# Patient Record
Sex: Male | Born: 1985 | Race: Black or African American | Hispanic: No | Marital: Married | Smoking: Former smoker
Health system: Southern US, Community
[De-identification: ages and names within clinical notes are randomized; demographics above are authoritative.]

## PROBLEM LIST (undated history)

## (undated) DIAGNOSIS — D649 Anemia, unspecified: Secondary | ICD-10-CM

## (undated) DIAGNOSIS — N186 End stage renal disease: Secondary | ICD-10-CM

## (undated) DIAGNOSIS — I639 Cerebral infarction, unspecified: Secondary | ICD-10-CM

## (undated) DIAGNOSIS — E785 Hyperlipidemia, unspecified: Secondary | ICD-10-CM

## (undated) DIAGNOSIS — I10 Essential (primary) hypertension: Secondary | ICD-10-CM

## (undated) DIAGNOSIS — F1410 Cocaine abuse, uncomplicated: Secondary | ICD-10-CM

---
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
## 2005-03-08 ENCOUNTER — Emergency Department (HOSPITAL_COMMUNITY): Admission: EM | Admit: 2005-03-08 | Discharge: 2005-03-08 | Payer: Self-pay | Admitting: Emergency Medicine

## 2014-09-12 ENCOUNTER — Encounter (HOSPITAL_COMMUNITY): Payer: Self-pay | Admitting: Emergency Medicine

## 2014-09-12 ENCOUNTER — Emergency Department (HOSPITAL_COMMUNITY): Payer: Self-pay

## 2014-09-12 ENCOUNTER — Emergency Department (HOSPITAL_COMMUNITY)
Admission: EM | Admit: 2014-09-12 | Discharge: 2014-09-12 | Disposition: A | Discharge status: Home / Self Care | Payer: Self-pay | Attending: Emergency Medicine | Admitting: Emergency Medicine

## 2014-09-12 DIAGNOSIS — W268XXA Contact with other sharp object(s), not elsewhere classified, initial encounter: Secondary | ICD-10-CM | POA: Insufficient documentation

## 2014-09-12 DIAGNOSIS — Z23 Encounter for immunization: Secondary | ICD-10-CM | POA: Insufficient documentation

## 2014-09-12 DIAGNOSIS — F17200 Nicotine dependence, unspecified, uncomplicated: Secondary | ICD-10-CM | POA: Insufficient documentation

## 2014-09-12 DIAGNOSIS — Y9389 Activity, other specified: Secondary | ICD-10-CM | POA: Insufficient documentation

## 2014-09-12 DIAGNOSIS — S61209A Unspecified open wound of unspecified finger without damage to nail, initial encounter: Secondary | ICD-10-CM | POA: Insufficient documentation

## 2014-09-12 DIAGNOSIS — F411 Generalized anxiety disorder: Secondary | ICD-10-CM | POA: Insufficient documentation

## 2014-09-12 DIAGNOSIS — S61412A Laceration without foreign body of left hand, initial encounter: Secondary | ICD-10-CM

## 2014-09-12 DIAGNOSIS — Y9289 Other specified places as the place of occurrence of the external cause: Secondary | ICD-10-CM | POA: Insufficient documentation

## 2014-09-12 DIAGNOSIS — F419 Anxiety disorder, unspecified: Secondary | ICD-10-CM

## 2014-09-12 DIAGNOSIS — R000 Tachycardia, unspecified: Secondary | ICD-10-CM | POA: Insufficient documentation

## 2014-09-12 MED ORDER — IBUPROFEN 200 MG PO TABS
600.0000 mg | ORAL_TABLET | Freq: Once | ORAL | Status: AC
  Administered 2014-09-12: 600 mg via ORAL
  Filled 2014-09-12: qty 3

## 2014-09-12 MED ORDER — LIDOCAINE HCL (PF) 1 % IJ SOLN
5.0000 mL | Freq: Once | INTRAMUSCULAR | Status: AC
  Administered 2014-09-12: 5 mL via INTRADERMAL
  Filled 2014-09-12: qty 5

## 2014-09-12 MED ORDER — TRAMADOL HCL 50 MG PO TABS
50.0000 mg | ORAL_TABLET | Freq: Once | ORAL | Status: AC
  Administered 2014-09-12: 50 mg via ORAL
  Filled 2014-09-12: qty 1

## 2014-09-12 MED ORDER — IBUPROFEN 600 MG PO TABS: 600.0000 mg | ORAL_TABLET | Freq: Four times a day (QID) | ORAL | Status: DC | PRN

## 2014-09-12 MED ORDER — TETANUS-DIPHTH-ACELL PERTUSSIS 5-2.5-18.5 LF-MCG/0.5 IM SUSP
0.5000 mL | Freq: Once | INTRAMUSCULAR | Status: AC
  Administered 2014-09-12: 0.5 mL via INTRAMUSCULAR
  Filled 2014-09-12: qty 0.5

## 2014-09-12 MED ORDER — TRAMADOL HCL 50 MG PO TABS: 50.0000 mg | ORAL_TABLET | Freq: Four times a day (QID) | ORAL | Status: DC | PRN

## 2014-09-12 NOTE — ED Notes (Signed)
Md made aware of high HR; Pt placed on cardiac monitor

## 2014-09-12 NOTE — ED Notes (Signed)
The patient's daughter broke a glass and the patient was picking up the glass and cut himself.  He was taking the trash out and he cut his pinky finger.  The patient rates his pain 9/10.  Finger is still bleeding.

## 2014-09-12 NOTE — ED Provider Notes (Signed)
LACERATION REPAIR Performed by: Cleatrice Burke Authorized by: Cleatrice Burke Consent: Verbal consent obtained. Risks and benefits: risks, benefits and alternatives were discussed Consent given by: patient Patient identity confirmed: provided demographic data Prepped and Draped in normal sterile fashion Wound explored  Laceration Location: left 5th finger  Laceration Length: 2 cm  No Foreign Bodies seen or palpated  Anesthesia: digital block  Local anesthetic: lidocaine 1%  Anesthetic total: 3 ml  Irrigation method: syringe Amount of cleaning: standard  Skin closure: 4-0 Ethilon  Number of sutures: 6  Technique: simple interrupted   Patient tolerance: Patient tolerated the procedure well with no immediate complications.   Elwyn Lade, PA-C 09/12/14 [phone]

## 2014-09-12 NOTE — ED Provider Notes (Signed)
Medical screening examination/treatment/procedure(s) were performed by non-physician practitioner and as supervising physician I was immediately available for consultation/collaboration.   EKG Interpretation None        Julianne Rice, MD 09/12/14 [phone]

## 2014-09-12 NOTE — ED Provider Notes (Signed)
CSN: [id number]     Arrival date & time 09/12/14  0159 History   First MD Initiated Contact with Patient 09/12/14 [phone]     Chief Complaint  Patient presents with  . Laceration    The patient's daughter broke a glass and the patient was picking up the glass and cut himself.     (Consider location/radiation/quality/duration/timing/severity/associated sxs/prior Treatment) HPI Patient states he was pushing down trash in the trashcan when he cut his left fifth digit on a piece of glass. No foreign body sensation. No numbness or decreased range of motion. Unknown last tetanus. Patient admits to being anxious and in pain. Denies chest pain or shortness of breath. History reviewed. No pertinent past medical history. History reviewed. No pertinent past surgical history. History reviewed. No pertinent family history. History  Substance Use Topics  . Smoking status: Current Every Day Smoker -- 1.00 packs/day  . Smokeless tobacco: Never Used  . Alcohol Use: Yes     Comment: 2-3 beers a day    Review of Systems  Constitutional: Negative for fever and chills.  Respiratory: Negative for shortness of breath.   Cardiovascular: Negative for chest pain.  Skin: Positive for wound.  Neurological: Negative for weakness and numbness.  Psychiatric/Behavioral: The patient is nervous/anxious.   All other systems reviewed and are negative.     Allergies  Review of patient's allergies indicates no known allergies.  Home Medications   Prior to Admission medications   Medication Sig Start Date End Date Taking? Authorizing Provider  ibuprofen (ADVIL,MOTRIN) 600 MG tablet Take 1 tablet (600 mg total) by mouth every 6 (six) hours as needed. 09/12/14   Julianne Rice, MD  traMADol (ULTRAM) 50 MG tablet Take 1 tablet (50 mg total) by mouth every 6 (six) hours as needed. 09/12/14   Julianne Rice, MD   BP 162/96  Pulse 122  Temp(Src) 98.6 F (37 C) (Oral)  Resp 13  SpO2 92% Physical Exam  Nursing  note and vitals reviewed. Constitutional: He is oriented to person, place, and time. He appears well-developed and well-nourished. No distress.  Anxious appearing  HENT:  Head: Normocephalic and atraumatic.  Mouth/Throat: Oropharynx is clear and moist. No oropharyngeal exudate.  Eyes: EOM are normal. Pupils are equal, round, and reactive to light.  Neck: Normal range of motion. Neck supple.  Cardiovascular: Normal rate and regular rhythm.   Pulmonary/Chest: Effort normal and breath sounds normal. No respiratory distress. He has no wheezes. He has no rales. He exhibits no tenderness.  Abdominal: Soft. Bowel sounds are normal. He exhibits no distension and no mass. There is no tenderness. There is no rebound and no guarding.  Musculoskeletal: Normal range of motion. He exhibits no edema and no tenderness.  Neurological: He is alert and oriented to person, place, and time.  No numbness. Patient has full extension and flexion of the fifth digit of the left hand.  Skin: Skin is warm and dry. No rash noted. No erythema.  Linear laceration parallel to the axis of the fifth digit. Roughly 2 cm in length and extending from the middle phalanx to the distal phalanx on the volar surface of the left hand. No foreign body appreciated. Mild oozing but no arterial bleeding  Psychiatric: He has a normal mood and affect. His behavior is normal.    ED Course  Procedures (including critical care time) Labs Review Labs Reviewed - No data to display  Imaging Review Dg Hand Complete Left  09/12/2014   CLINICAL DATA:  Laceration on the distal aspect of the little finger from a broken glass.  EXAM: LEFT HAND - COMPLETE 3+ VIEW  COMPARISON:  None.  FINDINGS: There is no evidence of fracture or dislocation. There is no evidence of arthropathy or other focal bone abnormality. Soft tissues are unremarkable. No radiopaque soft tissue foreign bodies.  IMPRESSION: Negative.   Electronically Signed   By: Lucienne Capers  M.D.   On: 09/12/2014 03:21     EKG Interpretation None      MDM   Final diagnoses:  Hand laceration, left, initial encounter  Anxiety  Tachycardia    Laceration repaired by PA. Patient continues to have full sensation and range of motion of the fifth digit of his left hand. Given thorough return precautions including signs of infection and tendon injury. He understands he needs to return immediately for any evidence of either. Tetanus update in the ED.    Julianne Rice, MD 09/12/14 [phone]

## 2014-09-12 NOTE — Discharge Instructions (Signed)
You need to be seen immediately either in the emergency department if for any reason you develope numbness or inability to extend or flex the finger that is lacerated. He also need to be seen immediately if you develop redness and warmth at the site of the injury. Your sutures need to be removed in 7 days.  Laceration Care, Adult A laceration is a cut or lesion that goes through all layers of the skin and into the tissue just beneath the skin. TREATMENT  Some lacerations may not require closure. Some lacerations may not be able to be closed due to an increased risk of infection. It is important to see your caregiver as soon as possible after an injury to minimize the risk of infection and maximize the opportunity for successful closure. If closure is appropriate, pain medicines may be given, if needed. The wound will be cleaned to help prevent infection. Your caregiver will use stitches (sutures), staples, wound glue (adhesive), or skin adhesive strips to repair the laceration. These tools bring the skin edges together to allow for faster healing and a better cosmetic outcome. However, all wounds will heal with a scar. Once the wound has healed, scarring can be minimized by covering the wound with sunscreen during the day for 1 full year. HOME CARE INSTRUCTIONS  For sutures or staples:  Keep the wound clean and dry.  If you were given a bandage (dressing), you should change it at least once a day. Also, change the dressing if it becomes wet or dirty, or as directed by your caregiver.  Wash the wound with soap and water 2 times a day. Rinse the wound off with water to remove all soap. Pat the wound dry with a clean towel.  After cleaning, apply a thin layer of the antibiotic ointment as recommended by your caregiver. This will help prevent infection and keep the dressing from sticking.  You may shower as usual after the first 24 hours. Do not soak the wound in water until the sutures are  removed.  Only take over-the-counter or prescription medicines for pain, discomfort, or fever as directed by your caregiver.  Get your sutures or staples removed as directed by your caregiver. For skin adhesive strips:  Keep the wound clean and dry.  Do not get the skin adhesive strips wet. You may bathe carefully, using caution to keep the wound dry.  If the wound gets wet, pat it dry with a clean towel.  Skin adhesive strips will fall off on their own. You may trim the strips as the wound heals. Do not remove skin adhesive strips that are still stuck to the wound. They will fall off in time. For wound adhesive:  You may briefly wet your wound in the shower or bath. Do not soak or scrub the wound. Do not swim. Avoid periods of heavy perspiration until the skin adhesive has fallen off on its own. After showering or bathing, gently pat the wound dry with a clean towel.  Do not apply liquid medicine, cream medicine, or ointment medicine to your wound while the skin adhesive is in place. This may loosen the film before your wound is healed.  If a dressing is placed over the wound, be careful not to apply tape directly over the skin adhesive. This may cause the adhesive to be pulled off before the wound is healed.  Avoid prolonged exposure to sunlight or tanning lamps while the skin adhesive is in place. Exposure to ultraviolet light in the  first year will darken the scar.  The skin adhesive will usually remain in place for 5 to 10 days, then naturally fall off the skin. Do not pick at the adhesive film. You may need a tetanus shot if:  You cannot remember when you had your last tetanus shot.  You have never had a tetanus shot. If you get a tetanus shot, your arm may swell, get red, and feel warm to the touch. This is common and not a problem. If you need a tetanus shot and you choose not to have one, there is a rare chance of getting tetanus. Sickness from tetanus can be serious. SEEK  MEDICAL CARE IF:   You have redness, swelling, or increasing pain in the wound.  You see a red line that goes away from the wound.  You have yellowish-white fluid (pus) coming from the wound.  You have a fever.  You notice a bad smell coming from the wound or dressing.  Your wound breaks open before or after sutures have been removed.  You notice something coming out of the wound such as wood or glass.  Your wound is on your hand or foot and you cannot move a finger or toe. SEEK IMMEDIATE MEDICAL CARE IF:   Your pain is not controlled with prescribed medicine.  You have severe swelling around the wound causing pain and numbness or a change in color in your arm, hand, leg, or foot.  Your wound splits open and starts bleeding.  You have worsening numbness, weakness, or loss of function of any joint around or beyond the wound.  You develop painful lumps near the wound or on the skin anywhere on your body. MAKE SURE YOU:   Understand these instructions.  Will watch your condition.  Will get help right away if you are not doing well or get worse. Document Released: 11/30/2005 Document Revised: 02/22/2012 Document Reviewed: 05/26/2011 [facility] Patient Information 2015 [place], [place]. This information is not intended to replace advice given to you by your health care provider. Make sure you discuss any questions you have with your health care provider.

## 2014-09-12 NOTE — ED Notes (Signed)
Suture cart set up at bedside

## 2017-12-17 ENCOUNTER — Other Ambulatory Visit: Payer: Self-pay

## 2017-12-17 ENCOUNTER — Encounter (HOSPITAL_COMMUNITY): Payer: Self-pay | Admitting: *Deleted

## 2017-12-17 ENCOUNTER — Emergency Department (HOSPITAL_COMMUNITY)
Admission: EM | Admit: 2017-12-17 | Discharge: 2017-12-17 | Disposition: A | Discharge status: Home / Self Care | Payer: Self-pay | Attending: Emergency Medicine | Admitting: Emergency Medicine

## 2017-12-17 DIAGNOSIS — F17200 Nicotine dependence, unspecified, uncomplicated: Secondary | ICD-10-CM | POA: Insufficient documentation

## 2017-12-17 DIAGNOSIS — M546 Pain in thoracic spine: Secondary | ICD-10-CM | POA: Insufficient documentation

## 2017-12-17 DIAGNOSIS — I10 Essential (primary) hypertension: Secondary | ICD-10-CM | POA: Insufficient documentation

## 2017-12-17 HISTORY — DX: Essential (primary) hypertension: I10

## 2017-12-17 LAB — BASIC METABOLIC PANEL
Anion gap: 10 (ref 5–15)
BUN: <5 mg/dL — ABNORMAL LOW (ref 6–20)
CO2: 27 mmol/L (ref 22–32)
Calcium: 9.4 mg/dL (ref 8.9–10.3)
Chloride: 97 mmol/L — ABNORMAL LOW (ref 101–111)
Creatinine, Ser: 1.12 mg/dL (ref 0.61–1.24)
GFR calc Af Amer: >60 mL/min (ref >60)
GFR calc non Af Amer: >60 mL/min (ref >60)
Glucose, Bld: 103 mg/dL — ABNORMAL HIGH (ref 65–99)
POTASSIUM: 3.4 mmol/L — AB (ref 3.5–5.1)
SODIUM: 134 mmol/L — AB (ref 135–145)

## 2017-12-17 LAB — URINALYSIS, ROUTINE W REFLEX MICROSCOPIC
Bacteria, UA: NONE SEEN
Bilirubin Urine: NEGATIVE
Glucose, UA: 150 mg/dL — AB
KETONES UR: NEGATIVE mg/dL
Leukocytes, UA: NEGATIVE
Nitrite: NEGATIVE
PH: 6 (ref 5.0–8.0)
Protein, ur: 100 mg/dL — AB
SQUAMOUS EPITHELIAL / LPF: NONE SEEN
Specific Gravity, Urine: 1.019 (ref 1.005–1.030)

## 2017-12-17 LAB — CBC
HEMATOCRIT: 44.7 % (ref 39.0–52.0)
HEMOGLOBIN: 15 g/dL (ref 13.0–17.0)
MCH: 32.1 pg (ref 26.0–34.0)
MCHC: 33.6 g/dL (ref 30.0–36.0)
MCV: 95.7 fL (ref 78.0–100.0)
Platelets: 269 10*3/uL (ref 150–400)
RBC: 4.67 MIL/uL (ref 4.22–5.81)
RDW: 12.3 % (ref 11.5–15.5)
WBC: 9.3 10*3/uL (ref 4.0–10.5)

## 2017-12-17 MED ORDER — ACETAMINOPHEN 500 MG PO TABS: 1000.0000 mg | ORAL_TABLET | Freq: Three times a day (TID) | ORAL | 0 refills | Status: DC | PRN

## 2017-12-17 MED ORDER — CYCLOBENZAPRINE HCL 5 MG PO TABS: 5.0000 mg | ORAL_TABLET | Freq: Two times a day (BID) | ORAL | 0 refills | Status: DC | PRN

## 2017-12-17 MED ORDER — LISINOPRIL 10 MG PO TABS: 10.0000 mg | ORAL_TABLET | Freq: Every day | ORAL | 0 refills | Status: DC

## 2017-12-17 MED ORDER — ACETAMINOPHEN 500 MG PO TABS
1000.0000 mg | ORAL_TABLET | Freq: Once | ORAL | Status: AC
Start: 2017-12-17 — End: 2017-12-17
  Administered 2017-12-17: 1000 mg via ORAL
  Filled 2017-12-17: qty 2

## 2017-12-17 MED ORDER — OXYCODONE-ACETAMINOPHEN 5-325 MG PO TABS
1.0000 | ORAL_TABLET | ORAL | Status: DC | PRN
  Administered 2017-12-17: 1 via ORAL
  Filled 2017-12-17: qty 1

## 2017-12-17 NOTE — ED Provider Notes (Signed)
[facility] EMERGENCY DEPARTMENT Provider Note   CSN: [id number] Arrival date & time: 12/17/17  0700     History   Chief Complaint Chief Complaint  Patient presents with  . Back Pain    HPI William [name] is a 32 y.o. male presenting for evaluation of back pain.  Patient states that he woke up 2 mornings ago with right-sided back pain that extends from low back to the shoulder blade.  Pain is described as a sharp shooting pain that is worse with movement.  Nothing makes it better.  He denies fall, trauma, or injury.  Twisting and movement of his right arm makes it worse.  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  He denies fevers, chills, chest pain, shortness breath, nausea, vomiting, abdominal pain, urinary symptoms, abnormal bowel movements.  He denies other red flags of back pain including history of cancer, history of IV drug use, loss of bowel or bladder control, numbness, or tingling.  He reports a similar episode several years ago, does not know what prompted it or help to go away. He has not taken anything for pain at home including tylenol or ibuprofen.  Additionally, patient reports a history of high blood pressure.  He has not been on medication for the past 2 years.  He denies vision changes, headaches, slurred speech, weakness.   HPI  Past Medical History:  Diagnosis Date  . Hypertension     There are no active problems to display for this patient.   History reviewed. No pertinent surgical history.     Home Medications    Prior to Admission medications   Medication Sig Start Date End Date Taking? Authorizing Provider  acetaminophen (TYLENOL) 500 MG tablet Take 2 tablets (1,000 mg total) by mouth every 8 (eight) hours as needed. 12/17/17   Abdoul Encinas, PA-C  cyclobenzaprine (FLEXERIL) 5 MG tablet Take 1 tablet (5 mg total) by mouth 2 (two) times daily as needed for muscle spasms.  12/17/17   Shawana Knoch, PA-C  ibuprofen (ADVIL,MOTRIN) 600 MG tablet Take 1 tablet (600 mg total) by mouth every 6 (six) hours as needed. 09/12/14   Julianne Rice, MD  lisinopril (PRINIVIL,ZESTRIL) 10 MG tablet Take 1 tablet (10 mg total) by mouth daily. 12/17/17   Kelleen Stolze, PA-C  traMADol (ULTRAM) 50 MG tablet Take 1 tablet (50 mg total) by mouth every 6 (six) hours as needed. 09/12/14   Julianne Rice, MD    Family History History reviewed. No pertinent family history.  Social History Social History   Tobacco Use  . Smoking status: Current Every Day Smoker    Packs/day: 1.00  . Smokeless tobacco: Never Used  Substance Use Topics  . Alcohol use: Yes    Comment: 2-3 beers a day  . Drug use: Yes    Types: Marijuana     Allergies   Patient has no known allergies.   Review of Systems Review of Systems  Musculoskeletal: Positive for back pain.  Allergic/Immunologic: Negative for immunocompromised state.  Neurological: Negative for numbness.  Hematological: Does not bruise/bleed easily.     Physical Exam Updated Vital Signs BP (!) 191/122 (BP Location: Right Arm)   Pulse 85   Temp 98 F (36.7 C) (Oral)   Resp 16   SpO2 100%   Physical Exam  Constitutional: He is oriented to person, place, and time. He appears well-developed and well-nourished. No distress.  HENT:  Head: Normocephalic and atraumatic.  Eyes: EOM are normal.  Neck: Normal range of motion.  Cardiovascular: Normal rate, regular rhythm and intact distal pulses.  Pulmonary/Chest: Effort normal and breath sounds normal. No respiratory distress. He has no wheezes.      Abdominal: He exhibits no distension.  Musculoskeletal: Normal range of motion. He exhibits tenderness.  Tenderness to palpation of the right-sided back musculature.  No tenderness to palpation over midline spine or left-sided musculature.  Full active range of motion of arm, neck, and back with minimal pain.  Patient is  ambulatory.  Neurological: He is alert and oriented to person, place, and time. No sensory deficit.  Skin: Skin is warm. No rash noted.  Psychiatric: He has a normal mood and affect.  Nursing note and vitals reviewed.    ED Treatments / Results  Labs (all labs ordered are listed, but only abnormal results are displayed) Labs Reviewed  URINALYSIS, ROUTINE W REFLEX MICROSCOPIC - Abnormal; Notable for the following components:      Result Value   Glucose, UA 150 (*)    Hgb urine dipstick SMALL (*)    Protein, ur 100 (*)    All other components within normal limits  BASIC METABOLIC PANEL - Abnormal; Notable for the following components:   Sodium 134 (*)    Potassium 3.4 (*)    Chloride 97 (*)    Glucose, Bld 103 (*)    BUN <5 (*)    All other components within normal limits  CBC    EKG  EKG Interpretation None       Radiology No results found.  Procedures Procedures (including critical care time)  Medications Ordered in ED Medications  acetaminophen (TYLENOL) tablet 1,000 mg (1,000 mg Oral Given 12/17/17 1026)     Initial Impression / Assessment and Plan / ED Course  I have reviewed the triage vital signs and the nursing notes.  Pertinent labs & imaging results that were available during my care of the patient were reviewed by me and considered in my medical decision making (see chart for details).     Pt presenting with R sided back pain. Physical exam with TTP of musculature, reassuring. No red flags for back pain. However, BP is elevated. Will obtain urine and BMP for further evaluation of kidneys. If normal, likely MSK pain that can be tx with NSAIDs and muscle relaxer. BP elevated, improved slightly while in ER. Likely baseline, as pt is supposed to be on medication and isn't. Has been 2 years since medication, Will encourage PCP f/u.   Urine shows small blood, no signs of infection.  Doubt kidney stone.  Creatinine reassuring.  No sign of endorgan damage due to  blood pressure at this time. Will give low dose lisinopril until he can f/u with PCP. Discussed findings with patient.  Will treat for muscular pain.  Patient given information for follow-up for primary care.  Patient told to avoid NSAIDs due to blood pressure.  At this time, patient appears safe for discharge.  Return precautions given.  Patient states he understands and agrees to plan.   Final Clinical Impressions(s) / ED Diagnoses   Final diagnoses:  Acute right-sided thoracic back pain  Hypertension, unspecified type    ED Discharge Orders        Ordered    acetaminophen (TYLENOL) 500 MG tablet  Every 8 hours PRN     12/17/17 1145    cyclobenzaprine (FLEXERIL) 5 MG tablet  2 times daily PRN  12/17/17 1145    lisinopril (PRINIVIL,ZESTRIL) 10 MG tablet  Daily     12/17/17 1145       Tavin Vernet, PA-C 12/17/17 1528    Isla Pence, MD 12/17/17 [phone]

## 2017-12-17 NOTE — Discharge Instructions (Signed)
Take Tylenol 3 times a day as needed for pain.  You may use 2 ibuprofen as needed for severe or breakthrough pain. Take muscle relaxer as needed at night for muscle spasms.  Have caution, as this may make you tired or groggy.  Do not drive or operate heavy machinery while taking this medicine. Take lisinopril once a day.  If you are feeling dizzy or weak, stop taking medication and follow-up with primary care. It is very important that you establish care with a primary care doctor.  I have provided information for [place] and wellness, or you may call the [phone] number the back of the paperwork to help you set up primary care. Return to the emergency room if you develop numbness, loss of bowel or bladder control, fevers, slurred speech, vision changes, chest pain, or any new or concerning symptoms.

## 2017-12-17 NOTE — ED Notes (Signed)
Patient states that at one time he was on lisinopril for his blood pressure. He has not had any bp meds in a while. Pt is extremely hypertensive upon arrival to dept. Patient educated about consequences of unmanaged blood pressure. Nursing to provide resources upon discharge for primary care md.

## 2017-12-17 NOTE — ED Triage Notes (Addendum)
Pt woke up with mid right side back pain two days ago. Increases with movement. Unsure of any injury. Ambulatory at triage.hypertensive at triage, reports hx of same but is out of his meds.

## 2019-06-14 DIAGNOSIS — I639 Cerebral infarction, unspecified: Secondary | ICD-10-CM

## 2019-06-14 HISTORY — DX: Cerebral infarction, unspecified: I63.9

## 2019-06-21 ENCOUNTER — Encounter (HOSPITAL_COMMUNITY): Payer: Self-pay

## 2019-06-21 ENCOUNTER — Inpatient Hospital Stay (HOSPITAL_COMMUNITY)
Admission: EM | Admit: 2019-06-21 | Discharge: 2019-06-29 | DRG: 064 | Disposition: A | Discharge status: Rehabilitation Facility | Payer: Medicaid Other | Attending: Internal Medicine | Admitting: Internal Medicine

## 2019-06-21 ENCOUNTER — Emergency Department (HOSPITAL_COMMUNITY): Payer: Medicaid Other

## 2019-06-21 ENCOUNTER — Other Ambulatory Visit: Payer: Self-pay

## 2019-06-21 DIAGNOSIS — I119 Hypertensive heart disease without heart failure: Secondary | ICD-10-CM | POA: Diagnosis present

## 2019-06-21 DIAGNOSIS — I161 Hypertensive emergency: Secondary | ICD-10-CM

## 2019-06-21 DIAGNOSIS — I472 Ventricular tachycardia: Secondary | ICD-10-CM | POA: Diagnosis present

## 2019-06-21 DIAGNOSIS — Z20828 Contact with and (suspected) exposure to other viral communicable diseases: Secondary | ICD-10-CM | POA: Diagnosis present

## 2019-06-21 DIAGNOSIS — I6330 Cerebral infarction due to thrombosis of unspecified cerebral artery: Secondary | ICD-10-CM | POA: Insufficient documentation

## 2019-06-21 DIAGNOSIS — I6783 Posterior reversible encephalopathy syndrome: Secondary | ICD-10-CM | POA: Diagnosis present

## 2019-06-21 DIAGNOSIS — E785 Hyperlipidemia, unspecified: Secondary | ICD-10-CM | POA: Diagnosis present

## 2019-06-21 DIAGNOSIS — F1310 Sedative, hypnotic or anxiolytic abuse, uncomplicated: Secondary | ICD-10-CM | POA: Diagnosis present

## 2019-06-21 DIAGNOSIS — K5900 Constipation, unspecified: Secondary | ICD-10-CM | POA: Diagnosis present

## 2019-06-21 DIAGNOSIS — I674 Hypertensive encephalopathy: Secondary | ICD-10-CM | POA: Diagnosis present

## 2019-06-21 DIAGNOSIS — Z79891 Long term (current) use of opiate analgesic: Secondary | ICD-10-CM

## 2019-06-21 DIAGNOSIS — Z888 Allergy status to other drugs, medicaments and biological substances status: Secondary | ICD-10-CM

## 2019-06-21 DIAGNOSIS — Z79899 Other long term (current) drug therapy: Secondary | ICD-10-CM

## 2019-06-21 DIAGNOSIS — F1210 Cannabis abuse, uncomplicated: Secondary | ICD-10-CM | POA: Diagnosis present

## 2019-06-21 DIAGNOSIS — I6389 Other cerebral infarction: Principal | ICD-10-CM | POA: Diagnosis present

## 2019-06-21 DIAGNOSIS — E876 Hypokalemia: Secondary | ICD-10-CM | POA: Diagnosis present

## 2019-06-21 DIAGNOSIS — I248 Other forms of acute ischemic heart disease: Secondary | ICD-10-CM | POA: Diagnosis present

## 2019-06-21 DIAGNOSIS — G936 Cerebral edema: Secondary | ICD-10-CM | POA: Diagnosis present

## 2019-06-21 DIAGNOSIS — I10 Essential (primary) hypertension: Secondary | ICD-10-CM

## 2019-06-21 DIAGNOSIS — I313 Pericardial effusion (noninflammatory): Secondary | ICD-10-CM | POA: Diagnosis present

## 2019-06-21 DIAGNOSIS — G8194 Hemiplegia, unspecified affecting left nondominant side: Secondary | ICD-10-CM | POA: Diagnosis present

## 2019-06-21 DIAGNOSIS — H5347 Heteronymous bilateral field defects: Secondary | ICD-10-CM | POA: Diagnosis present

## 2019-06-21 DIAGNOSIS — E871 Hypo-osmolality and hyponatremia: Secondary | ICD-10-CM | POA: Diagnosis present

## 2019-06-21 DIAGNOSIS — F1410 Cocaine abuse, uncomplicated: Secondary | ICD-10-CM | POA: Diagnosis present

## 2019-06-21 DIAGNOSIS — Z9119 Patient's noncompliance with other medical treatment and regimen: Secondary | ICD-10-CM

## 2019-06-21 DIAGNOSIS — D649 Anemia, unspecified: Secondary | ICD-10-CM | POA: Diagnosis present

## 2019-06-21 DIAGNOSIS — N179 Acute kidney failure, unspecified: Secondary | ICD-10-CM

## 2019-06-21 DIAGNOSIS — N280 Ischemia and infarction of kidney: Secondary | ICD-10-CM | POA: Diagnosis present

## 2019-06-21 DIAGNOSIS — F17210 Nicotine dependence, cigarettes, uncomplicated: Secondary | ICD-10-CM | POA: Diagnosis present

## 2019-06-21 DIAGNOSIS — I160 Hypertensive urgency: Secondary | ICD-10-CM | POA: Diagnosis present

## 2019-06-21 HISTORY — DX: Hyperlipidemia, unspecified: E78.5

## 2019-06-21 MED ORDER — HYDRALAZINE HCL 20 MG/ML IJ SOLN
10.0000 mg | Freq: Once | INTRAMUSCULAR | Status: AC
  Administered 2019-06-21: 10 mg via INTRAVENOUS
  Filled 2019-06-21: qty 1

## 2019-06-21 NOTE — ED Notes (Signed)
Pt is concerned about his blood pressure. Pt states it has never been this high (254/167)

## 2019-06-21 NOTE — ED Notes (Signed)
Pt stated that he is out of his BP medicine but is on meds for it

## 2019-06-21 NOTE — ED Provider Notes (Signed)
TIME SEEN: 11:30 PM  CHIEF COMPLAINT: Chest pain, shortness of breath, abdominal discomfort  HPI: Patient is a 33 year old male with history of hypertension who was previously on lisinopril and a second antihypertensive medication but states he has not been taking them for over 6 months who presents today with mild chest tightness, shortness of breath for the past several days.  Has had some intermittent headaches that have been very mild but no headache currently.  No blurry vision.  No numbness or weakness.  Patient is extremely hypertensive here.  States his blood pressure has never been this elevated.  ROS: See HPI Constitutional: no fever  Eyes: no drainage  ENT: no runny nose   Cardiovascular:   chest pain  Resp:  SOB  GI: no vomiting GU: no dysuria Integumentary: no rash  Allergy: no hives  Musculoskeletal: no leg swelling  Neurological: no slurred speech ROS otherwise negative  PAST MEDICAL HISTORY/PAST SURGICAL HISTORY:  Past Medical History:  Diagnosis Date  . Hypertension     MEDICATIONS:  Prior to Admission medications   Medication Sig Start Date End Date Taking? Authorizing Provider  acetaminophen (TYLENOL) 500 MG tablet Take 2 tablets (1,000 mg total) by mouth every 8 (eight) hours as needed. 12/17/17   Caccavale, Sophia, PA-C  cyclobenzaprine (FLEXERIL) 5 MG tablet Take 1 tablet (5 mg total) by mouth 2 (two) times daily as needed for muscle spasms. 12/17/17   Caccavale, Sophia, PA-C  ibuprofen (ADVIL,MOTRIN) 600 MG tablet Take 1 tablet (600 mg total) by mouth every 6 (six) hours as needed. 09/12/14   Julianne Rice, MD  lisinopril (PRINIVIL,ZESTRIL) 10 MG tablet Take 1 tablet (10 mg total) by mouth daily. 12/17/17   Caccavale, Sophia, PA-C  traMADol (ULTRAM) 50 MG tablet Take 1 tablet (50 mg total) by mouth every 6 (six) hours as needed. 09/12/14   Julianne Rice, MD    ALLERGIES:  No Known Allergies  SOCIAL HISTORY:  Social History   Tobacco Use  . Smoking  status: Current Every Day Smoker    Packs/day: 1.00  . Smokeless tobacco: Never Used  Substance Use Topics  . Alcohol use: Yes    Comment: 2-3 beers a day    FAMILY HISTORY: No family history on file.  EXAM: BP (!) 237/171 Comment: left arm   Pulse (!) 101 Comment: left arm   Temp 98.5 F (36.9 C) (Oral)   Resp 17 Comment: left arm   Ht 5\' 9"  (1.753 m)   Wt 99.8 kg   SpO2 100% Comment: left arm   BMI 32.49 kg/m  CONSTITUTIONAL: Alert and oriented and responds appropriately to questions. Well-appearing; well-nourished HEAD: Normocephalic EYES: Conjunctivae clear, pupils appear equal, EOMI ENT: normal nose; moist mucous membranes NECK: Supple, no meningismus, no nuchal rigidity, no LAD  CARD: RRR; S1 and S2 appreciated; no murmurs, no clicks, no rubs, no gallops RESP: Normal chest excursion without splinting or tachypnea; breath sounds clear and equal bilaterally; no wheezes, no rhonchi, no rales, no hypoxia or respiratory distress, speaking full sentences ABD/GI: Normal bowel sounds; non-distended; soft, non-tender, no rebound, no guarding, no peritoneal signs, no hepatosplenomegaly BACK:  The back appears normal and is non-tender to palpation, there is no CVA tenderness EXT: Normal ROM in all joints; non-tender to palpation; no edema; normal capillary refill; no cyanosis, no calf tenderness or swelling    SKIN: Normal color for age and race; warm; no rash NEURO: Moves all extremities equally sensation to light touch intact diffusely, cranial nerves II  through XII intact, normal speech  pSYCH: The patient's mood and manner are appropriate. Grooming and personal hygiene are appropriate.  MEDICAL DECISION MAKING: Patient here with likely hypertensive urgency/emergency.  Chest x-ray shows normal cardiac silhouette and no widened mediastinum.  EKG does show ischemic abnormalities in the inferior leads with no old for comparison.  Will give IV hydralazine.  Patient is high risk for  dissection, aneurysm, stroke, ACS.  Doubt PE.  Will obtain cardiac labs.  I feel he will need a CT scan to rule out dissection.  He denies any headache or neurologic deficits at this time.  I feel patient will need admission.  ED PROGRESS: IV hydralazine as needed has not significantly improved patient's blood pressure.  I feel we will need to lower his blood pressure by at least 97% to a systolic of 026.  Will start on IV Cardene drip.  He does endorse recent cocaine use.  At this time will avoid beta-blockers.  He is complaining of headache currently.  Will give Reglan, Benadryl for symptomatic relief and obtain CT of his head.  He denies any other stimulant use including methamphetamine.  Patient's creatinine is elevated at 4.95.  Troponin also slightly elevated at 44.  Unable to obtain a dissection study at this time given his significantly elevated creatinine.  I worry that if he receives contrast, he will end up on dialysis.  We have had lengthy discussion regarding this and he agrees he would like to hold off on CT imaging.  He states his chest pain is only mild tightness and no pain that goes into his back or abdomen.  He denies any tearing sensation.  He has normal neurologic exam and equal pulses in all extremities and equal blood pressures on both arms.  Will work on aggressive blood pressure control and obtain CT of the head.  Will discuss with ICU for admission.  2:23 AM  Discussed with Dr. Jimmy Footman with [facility].  Critical care team will see patient in the ED.  He is tachycardic here but given recent cocaine use, avoiding beta blockers.  Blood pressure improving with IV Cardene drip.   Patient has become very restless after given IV Reglan for headache.  Suspect akathisia.  Will give IV Ativan.  Head CT shows no acute hemorrhage.  He does have patchy areas of low density in the bilateral caudate, periventricular white matter and right thalamus that are nonspecific.  He has no focal neurologic  deficits at this time.  Radiology recommended MRI to be considered for further evaluation.  I do not feel he needs this emergently and can be ordered as needed by the admitting team.   I reviewed all nursing notes, vitals, pertinent previous records, EKGs, lab and urine results, imaging (as available).   EKG Interpretation  Date/Time:  Wednesday June 21 2019 16:56:44 EDT Ventricular Rate:  112 PR Interval:  146 QRS Duration: 82 QT Interval:  334 QTC Calculation: 455 R Axis:   93 Text Interpretation:  Sinus tachycardia Right atrial enlargement Rightward axis Left ventricular hypertrophy ST & T wave abnormality, consider inferior ischemia Abnormal ECG No old tracing to compare Confirmed by Deno Etienne [phone]) on 06/21/2019 10:27:29 PM        CRITICAL CARE Performed by: Cyril Mourning Travoris Bushey   Total critical care time: 65 minutes  Critical care time was exclusive of separately billable procedures and treating other patients.  Critical care was necessary to treat or prevent imminent or life-threatening deterioration.  Critical care  was time spent personally by me on the following activities: development of treatment plan with patient and/or surrogate as well as nursing, discussions with consultants, evaluation of patient's response to treatment, examination of patient, obtaining history from patient or surrogate, ordering and performing treatments and interventions, ordering and review of laboratory studies, ordering and review of radiographic studies, pulse oximetry and re-evaluation of patient's condition.    Dayannara Pascal, Delice Bison, DO 06/22/19 [phone]

## 2019-06-21 NOTE — ED Triage Notes (Signed)
Pt reports multiple complaints, SOB,constipation, unable to eat for the past 3 days. Denies fever/cough. Pt a.o, nad noted.

## 2019-06-22 ENCOUNTER — Inpatient Hospital Stay (HOSPITAL_COMMUNITY): Payer: Medicaid Other

## 2019-06-22 ENCOUNTER — Emergency Department (HOSPITAL_COMMUNITY): Payer: Medicaid Other

## 2019-06-22 ENCOUNTER — Encounter (HOSPITAL_COMMUNITY): Payer: Self-pay | Admitting: Radiology

## 2019-06-22 DIAGNOSIS — F1910 Other psychoactive substance abuse, uncomplicated: Secondary | ICD-10-CM | POA: Diagnosis not present

## 2019-06-22 DIAGNOSIS — I160 Hypertensive urgency: Secondary | ICD-10-CM | POA: Diagnosis present

## 2019-06-22 DIAGNOSIS — I639 Cerebral infarction, unspecified: Secondary | ICD-10-CM

## 2019-06-22 DIAGNOSIS — I313 Pericardial effusion (noninflammatory): Secondary | ICD-10-CM | POA: Diagnosis present

## 2019-06-22 DIAGNOSIS — N280 Ischemia and infarction of kidney: Secondary | ICD-10-CM | POA: Diagnosis present

## 2019-06-22 DIAGNOSIS — I161 Hypertensive emergency: Secondary | ICD-10-CM | POA: Diagnosis not present

## 2019-06-22 DIAGNOSIS — I674 Hypertensive encephalopathy: Secondary | ICD-10-CM | POA: Diagnosis present

## 2019-06-22 DIAGNOSIS — F1210 Cannabis abuse, uncomplicated: Secondary | ICD-10-CM | POA: Diagnosis present

## 2019-06-22 DIAGNOSIS — N179 Acute kidney failure, unspecified: Secondary | ICD-10-CM

## 2019-06-22 DIAGNOSIS — R0602 Shortness of breath: Secondary | ICD-10-CM | POA: Diagnosis not present

## 2019-06-22 DIAGNOSIS — F1410 Cocaine abuse, uncomplicated: Secondary | ICD-10-CM | POA: Diagnosis present

## 2019-06-22 DIAGNOSIS — K5900 Constipation, unspecified: Secondary | ICD-10-CM | POA: Diagnosis present

## 2019-06-22 DIAGNOSIS — I6330 Cerebral infarction due to thrombosis of unspecified cerebral artery: Secondary | ICD-10-CM | POA: Diagnosis not present

## 2019-06-22 DIAGNOSIS — F1310 Sedative, hypnotic or anxiolytic abuse, uncomplicated: Secondary | ICD-10-CM | POA: Diagnosis present

## 2019-06-22 DIAGNOSIS — E871 Hypo-osmolality and hyponatremia: Secondary | ICD-10-CM | POA: Diagnosis present

## 2019-06-22 DIAGNOSIS — I6389 Other cerebral infarction: Secondary | ICD-10-CM | POA: Diagnosis present

## 2019-06-22 DIAGNOSIS — I10 Essential (primary) hypertension: Secondary | ICD-10-CM | POA: Diagnosis not present

## 2019-06-22 DIAGNOSIS — H5347 Heteronymous bilateral field defects: Secondary | ICD-10-CM | POA: Diagnosis present

## 2019-06-22 DIAGNOSIS — D649 Anemia, unspecified: Secondary | ICD-10-CM | POA: Diagnosis present

## 2019-06-22 DIAGNOSIS — I472 Ventricular tachycardia: Secondary | ICD-10-CM | POA: Diagnosis present

## 2019-06-22 DIAGNOSIS — E876 Hypokalemia: Secondary | ICD-10-CM | POA: Diagnosis present

## 2019-06-22 DIAGNOSIS — I119 Hypertensive heart disease without heart failure: Secondary | ICD-10-CM | POA: Diagnosis present

## 2019-06-22 DIAGNOSIS — G936 Cerebral edema: Secondary | ICD-10-CM | POA: Diagnosis present

## 2019-06-22 DIAGNOSIS — I248 Other forms of acute ischemic heart disease: Secondary | ICD-10-CM | POA: Diagnosis present

## 2019-06-22 DIAGNOSIS — G8194 Hemiplegia, unspecified affecting left nondominant side: Secondary | ICD-10-CM | POA: Diagnosis present

## 2019-06-22 DIAGNOSIS — E785 Hyperlipidemia, unspecified: Secondary | ICD-10-CM | POA: Diagnosis present

## 2019-06-22 DIAGNOSIS — I6783 Posterior reversible encephalopathy syndrome: Secondary | ICD-10-CM | POA: Diagnosis present

## 2019-06-22 DIAGNOSIS — F17210 Nicotine dependence, cigarettes, uncomplicated: Secondary | ICD-10-CM | POA: Diagnosis present

## 2019-06-22 DIAGNOSIS — Z20828 Contact with and (suspected) exposure to other viral communicable diseases: Secondary | ICD-10-CM | POA: Diagnosis present

## 2019-06-22 LAB — SODIUM, URINE, RANDOM: Sodium, Ur: <10 mmol/L

## 2019-06-22 LAB — ECHOCARDIOGRAM COMPLETE
Height: 69 in
Weight: 2888.91 oz

## 2019-06-22 LAB — HIV ANTIBODY (ROUTINE TESTING W REFLEX): HIV Screen 4th Generation wRfx: NONREACTIVE

## 2019-06-22 LAB — CBC
HCT: 41.6 % (ref 39.0–52.0)
HCT: 41.6 % (ref 39.0–52.0)
Hemoglobin: 14.8 g/dL (ref 13.0–17.0)
Hemoglobin: 14.9 g/dL (ref 13.0–17.0)
MCH: 31.2 pg (ref 26.0–34.0)
MCH: 31.8 pg (ref 26.0–34.0)
MCHC: 35.6 g/dL (ref 30.0–36.0)
MCHC: 35.8 g/dL (ref 30.0–36.0)
MCV: 87.6 fL (ref 80.0–100.0)
MCV: 88.7 fL (ref 80.0–100.0)
Platelets: 210 10*3/uL (ref 150–400)
Platelets: 236 10*3/uL (ref 150–400)
RBC: 4.69 MIL/uL (ref 4.22–5.81)
RBC: 4.75 MIL/uL (ref 4.22–5.81)
RDW: 11.9 % (ref 11.5–15.5)
RDW: 12 % (ref 11.5–15.5)
WBC: 12.8 10*3/uL — ABNORMAL HIGH (ref 4.0–10.5)
WBC: 16.4 10*3/uL — ABNORMAL HIGH (ref 4.0–10.5)
nRBC: 0 % (ref 0.0–0.2)
nRBC: 0 % (ref 0.0–0.2)

## 2019-06-22 LAB — COMPREHENSIVE METABOLIC PANEL
ALT: 15 U/L (ref 0–44)
AST: 21 U/L (ref 15–41)
Albumin: 3.4 g/dL — ABNORMAL LOW (ref 3.5–5.0)
Alkaline Phosphatase: 81 U/L (ref 38–126)
Anion gap: 13 (ref 5–15)
BUN: 25 mg/dL — ABNORMAL HIGH (ref 6–20)
CO2: 27 mmol/L (ref 22–32)
Calcium: 9.1 mg/dL (ref 8.9–10.3)
Chloride: 93 mmol/L — ABNORMAL LOW (ref 98–111)
Creatinine, Ser: 4.95 mg/dL — ABNORMAL HIGH (ref 0.61–1.24)
GFR calc Af Amer: 16 mL/min — ABNORMAL LOW (ref >60)
GFR calc non Af Amer: 14 mL/min — ABNORMAL LOW (ref >60)
Glucose, Bld: 140 mg/dL — ABNORMAL HIGH (ref 70–99)
Potassium: 2.8 mmol/L — ABNORMAL LOW (ref 3.5–5.1)
Sodium: 133 mmol/L — ABNORMAL LOW (ref 135–145)
Total Bilirubin: 1.1 mg/dL (ref 0.3–1.2)
Total Protein: 7.2 g/dL (ref 6.5–8.1)

## 2019-06-22 LAB — RAPID URINE DRUG SCREEN, HOSP PERFORMED
Amphetamines: NOT DETECTED
Barbiturates: NOT DETECTED
Benzodiazepines: POSITIVE — AB
Cocaine: POSITIVE — AB
Opiates: NOT DETECTED
Tetrahydrocannabinol: POSITIVE — AB

## 2019-06-22 LAB — PROCALCITONIN: Procalcitonin: 0.31 ng/mL

## 2019-06-22 LAB — SARS CORONAVIRUS 2 BY RT PCR (HOSPITAL ORDER, PERFORMED IN ~~LOC~~ HOSPITAL LAB): SARS Coronavirus 2: NEGATIVE

## 2019-06-22 LAB — GLUCOSE, CAPILLARY: Glucose-Capillary: 119 mg/dL — ABNORMAL HIGH (ref 70–99)

## 2019-06-22 LAB — TROPONIN I (HIGH SENSITIVITY)
Troponin I (High Sensitivity): 44 ng/L — ABNORMAL HIGH (ref <18)
Troponin I (High Sensitivity): 78 ng/L — ABNORMAL HIGH (ref <18)
Troponin I (High Sensitivity): 606 ng/L (ref <18)
Troponin I (High Sensitivity): 3194 ng/L (ref <18)
Troponin I (High Sensitivity): 4190 ng/L (ref <18)
Troponin I (High Sensitivity): 5222 ng/L (ref <18)
Troponin I (High Sensitivity): 6239 ng/L (ref <18)
Troponin I (High Sensitivity): 4731 ng/L (ref <18)

## 2019-06-22 LAB — BASIC METABOLIC PANEL
Anion gap: 16 — ABNORMAL HIGH (ref 5–15)
Anion gap: 13 (ref 5–15)
BUN: 27 mg/dL — ABNORMAL HIGH (ref 6–20)
BUN: 29 mg/dL — ABNORMAL HIGH (ref 6–20)
CO2: 23 mmol/L (ref 22–32)
CO2: 24 mmol/L (ref 22–32)
Calcium: 9.1 mg/dL (ref 8.9–10.3)
Calcium: 9.1 mg/dL (ref 8.9–10.3)
Chloride: 93 mmol/L — ABNORMAL LOW (ref 98–111)
Chloride: 97 mmol/L — ABNORMAL LOW (ref 98–111)
Creatinine, Ser: 5.29 mg/dL — ABNORMAL HIGH (ref 0.61–1.24)
Creatinine, Ser: 5.43 mg/dL — ABNORMAL HIGH (ref 0.61–1.24)
GFR calc Af Amer: 15 mL/min — ABNORMAL LOW (ref >60)
GFR calc Af Amer: 15 mL/min — ABNORMAL LOW (ref >60)
GFR calc non Af Amer: 13 mL/min — ABNORMAL LOW (ref >60)
GFR calc non Af Amer: 13 mL/min — ABNORMAL LOW (ref >60)
Glucose, Bld: 141 mg/dL — ABNORMAL HIGH (ref 70–99)
Glucose, Bld: 112 mg/dL — ABNORMAL HIGH (ref 70–99)
Potassium: 2.5 mmol/L — CL (ref 3.5–5.1)
Potassium: 3.2 mmol/L — ABNORMAL LOW (ref 3.5–5.1)
Sodium: 132 mmol/L — ABNORMAL LOW (ref 135–145)
Sodium: 134 mmol/L — ABNORMAL LOW (ref 135–145)

## 2019-06-22 LAB — URINALYSIS, ROUTINE W REFLEX MICROSCOPIC
Bilirubin Urine: NEGATIVE
Glucose, UA: 50 mg/dL — AB
Ketones, ur: NEGATIVE mg/dL
Leukocytes,Ua: NEGATIVE
Nitrite: NEGATIVE
Protein, ur: >=300 mg/dL — AB
Specific Gravity, Urine: 1.012 (ref 1.005–1.030)
pH: 5 (ref 5.0–8.0)

## 2019-06-22 LAB — PHOSPHORUS: Phosphorus: 3.5 mg/dL (ref 2.5–4.6)

## 2019-06-22 LAB — CREATININE, URINE, RANDOM: Creatinine, Urine: 195.54 mg/dL

## 2019-06-22 LAB — MAGNESIUM: Magnesium: 2.3 mg/dL (ref 1.7–2.4)

## 2019-06-22 LAB — MRSA PCR SCREENING: MRSA by PCR: NEGATIVE

## 2019-06-22 LAB — C-REACTIVE PROTEIN: CRP: 2.3 mg/dL — ABNORMAL HIGH (ref <1.0)

## 2019-06-22 MED ORDER — HYDRALAZINE HCL 20 MG/ML IJ SOLN
10.0000 mg | INTRAMUSCULAR | Status: DC | PRN
  Administered 2019-06-22 – 2019-06-28 (×10): 10 mg via INTRAVENOUS
  Filled 2019-06-22 (×11): qty 1

## 2019-06-22 MED ORDER — CHLORHEXIDINE GLUCONATE CLOTH 2 % EX PADS
6.0000 | MEDICATED_PAD | Freq: Every day | CUTANEOUS | Status: DC
  Administered 2019-06-24 – 2019-06-27 (×4): 6 via TOPICAL

## 2019-06-22 MED ORDER — POTASSIUM CHLORIDE CRYS ER 20 MEQ PO TBCR
40.0000 meq | EXTENDED_RELEASE_TABLET | Freq: Once | ORAL | Status: AC
  Administered 2019-06-22: 40 meq via ORAL
  Filled 2019-06-22: qty 2

## 2019-06-22 MED ORDER — HALOPERIDOL LACTATE 5 MG/ML IJ SOLN
2.0000 mg | Freq: Four times a day (QID) | INTRAMUSCULAR | Status: DC | PRN
  Administered 2019-06-23: 2 mg via INTRAVENOUS
  Filled 2019-06-22 (×2): qty 1

## 2019-06-22 MED ORDER — LACTATED RINGERS IV BOLUS
1000.0000 mL | Freq: Once | INTRAVENOUS | Status: AC
  Administered 2019-06-22: 1000 mL via INTRAVENOUS

## 2019-06-22 MED ORDER — NICARDIPINE HCL IN NACL 40-0.83 MG/200ML-% IV SOLN
3.0000 mg/h | INTRAVENOUS | Status: DC
  Administered 2019-06-22: 8 mg/h via INTRAVENOUS
  Administered 2019-06-22: 15 mg/h via INTRAVENOUS
  Administered 2019-06-22: 12 mg/h via INTRAVENOUS
  Filled 2019-06-22 (×4): qty 200

## 2019-06-22 MED ORDER — ASPIRIN 81 MG PO CHEW
81.0000 mg | CHEWABLE_TABLET | Freq: Every day | ORAL | Status: DC
  Administered 2019-06-23 – 2019-06-29 (×7): 81 mg via ORAL
  Filled 2019-06-22 (×7): qty 1

## 2019-06-22 MED ORDER — HEPARIN BOLUS VIA INFUSION
4000.0000 [IU] | Freq: Once | INTRAVENOUS | Status: DC
  Filled 2019-06-22: qty 4000

## 2019-06-22 MED ORDER — LORAZEPAM 2 MG/ML IJ SOLN
1.0000 mg | Freq: Once | INTRAMUSCULAR | Status: AC
  Administered 2019-06-22: 1 mg via INTRAVENOUS

## 2019-06-22 MED ORDER — ASPIRIN 325 MG PO TABS
325.0000 mg | ORAL_TABLET | Freq: Once | ORAL | Status: AC
  Administered 2019-06-22: 325 mg via ORAL
  Filled 2019-06-22: qty 1

## 2019-06-22 MED ORDER — MIDAZOLAM HCL 2 MG/2ML IJ SOLN
INTRAMUSCULAR | Status: AC
  Administered 2019-06-22: 2 mg via INTRAVENOUS
  Filled 2019-06-22: qty 2

## 2019-06-22 MED ORDER — NICARDIPINE HCL IN NACL 20-0.86 MG/200ML-% IV SOLN
3.0000 mg/h | INTRAVENOUS | Status: DC
  Administered 2019-06-22: 15 mg/h via INTRAVENOUS
  Administered 2019-06-22 (×2): 5 mg/h via INTRAVENOUS
  Filled 2019-06-22 (×3): qty 200

## 2019-06-22 MED ORDER — LORAZEPAM 2 MG/ML IJ SOLN
1.0000 mg | Freq: Once | INTRAMUSCULAR | Status: AC
  Administered 2019-06-22: 1 mg via INTRAVENOUS
  Filled 2019-06-22: qty 1

## 2019-06-22 MED ORDER — POTASSIUM CHLORIDE CRYS ER 20 MEQ PO TBCR
40.0000 meq | EXTENDED_RELEASE_TABLET | Freq: Two times a day (BID) | ORAL | Status: AC
  Administered 2019-06-22 (×2): 40 meq via ORAL
  Filled 2019-06-22 (×2): qty 2

## 2019-06-22 MED ORDER — HEPARIN (PORCINE) 25000 UT/250ML-% IV SOLN: 1300.0000 [IU]/h | INTRAVENOUS | Status: DC

## 2019-06-22 MED ORDER — POTASSIUM CHLORIDE CRYS ER 20 MEQ PO TBCR: 20.0000 meq | EXTENDED_RELEASE_TABLET | Freq: Once | ORAL | Status: DC

## 2019-06-22 MED ORDER — AMLODIPINE BESYLATE 10 MG PO TABS: 10.0000 mg | ORAL_TABLET | Freq: Every day | ORAL | Status: DC

## 2019-06-22 MED ORDER — STROKE: EARLY STAGES OF RECOVERY BOOK
Freq: Once | Status: AC
  Administered 2019-06-23: 21:00:00
  Filled 2019-06-22 (×2): qty 1

## 2019-06-22 MED ORDER — METOCLOPRAMIDE HCL 5 MG/ML IJ SOLN
10.0000 mg | Freq: Once | INTRAMUSCULAR | Status: AC
  Administered 2019-06-22: 10 mg via INTRAVENOUS
  Filled 2019-06-22: qty 2

## 2019-06-22 MED ORDER — LACTATED RINGERS IV SOLN
INTRAVENOUS | Status: DC
  Administered 2019-06-22 (×2): via INTRAVENOUS

## 2019-06-22 MED ORDER — LORAZEPAM 2 MG/ML IJ SOLN
INTRAMUSCULAR | Status: AC
  Administered 2019-06-22: 1 mg
  Filled 2019-06-22: qty 1

## 2019-06-22 MED ORDER — MIDAZOLAM HCL 2 MG/2ML IJ SOLN
2.0000 mg | Freq: Once | INTRAMUSCULAR | Status: AC
  Administered 2019-06-22: 2 mg via INTRAVENOUS

## 2019-06-22 MED ORDER — AMLODIPINE BESYLATE 5 MG PO TABS
5.0000 mg | ORAL_TABLET | Freq: Every day | ORAL | Status: DC
  Administered 2019-06-22: 5 mg via ORAL
  Filled 2019-06-22: qty 1

## 2019-06-22 MED ORDER — DIPHENHYDRAMINE HCL 50 MG/ML IJ SOLN
25.0000 mg | Freq: Once | INTRAMUSCULAR | Status: AC
  Administered 2019-06-22: 25 mg via INTRAVENOUS
  Filled 2019-06-22: qty 1

## 2019-06-22 MED ORDER — HYDRALAZINE HCL 20 MG/ML IJ SOLN
10.0000 mg | Freq: Once | INTRAMUSCULAR | Status: AC
  Administered 2019-06-22: 10 mg via INTRAVENOUS
  Filled 2019-06-22: qty 1

## 2019-06-22 NOTE — Progress Notes (Signed)
ANTICOAGULATION CONSULT NOTE - Initial Consult  Pharmacy Consult for heparin Indication: chest pain/ACS  Allergies  Allergen Reactions  . Reglan [Metoclopramide]     Develops akathisia    Patient Measurements: Height: 5\' 9"  (175.3 cm) Weight: 220 lb (99.8 kg) IBW/kg (Calculated) : 70.7 Heparin Dosing Weight: 91.8 kg  Vital Signs: Temp: 98.5 F (36.9 C) (07/08 2154) Temp Source: Oral (07/08 2154) BP: 149/90 (07/09 0320) Pulse Rate: 118 (07/09 0320)  Labs: Recent Labs    06/21/19 2354 06/22/19 0223  HGB 14.9  --   HCT 41.6  --   PLT 210  --   CREATININE 4.95*  --   TROPONINIHS 44* 78*    Estimated Creatinine Clearance: 24.5 mL/min (A) (by C-G formula based on SCr of 4.95 mg/dL (H)).   Medical History: Past Medical History:  Diagnosis Date  . Hypertension     Medications:  See medication history  Assessment: 33 yo man to start heparin for CP.  He was not on anticoagulation PTA.  Baseline Hg 14.9, PTLC 210 Goal of Therapy:  Heparin level 0.3-0.7 units/ml Monitor platelets by anticoagulation protocol: Yes   Plan:  Heparin bolus 4000 units and drip at 1300 units/hr Check heparin level 6-8 hours after start and daily while on heparin Daily CBC Monitor for bleeding complications  Thanks for allowing pharmacy to be a part of this patient's care.  Excell Seltzer, PharmD Clinical Pharmacist 06/22/2019,4:04 AM

## 2019-06-22 NOTE — Progress Notes (Signed)
CRITICAL VALUE ALERT  Critical Value: potassium 2.5, trop 3,194  Date & Time Notied:   3361 06/22/19  Provider Notified: Mannam  Orders Received/Actions taken: Order received for one time dose of 40 of potassium. Will continue to monitor.

## 2019-06-22 NOTE — Progress Notes (Signed)
CRITICAL VALUE ALERT  Critical Value:  William [name]  Date & Time Notied:  06/22/19 1430  Provider Notified: Mannam  Orders Received/Actions taken: Orders received to continue trending troponin.    MD also made aware of abnormal MRI results.

## 2019-06-22 NOTE — ED Notes (Signed)
Attempted to call report; charge nurse to assign bed and nurse will call me back.

## 2019-06-22 NOTE — Progress Notes (Addendum)
RN informed by MRI staff that pt would not remain still for imaging and kept removing equipment. Spoke with MD; orders received for versed.     A total of 2 mg of versed was given. VSS.

## 2019-06-22 NOTE — ED Notes (Signed)
Pt unable to void at this time. Aware UA needed.

## 2019-06-22 NOTE — Progress Notes (Signed)
Assisted tele visit to patient with wife.  Yang Rack Anderson, RN   

## 2019-06-22 NOTE — Progress Notes (Signed)
CRITICAL VALUE ALERT  Critical Value:  Trop 4,190  Date & Time Notied:  06/22/19 1200  Provider Notified: Mannam.  Orders Received/Actions taken: No new orders at this time.

## 2019-06-22 NOTE — ED Notes (Signed)
Pt c/o headache (9/10) and asks for some pain meds.

## 2019-06-22 NOTE — ED Notes (Signed)
Bladder scanned. Volume: 245ml. Pt given urinal but still unable to void at this time.

## 2019-06-22 NOTE — Progress Notes (Signed)
Pt w/ no u/o since arrival to unit. LR started this morning. Bladder scan reveals 200 cc urine. MD aware; plan to monitor for the next 24 hours.

## 2019-06-22 NOTE — ED Notes (Signed)
Pt mildly diaphoretic, and restless. C/o feeling anxious. Denies CP or SOB.

## 2019-06-22 NOTE — Consult Note (Addendum)
Cardiology Consultation:   Patient ID: William [name] MRN: [id number]; DOB: [birth year]  Admit date: 06/21/2019 Date of Consult: 06/22/2019  Primary Care Provider: Patient, No Pcp Per Primary Cardiologist: No primary care provider on file. new Primary Electrophysiologist:  None    Patient Profile:   William [name] is a 33 y.o. male with a hx of HTN who is being seen today for the evaluation of elevated troponin at the request of Dr Vaughan Browner.  History of Present Illness:   William [name] with hx of HTN presented to ER 06/21/19 with SOB constipation lack of appetitive.  Initial BP 134/86 and ST at 112,  but increased 254/167   He had run out of his meds over 6 months ago.   His Cr 4.95 and Na 133, K+ 2.8  WBC 12.8 COVID neg  He had headache on admit  Troponin HS 606; 3194; 4190; 5222; 6239  Today Na 134 K+3.2 Cr 5.43  WBC 16.4   Now with encephalopathy, hypokalemia     EKG:  The EKG was personally reviewed and demonstrates:  SR LVH with T wave inversions inf lateral.   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 Hypertension     History reviewed. No pertinent surgical history.   Home Medications:  Prior to Admission medications   Medication Sig Start Date End Date Taking? Authorizing Provider  acetaminophen (TYLENOL) 500 MG tablet Take 2 tablets (1,000 mg total) by mouth every 8 (eight) hours as needed. Patient not taking: Reported on 06/21/2019 12/17/17   Caccavale, Sophia, PA-C  cyclobenzaprine (FLEXERIL) 5 MG tablet Take 1 tablet (5 mg total) by mouth 2 (two) times daily as needed for muscle spasms. Patient not  taking: Reported on 06/21/2019 12/17/17   Caccavale, Sophia, PA-C  ibuprofen (ADVIL,MOTRIN) 600 MG tablet Take 1 tablet (600 mg total) by mouth every 6 (six) hours as needed. Patient not taking: Reported on 06/21/2019 09/12/14   Julianne Rice, MD  lisinopril (PRINIVIL,ZESTRIL) 10 MG tablet Take 1 tablet (10 mg total) by mouth daily. Patient not taking: Reported on 06/21/2019 12/17/17   Caccavale, Sophia, PA-C  traMADol (ULTRAM) 50 MG tablet Take 1 tablet (50 mg total) by mouth every 6 (six) hours as needed. Patient not taking: Reported on 06/21/2019 09/12/14   Julianne Rice, MD    Inpatient Medications: Scheduled Meds: . [START ON 06/23/2019] amLODipine  10 mg Oral Daily  . [START ON 06/23/2019] aspirin  81 mg Oral Daily  . potassium chloride  40 mEq Oral BID   Continuous Infusions: . lactated ringers 100 mL/hr at 06/22/19 1700  . niCARDipine 15 mg/hr (06/22/19 1700)   PRN Meds: hydrALAZINE  Allergies:    Allergies  Allergen Reactions  . Reglan [Metoclopramide]     Develops akathisia    Social History:   Social History   Socioeconomic History  . Marital status: Married    Spouse name: Not on file  . Number of children: Not on file  . Years of education: Not on file  . Highest education level: Not on file  Occupational History  . Not on file  Social Needs  .  Financial resource strain: Not on file  . Food insecurity    Worry: Not on file    Inability: Not on file  . Transportation needs    Medical: Not on file    Non-medical: Not on file  Tobacco Use  . Smoking status: Current Every Day Smoker    Packs/day: 1.00  . Smokeless tobacco: Never Used  Substance and Sexual Activity  . Alcohol use: Yes    Comment: 2-3 beers a day  . Drug use: Yes    Types: Marijuana  . Sexual activity: Not on file  Lifestyle  . Physical activity    Days per week: Not on file    Minutes per session: Not on file  . Stress: Not on file  Relationships  . Social Herbalist on phone:  Not on file    Gets together: Not on file    Attends religious service: Not on file    Active member of club or organization: Not on file    Attends meetings of clubs or organizations: Not on file    Relationship status: Not on file  . Intimate partner violence    Fear of current or ex partner: Not on file    Emotionally abused: Not on file    Physically abused: Not on file    Forced sexual activity: Not on file  Other Topics Concern  . Not on file  Social History Narrative  . Not on file    Family History:   Unable to converse with pt, so unable to know FH  ROS:  Please see the history of present illness.  General:no colds or fevers, no weight changes Skin:no rashes or ulcers HEENT:no blurred vision, no congestion CV:see HPI PUL:see HPI GI:no diarrhea constipation or melena, no indigestion GU:no hematuria, no dysuria MS:no joint pain, no claudication Neuro:no syncope, no lightheadedness Endo:no diabetes, no thyroid disease  All other ROS reviewed and negative.     Physical Exam/Data:   Vitals:   06/22/19 1645 06/22/19 1723 06/22/19 1730 06/22/19 1741  BP: (!) 163/99 (!) 171/105 (!) 141/110   Pulse: (!) 140  (!) 127   Resp: (!) 22  (!) 22   Temp:    98.5 F (36.9 C)  TempSrc:    Oral  SpO2: 98%  100%   Weight:      Height:        Intake/Output Summary (Last 24 hours) at 06/22/2019 1808 Last data filed at 06/22/2019 1700 Gross per 24 hour  Intake 1376.32 ml  Output -  Net 1376.32 ml   Last 3 Weights 06/22/2019 06/21/2019  Weight (lbs) 180 lb 8.9 oz 220 lb  Weight (kg) 81.9 kg 99.791 kg     Body mass index is 26.66 kg/m.  Per Dr. Percival Spanish  Relevant CV Studies: Echo 06/22/19  IMPRESSIONS    1. The left ventricle has hyperdynamic systolic function, with an ejection fraction of >65%. The cavity size was decreased. There is severe concentric left ventricular hypertrophy. Left ventricular diastolic parameters were normal. No evidence of left  ventricular  regional wall motion abnormalities.  2. The right ventricle has normal systolic function. The cavity was normal. There is no increase in right ventricular wall thickness. Right ventricular systolic pressure could not be assessed.  3. Small pericardial effusion.  4. No evidence of mitral valve stenosis.  5. The aortic valve is tricuspid. No stenosis of the aortic valve.  SUMMARY   Hyperdynamic LV and RV function, with  near collapse of the intraventricular chambers during systole; consider hypovolemia. Severe concentric LVH, with LV gradient of at least 40 mmHg in the mid cavity and 66mmHg near LVOT. No SAM of the mitral valve seen on this study.  FINDINGS  Left Ventricle: The left ventricle has hyperdynamic systolic function, with an ejection fraction of >65%. The cavity size was decreased. There is severe concentric left ventricular hypertrophy. Left ventricular diastolic parameters were normal. No  evidence of left ventricular regional wall motion abnormalities..  Right Ventricle: The right ventricle has normal systolic function. The cavity was normal. There is no increase in right ventricular wall thickness. Right ventricular systolic pressure could not be assessed.  Left Atrium: Left atrial size was normal in size.  Right Atrium: Right atrial size was normal in size.  Interatrial Septum: No atrial level shunt detected by color flow Doppler.  Pericardium: A small pericardial effusion is present. There is no evidence of cardiac tamponade.  Mitral Valve: The mitral valve is normal in structure. Mitral valve regurgitation is trivial by color flow Doppler. No evidence of mitral valve stenosis.  Tricuspid Valve: The tricuspid valve is normal in structure. Tricuspid valve regurgitation was not visualized by color flow Doppler.  Aortic Valve: The aortic valve is tricuspid Aortic valve regurgitation was not visualized by color flow Doppler. There is No stenosis of the aortic valve.   Pulmonic Valve: The pulmonic valve was grossly normal. Pulmonic valve regurgitation is not visualized by color flow Doppler. No evidence of pulmonic stenosis.  Aorta: The ascending aorta was not well visualized. The aortic arch was not well visualized. The descending aorta was not well visualized.  Pulmonary Artery: The pulmonary artery is not well seen.  Venous: The inferior vena cava is normal in size with greater than 50% respiratory variability.      Laboratory Data:  High Sensitivity Troponin:   Recent Labs  Lab 06/22/19 0429 06/22/19 0744 06/22/19 1054 06/22/19 1324 06/22/19 1626  TROPONINIHS 606* 3,194* 4,190* 5,222* 6,239*     Cardiac EnzymesNo results for input(s): TROPONINI in the last 168 hours. No results for input(s): TROPIPOC in the last 168 hours.  Chemistry Recent Labs  Lab 06/21/19 2354 06/22/19 0744 06/22/19 1324  NA 133* 132* 134*  K 2.8* 2.5* 3.2*  CL 93* 93* 97*  CO2 27 23 24   GLUCOSE 140* 141* 112*  BUN 25* 27* 29*  CREATININE 4.95* 5.29* 5.43*  CALCIUM 9.1 9.1 9.1  GFRNONAA 14* 13* 13*  GFRAA 16* 15* 15*  ANIONGAP 13 16* 13    Recent Labs  Lab 06/21/19 2354  PROT 7.2  ALBUMIN 3.4*  AST 21  ALT 15  ALKPHOS 81  BILITOT 1.1   Hematology Recent Labs  Lab 06/21/19 2354 06/22/19 0744  WBC 12.8* 16.4*  RBC 4.69 4.75  HGB 14.9 14.8  HCT 41.6 41.6  MCV 88.7 87.6  MCH 31.8 31.2  MCHC 35.8 35.6  RDW 12.0 11.9  PLT 210 236   BNPNo results for input(s): BNP, PROBNP in the last 168 hours.  DDimer No results for input(s): DDIMER in the last 168 hours.   Radiology/Studies:  Dg Chest 2 View  Result Date: 06/21/2019 CLINICAL DATA:  Dyspnea for 3 days, smoker EXAM: CHEST - 2 VIEW COMPARISON:  None. FINDINGS: Normal heart size. Normal mediastinal contour. No pneumothorax. No pleural effusion. Lungs appear clear, with no acute consolidative airspace disease and no pulmonary edema. IMPRESSION: No active cardiopulmonary disease.  Electronically Signed   By: Ilona Sorrel  M.D.   On: 06/21/2019 17:21   Ct Head Wo Contrast  Result Date: 06/22/2019 CLINICAL DATA:  Headache. Hypertensive emergency. Significantly elevated blood pressure. EXAM: CT HEAD WITHOUT CONTRAST TECHNIQUE: Contiguous axial images were obtained from the base of the skull through the vertex without intravenous contrast. COMPARISON:  None. FINDINGS: Brain: No evidence of acute infarction, hemorrhage, hydrocephalus, extra-axial collection or mass lesion/mass effect. Patchy areas of low-density in the bilateral caudate, periventricular white matter, and right thalamus are nonspecific. Vascular: No hyperdense vessel or unexpected calcification. Skull: No fracture or focal lesion. Sinuses/Orbits: Small mucous retention cyst in both maxillary sinuses. The paranasal sinuses and mastoid air cells are otherwise clear. The visualized orbits are unremarkable. Other: None. IMPRESSION: 1. Patchy areas of low-density in the bilateral caudate, periventricular white matter, and right thalamus are nonspecific and may represent chronic microvascular ischemic changes,, however would be unusual for patient age. Demyelinating disease such as multiple sclerosis also considered. Consider MRI for further evaluation. 2. No acute hemorrhage. Electronically Signed   By: Keith Rake M.D.   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 Headache. Abnormal CT appearance of the brain earlier today. EXAM: MRI HEAD WITHOUT CONTRAST TECHNIQUE: Multiplanar, multiecho pulse sequences of the brain and surrounding structures were obtained without intravenous contrast. COMPARISON:  Head CT 0209 hours today. FINDINGS: Brain: There are fairly numerous mostly small areas of restricted diffusion scattered in the brainstem and bilateral hemispheres. Among the largest areas is that in the medial left periatrial white matter near  the splenium of the corpus callosum on series 2, image 26. There is involvement of the bilateral deep white matter capsules. There are a few scattered areas of small cortical involvement. There is scattered subcortical white matter involvement. And there is globular restricted diffusion in multiple areas of the pons and the pontomedullary junction (series 2, images 12-15). Superimposed on the abnormal pontine diffusion is diffuse pontomedullary T2 and FLAIR hyperintensity with expansion and generally facilitated diffusion. And there is a similar abnormal appearance of the medial thalami (series 5, image 14). Extensive superimposed bilateral T2 and FLAIR hyperintensity in the basal ganglia appears to represent a combination of acute and chronic abnormality, with small areas of cystic encephalomalacia. There is no posterior circulation predominant cortical gray and white matter involvement typical of posterior reversible encephalopathy syndrome (PRES). Questionable subtle acute involvement of the cerebellum. There is also a tiny chronic right cerebellar infarct on series 5, image 8. There are superimposed chronic microhemorrhages in the bilateral deep gray matter nuclei and pons. Also, there is volume loss and confluent abnormal T2 and FLAIR hyperintensity in the body of the corpus callosum (series 11, image 111. Additionally, bilateral periventricular white matter T2 and FLAIR hyperintensity is somewhat nodular and oriented perpendicular to the lateral ventricles (such as on series 11, image 136). No contrast administered. No midline shift, mass effect, evidence of mass lesion, ventriculomegaly, extra-axial collection or acute intracranial hemorrhage. Cervicomedullary junction and pituitary are within normal limits. Vascular: Major intracranial vascular flow voids are preserved, the left vertebral artery appears dominant. Skull and upper cervical spine: Negative visible cervical spine. Visualized bone marrow signal  is within normal limits. Sinuses/Orbits: Negative orbits. Mild paranasal sinus mucosal thickening. Other: Mastoids remain clear. Scalp and face soft tissues appear negative. IMPRESSION: 1. Widespread abnormal findings in the brain. 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 - severe T2 hyperintensity and expansion  of the pons, medulla, and the medial thalami. 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 - superimposed corpus callosum and cerebral white matter disease which in some ways resembles demyelination. - no associated acute hemorrhage. No significant intracranial mass effect at this time. 2. Although some of the typical features are absent, Severe Posterior Reversible Encephalopathy Syndrome (PRES) should be considered, and could also explain the scattered acute infarcts. Other differential considerations would include more than one acute disease process, such as Osmotic Demyelination combined with Acute Emboli. Acute infectious encephalitis also is less likely. 3. Recommend Neurology consultation. Electronically Signed   By: Genevie Ann M.D.   On: 06/22/2019 14:10    Assessment and Plan:   1. Elevated troponin see Dr. Percival Spanish note      For questions or updates, please contact [facility] Please consult [email or web address] for contact info under   MEDICAL DECISION MAKING: Patient here with likely hypertensive urgency/emergency.  Chest x-ray shows normal cardiac silhouette and no widened mediastinum.  EKG does show ischemic abnormalities in the inferior leads with no old for comparison.  Will give IV hydralazine.  Patient is high risk for dissection, aneurysm, stroke, ACS.  Doubt PE.  Will obtain cardiac labs.  I feel he will need a CT scan to rule out dissection.  He denies any headache or neurologic deficits at this time.  I feel patient will need admission.   ED PROGRESS: IV hydralazine as needed has not significantly improved patient's blood pressure.  I feel we will need to lower his blood pressure by at least 38% to a systolic of 182.  Will start on IV Cardene drip.  He does endorse recent cocaine use.  At this time will avoid beta-blockers.  He is complaining of headache currently.  Will give Reglan, Benadryl for symptomatic relief and obtain CT of his head.  He denies any other stimulant use including methamphetamine.  Patient's creatinine is elevated at 4.95.  Troponin also slightly elevated at 44.  Unable to obtain a dissection study at this time given his significantly elevated creatinine.  I worry that if he receives contrast, he will end up on dialysis.  We have had lengthy discussion regarding this and he agrees he would like to hold off on CT imaging.  He states his chest pain is only mild tightness and no pain that goes into his back or abdomen.  He denies any tearing sensation.  He has normal neurologic exam and equal pulses in all extremities and equal blood pressures on both arms.  Will work on aggressive blood pressure control and obtain CT of the head.  Will discuss with ICU for admission.  2:23 AM  Discussed with Dr. Jimmy Footman with [facility].  Critical care team will see patient in the ED.  He is tachycardic here but given recent cocaine use, avoiding beta blockers.  Blood pressure improving with IV Cardene drip.   Patient has become very restless after given IV Reglan for headache.  Suspect akathisia.  Will give IV Ativan.  Head CT shows no acute hemorrhage.  He does have patchy areas of low density in the bilateral caudate, periventricular white matter and right thalamus that are nonspecific.  He has no focal neurologic deficits at this time.  Radiology recommended MRI to be considered for further evaluation.  I do not feel he needs this emergently and can be ordered as needed by the admitting team.  Signed, Cecilie Kicks, NP   06/22/2019 6:08 PM   History and all data above reviewed.   Patient is an unfortunate gentleman with hypertension.  He presented to the emergency room with vague complaints including constipation.  He was apparently mentating at that time but was extremely hypertensive.  There is some mention of him doing cocaine although I do not see a urine drug screen.  He was treated for hypertensive urgency and was treated with hydralazine.  He had reglan and ativan. He became agitated by ED records.  He had a head CT with results as above and today an MRI.  He has had continued agitation and altered mental status.  He has been persistently hypertensive and is on Nicardipine.  He has had elevated hs Trop that have climbed throughout the day.  He has had no acute EKG changes.  He is somnolent and not cooperative.  He does wake up enough to report that he is not having chest pain but otherwise he does not answer questions.  He has had NSVT.   He did have versed prior to the MRI earlier today.   Echo hyperdynamic LV without wall motion abnormalities.  Severe LVH  GENERAL:  Agitated  HEENT:  Unable to assess NECK:  No jugular venous distention, waveform within normal limits, carotid upstroke brisk and symmetric, no bruits, no thyromegaly LUNGS:  Clear to auscultation bilaterally BACK:  No CVA tenderness CHEST:  Apical systolic  HEART:  PMI not displaced or sustained,S1 and S2 within normal limits, no S3, no S4, no clicks, no rubs, 3/6 apical systolic murmur radiating out the aortic outflow tract.  No diastolic murmurs ABD:  Flat, positive bowel sounds normal in frequency in pitch, no bruits, no rebound, no guarding, no midline pulsatile mass, no hepatomegaly, no splenomegaly EXT:  2 plus pulses throughout, no edema, no cyanosis no clubbing SKIN:  No rashes no nodules NEURO: Moves all extremities PSYCH:  Not answering questions  All available labs, radiology testing, previous records reviewed. Agree with documented  assessment and plan.   ELEVATED TROPONIN:  This is likely related to demand ischemia with his hypertensive urgency, tachycardia on top of severe LVH.  Cannot exclude N STEMI with an acute coronary syndrome but is not having chest pain and there are no dynamic ST segment changes.  Would treat with aspirin most was a contraindication from neurology.  I have discussed with neurology and would not suggest heparin if there is any risk of hemorrhagic conversion with his markedly abnormal MRI.  At this point will hold off on heparin.  Avoiding beta blocker secondary to recent cocaine use.  Continue with nicardipine drip.  Hydralazine as needed with permissive hypertension allowable.  Ideally he would eventually have cardiac catheterization prior to discharge but is not necessary urgently.  LVH: Suspect he is had longstanding uncontrolled hypertension though an infiltrative process could not be excluded and further work-up would be needed.     Jeneen Rinks Damaria Vachon  6:44 PM  06/22/2019

## 2019-06-22 NOTE — ED Notes (Signed)
Multiple attempts have been made to collect urine with no success. Md aware

## 2019-06-22 NOTE — ED Notes (Signed)
ED TO INPATIENT HANDOFF REPORT  ED Nurse Name and Phone #:  Otila Kluver @ [phone]  S Name/Age/Gender William [name] 33 y.o. male Room/Bed: 029C/029C  Code Status   Code Status: Full Code  Home/SNF/Other Home Patient oriented to: self, place, time and situation Is this baseline? Yes   Triage Complete: Triage complete  Chief Complaint SHOB, Light headed  Triage Note Pt reports multiple complaints, SOB,constipation, unable to eat for the past 3 days. Denies fever/cough. Pt a.o, nad noted.   Allergies Allergies  Allergen Reactions  . Reglan [Metoclopramide]     Develops akathisia    Level of Care/Admitting Diagnosis ED Disposition    ED Disposition Condition [facility]: [place] [[id number]]  Level of Care: ICU [6]  Covid Evaluation: Confirmed COVID Negative  Diagnosis: Hypertensive crisis [852778]  Admitting Physician: Aldean Jewett [[id number]]  Attending Physician: Aldean Jewett [[id number]]  Estimated length of stay: 3 - 4 days  Certification:: I certify this patient will need inpatient services for at least 2 midnights  PT Class (Do Not Modify): Inpatient [101]  PT Acc Code (Do Not Modify): Private [1]       B Medical/Surgery History Past Medical History:  Diagnosis Date  . Hypertension    History reviewed. No pertinent surgical history.   A IV Location/Drains/Wounds Patient Lines/Drains/Airways Status   Active Line/Drains/Airways    Name:   Placement date:   Placement time:   Site:   Days:   Peripheral IV 06/22/19 Right Antecubital   06/22/19    0041    Antecubital   less than 1   Peripheral IV 06/22/19 Right Hand   06/22/19    0221    Hand   less than 1          Intake/Output Last 24 hours No intake or output data in the 24 hours ending 06/22/19 0414  Labs/Imaging Results for orders placed or performed during the hospital encounter of 06/21/19 (from the past 48 hour(s))  CBC     Status: Abnormal   Collection  Time: 06/21/19 11:54 PM  Result Value Ref Range   WBC 12.8 (H) 4.0 - 10.5 K/uL   RBC 4.69 4.22 - 5.81 MIL/uL   Hemoglobin 14.9 13.0 - 17.0 g/dL   HCT 41.6 39.0 - 52.0 %   MCV 88.7 80.0 - 100.0 fL   MCH 31.8 26.0 - 34.0 pg   MCHC 35.8 30.0 - 36.0 g/dL   RDW 12.0 11.5 - 15.5 %   Platelets 210 150 - 400 K/uL   nRBC 0.0 0.0 - 0.2 %    Comment: Performed at [facility], [place] [address] [place], [place] [address]  Comprehensive metabolic panel     Status: Abnormal   Collection Time: 06/21/19 11:54 PM  Result Value Ref Range   Sodium 133 (L) 135 - 145 mmol/L   Potassium 2.8 (L) 3.5 - 5.1 mmol/L   Chloride 93 (L) 98 - 111 mmol/L   CO2 27 22 - 32 mmol/L   Glucose, Bld 140 (H) 70 - 99 mg/dL   BUN 25 (H) 6 - 20 mg/dL   Creatinine, Ser 4.95 (H) 0.61 - 1.24 mg/dL   Calcium 9.1 8.9 - 10.3 mg/dL   Total Protein 7.2 6.5 - 8.1 g/dL   Albumin 3.4 (L) 3.5 - 5.0 g/dL   AST 21 15 - 41 U/L   ALT 15 0 - 44 U/L   Alkaline Phosphatase 81 38 -  126 U/L   Total Bilirubin 1.1 0.3 - 1.2 mg/dL   GFR calc non Af Amer 14 (L) >60 mL/min   GFR calc Af Amer 16 (L) >60 mL/min   Anion gap 13 5 - 15    Comment: Performed at [facility] [address], [place], [place] [address]  Troponin I (High Sensitivity)     Status: Abnormal   Collection Time: 06/21/19 11:54 PM  Result Value Ref Range   Troponin I (High Sensitivity) 44 (H) <18 ng/L    Comment: (NOTE) Elevated high sensitivity troponin I (hsTnI) values and significant  changes across serial measurements may suggest ACS but many other  chronic and acute conditions are known to elevate hsTnI results.  Refer to the Links section for chest pain algorithms and additional  guidance. Performed at [facility], [place] [address] [place], [place] [address]   SARS Coronavirus 2 (CEPHEID - Performed in [facility] hospital lab), Hosp Order     Status: None   Collection Time: 06/22/19 12:20 AM   Specimen: Nasopharyngeal Swab  Result Value Ref  Range   SARS Coronavirus 2 NEGATIVE NEGATIVE    Comment: (NOTE) If result is NEGATIVE SARS-CoV-2 target nucleic acids are NOT DETECTED. The SARS-CoV-2 RNA is generally detectable in upper and lower  respiratory specimens during the acute phase of infection. The lowest  concentration of SARS-CoV-2 viral copies this assay can detect is 250  copies / mL. A negative result does not preclude SARS-CoV-2 infection  and should not be used as the sole basis for treatment or other  patient management decisions.  A negative result may occur with  improper specimen collection / handling, submission of specimen other  than nasopharyngeal swab, presence of viral mutation(s) within the  areas targeted by this assay, and inadequate number of viral copies  (<250 copies / mL). A negative result must be combined with clinical  observations, patient history, and epidemiological information. If result is POSITIVE SARS-CoV-2 target nucleic acids are DETECTED. The SARS-CoV-2 RNA is generally detectable in upper and lower  respiratory specimens dur ing the acute phase of infection.  Positive  results are indicative of active infection with SARS-CoV-2.  Clinical  correlation with patient history and other diagnostic information is  necessary to determine patient infection status.  Positive results do  not rule out bacterial infection or co-infection with other viruses. If result is PRESUMPTIVE POSTIVE SARS-CoV-2 nucleic acids MAY BE PRESENT.   A presumptive positive result was obtained on the submitted specimen  and confirmed on repeat testing.  While 2019 novel coronavirus  (SARS-CoV-2) nucleic acids may be present in the submitted sample  additional confirmatory testing may be necessary for epidemiological  and / or clinical management purposes  to differentiate between  SARS-CoV-2 and other Sarbecovirus currently known to infect humans.  If clinically indicated additional testing with an alternate test   methodology [phone]) is advised. The SARS-CoV-2 RNA is generally  detectable in upper and lower respiratory sp ecimens during the acute  phase of infection. The expected result is Negative. Fact Sheet for Patients:  StrictlyIdeas.no Fact Sheet for Healthcare Providers: BankingDealers.co.za This test is not yet approved or cleared by the [place] FDA and has been authorized for detection and/or diagnosis of SARS-CoV-2 by FDA under an Emergency Use Authorization (EUA).  This EUA will remain in effect (meaning this test can be used) for the duration of the COVID-19 declaration under Section 564(b)(1) of the Act, 21 U.S.C.  section 360bbb-3(b)(1), unless the authorization is terminated or revoked sooner. Performed at [facility], [place] [address], [place], [place] [address]   Troponin I (High Sensitivity)     Status: Abnormal   Collection Time: 06/22/19  2:23 AM  Result Value Ref Range   Troponin I (High Sensitivity) 78 (H) <18 ng/L    Comment: DELTA CHECK NOTED (NOTE) Elevated high sensitivity troponin I (hsTnI) values and significant  changes across serial measurements may suggest ACS but many other  chronic and acute conditions are known to elevate hsTnI results.  Refer to the Links section for chest pain algorithms and additional  guidance. Performed at [facility], [place] [address] [place], [place] [address]    Dg Chest 2 View  Result Date: 06/21/2019 CLINICAL DATA:  Dyspnea for 3 days, smoker EXAM: CHEST - 2 VIEW COMPARISON:  None. FINDINGS: Normal heart size. Normal mediastinal contour. No pneumothorax. No pleural effusion. Lungs appear clear, with no acute consolidative airspace disease and no pulmonary edema. IMPRESSION: No active cardiopulmonary disease. Electronically Signed   By: Ilona Sorrel M.D.   On: 06/21/2019 17:21   Ct Head Wo Contrast  Result Date: 06/22/2019 CLINICAL DATA:  Headache. Hypertensive  emergency. Significantly elevated blood pressure. EXAM: CT HEAD WITHOUT CONTRAST TECHNIQUE: Contiguous axial images were obtained from the base of the skull through the vertex without intravenous contrast. COMPARISON:  None. FINDINGS: Brain: No evidence of acute infarction, hemorrhage, hydrocephalus, extra-axial collection or mass lesion/mass effect. Patchy areas of low-density in the bilateral caudate, periventricular white matter, and right thalamus are nonspecific. Vascular: No hyperdense vessel or unexpected calcification. Skull: No fracture or focal lesion. Sinuses/Orbits: Small mucous retention cyst in both maxillary sinuses. The paranasal sinuses and mastoid air cells are otherwise clear. The visualized orbits are unremarkable. Other: None. IMPRESSION: 1. Patchy areas of low-density in the bilateral caudate, periventricular white matter, and right thalamus are nonspecific and may represent chronic microvascular ischemic changes,, however would be unusual for patient age. Demyelinating disease such as multiple sclerosis also considered. Consider MRI for further evaluation. 2. No acute hemorrhage. Electronically Signed   By: Keith Rake M.D.   On: 06/22/2019 02:24    Pending Labs Unresulted Labs (From admission, onward)    Start     Ordered   06/23/19 0500  Heparin level (unfractionated)  Daily,   R     06/22/19 0409   06/23/19 0500  CBC  Daily,   R     06/22/19 0409   06/22/19 1130  Heparin level (unfractionated)  Once-Timed,   STAT     06/22/19 0409   06/22/19 0981  Basic metabolic panel  Tomorrow morning,   R     06/22/19 0359   06/22/19 0800  CBC  Tomorrow morning,   STAT     06/22/19 0359   06/22/19 0500  Troponin I (High Sensitivity)  STAT Now then every 2 hours,   R (with STAT occurrences)    Question:  Indication  Answer:  Suspect ACS   06/22/19 0359   06/22/19 0344  HIV antibody (Routine Testing)  Once,   STAT     06/22/19 0357   06/22/19 0120  Rapid urine drug screen  (hospital performed)  ONCE - STAT,   STAT     06/22/19 0119   06/21/19 2336  Urinalysis, Routine w reflex microscopic  ONCE - STAT,   STAT     06/21/19 2336  Vitals/Pain Today's Vitals   06/22/19 0317 06/22/19 0320 06/22/19 0347 06/22/19 0409  BP:  (!) 149/90 (!) 148/101 (!) 159/113  Pulse:  (!) 118 (!) 108 (!) 116  Resp:  (!) 24 (!) 21 20  Temp:      TempSrc:      SpO2:  100% 100% 99%  Weight:      Height:      PainSc: 0-No pain   Asleep    Isolation Precautions No active isolations  Medications Medications  nicardipine (CARDENE) 20mg  in 0.86% saline 239ml IV infusion (0.1 mg/ml) (0 mg/hr Intravenous Stopped 06/22/19 0409)  potassium chloride SA (K-DUR) CR tablet 40 mEq (has no administration in time range)  hydrALAZINE (APRESOLINE) injection 10 mg (10 mg Intravenous Given 06/21/19 2351)  hydrALAZINE (APRESOLINE) injection 10 mg (10 mg Intravenous Given 06/22/19 0040)  metoCLOPramide (REGLAN) injection 10 mg (10 mg Intravenous Given 06/22/19 0137)  diphenhydrAMINE (BENADRYL) injection 25 mg (25 mg Intravenous Given 06/22/19 0136)  LORazepam (ATIVAN) injection 1 mg (1 mg Intravenous Given 06/22/19 0229)    Mobility walks Low fall risk   Focused Assessments Cardiac Assessment Handoff:    No results found for: CKTOTAL, CKMB, CKMBINDEX, TROPONINI No results found for: DDIMER Does the Patient currently have chest pain? No      R Recommendations: See Admitting Provider Note  Report given to:   Additional Notes:  Pt has not had BP meds x6 months.

## 2019-06-22 NOTE — H&P (Signed)
NAME:  William [name], MRN:  [id number], DOB:  [birth year], LOS: 0 ADMISSION DATE:  06/21/2019, CONSULTATION DATE:  7/9 REFERRING MD:  Dr Leonides Schanz EDP, CHIEF COMPLAINT:  Hypertensive crisis   Brief History   33 year old male admitted 7/9 with hypertensive crisis on nicardipine infusion  History of present illness   33 year old male with past medical history as below, which is significant for hypertension.  He is a poor, if not unwilling historian.  He is prescribed lisinopril, however, he has not taken it for about the last 6 months after he ran out.  He does admit to recently using cocaine.  He presented to [facility] emergency department in the late p.m. hours of 7/8 with complaints of shortness of breath that started just earlier in the day.  He denies cough, chest pain, headache, fever/chills.  He denies lightheadedness, dizziness, and blurry vision.  Blood pressure in the emergency department as high as 254/167.  He was given as needed hydralazine which was mostly ineffective and was started on nicardipine infusion.  His blood pressure did respond to this.  PCCM was asked to admit to ICU.  Past Medical History   has a past medical history of Hypertension.   Significant Hospital Events   7/9 admit  Consults:    Procedures:    Significant Diagnostic Tests:  [facility] 7/9 > Patchy areas of low-density in the bilateral caudate, periventricular white matter, and right thalamus are nonspecific and may represent chronic microvascular ischemic changes, however would be unusual for patient age. Demyelinating disease such as multiple sclerosis also considered.  Micro Data:  COVID neg 7/9  Antimicrobials:     Interim history/subjective:    Objective   Blood pressure (!) 145/96, pulse (!) 118, temperature 98.5 F (36.9 C), temperature source Oral, resp. rate (!) 27, height 5\' 9"  (1.753 m), weight 99.8 kg, SpO2 100 %.       No intake or output data in the 24 hours ending 06/22/19 0321 Filed  Weights   06/21/19 1648  Weight: 99.8 kg    Examination: General: young adult male in NAD HENT: [place]/AT, PERRL, no JVD Lungs: Clear bilateral breath sounds Cardiovascular: RRR 4/6 murmur Abdomen: Soft, non-tender, non-distended Extremities: No acute deformity. No edema Neuro: Alert, oriented, non-focal. Drowsy.   Resolved Hospital Problem list     Assessment & Plan:   Hypertensive urgency: has been off his lisinopril for 6 months. Also recent cocaine use - Nicardipine infusion to target SBP ~ 167mmHg - Hold home lisinopril given AKI - Start PO antihypertensives in AM  AKI: - BP control - IV hydration - Repeat BMP  Elevated troponin: Mild elevation, likely demand ischemia. Also some ST non-specific findings on EKG.  Systolic Murmur - Repeat EKG - Continue to trend troponin - Echocardiogram - Discussed with cardiology. No need for intervention at this time.   Hypokalemia - PO potassium supplementation.   - Repeat BMP  Abnormal CT head: changes concerning for chronic ischemic or demyelinating process  - consider neurology consultation  Best practice:  Diet: NPO Pain/Anxiety/Delirium protocol (if indicated): NA VAP protocol (if indicated): NA DVT prophylaxis: NA GI prophylaxis: NA Glucose control: NA Mobility: BR Code Status: FULL Family Communication: Patient updated Disposition: ICU  Labs   CBC: Recent Labs  Lab 06/21/19 2354  WBC 12.8*  HGB 14.9  HCT 41.6  MCV 88.7  PLT 416    Basic Metabolic Panel: Recent Labs  Lab 06/21/19 2354  NA 133*  K 2.8*  CL 93*  CO2 27  GLUCOSE 140*  BUN 25*  CREATININE 4.95*  CALCIUM 9.1   GFR: Estimated Creatinine Clearance: 24.5 mL/min (A) (by C-G formula based on SCr of 4.95 mg/dL (H)). Recent Labs  Lab 06/21/19 2354  WBC 12.8*    Liver Function Tests: Recent Labs  Lab 06/21/19 2354  AST 21  ALT 15  ALKPHOS 81  BILITOT 1.1  PROT 7.2  ALBUMIN 3.4*   No results for input(s): LIPASE, AMYLASE in  the last 168 hours. No results for input(s): AMMONIA in the last 168 hours.  ABG No results found for: PHART, PCO2ART, PO2ART, HCO3, TCO2, ACIDBASEDEF, O2SAT   Coagulation Profile: No results for input(s): INR, PROTIME in the last 168 hours.  Cardiac Enzymes: No results for input(s): CKTOTAL, CKMB, CKMBINDEX, TROPONINI in the last 168 hours.  HbA1C: No results found for: HGBA1C  CBG: No results for input(s): GLUCAP in the last 168 hours.  Review of Systems:   Bolds are positive  Constitutional: weight loss, gain, night sweats, Fevers, chills, fatigue .  HEENT: headaches, Sore throat, sneezing, nasal congestion, post nasal drip, Difficulty swallowing, Tooth/dental problems, visual complaints visual changes, ear ache CV:  chest pain, radiates:,Orthopnea, PND, swelling in lower extremitie*, dizziness, palpitations, syncope.  GI  heartburn, indigestion, abdominal pain, nausea, vomiting, diarrhea, change in bowel habits, loss of appetite, bloody stools.  Resp: cough, productive: , hemoptysis, dyspnea, chest pain, pleuritic.  Skin: rash or itching or icterus GU: dysuria, change in color of urine, urgency or frequency. flank pain, hematuria  MS: joint pain or swelling. decreased range of motion  Psych: change in mood or affect. depression or anxiety.  Neuro: difficulty with speech, weakness, numbness, ataxia    Past Medical History  He,  has a past medical history of Hypertension.   Surgical History   History reviewed. No pertinent surgical history.   Social History   reports that he has been smoking. He has been smoking about 1.00 pack per day. He has never used smokeless tobacco. He reports current alcohol use. He reports current drug use. Drug: Marijuana.   Family History   His family history is not on file.   Allergies Allergies  Allergen Reactions  . Reglan [Metoclopramide]     Develops akathisia     Home Medications  Prior to Admission medications   Medication Sig  Start Date End Date Taking? Authorizing Provider  acetaminophen (TYLENOL) 500 MG tablet Take 2 tablets (1,000 mg total) by mouth every 8 (eight) hours as needed. Patient not taking: Reported on 06/21/2019 12/17/17   Caccavale, Sophia, PA-C  cyclobenzaprine (FLEXERIL) 5 MG tablet Take 1 tablet (5 mg total) by mouth 2 (two) times daily as needed for muscle spasms. Patient not taking: Reported on 06/21/2019 12/17/17   Caccavale, Sophia, PA-C  ibuprofen (ADVIL,MOTRIN) 600 MG tablet Take 1 tablet (600 mg total) by mouth every 6 (six) hours as needed. Patient not taking: Reported on 06/21/2019 09/12/14   Julianne Rice, MD  lisinopril (PRINIVIL,ZESTRIL) 10 MG tablet Take 1 tablet (10 mg total) by mouth daily. Patient not taking: Reported on 06/21/2019 12/17/17   Caccavale, Sophia, PA-C  traMADol (ULTRAM) 50 MG tablet Take 1 tablet (50 mg total) by mouth every 6 (six) hours as needed. Patient not taking: Reported on 06/21/2019 09/12/14   Julianne Rice, MD     Critical care time: 52 mins      Georgann Housekeeper, AGACNP-BC [place] Pager [phone] or [phone]  06/22/2019 3:21 AM

## 2019-06-22 NOTE — Progress Notes (Signed)
CRITICAL VALUE ALERT  Critical Value:  Trop 6,239  Date & Time Notied:  06/22/19 1740  Continuing to trend troponin.

## 2019-06-22 NOTE — Progress Notes (Addendum)
PCCM progress note  Patient examined on morning rounds Still on Cardene drip.  Systolic blood pressure ranging from 120-140  Blood pressure (!) 151/88, pulse (!) 121, temperature 98.3 F (36.8 C), temperature source Oral, resp. rate (!) 22, height 5\' 9"  (1.753 m), weight 81.9 kg, SpO2 97 %. Gen:      No acute distress HEENT:  EOMI, sclera anicteric Neck:     No masses; no thyromegaly Lungs:    Clear to auscultation bilaterally; normal respiratory effort CV:         Regular rate and rhythm; no murmurs Abd:      + bowel sounds; soft, non-tender; no palpable masses, no distension Ext:    No edema; adequate peripheral perfusion Skin:      Warm and dry; no rash Neuro: Has mild confusion with no focal deficits  Labs reviewed.   Significant for troponin 606, BUN/creatinine 25/4.95 WBC 16.4  Assessment/plan: Hypertensive emergency in setting of noncompliance.  May have cocaine use although patient states that his last intake of cocaine was several weeks ago Elevated troponin- likely secondary to demand.  Wean off Cardene drip Start Norvasc Monitor renal function IV fluid hydration Follow troponin nd echo Discussed with neurology regarding CT head findings.  Changes are nonspecific and may be secondary to cocaine use.  Will get MRI head for further evaluation  The patient is critically ill with multiple organ system failure and requires high complexity decision making for assessment and support, frequent evaluation and titration of therapies, advanced monitoring, review of radiographic studies and interpretation of complex data.   Critical Care Time devoted to patient care services, exclusive of separately billable procedures, described in this note is 35 minutes.   Marshell Garfinkel MD [facility] Pager [phone] If no answer call [phone] 06/22/2019, 9:12 AM

## 2019-06-22 NOTE — ED Notes (Signed)
Admitting phys at bedside. 

## 2019-06-22 NOTE — Progress Notes (Signed)
  Echocardiogram 2D Echocardiogram has been performed.  William [name] 06/22/2019, 10:32 AM

## 2019-06-22 NOTE — Progress Notes (Signed)
Pt had 14 beat run of vtach. MD notified. No new orders at this time. Will continue to monitor.

## 2019-06-22 NOTE — Consult Note (Signed)
NEURO HOSPITALIST CONSULT NOTE   Requestig physician: Dr. Vaughan Browner   Reason for Consult: Acute encephalopathy   History obtained from:  Chart and wife  HPI:                                                                                                                                          William [name] is an 33 y.o. male with a history of HTN who initially presented with mild chest tightness and SOB for the past several days, with intermittent headaches. He was noted to be severely hypertensive up to 254/167 in the ED. Initially he was alert and oriented, responding appropriately to questions. Initially attempted to control with hydralazine but BP remained elevated so he was started on IV cardene drip.   Head CT showed no acute findings, patchy areas of low density in bilateral caudate, periventricular white matter and right thalamus that are nonspecific. Patient admitted to ICU for BP control. Noted to have mild confusion with no focal deficits.   Patient is able to tell me his name, however does not seem to be oriented to time or place. He did state that his last cocaine use was a few weeks ago and denied any recent drug use. He stated that he was unsure if he had any confusion before. He was able to tell me that his mother lives in the area and that he is living with his wife. Is not sure why he is in the hospital. He was not able to answer any more questions.   Contacted wife who reports that this is not his baseline. She states that on Sunday they went to a friends funeral viewing, drank plenty of fluids at that time. Around 1PM she notice that he only ate a small portion of his food, last time he ate his food. Decreased appetite since that time. He stated that he was constipated, he got some castor oils, coffee, laxatives, still was unable to use the bathroom. He did not complain of issues with urination. He was more sluggish and decreased energy and strength. He was  taking advil for headaches. No recent sick contacts or recent travel.    He was a heavy drinker before, has not been a heavy drinking for a few weeks, very heavy drinker 4-5 years ago, slowed down significantly. Has only had one 1 beer. Used to have some shaky hands from that. Has not any recent drug use. Last use was about 1 month ago. She states that there was no recent use that she knew of.    Past Medical History:  Diagnosis Date  . Hypertension     History reviewed. No pertinent surgical history.  No family history on file.  Family History: Mother Unknown Father Hypertension  Social History:  reports that he has been smoking. He has been smoking about 1.00 pack per day. He has never used smokeless tobacco. He reports current alcohol use. He reports current drug use. Drug: Marijuana.  Allergies  Allergen Reactions  . Reglan [Metoclopramide]     Develops akathisia    MEDICATIONS:                                                                                                                     Scheduled: . amLODipine  5 mg Oral Daily  . potassium chloride  40 mEq Oral BID   Continuous: . lactated ringers    . lactated ringers    . lactated ringers 100 mL/hr at 06/22/19 1500  . niCARDipine 12 mg/hr (06/22/19 1500)   PRN:   ROS:                                                                                                                                       History obtained from unobtainable from patient due to mental status  General ROS: negative for - chills, fatigue, fever, night sweats, weight gain or weight loss Psychological ROS: negative for - behavioral disorder, hallucinations, memory difficulties, mood swings or suicidal ideation Ophthalmic ROS: negative for - blurry vision, double vision, eye pain or loss of vision ENT ROS: negative for - epistaxis, nasal discharge, oral lesions, sore throat, tinnitus or vertigo Allergy and Immunology ROS:  negative for - hives or itchy/watery eyes Hematological and Lymphatic ROS: negative for - bleeding problems, bruising or swollen lymph nodes Endocrine ROS: negative for - galactorrhea, hair pattern changes, polydipsia/polyuria or temperature intolerance Respiratory ROS: negative for - cough, hemoptysis, shortness of breath or wheezing Cardiovascular ROS: negative for - chest pain, dyspnea on exertion, edema or irregular heartbeat Gastrointestinal ROS: negative for - abdominal pain, diarrhea, hematemesis, nausea/vomiting or stool incontinence Genito-Urinary ROS: negative for - dysuria, hematuria, incontinence or urinary frequency/urgency Musculoskeletal ROS: negative for - joint swelling or muscular weakness Neurological ROS: as noted in HPI Dermatological ROS: negative for rash and skin lesion changes   Blood pressure (!) 157/100, pulse 97, temperature 98.3 F (36.8 C), temperature source Oral, resp. rate 15, height 5\' 9"  (1.753 m), weight 81.9 kg, SpO2 98 %.   General Examination:  Physical Exam  HEENT-  Normocephalic, no lesions, without obvious abnormality.  Normal external eye and conjunctiva.   Cardiovascular- Tachycardic, systolic murmur Lungs-no rhonchi or wheezing noted, no excessive working breathing.  Saturations within normal limits Abdomen- All 4 quadrants palpated and nontender Extremities- Warm, dry and intact Musculoskeletal-no joint tenderness, deformity or swelling Skin-warm and dry, no hyperpigmentation, vitiligo, or suspicious lesions  Neurological Examination Mental Status: Awake, oriented to person, not place or time, able to answer questions with intermittent confusion, speech fluent, falling asleep, intermittently following commands Cranial Nerves: II: Visual fields grossly intact, tracks, discs flat bilaterally III,IV, VI: No ptosis present, EOMI, PERRLA   V,VII: smile symmetric, facial light touch sensation normal bilaterally VIII: hearing normal bilaterally IX,X: uvula rises symmetrically XI: bilateral shoulder shrug XII: midline tongue extension Motor: Right : Upper extremity   5/5    Left:     Upper extremity   5/5  Lower extremity   5/5     Lower extremity   5/5 Tone and bulk:normal tone throughout; no atrophy noted Sensory: Sensation to light touch intact throughout  Deep Tendon Reflexes: 1+ and symmetric throughout Plantars: Right: downgoing   Left: downgoing Cerebellar: Normal finger-to-nose on right side, did not participate on left side Gait: Did not assess   Lab Results: Basic Metabolic Panel: Recent Labs  Lab 06/21/19 2354 06/22/19 0744 06/22/19 1324  NA 133* 132* 134*  K 2.8* 2.5* 3.2*  CL 93* 93* 97*  CO2 27 23 24   GLUCOSE 140* 141* 112*  BUN 25* 27* 29*  CREATININE 4.95* 5.29* 5.43*  CALCIUM 9.1 9.1 9.1  MG  --   --  2.3    CBC: Recent Labs  Lab 06/21/19 2354 06/22/19 0744  WBC 12.8* 16.4*  HGB 14.9 14.8  HCT 41.6 41.6  MCV 88.7 87.6  PLT 210 236    Cardiac Enzymes: No results for input(s): CKTOTAL, CKMB, CKMBINDEX, TROPONINI in the last 168 hours.  Lipid Panel: No results for input(s): CHOL, TRIG, HDL, CHOLHDL, VLDL, LDLCALC in the last 168 hours.  Imaging: Dg Chest 2 View  Result Date: 06/21/2019 CLINICAL DATA:  Dyspnea for 3 days, smoker EXAM: CHEST - 2 VIEW COMPARISON:  None. FINDINGS: Normal heart size. Normal mediastinal contour. No pneumothorax. No pleural effusion. Lungs appear clear, with no acute consolidative airspace disease and no pulmonary edema. IMPRESSION: No active cardiopulmonary disease. Electronically Signed   By: Ilona Sorrel M.D.   On: 06/21/2019 17:21   Ct Head Wo Contrast  Result Date: 06/22/2019 CLINICAL DATA:  Headache. Hypertensive emergency. Significantly elevated blood pressure. EXAM: CT HEAD WITHOUT CONTRAST TECHNIQUE: Contiguous axial images were obtained from the  base of the skull through the vertex without intravenous contrast. COMPARISON:  None. FINDINGS: Brain: No evidence of acute infarction, hemorrhage, hydrocephalus, extra-axial collection or mass lesion/mass effect. Patchy areas of low-density in the bilateral caudate, periventricular white matter, and right thalamus are nonspecific. Vascular: No hyperdense vessel or unexpected calcification. Skull: No fracture or focal lesion. Sinuses/Orbits: Small mucous retention cyst in both maxillary sinuses. The paranasal sinuses and mastoid air cells are otherwise clear. The visualized orbits are unremarkable. Other: None. IMPRESSION: 1. Patchy areas of low-density in the bilateral caudate, periventricular white matter, and right thalamus are nonspecific and may represent chronic microvascular ischemic changes,, however would be unusual for patient age. Demyelinating disease such as multiple sclerosis also considered. Consider MRI for further evaluation. 2. No acute hemorrhage. Electronically Signed   By: Keith Rake M.D.   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 Headache. Abnormal CT appearance of the brain earlier today. EXAM: MRI HEAD WITHOUT CONTRAST TECHNIQUE: Multiplanar, multiecho pulse sequences of the brain and surrounding structures were obtained without intravenous contrast. COMPARISON:  Head CT 0209 hours today. FINDINGS: Brain: There are fairly numerous mostly small areas of restricted diffusion scattered in the brainstem and bilateral hemispheres. Among the largest areas is that in the medial left periatrial white matter near the splenium of the corpus callosum on series 2, image 26. There is involvement of the bilateral deep white matter capsules. There are a few scattered areas of small cortical involvement. There is scattered subcortical white matter involvement. And there is globular restricted diffusion in  multiple areas of the pons and the pontomedullary junction (series 2, images 12-15). Superimposed on the abnormal pontine diffusion is diffuse pontomedullary T2 and FLAIR hyperintensity with expansion and generally facilitated diffusion. And there is a similar abnormal appearance of the medial thalami (series 5, image 14). Extensive superimposed bilateral T2 and FLAIR hyperintensity in the basal ganglia appears to represent a combination of acute and chronic abnormality, with small areas of cystic encephalomalacia. There is no posterior circulation predominant cortical gray and white matter involvement typical of posterior reversible encephalopathy syndrome (PRES). Questionable subtle acute involvement of the cerebellum. There is also a tiny chronic right cerebellar infarct on series 5, image 8. There are superimposed chronic microhemorrhages in the bilateral deep gray matter nuclei and pons. Also, there is volume loss and confluent abnormal T2 and FLAIR hyperintensity in the body of the corpus callosum (series 11, image 111. Additionally, bilateral periventricular white matter T2 and FLAIR hyperintensity is somewhat nodular and oriented perpendicular to the lateral ventricles (such as on series 11, image 136). No contrast administered. No midline shift, mass effect, evidence of mass lesion, ventriculomegaly, extra-axial collection or acute intracranial hemorrhage. Cervicomedullary junction and pituitary are within normal limits. Vascular: Major intracranial vascular flow voids are preserved, the left vertebral artery appears dominant. Skull and upper cervical spine: Negative visible cervical spine. Visualized bone marrow signal is within normal limits. Sinuses/Orbits: Negative orbits. Mild paranasal sinus mucosal thickening. Other: Mastoids remain clear. Scalp and face soft tissues appear negative. IMPRESSION: 1. Widespread abnormal findings in the brain. 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 - severe T2 hyperintensity and expansion of the pons, medulla, and the medial thalami. 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 - superimposed corpus callosum and cerebral white matter disease which in some ways resembles demyelination. - no associated acute hemorrhage. No significant intracranial mass effect at this time. 2. Although some of the typical features are absent, Severe Posterior Reversible Encephalopathy Syndrome (PRES) should be considered, and could also explain the scattered acute infarcts. Other differential considerations would include more than one acute disease process, such as Osmotic Demyelination combined with Acute Emboli. Acute infectious encephalitis also is less likely. 3. Recommend Neurology consultation. Electronically Signed   By: Genevie Ann M.D.   On: 06/22/2019 14:10    Assessment This is a 33 year old male with a history of hypertension who initially presented with chest tightness, shortness of breath, and intermittent headaches.  Wife also noted decreased energy, sluggish appearing, and constipation.  Patient has history of cocaine use but wife and patient denied any recent use.  Noted to be  severely hypertensive up to 250/167 and started on IV Cardene drip and transferred to the ICU. Initially was alert and oriented but has had decline in mental status while admitted.  He was noted to be afebrile.  Labs significant for leukocytosis of 12.8, sodium 133, potassium 2.8, creatinine 4.95 (last Cr was in 1/19 at 1.12). COVID negative Chest x-ray was unremarkable.  UDS and urinalysis was ordered however patient has not urinated since admission so this had not been collected.  Given abnormal findings on CT, MRI was performed.   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  Today patient is still confused appearing, able to tell answer some orientation questions but very sluggish.  On exam he has no focal neurological deficits noted.  Given his acute and chronic findings on MRI he must have been having recurrent insults.  One possibility of his findings could be due to cocaine use, will need to obtain UDS to evaluate for this.  He also may have had significantly uncontrolled hypertension that caused these small vessel disease.  Patient also could be suffering from an acute CVA that is resulting in his significant hypertension however since no focal neurological deficits are noted this seems less likely.  Patient continues to have significantly elevated blood pressure today, still occasionally in the 003 systolic range. If cocaine is ruled out then patient should be evaluated for causes of his hypertension, such as thyrotoxicosis, renal artery stenosis, or vasculitis.   Impression: Acute encephalopathy 2/2 metabolic encephalopathy vs PRES  Hypertensive emergency  Recommendations: -MR angiogram head and neck -Start ASA 325, then 81 mg daily -Strict BP control -Continue correcting electrolytes -Continue IV fluids, monitor kidney function -Obtain UDS and U/A, may need in and out cath to obtain, consider renal u/s -Further recommendations per attending  Asencion Noble, M.D. PGY2 06/22/2019 5:12 PM

## 2019-06-23 ENCOUNTER — Inpatient Hospital Stay (HOSPITAL_COMMUNITY): Payer: Medicaid Other | Admitting: Critical Care Medicine

## 2019-06-23 ENCOUNTER — Encounter (HOSPITAL_COMMUNITY): Payer: Self-pay | Admitting: *Deleted

## 2019-06-23 ENCOUNTER — Inpatient Hospital Stay (HOSPITAL_COMMUNITY): Payer: Medicaid Other

## 2019-06-23 ENCOUNTER — Encounter (HOSPITAL_COMMUNITY)
Admission: EM | Disposition: A | Discharge status: Rehabilitation Facility | Payer: Self-pay | Source: Home / Self Care | Attending: Family Medicine

## 2019-06-23 DIAGNOSIS — F1410 Cocaine abuse, uncomplicated: Secondary | ICD-10-CM

## 2019-06-23 DIAGNOSIS — I6330 Cerebral infarction due to thrombosis of unspecified cerebral artery: Secondary | ICD-10-CM | POA: Insufficient documentation

## 2019-06-23 HISTORY — PX: RADIOLOGY WITH ANESTHESIA: SHX6223

## 2019-06-23 HISTORY — DX: Cocaine abuse, uncomplicated: F14.10

## 2019-06-23 LAB — COMPREHENSIVE METABOLIC PANEL
ALT: 18 U/L (ref 0–44)
AST: 59 U/L — ABNORMAL HIGH (ref 15–41)
Albumin: 3.2 g/dL — ABNORMAL LOW (ref 3.5–5.0)
Alkaline Phosphatase: 67 U/L (ref 38–126)
Anion gap: 12 (ref 5–15)
BUN: 26 mg/dL — ABNORMAL HIGH (ref 6–20)
CO2: 25 mmol/L (ref 22–32)
Calcium: 9.1 mg/dL (ref 8.9–10.3)
Chloride: 103 mmol/L (ref 98–111)
Creatinine, Ser: 5.36 mg/dL — ABNORMAL HIGH (ref 0.61–1.24)
GFR calc Af Amer: 15 mL/min — ABNORMAL LOW (ref >60)
GFR calc non Af Amer: 13 mL/min — ABNORMAL LOW (ref >60)
Glucose, Bld: 106 mg/dL — ABNORMAL HIGH (ref 70–99)
Potassium: 3.1 mmol/L — ABNORMAL LOW (ref 3.5–5.1)
Sodium: 140 mmol/L (ref 135–145)
Total Bilirubin: 1.1 mg/dL (ref 0.3–1.2)
Total Protein: 6.3 g/dL — ABNORMAL LOW (ref 6.5–8.1)

## 2019-06-23 LAB — TROPONIN I (HIGH SENSITIVITY)
Troponin I (High Sensitivity): 4761 ng/L (ref <18)
Troponin I (High Sensitivity): 7153 ng/L (ref <18)
Troponin I (High Sensitivity): 9189 ng/L (ref <18)

## 2019-06-23 LAB — PHOSPHORUS: Phosphorus: 2.8 mg/dL (ref 2.5–4.6)

## 2019-06-23 LAB — LIPID PANEL
Cholesterol: 253 mg/dL — ABNORMAL HIGH (ref 0–200)
HDL: 67 mg/dL (ref >40)
LDL Cholesterol: 158 mg/dL — ABNORMAL HIGH (ref 0–99)
Total CHOL/HDL Ratio: 3.8 RATIO
Triglycerides: 142 mg/dL (ref <150)
VLDL: 28 mg/dL (ref 0–40)

## 2019-06-23 LAB — CBC
HCT: 35.1 % — ABNORMAL LOW (ref 39.0–52.0)
Hemoglobin: 12 g/dL — ABNORMAL LOW (ref 13.0–17.0)
MCH: 31.5 pg (ref 26.0–34.0)
MCHC: 34.2 g/dL (ref 30.0–36.0)
MCV: 92.1 fL (ref 80.0–100.0)
Platelets: 228 10*3/uL (ref 150–400)
RBC: 3.81 MIL/uL — ABNORMAL LOW (ref 4.22–5.81)
RDW: 12.6 % (ref 11.5–15.5)
WBC: 11.8 10*3/uL — ABNORMAL HIGH (ref 4.0–10.5)
nRBC: 0 % (ref 0.0–0.2)

## 2019-06-23 LAB — MAGNESIUM: Magnesium: 2.2 mg/dL (ref 1.7–2.4)

## 2019-06-23 LAB — PROCALCITONIN: Procalcitonin: 0.34 ng/mL

## 2019-06-23 SURGERY — MRI WITH ANESTHESIA
Anesthesia: General

## 2019-06-23 MED ORDER — DILTIAZEM HCL ER COATED BEADS 180 MG PO CP24
180.0000 mg | ORAL_CAPSULE | Freq: Every day | ORAL | Status: DC
  Administered 2019-06-23 – 2019-06-24 (×2): 180 mg via ORAL
  Filled 2019-06-23 (×2): qty 1

## 2019-06-23 MED ORDER — POTASSIUM CHLORIDE CRYS ER 20 MEQ PO TBCR
40.0000 meq | EXTENDED_RELEASE_TABLET | Freq: Once | ORAL | Status: AC
  Administered 2019-06-23: 40 meq via ORAL
  Filled 2019-06-23: qty 2

## 2019-06-23 MED ORDER — FENTANYL CITRATE (PF) 100 MCG/2ML IJ SOLN: 25.0000 ug | INTRAMUSCULAR | Status: DC | PRN

## 2019-06-23 MED ORDER — MEPERIDINE HCL 25 MG/ML IJ SOLN: 6.2500 mg | INTRAMUSCULAR | Status: DC | PRN

## 2019-06-23 MED ORDER — ONDANSETRON HCL 4 MG/2ML IJ SOLN: 4.0000 mg | Freq: Once | INTRAMUSCULAR | Status: DC | PRN

## 2019-06-23 NOTE — Progress Notes (Addendum)
Progress Note  Patient Name: William [name] Date of Encounter: 06/23/2019  Primary Cardiologist:   No primary care provider on file.   Subjective   Denies chest pain.  He is sitting up.  Very confused but answers few questions.   Inpatient Medications    Scheduled Meds:   stroke: mapping our early stages of recovery book   Does not apply Once   amLODipine  10 mg Oral Daily   aspirin  81 mg Oral Daily   Chlorhexidine Gluconate Cloth  6 each Topical Daily   Continuous Infusions:  lactated ringers 125 mL/hr at 06/23/19 0200   niCARDipine Stopped (06/22/19 2209)   PRN Meds: haloperidol lactate, hydrALAZINE   Vital Signs    Vitals:   06/23/19 0645 06/23/19 0700 06/23/19 0715 06/23/19 0720  BP: (!) 151/104 (!) 155/102 (!) 176/103 (!) 161/95  Pulse: 81 88 91 89  Resp: 19 17 18 17   Temp:      TempSrc:      SpO2: 99% 95% 97% 97%  Weight:      Height:        Intake/Output Summary (Last 24 hours) at 06/23/2019 0738 Last data filed at 06/23/2019 0700 Gross per 24 hour  Intake 5104.71 ml  Output 600 ml  Net 4504.71 ml   Filed Weights   06/21/19 1648 06/22/19 0500 06/23/19 0500  Weight: 99.8 kg 81.9 kg 82.2 kg    Telemetry    NSR, no VT seen - Personally Reviewed  ECG    NA - Personally Reviewed  Physical Exam   GEN: No acute distress.   Neck: No  JVD Cardiac: RRR, no murmurs, rubs, or gallops.  Respiratory: Clear  to auscultation bilaterally. GI: Soft, nontender, non-distended  MS: No  edema; No deformity. Neuro:  Nonfocal  Psych: Normal affect   Labs    Chemistry Recent Labs  Lab 06/21/19 2354 06/22/19 0744 06/22/19 1324  NA 133* 132* 134*  K 2.8* 2.5* 3.2*  CL 93* 93* 97*  CO2 27 23 24   GLUCOSE 140* 141* 112*  BUN 25* 27* 29*  CREATININE 4.95* 5.29* 5.43*  CALCIUM 9.1 9.1 9.1  PROT 7.2  --   --   ALBUMIN 3.4*  --   --   AST 21  --   --   ALT 15  --   --   ALKPHOS 81  --   --   BILITOT 1.1  --   --   GFRNONAA 14* 13* 13*    GFRAA 16* 15* 15*  ANIONGAP 13 16* 13     Hematology Recent Labs  Lab 06/21/19 2354 06/22/19 0744 06/23/19 0607  WBC 12.8* 16.4* 11.8*  RBC 4.69 4.75 3.81*  HGB 14.9 14.8 12.0*  HCT 41.6 41.6 35.1*  MCV 88.7 87.6 92.1  MCH 31.8 31.2 31.5  MCHC 35.8 35.6 34.2  RDW 12.0 11.9 12.6  PLT 210 236 228    Cardiac EnzymesNo results for input(s): TROPONINI in the last 168 hours. No results for input(s): TROPIPOC in the last 168 hours.   BNPNo results for input(s): BNP, PROBNP in the last 168 hours.   DDimer No results for input(s): DDIMER in the last 168 hours.   Radiology    Dg Chest 2 View  Result Date: 06/21/2019 CLINICAL DATA:  Dyspnea for 3 days, smoker EXAM: CHEST - 2 VIEW COMPARISON:  None. FINDINGS: Normal heart size. Normal mediastinal contour. No pneumothorax. No pleural effusion. Lungs appear clear, with no acute consolidative airspace  disease and no pulmonary edema. IMPRESSION: No active cardiopulmonary disease. Electronically Signed   By: Ilona Sorrel M.D.   On: 06/21/2019 17:21   Ct Head Wo Contrast  Result Date: 06/22/2019 CLINICAL DATA:  Headache. Hypertensive emergency. Significantly elevated blood pressure. EXAM: CT HEAD WITHOUT CONTRAST TECHNIQUE: Contiguous axial images were obtained from the base of the skull through the vertex without intravenous contrast. COMPARISON:  None. FINDINGS: Brain: No evidence of acute infarction, hemorrhage, hydrocephalus, extra-axial collection or mass lesion/mass effect. Patchy areas of low-density in the bilateral caudate, periventricular white matter, and right thalamus are nonspecific. Vascular: No hyperdense vessel or unexpected calcification. Skull: No fracture or focal lesion. Sinuses/Orbits: Small mucous retention cyst in both maxillary sinuses. The paranasal sinuses and mastoid air cells are otherwise clear. The visualized orbits are unremarkable. Other: None. IMPRESSION: 1. Patchy areas of low-density in the bilateral caudate,  periventricular white matter, and right thalamus are nonspecific and may represent chronic microvascular ischemic changes,, however would be unusual for patient age. Demyelinating disease such as multiple sclerosis also considered. Consider MRI for further evaluation. 2. No acute hemorrhage. Electronically Signed   By: Keith Rake M.D.   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 Headache. Abnormal CT appearance of the brain earlier today. EXAM: MRI HEAD WITHOUT CONTRAST TECHNIQUE: Multiplanar, multiecho pulse sequences of the brain and surrounding structures were obtained without intravenous contrast. COMPARISON:  Head CT 0209 hours today. FINDINGS: Brain: There are fairly numerous mostly small areas of restricted diffusion scattered in the brainstem and bilateral hemispheres. Among the largest areas is that in the medial left periatrial white matter near the splenium of the corpus callosum on series 2, image 26. There is involvement of the bilateral deep white matter capsules. There are a few scattered areas of small cortical involvement. There is scattered subcortical white matter involvement. And there is globular restricted diffusion in multiple areas of the pons and the pontomedullary junction (series 2, images 12-15). Superimposed on the abnormal pontine diffusion is diffuse pontomedullary T2 and FLAIR hyperintensity with expansion and generally facilitated diffusion. And there is a similar abnormal appearance of the medial thalami (series 5, image 14). Extensive superimposed bilateral T2 and FLAIR hyperintensity in the basal ganglia appears to represent a combination of acute and chronic abnormality, with small areas of cystic encephalomalacia. There is no posterior circulation predominant cortical gray and white matter involvement typical of posterior reversible encephalopathy syndrome (PRES). Questionable subtle  acute involvement of the cerebellum. There is also a tiny chronic right cerebellar infarct on series 5, image 8. There are superimposed chronic microhemorrhages in the bilateral deep gray matter nuclei and pons. Also, there is volume loss and confluent abnormal T2 and FLAIR hyperintensity in the body of the corpus callosum (series 11, image 111. Additionally, bilateral periventricular white matter T2 and FLAIR hyperintensity is somewhat nodular and oriented perpendicular to the lateral ventricles (such as on series 11, image 136). No contrast administered. No midline shift, mass effect, evidence of mass lesion, ventriculomegaly, extra-axial collection or acute intracranial hemorrhage. Cervicomedullary junction and pituitary are within normal limits. Vascular: Major intracranial vascular flow voids are preserved, the left vertebral artery appears dominant. Skull and upper cervical spine: Negative visible cervical spine. Visualized bone marrow signal is within normal limits. Sinuses/Orbits: Negative orbits. Mild paranasal sinus mucosal thickening. Other: Mastoids remain clear. Scalp and face soft tissues appear negative. IMPRESSION: 1. Widespread abnormal findings in the brain. -  numerous scattered acute infarcts in both cerebral hemispheres and the brainstem. - severe T2 hyperintensity and expansion of the pons, medulla, and the medial thalami. 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 - superimposed corpus callosum and cerebral white matter disease which in some ways resembles demyelination. - no associated acute hemorrhage. No significant intracranial mass effect at this time. 2. Although some of the typical features are absent, Severe Posterior Reversible Encephalopathy Syndrome (PRES) should be considered, and could also explain the scattered acute infarcts. Other differential considerations would include  more than one acute disease process, such as Osmotic Demyelination combined with Acute Emboli. Acute infectious encephalitis also is less likely. 3. Recommend Neurology consultation. Electronically Signed   By: Genevie Ann M.D.   On: 06/22/2019 14:10   US Renal  Result Date: 06/23/2019 CLINICAL DATA:  Acute renal failure EXAM: RENAL / URINARY TRACT ULTRASOUND COMPLETE COMPARISON:  None. FINDINGS: Right Kidney: Renal measurements: 10.9 x 4.8 x 4.9 cm = volume: 137 mL. There is increased cortical echogenicity. There is no hydronephrosis. There are few hyperacute 0 ache areas within the upper pole and lower pole measuring up to approximately 2 cm Left Kidney: Renal measurements: 10.2 x 5 x 5.2 cm = volume: 136.8 mL. The echogenicity is increased. There is no hydronephrosis. There is a small hyperechoic area in the lower pole. Bladder: Appears normal for degree of bladder distention. IMPRESSION: 1. No hydronephrosis 2. Echogenic kidneys bilaterally which can be seen in patients with medical renal disease. 3. Wedge-shaped hyperechoic areas involving both kidneys. Differential considerations include bilateral renal infarcts or bilateral pyelonephritis. A renal neoplasm or renal laceration seems less likely. Follow-up is recommended to confirm resolution of the sonographic findings. Electronically Signed   By: Constance Holster M.D.   On: 06/23/2019 03:27    Cardiac Studies   ECHO   1. The left ventricle has hyperdynamic systolic function, with an ejection fraction of >65%. The cavity size was decreased. There is severe concentric left ventricular hypertrophy. Left ventricular diastolic parameters were normal. No evidence of left  ventricular regional wall motion abnormalities.  2. The right ventricle has normal systolic function. The cavity was normal. There is no increase in right ventricular wall thickness. Right ventricular systolic pressure could not be assessed.  3. Small pericardial effusion.  4. No  evidence of mitral valve stenosis.  5. The aortic valve is tricuspid. No stenosis of the aortic valve.  Patient Profile     33 y.o. male with a hx of HTN who is being seen for the evaluation of elevated troponin at the request of Dr Vaughan Browner.  Assessment & Plan    ELEVATED TROPONIN:  Likely demand ischemia with tachycardia, severe LVH and hypertensive urgency.  Repeat EKG  Avoid beta blockers with cocaine history.  On ASA.  No heparin as below.    HYPERTENSIVE URGENCY:  BP is improved and Nicardipine is off.   I will give Cardizem CD so we can titrate as needed.  Discontinue Norvasc  DYSLIPIDEMIA:  Can start Lipitor 40 mg.     ABNORMAL RUS:  Question medical renal disease.  Bilateral wedge shaped infarcts question bilateral embolism.    CNS:  Discussed MRI findings last night with neurology.  Despite the possibility of embolic events leading the the appearance of this markedly abnormal MRI the differential also includes multiple other etiologies.  It was felt that, without clear evidence of emboli,  that the risk of hemorrhagic conversion with heparin would be high and so this is not started.  Echo with out any embolic source and thus far there has been no evidence for endocarditis.   We will await further neuro eval to see if there is any indication for TEE.    For questions or updates, please contact [place] Please consult [email or web address] for contact info under Cardiology/STEMI.   Signed, Minus Breeding, MD  06/23/2019, 7:38 AM

## 2019-06-23 NOTE — Progress Notes (Signed)
   06/23/19 0200  What Happened  Was fall witnessed? No  Was patient injured? No  Patient found on floor  Found by Staff-comment Herbie [place])  Stated prior activity other (comment) (bedrest)  Follow Up  MD notified [place]  Time MD notified [place] notified Yes-comment (spouse)  Time family notified 0215

## 2019-06-23 NOTE — Evaluation (Signed)
Physical Therapy Evaluation Patient Details Name: William [name] MRN: [id number] DOB: 08-23-1986 Today's Date: 06/23/2019   History of Present Illness  33 year old male with a history of hypertension who initially presented with chest tightness, shortness of breath, and intermittent headaches.  Wife also noted decreased energy, sluggish appearing, and constipation. BP in ED 254/167. Admitted to ICU 06/22/19.  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 Pt with elevated troponin assessed by cardiology to be likely due to demand ischemia.   Clinical Impression  PTA pt independent living with wife and three children in house with stairs to enter. Pt currently limited in safe mobility by decreased cognition, and decreased coordination in presence of elevated BP. Pt is min guard for sitting EoB, and modAx2 for transfers and ambulation of 2 feet to recliner. Given pt PLOF and young age, pt would be a good candidate for CIR level rehab prior to return home. PT will continue to follow acutely.    Follow Up Recommendations CIR    Equipment Recommendations  Other (comment)(TBD)    Recommendations for Other Services Rehab consult     Precautions / Restrictions Precautions Precautions: Fall;Other (comment) Precaution Comments: Monitor BP  Restrictions Weight Bearing Restrictions: No      Mobility  Bed Mobility Overal bed mobility: Needs Assistance Bed Mobility: Supine to Sit     Supine to sit: Min guard     General bed mobility comments: min guard for safety, increased time and effort, disorganized movement  Transfers Overall transfer level: Needs assistance Equipment used: 2 person hand held assist Transfers: Sit to/from Stand Sit to Stand: Mod assist;+2 physical assistance         General transfer comment: modAx2 for steadying  with power up, pt with c/o dizziness and room spinning, and sat back down  Ambulation/Gait Ambulation/Gait assistance: Mod assist;+2 physical assistance Gait Distance (Feet): 2 Feet Assistive device: 2 person hand held assist Gait Pattern/deviations: Step-to pattern;Decreased step length - right;Decreased step length - left;Ataxic Gait velocity: slowe Gait velocity interpretation: <1.31 ft/sec, indicative of household ambulator General Gait Details: modAx2 for steadying with ataxic gait to recliner      Balance Overall balance assessment: Needs assistance Sitting-balance support: Bilateral upper extremity supported;No upper extremity supported;Single extremity supported;Feet supported;Feet unsupported Sitting balance-Leahy Scale: Fair     Standing balance support: Bilateral upper extremity supported Standing balance-Leahy Scale: Zero                               Pertinent Vitals/Pain Pain Assessment: No/denies pain    Home Living Family/patient expects to be discharged to:: Private residence Living Arrangements: Spouse/significant other;Children Available Help at Discharge: Family Type of Home: House           Additional Comments: Pt unable to recall details of home environment.  Pt reports he lives with his wife and three children, but he is unable to definitively state how many sons/daughters he has     Prior Function Level of Independence: Independent         Comments: Pt reports he was independent with ADLs, and worked at [employer]   Dominant Hand: Left    Extremity/Trunk Assessment   Upper Extremity Assessment Upper Extremity Assessment: Defer to OT evaluation    Lower Extremity Assessment Lower Extremity  Assessment: RLE deficits/detail;LLE deficits/detail RLE Deficits / Details: decreased ability to follow instructions, strength not vastly different on one side versus the other RLE Coordination:  decreased gross motor;decreased fine motor LLE Deficits / Details: decreased ability to follow instructions, strength not vastly different on one side versus the other LLE Coordination: decreased fine motor;decreased gross motor       Communication   Communication: No difficulties  Cognition Arousal/Alertness: Awake/alert Behavior During Therapy: Flat affect;Impulsive;Restless Overall Cognitive Status: Impaired/Different from baseline Area of Impairment: Orientation;Attention;Memory;Following commands;Safety/judgement;Awareness;Problem solving                 Orientation Level: Disoriented to;Place;Time;Situation Current Attention Level: Selective Memory: Decreased short-term memory Following Commands: Follows one step commands inconsistently;Follows one step commands with increased time Safety/Judgement: Decreased awareness of safety;Decreased awareness of deficits Awareness: Intellectual Problem Solving: Slow processing;Decreased initiation;Difficulty sequencing;Requires verbal cues;Requires tactile cues General Comments: Pt able to answer correctly about home set up, and then when asked again provides a different answer      General Comments General comments (skin integrity, edema, etc.): Pt with elevated BP throughout session supine 183/123, sitting 185/32, with transfer to chair 177/120        Assessment/Plan    PT Assessment Patient needs continued PT services  PT Problem List Decreased balance;Decreased mobility;Decreased coordination;Decreased cognition;Decreased strength;Decreased safety awareness;Decreased knowledge of use of DME;Cardiopulmonary status limiting activity       PT Treatment Interventions DME instruction;Gait training;Stair training;Functional mobility training;Therapeutic activities;Therapeutic exercise;Balance training;Neuromuscular re-education;Cognitive remediation;Patient/family education    PT Goals (Current goals can be found in the Care  Plan section)  Acute Rehab PT Goals Patient Stated Goal: none stated PT Goal Formulation: Patient unable to participate in goal setting Time For Goal Achievement: 07/07/19 Potential to Achieve Goals: Fair    Frequency Min 3X/week        Co-evaluation PT/OT/SLP Co-Evaluation/Treatment: Yes Reason for Co-Treatment: Complexity of the patient's impairments (multi-system involvement) PT goals addressed during session: Mobility/safety with mobility;Balance OT goals addressed during session: ADL's and self-care       AM-PAC PT "6 Clicks" Mobility  Outcome Measure Help needed turning from your back to your side while in a flat bed without using bedrails?: None Help needed moving from lying on your back to sitting on the side of a flat bed without using bedrails?: A Little Help needed moving to and from a bed to a chair (including a wheelchair)?: A Lot Help needed standing up from a chair using your arms (e.g., wheelchair or bedside chair)?: A Lot Help needed to walk in hospital room?: Total Help needed climbing 3-5 steps with a railing? : Total 6 Click Score: 13    End of Session Equipment Utilized During Treatment: Gait belt Activity Tolerance: Treatment limited secondary to medical complications (Comment)(elevated BP) Patient left: in chair;with call bell/phone within reach;with nursing/sitter in room;with chair alarm set Nurse Communication: Mobility status PT Visit Diagnosis: Unsteadiness on feet (R26.81);Other abnormalities of gait and mobility (R26.89);Muscle weakness (generalized) (M62.81);Ataxic gait (R26.0);Difficulty in walking, not elsewhere classified (R26.2);Other symptoms and signs involving the nervous system (R29.898);Dizziness and giddiness (R42)    Time: 7001-7494 PT Time Calculation (min) (ACUTE ONLY): 24 min   Charges:   PT Evaluation $PT Eval Moderate Complexity: 1 Mod          Noni Stonesifer B. Migdalia Dk PT, DPT Acute Rehabilitation Services Pager [phone] Office [phone]   [place] 06/23/2019, 1:10 PM

## 2019-06-23 NOTE — Evaluation (Signed)
Speech Language Pathology Evaluation Patient Details Name: William [name] MRN: [id number] DOB: 1986/08/09 Today's Date: 06/23/2019 Time: 1015-1040 SLP Time Calculation (min) (ACUTE ONLY): 25 min  Problem List:  Patient Active Problem List   Diagnosis Date Noted  . Hypertensive emergency 06/22/2019  . Acute renal failure [facility])    Past Medical History:  Past Medical History:  Diagnosis Date  . Hypertension    Past Surgical History: History reviewed. No pertinent surgical history. HPI:  33 year old male admitted 7/9 with hypertensive crisis and started on nicardipine infusion. Hx of recent substance cocaine abuse. 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 [name] administere 9/30 indicative of more moderate deficits. Deficits exhibited in executive function including self monitoring, sequening, organizing. Delayed recall, reduced attention, and decreased problem solving exhibited. Pt able to give some PLOF though no family at bedside to confer. Pt states he is married with three children whom he lives with. 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 Some anomic deficits were exhibited. Pt with mostly communicative at sentence level though exhibited some pausing and intermittent word finding difficulties. Receptive language skills appeared intact. Pt able to consistently follow simple commands. ST to follow up for cognitive linguistic deficits to maximize safety and independence.     SLP Assessment  SLP Recommendation/Assessment: Patient needs continued Speech Lanaguage Pathology Services SLP Visit Diagnosis: Cognitive communication deficit (R41.841)    Follow Up Recommendations       Frequency and Duration min 2x/week   2 weeks      SLP Evaluation Cognition  Overall Cognitive Status: Impaired/Different from baseline Arousal/Alertness: Awake/alert Orientation Level: Oriented to person;Oriented to place;Disoriented to time;Disoriented to situation Attention: Sustained Sustained Attention: Impaired Memory: Impaired Memory Impairment: Decreased short term memory;Decreased recall of new information Awareness: Impaired Problem Solving: Impaired Executive Function: Self Monitoring;Organizing;Initiating Organizing: Impaired Initiating: Impaired Self Monitoring: Impaired Behaviors: Restless Safety/Judgment: Impaired       Comprehension  Auditory Comprehension Overall Auditory Comprehension: Appears within functional limits for tasks assessed    Expression Expression Primary Mode of Expression: Verbal Verbal Expression Overall Verbal Expression: Impaired Initiation: No impairment Naming: Impairment Written Expression Dominant Hand: Left   Oral / Motor  Oral Motor/Sensory Function Overall Oral Motor/Sensory Function: Within functional limits Motor Speech Overall Motor Speech: Other (comment)(mild impairments with breath support reducing vocal intensit) Respiration: Impaired Level of Impairment: Sentence Phonation: Low vocal intensity   GO                    Miu Chiong E [name] MA, CCC-SLP Acute Rehabilitation Services  06/23/2019, 11:03 AM

## 2019-06-23 NOTE — Progress Notes (Signed)
Rehab Admissions Coordinator Note:  Patient was screened by Cleatrice Burke for appropriateness for an Inpatient Acute Rehab Consult per PT, OT, and SLP recs.   At this time, we are recommending Inpatient Rehab consult if pt would like to be considered for admit. Please advise.  Cleatrice Burke RN MSN 06/23/2019, 1:40 PM  I can be reached at [phone].

## 2019-06-23 NOTE — Progress Notes (Signed)
STROKE TEAM PROGRESS NOTE   INTERVAL HISTORY I have personally obtained history of presenting illness with the patient and reviewed electronic medical records as well as imaging films in PACS.  Patient is sitting up in bed.  Is slightly disoriented and has mild right-sided weakness.  He is on Cardene drip.  Vitals:   06/23/19 1145 06/23/19 1200 06/23/19 1214 06/23/19 1215  BP: (!) 176/115 (!) 171/113 (!) 171/113 (!) 183/115  Pulse: (!) 101 68  (!) 102  Resp: (!) 23 14  13   Temp:      TempSrc:      SpO2: 98% 100%  99%  Weight:      Height:        CBC:  Recent Labs  Lab 06/22/19 0744 06/23/19 0607  WBC 16.4* 11.8*  HGB 14.8 12.0*  HCT 41.6 35.1*  MCV 87.6 92.1  PLT 236 563    Basic Metabolic Panel:  Recent Labs  Lab 06/22/19 1324 06/22/19 1438 06/23/19 0607  NA 134*  --  140  K 3.2*  --  3.1*  CL 97*  --  103  CO2 24  --  25  GLUCOSE 112*  --  106*  BUN 29*  --  26*  CREATININE 5.43*  --  5.36*  CALCIUM 9.1  --  9.1  MG 2.3  --  2.2  PHOS  --  3.5 2.8   Lipid Panel:     Component Value Date/Time   CHOL 253 (H) 06/23/2019 0301   TRIG 142 06/23/2019 0301   HDL 67 06/23/2019 0301   CHOLHDL 3.8 06/23/2019 0301   VLDL 28 06/23/2019 0301   LDLCALC 158 (H) 06/23/2019 0301   HgbA1c: No results found for: HGBA1C Urine Drug Screen:     Component Value Date/Time   LABOPIA NONE DETECTED 06/22/2019 1820   COCAINSCRNUR POSITIVE (A) 06/22/2019 1820   LABBENZ POSITIVE (A) 06/22/2019 1820   AMPHETMU NONE DETECTED 06/22/2019 1820   THCU POSITIVE (A) 06/22/2019 1820   LABBARB NONE DETECTED 06/22/2019 1820    Alcohol Level No results found for: ETH  IMAGING Dg Chest 2 View  Result Date: 06/21/2019 CLINICAL DATA:  Dyspnea for 3 days, smoker EXAM: CHEST - 2 VIEW COMPARISON:  None. FINDINGS: Normal heart size. Normal mediastinal contour. No pneumothorax. No pleural effusion. Lungs appear clear, with no acute consolidative airspace disease and no pulmonary edema.  IMPRESSION: No active cardiopulmonary disease. Electronically Signed   By: Ilona Sorrel M.D.   On: 06/21/2019 17:21   Ct Head Wo Contrast  Result Date: 06/22/2019 CLINICAL DATA:  Headache. Hypertensive emergency. Significantly elevated blood pressure. EXAM: CT HEAD WITHOUT CONTRAST TECHNIQUE: Contiguous axial images were obtained from the base of the skull through the vertex without intravenous contrast. COMPARISON:  None. FINDINGS: Brain: No evidence of acute infarction, hemorrhage, hydrocephalus, extra-axial collection or mass lesion/mass effect. Patchy areas of low-density in the bilateral caudate, periventricular white matter, and right thalamus are nonspecific. Vascular: No hyperdense vessel or unexpected calcification. Skull: No fracture or focal lesion. Sinuses/Orbits: Small mucous retention cyst in both maxillary sinuses. The paranasal sinuses and mastoid air cells are otherwise clear. The visualized orbits are unremarkable. Other: None. IMPRESSION: 1. Patchy areas of low-density in the bilateral caudate, periventricular white matter, and right thalamus are nonspecific and may represent chronic microvascular ischemic changes,, however would be unusual for patient age. Demyelinating disease such as multiple sclerosis also considered. Consider MRI for further evaluation. 2. No acute hemorrhage. Electronically Signed   By:  Keith Rake M.D.   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 Headache. Abnormal CT appearance of the brain earlier today. EXAM: MRI HEAD WITHOUT CONTRAST TECHNIQUE: Multiplanar, multiecho pulse sequences of the brain and surrounding structures were obtained without intravenous contrast. COMPARISON:  Head CT 0209 hours today. FINDINGS: Brain: There are fairly numerous mostly small areas of restricted diffusion scattered in the brainstem and bilateral hemispheres. Among the largest areas is that  in the medial left periatrial white matter near the splenium of the corpus callosum on series 2, image 26. There is involvement of the bilateral deep white matter capsules. There are a few scattered areas of small cortical involvement. There is scattered subcortical white matter involvement. And there is globular restricted diffusion in multiple areas of the pons and the pontomedullary junction (series 2, images 12-15). Superimposed on the abnormal pontine diffusion is diffuse pontomedullary T2 and FLAIR hyperintensity with expansion and generally facilitated diffusion. And there is a similar abnormal appearance of the medial thalami (series 5, image 14). Extensive superimposed bilateral T2 and FLAIR hyperintensity in the basal ganglia appears to represent a combination of acute and chronic abnormality, with small areas of cystic encephalomalacia. There is no posterior circulation predominant cortical gray and white matter involvement typical of posterior reversible encephalopathy syndrome (PRES). Questionable subtle acute involvement of the cerebellum. There is also a tiny chronic right cerebellar infarct on series 5, image 8. There are superimposed chronic microhemorrhages in the bilateral deep gray matter nuclei and pons. Also, there is volume loss and confluent abnormal T2 and FLAIR hyperintensity in the body of the corpus callosum (series 11, image 111. Additionally, bilateral periventricular white matter T2 and FLAIR hyperintensity is somewhat nodular and oriented perpendicular to the lateral ventricles (such as on series 11, image 136). No contrast administered. No midline shift, mass effect, evidence of mass lesion, ventriculomegaly, extra-axial collection or acute intracranial hemorrhage. Cervicomedullary junction and pituitary are within normal limits. Vascular: Major intracranial vascular flow voids are preserved, the left vertebral artery appears dominant. Skull and upper cervical spine: Negative visible  cervical spine. Visualized bone marrow signal is within normal limits. Sinuses/Orbits: Negative orbits. Mild paranasal sinus mucosal thickening. Other: Mastoids remain clear. Scalp and face soft tissues appear negative. IMPRESSION: 1. Widespread abnormal findings in the brain. 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 - severe T2 hyperintensity and expansion of the pons, medulla, and the medial thalami. 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 - superimposed corpus callosum and cerebral white matter disease which in some ways resembles demyelination. - no associated acute hemorrhage. No significant intracranial mass effect at this time. 2. Although some of the typical features are absent, Severe Posterior Reversible Encephalopathy Syndrome (PRES) should be considered, and could also explain the scattered acute infarcts. Other differential considerations would include more than one acute disease process, such as Osmotic Demyelination combined with Acute Emboli. Acute infectious encephalitis also is less likely. 3. Recommend Neurology consultation. Electronically Signed   By: Genevie Ann M.D.   On: 06/22/2019 14:10   US Renal  Result Date: 06/23/2019 CLINICAL DATA:  Acute renal failure EXAM: RENAL / URINARY TRACT ULTRASOUND COMPLETE COMPARISON:  None. FINDINGS: Right Kidney: Renal measurements: 10.9 x 4.8 x 4.9 cm = volume: 137 mL. There is increased cortical echogenicity. There is no hydronephrosis.  There are few hyperacute 0 ache areas within the upper pole and lower pole measuring up to approximately 2 cm Left Kidney: Renal measurements: 10.2 x 5 x 5.2 cm = volume: 136.8 mL. The echogenicity is increased. There is no hydronephrosis. There is a small hyperechoic area in the lower pole. Bladder: Appears normal for degree of bladder distention. IMPRESSION: 1. No  hydronephrosis 2. Echogenic kidneys bilaterally which can be seen in patients with medical renal disease. 3. Wedge-shaped hyperechoic areas involving both kidneys. Differential considerations include bilateral renal infarcts or bilateral pyelonephritis. A renal neoplasm or renal laceration seems less likely. Follow-up is recommended to confirm resolution of the sonographic findings. Electronically Signed   By: Constance Holster M.D.   On: 06/23/2019 03:27    PHYSICAL EXAM Pleasant young male currently not in distress. . Afebrile. Head is nontraumatic. Neck is supple without bruit.    Cardiac exam no murmur or gallop. Lungs are clear to auscultation. Distal pulses are well felt. Neurological Exam : Awake alert oriented to place and person only.  Speech is clear without dysarthria.  Follows commands well.  Diminished attention, registration and recall.  Appears confused.  Extraocular movements are full range without nystagmus.  Blinks to threat bilaterally.  Face is symmetric without weakness.  Tongue midline.  Motor system exam reveals mild weakness of right grip and intrinsic hand muscles.  Mild right upper extremity drift.  Lower extremity strength is symmetric except for mild l right hip flexor and ankle dorsiflexor weakness.  Coordination slightly impaired on the right normal on the left.  Sensation is intact bilaterally.  Gait not tested. ASSESSMENT/PLAN [name] is a 33 y.o. male with history of hypertension who initially presented with mild chest tightness and shortness of breath for several days with intermittent headache.  Noted to be hypertensive.  CT found to have bilateral patchy abnormalities.  No history of alcohol abuse.  Cocaine positive.  Neuro consulted.  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  No hemorrhage  MRI widespread numerous scattered acute infarcts in both cerebral hemispheres and brainstem with severe T2 hyperintensity and expected of the pons, medulla and medial thalami evidence of underlying small vessel disease of the brain including microhemorrhages throughout.  Superimposed corpus callosum and cerebral white matter disease.  No hemorrhage.  MRA head pending  MRA neck pending  2D Echo EF greater than 65%.  No source of embolus.  Small pericardial effusion  LDL 158  HgbA1c pending  Urine drug screen positive for THC, cocaine and benzos  SCDs  for VTE prophylaxis  NPO  No antithrombotic prior to admission, now on aspirin 81 mg daily.  Once able to swallow, add dual antiplatelets x3 weeks with Plavix 75 mg then aspirin alone  Therapy recommendations: CLR   disposition: Pending  Hypertensive emergency  Blood pressure as high as 254/167   Treated with Cardene drip  Stable but elevated . Permissive hypertension (OK if < 220/120) but gradually normalize in 5-7 days . Long-term BP goal normotensive  Hyperlipidemia  Home meds: No statin  LDL 158, goal < 70  Add statin once able to swallow  Continue statin at discharge  Other Stroke Risk Factors  History of polysubstance drug abuse  Other Active Problems  AKI  Hypokalemia  Elevated troponin  Hospital day # 1  I have personally obtained history,examined this patient, reviewed notes, independently viewed imaging studies, participated in medical decision making and plan of care.ROS completed by me personally and pertinent positives fully documented  I have made any additions or clarifications directly to the above note.   He presented with a hypertensive encephalopathy and is doing quite well except for mild confusion and right-sided weakness and MRI shows multiple acute infarcts involving bilateral basal ganglia, periventricular white matter, thalamus and corpus callosum and pons along with cytotoxic edema of the brainstem likely from posterior reversible encephalopathy syndrome or mild osmotic disequilibrium syndrome given his baseline malnourished status, hyponatremia, renal failure.  Recommend strict blood pressure control and aggressive risk factor modification for stroke prevention.  I counseled the patient to quit smoking cigarettes, marijuana and cocaine.  Aspirin and Plavix for 3 weeks followed by aspirin alone.  Aggressive risk factor modification.  Taper Cardene drip and use oral blood pressure medications or as needed hydralazine.  For systolic blood pressure goal below 180 Long discussion with the patient as well as with Dr. Vaughan Browner from critical care medicine and patient at bedside RN and answered questions. Greater than 50% time during this 35-minute visit was spent on counseling and coordination of care about his abnormal MRI, uncontrolled vascular risk factors, substance abuse and planning treatment and answering questions     Antony Contras, MD Medical Director [place] Pager: [phone] 06/23/2019 2:23 PM   To contact Stroke Continuity provider, please refer to [email or web address] After hours, contact General Neurology

## 2019-06-23 NOTE — Progress Notes (Signed)
Pull out two IV with LR infusing. Will restart another site.

## 2019-06-23 NOTE — Progress Notes (Signed)
NAME:  William [name], MRN:  [id number], DOB:  10/19/1986, LOS: 1 ADMISSION DATE:  06/21/2019, CONSULTATION DATE:  06/22/2019 REFERRING MD:  Dr Leonides Schanz EDP, CHIEF COMPLAINT:  Hypertensive crisis  Brief History   33 year old male admitted 7/9 with hypertensive crisis and started on nicardipine infusion.  History of present illness   33 year old male with past medical history as below, which is significant for hypertension.  He is a poor, if not unwilling historian.  He is prescribed lisinopril, however, he has not taken it for about the last 6 months after he ran out.  He does admit to recently using cocaine.  He presented to [facility] emergency department in the late p.m. hours of 7/8 with complaints of shortness of breath that started just earlier in the day.  He denies cough, chest pain, headache, fever/chills.  He denies lightheadedness, dizziness, and blurry vision.  Blood pressure in the emergency department as high as 254/167.  He was given as needed hydralazine which was mostly ineffective and was started on nicardipine infusion.  His blood pressure did respond to this.  PCCM was asked to admit to ICU.  Past Medical History  - HTN  Significant Hospital Events   7/9 admitted  Consults:   7/9 Cardiology 7/9 Neurology Procedures:  None  Significant Diagnostic Tests:  CT of head 7/9 > Patchy areas of low-density in the bilateral caudate, periventricular white matter, and right thalamus are nonspecific and may represent chronic microvascular ischemic changes, however would be unusual for patient age. Demyelinating disease such as multiple sclerosis also considered.   MRI of head 7/9 > numerous scattered acute infarcts in both cerebral hemispheres and the brainstem. Evidence of underlying chronic small vessel disease in the brain, including chronic microhemorrhages in the deep gray matter nuclei and brainstem. No associated acute hemorrhage OR significant intracranial mass effect at this  time.  ECHO 06/22/2019 > The left ventricle has hyperdynamic systolic function, with an ejection fraction of >65%. The cavity size was decreased. There is severe concentric left ventricular hypertrophy. Left ventricular diastolic parameters were normal. No evidence of left  ventricular regional wall motion abnormalities;The right ventricle has normal systolic function. The cavity was normal. There is no increase in right ventricular wall thickness. Right ventricular systolic pressure could not be assessed; Small pericardial effusion; No evidence of mitral valve stenosis; The aortic valve is tricuspid. No stenosis of the aortic valve.  Micro Data:  COVID neg 7/9 BCx 7/9 >> Antimicrobials:  None  Interim history/subjective:  Overnight: - Systolic BP ranging from 401-027 - Fell overnight trying to get out of bed, atraumatic - Continues to have difficulty with cognition and Rt. Sided weakness  Objective   Blood pressure (!) 170/114, pulse 92, temperature 97.9 F (36.6 C), temperature source Axillary, resp. rate 17, height 5\' 9"  (1.753 m), weight 82.2 kg, SpO2 97 %.        Intake/Output Summary (Last 24 hours) at 06/23/2019 0912 Last data filed at 06/23/2019 0800 Gross per 24 hour  Intake 4980.59 ml  Output 1400 ml  Net 3580.59 ml   Filed Weights   06/21/19 1648 06/22/19 0500 06/23/19 0500  Weight: 99.8 kg 81.9 kg 82.2 kg    Examination: General: lying in bed in NAD. Orient to person and place, but not time. Delayed cognition. HENT: EOMI, PERRLA, MMM. Lungs: CTAB, normal WOB on RA Cardiovascular: RRR, Nl S1/S2, no m/r/g Abdomen: +BS x4 quadrants, NTND Extremities: tattoos across chest and arms Neuro: Rt. Sided drift with weakness (  distal hand > proximal UE) strength 5/5 in LUE and 3/5 in RUE; Strength 5/5 in LLE and 4/5 in RLE; sensation intact in bilateral UEs and LEs   Studies Renal US 7/9 IMPRESSION: No hydronephrosis; Echogenic kidneys bilaterally; Wedge-shaped hyperechoic  areas involving both kidneys concerning for bilateral renal infarcts v bilateral pyelonephritis  Resolved Hospital Problem list   X  Assessment & Plan:   #Neuro Abnormal Head CT - Patchy areas of low-density in the bilateral caudate, periventricular white matter, and right thalamus are nonspecific and may represent chronic microvascular ischemic changes, however would be unusual for patient age. Demyelinating disease such as multiple sclerosis also considered.  Abnormal Head MRI concerning for multiple ischemic strokes - numerous scattered acute infarcts in both cerebral hemispheres and the brainstem. Evidence of underlying chronic small vessel disease in the brain, including chronic microhemorrhages in the deep gray matter nuclei and brainstem. Superimposed corpus callosum and cerebral white matter disease which in some ways resembles demyelination. No associated acute hemorrhage OR significant intracranial mass effect at this time. - Severe Posterior Reversible Encephalopathy Syndrome (PRES) should be considered, and could also explain the scattered acute infarcts v. multiple acute disease processes (eg, Osmotic Demyelination combined with Acute Emboli). Plan - Neuro consulted and following, appreciate rec's - MR angiogram of head and neck - Continue 1:1 observation, if patient continue to be high fall risk - Haldol PRN, for agitation  #CARDIOVASCULAR  Hypertensive Emergency c/b AKI and chronic small vessel disease on head CT /MRI. Complicated by being off of BP medication for 76mo and recent cocaine use. UDS positive for cocaine and THC.  Elevated troponin.  Elevated troponin (6,239 > 4,731> 7,153) - 9,189 today. Mild elevation, likely demand ischemia.  LVH likely 2/2 chronic HTN - ECHO 7/9 The left ventricle has hyperdynamic systolic function, with an ejection fraction of >65%. The cavity size was decreased. There is severe concentric left ventricular hypertrophy. Plan - Cardiology  following, appreciate rec's - Continue Cardizem CD daily; Hydrazaline PRN - Hold home lisinopril given AKI - Continue to trend troponin - Continue Aspirin 325mg  PO daily. Holding off on heparin, given risk of hemorrhagic conversion with his markedly abnormal MRI  #RESPIRATORY - Mechanical ventilation is not indicated at this time as patient adequately protecting airway and sat'ing well on RA. - CTM  #HEM  Normocytic anemia. H/H decreased at 12 and 35.1, respectively. - CTM CBC, transfuse if Hgb<7  #RENAL AKI. Creatinine elevated to 5.36 with BUN on 26. Likely 2/2 to renal ischemia in the setting of drastically elevated BP. Renal US on 7/9 with Echogenic kidneys bilaterally without hydronephrosis; Wedge-shaped hyperechoic areas involving both kidneys concerning for bilateral renal infarcts v bilateral pyelonephritis.  Plan:  - BP control (see above) - Continue IVF hydration - CTM BMP and UOP   Best practice:  Diet: NPO Pain/Anxiety/Delirium protocol (if indicated): NA VAP protocol (if indicated): NA DVT prophylaxis: Aspirin GI prophylaxis: NA Glucose control: NA Mobility: Bed rest Code Status: FULL Family Communication: Communicated plan with patient Disposition: ICU  Roman Blount IV, MS - [employer] [facility] medical student   Medical screening examination/treatment/procedure(s) were conducted as a shared visit with non-physician practitioner(s) and myself.  I personally evaluated the patient during the encounter.  ---------------------------------------------------------------------------  Attending note: I have seen and examined the patient. History, labs and imaging reviewed.  Off Cardene drip.  Less confused today Neuro and cardiology consulted for abnormal MRI and elevated troponins respectively Making some urine  Blood pressure (!) 176/115, pulse (!) 101,  temperature 98.6 F (37 C), temperature source Oral, resp. rate (!) 23, height 5\' 9"  (1.753 m), weight 82.2 kg, SpO2 98 %.  Gen:      No acute distress HEENT:  EOMI, sclera anicteric Neck:     No masses; no thyromegaly Lungs:    Clear to auscultation bilaterally; normal respiratory effort CV:         Regular rate and rhythm; no murmurs Abd:      + bowel sounds; soft, non-tender; no palpable masses, no distension Ext:    No edema; adequate peripheral perfusion Skin:      Warm and dry; no rash Neuro: alert and oriented x 3 Psych: normal mood and affect   Labs reviewed, significant for Potassium 3.1, BUN/creatinine 26/5.36 WBC 11.8, hemoglobin 12 Urine drug screen-positive for cocaine, THC  Troponin-9189  Imaging MRI head 7/9-widespread abnormalities with acute infarct, white matter changes and chronic small vessel disease.  Renal ultrasound 7/10- wedge-shaped hypoechoic areas bilaterally.  Echo 7/9-severe LVH no wall motion abnormalities  Assessment/plan: 33 year old with hypertensive crisis, AKI, elevated troponins in the setting of noncompliance, cocaine use.  Noted to have encephalopathy with abnormal MRI  Hypertensive crisis Off Cardene drip.  Continue Cardizem, hydralazine PRN  Elevated troponin Not started on heparin due to concern for hemorrhagic conversion in brain Continue aspirin.  Cardiology on board May need ischemic eval before discharge. Consider TEE given concern for embolic phenomena.  AKI Making urine now.  Continue IV fluid hydration  Encephalopathy, abnormal MRI May be secondary to chronic cocaine use Continue neuro monitoring 1:1 sitter as he is a fall risk.  The patient is critically ill with multiple organ systems failure and requires high complexity decision making for assessment and support, frequent evaluation and titration of therapies, application of advanced monitoring technologies and extensive interpretation of multiple databases.  Critical care time - 35 mins. This represents my time independent of the NPs time taking care of the pt.  Marshell Garfinkel MD [place]  [facility] 06/23/2019, 12:06 PM

## 2019-06-23 NOTE — Anesthesia Preprocedure Evaluation (Deleted)
Anesthesia Evaluation  Patient identified by MRN, date of birth, ID band Patient awake    Reviewed: Allergy & Precautions, NPO status , Patient's Chart, lab work & pertinent test results  Airway Mallampati: II  TM Distance: >3 FB Neck ROM: Full    Dental  (+) Dental Advisory Given   Pulmonary neg pulmonary ROS, Current Smoker,    Pulmonary exam normal breath sounds clear to auscultation       Cardiovascular hypertension, Pt. on medications Normal cardiovascular exam Rhythm:Regular Rate:Normal     Neuro/Psych CVA negative psych ROS   GI/Hepatic negative GI ROS, Neg liver ROS,   Endo/Other  negative endocrine ROS  Renal/GU Renal disease     Musculoskeletal negative musculoskeletal ROS (+)   Abdominal   Peds  Hematology negative hematology ROS (+)   Anesthesia Other Findings   Reproductive/Obstetrics                            Anesthesia Physical Anesthesia Plan  ASA: III  Anesthesia Plan: General   Post-op Pain Management:    Induction: Intravenous  PONV Risk Score and Plan: 1 and Ondansetron and Treatment may vary due to age or medical condition  Airway Management Planned: Oral ETT  Additional Equipment:   Intra-op Plan:   Post-operative Plan: Extubation in OR  Informed Consent: I have reviewed the patients History and Physical, chart, labs and discussed the procedure including the risks, benefits and alternatives for the proposed anesthesia with the patient or authorized representative who has indicated his/her understanding and acceptance.     Consent reviewed with POA  Plan Discussed with: CRNA  Anesthesia Plan Comments:        Anesthesia Quick Evaluation

## 2019-06-23 NOTE — Evaluation (Signed)
Occupational Therapy Evaluation Patient Details Name: William [name] MRN: [id number] DOB: May 06, 1986 Today's Date: 06/23/2019    History of Present Illness 33 year old male with a history of hypertension who initially presented with chest tightness, shortness of breath, and intermittent headaches.  Wife also noted decreased energy, sluggish appearing, and constipation. BP in ED 254/167. Admitted to ICU 06/22/19.  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 Pt with elevated troponin assessed by cardiology to be likely due to demand ischemia.    Clinical Impression   Pt admitted with above. He demonstrates the below listed deficits and will benefit from continued OT to maximize safety and independence with BADLs.  Pt presents to OT with Rt inattention, impaired balance, decreased functional use of Rt UE, as well as significant cognitive impairment including impaired orientation, focused - brief periods of sustained attention, impaired problem solving, sequencing, awareness, and impaired memory.  He currently requires mod A, overall, for ADLs, and mod A +2 for functional mobility.  PTA, he lived with his wife and children.  He reports he worked at [employer], and also worked at a [employer].   Recommend CIR level rehab at discharge as he will benefit from a rehab team who specializes in stroke rehab to allow him  To maximize his safety and independence with ADLs.       Follow Up Recommendations  CIR;Supervision/Assistance - 24 hour    Equipment Recommendations  None recommended by OT    Recommendations for Other Services Rehab consult     Precautions / Restrictions Precautions Precautions: Fall;Other (comment) Precaution Comments: Monitor BP  Restrictions Weight Bearing Restrictions: No      Mobility Bed  Mobility Overal bed mobility: Needs Assistance Bed Mobility: Supine to Sit     Supine to sit: Min guard     General bed mobility comments: min guard for safety, increased time and effort, disorganized movement  Transfers Overall transfer level: Needs assistance Equipment used: 2 person hand held assist Transfers: Sit to/from Stand Sit to Stand: Mod assist;+2 physical assistance         General transfer comment: modAx2 for steadying with power up, pt with c/o dizziness and room spinning, and sat back down    Balance Overall balance assessment: Needs assistance Sitting-balance support: Bilateral upper extremity supported;No upper extremity supported;Single extremity supported;Feet supported;Feet unsupported Sitting balance-Leahy Scale: Fair Sitting balance - Comments: requires close min guard assist    Standing balance support: Bilateral upper extremity supported Standing balance-Leahy Scale: Zero                             ADL either performed or assessed with clinical judgement   ADL Overall ADL's : Needs assistance/impaired Eating/Feeding: Supervision/ safety;Set up;Sitting   Grooming: Wash/dry hands;Wash/dry face;Oral care;Minimal assistance;Sitting Grooming Details (indicate cue type and reason): mod- max cues due to inattention  Upper Body Bathing: Moderate assistance;Sitting   Lower Body Bathing: Moderate assistance;Sit to/from stand   Upper Body Dressing : Moderate assistance;Sitting   Lower Body Dressing: Minimal assistance;Sit to/from stand Lower Body Dressing Details (indicate cue type and reason): able to don/doff socks with increased time and effort - highly distractable  Toilet Transfer: Moderate assistance;Stand-pivot;BSC   Toileting- Clothing Manipulation and Hygiene: Maximal assistance;Sit to/from stand       Functional mobility during ADLs: Moderate assistance;+2 for physical  assistance;+2 for safety/equipment General ADL Comments: Pt  requires assist due to impaired balance, Rt inattention, and poor attention      Vision Baseline Vision/History: Wears glasses Patient Visual Report: Other (comment)(Pt is unsure if vision has changed ) Additional Comments: Pt unable to sustain attention to adequately assess vision.  He demonstrates full EOMs. He will look to Rt when stimulation provided, but does demonstrate Lt gaze preference.  Pt reports he wears glasses, but is unsure why he wears them or when he wears them      Perception Perception Perception Tested?: Yes Perception Deficits: Inattention/neglect Inattention/Neglect: Does not attend to right side of body;Does not attend to right visual field Spatial deficits: Pt demonstrates Lt gaze preference.  Will use Rt UE when asked, and will look to Rt when stimulation provided    Praxis Praxis Praxis-Other Comments: difficult to accurately assess     Pertinent Vitals/Pain Pain Assessment: No/denies pain     Hand Dominance Left   Extremity/Trunk Assessment Upper Extremity Assessment Upper Extremity Assessment: Defer to OT evaluation RUE Deficits / Details: Difficult to accurately assess due to Rt inattention.  He demonstrates full AROM.  Initial strength testing reveals strength 3+/5; however, when bil. resistance provided, strength was slightly less than Lt to equal Lt (4/5-4+/5)   Lower Extremity Assessment Lower Extremity Assessment: RLE deficits/detail;LLE deficits/detail RLE Deficits / Details: decreased ability to follow instructions, strength not vastly different on one side versus the other RLE Coordination: decreased gross motor;decreased fine motor LLE Deficits / Details: decreased ability to follow instructions, strength not vastly different on one side versus the other LLE Coordination: decreased fine motor;decreased gross motor   Cervical / Trunk Assessment Cervical / Trunk Assessment: Other exceptions Cervical / Trunk Exceptions: Pt demonstrates Lt gaze  preference - maintains head/neck rotated to the Lt    Communication Communication Communication: No difficulties   Cognition Arousal/Alertness: Awake/alert Behavior During Therapy: Flat affect;Impulsive;Restless Overall Cognitive Status: Impaired/Different from baseline Area of Impairment: Orientation;Attention;Memory;Following commands;Safety/judgement;Awareness;Problem solving                 Orientation Level: Disoriented to;Place;Time;Situation Current Attention Level: Selective Memory: Decreased short-term memory Following Commands: Follows one step commands inconsistently;Follows one step commands with increased time Safety/Judgement: Decreased awareness of safety;Decreased awareness of deficits Awareness: Intellectual Problem Solving: Slow processing;Decreased initiation;Difficulty sequencing;Requires verbal cues;Requires tactile cues General Comments: Pt able to answer correctly about home set up, and then when asked again provides a different answer   General Comments  Pt with elevated BP throughout session supine 183/123, sitting 185/32, with transfer to chair 177/120    Exercises     Shoulder Instructions      Home Living Family/patient expects to be discharged to:: Private residence Living Arrangements: Spouse/significant other;Children Available Help at Discharge: Family Type of Home: House                           Additional Comments: Pt unable to recall details of home environment.  Pt reports he lives with his wife and three children, but he is unable to definitively state how many sons/daughters he has   Lives With: Spouse;Son;Daughter    Prior Functioning/Environment Level of Independence: Independent        Comments: Pt reports he was independent with ADLs, and worked at [employer]         OT Problem List: Decreased strength;Impaired balance (sitting and/or standing);Decreased activity tolerance;Impaired  vision/perception;Decreased coordination;Decreased cognition;Decreased safety  awareness;Decreased knowledge of use of DME or AE;Cardiopulmonary status limiting activity;Impaired UE functional use;Impaired sensation      OT Treatment/Interventions: Self-care/ADL training;DME and/or AE instruction;Therapeutic activities;Cognitive remediation/compensation;Visual/perceptual remediation/compensation;Patient/family education;Balance training    OT Goals(Current goals can be found in the care plan section) Acute Rehab OT Goals Patient Stated Goal: none stated OT Goal Formulation: With patient Time For Goal Achievement: 07/07/19 Potential to Achieve Goals: Good ADL Goals Pt Will Perform Grooming: with min assist;standing Pt Will Perform Upper Body Bathing: with supervision;sitting Pt Will Perform Lower Body Bathing: with min assist;sit to/from stand Pt Will Perform Upper Body Dressing: with supervision;sitting Pt Will Perform Lower Body Dressing: with min assist;sit to/from stand Pt Will Transfer to Toilet: with min assist;ambulating;bedside commode;grab bars;regular height toilet Pt Will Perform Toileting - Clothing Manipulation and hygiene: with min assist;sit to/from stand Additional ADL Goal #1: Pt will locate needed ADL items on his Rt with min cues Additional ADL Goal #2: Pt will be oriented x 4 with use of external cues  OT Frequency: Min 2X/week   Barriers to D/C:    unsure of family support        Co-evaluation PT/OT/SLP Co-Evaluation/Treatment: Yes Reason for Co-Treatment: Complexity of the patient's impairments (multi-system involvement) PT goals addressed during session: Mobility/safety with mobility;Balance OT goals addressed during session: ADL's and self-care      AM-PAC OT "6 Clicks" Daily Activity     Outcome Measure Help from another person eating meals?: A Little Help from another person taking care of personal grooming?: A Lot Help from another person toileting,  which includes using toliet, bedpan, or urinal?: A Lot Help from another person bathing (including washing, rinsing, drying)?: A Lot Help from another person to put on and taking off regular upper body clothing?: A Lot Help from another person to put on and taking off regular lower body clothing?: A Lot 6 Click Score: 13   End of Session Equipment Utilized During Treatment: Gait belt Nurse Communication: Mobility status  Activity Tolerance: Patient tolerated treatment well Patient left: in chair;with call bell/phone within reach;with chair alarm set;with nursing/sitter in room  OT Visit Diagnosis: Unsteadiness on feet (R26.81);Cognitive communication deficit (R41.841);Dizziness and giddiness (R42) Symptoms and signs involving cognitive functions: Cerebral infarction                Time: 4268-3419 OT Time Calculation (min): 32 min Charges:  OT General Charges $OT Visit: 1 Visit OT Evaluation $OT Eval Moderate Complexity: 1 Mod  Lucille Passy, OTR/L Acute Rehabilitation Services Pager [phone] Office [phone]   Lucille Passy M 06/23/2019, 1:34 PM

## 2019-06-24 DIAGNOSIS — I248 Other forms of acute ischemic heart disease: Secondary | ICD-10-CM

## 2019-06-24 DIAGNOSIS — N179 Acute kidney failure, unspecified: Secondary | ICD-10-CM

## 2019-06-24 DIAGNOSIS — I6330 Cerebral infarction due to thrombosis of unspecified cerebral artery: Secondary | ICD-10-CM

## 2019-06-24 LAB — CBC
HCT: 36.1 % — ABNORMAL LOW (ref 39.0–52.0)
Hemoglobin: 12.3 g/dL — ABNORMAL LOW (ref 13.0–17.0)
MCH: 31.8 pg (ref 26.0–34.0)
MCHC: 34.1 g/dL (ref 30.0–36.0)
MCV: 93.3 fL (ref 80.0–100.0)
Platelets: 237 10*3/uL (ref 150–400)
RBC: 3.87 MIL/uL — ABNORMAL LOW (ref 4.22–5.81)
RDW: 12.5 % (ref 11.5–15.5)
WBC: 10.3 10*3/uL (ref 4.0–10.5)
nRBC: 0 % (ref 0.0–0.2)

## 2019-06-24 LAB — TROPONIN I (HIGH SENSITIVITY): Troponin I (High Sensitivity): 6699 ng/L (ref <18)

## 2019-06-24 LAB — BASIC METABOLIC PANEL
Anion gap: 15 (ref 5–15)
BUN: 23 mg/dL — ABNORMAL HIGH (ref 6–20)
CO2: 23 mmol/L (ref 22–32)
Calcium: 8.8 mg/dL — ABNORMAL LOW (ref 8.9–10.3)
Chloride: 102 mmol/L (ref 98–111)
Creatinine, Ser: 5.3 mg/dL — ABNORMAL HIGH (ref 0.61–1.24)
GFR calc Af Amer: 15 mL/min — ABNORMAL LOW (ref >60)
GFR calc non Af Amer: 13 mL/min — ABNORMAL LOW (ref >60)
Glucose, Bld: 93 mg/dL (ref 70–99)
Potassium: 2.9 mmol/L — ABNORMAL LOW (ref 3.5–5.1)
Sodium: 140 mmol/L (ref 135–145)

## 2019-06-24 LAB — HEMOGLOBIN A1C
Hgb A1c MFr Bld: 4.9 % (ref 4.8–5.6)
Mean Plasma Glucose: 94 mg/dL

## 2019-06-24 LAB — PROCALCITONIN: Procalcitonin: 0.22 ng/mL

## 2019-06-24 MED ORDER — ATORVASTATIN CALCIUM 40 MG PO TABS
40.0000 mg | ORAL_TABLET | Freq: Every day | ORAL | Status: DC
  Administered 2019-06-24 – 2019-06-28 (×5): 40 mg via ORAL
  Filled 2019-06-24 (×5): qty 1

## 2019-06-24 MED ORDER — DILTIAZEM HCL ER COATED BEADS 240 MG PO CP24
240.0000 mg | ORAL_CAPSULE | Freq: Every day | ORAL | Status: DC
  Administered 2019-06-25: 240 mg via ORAL
  Filled 2019-06-24: qty 1

## 2019-06-24 NOTE — Progress Notes (Signed)
CSW acknowledges consult for substance use. CSW met with the patient at bedside and provided the resources. The patient felt that he did not have a drug problem but was receptive to the resources.   CSW will continue to follow.   Domenic Schwab, MSW, [place]

## 2019-06-24 NOTE — Plan of Care (Signed)
  Problem: Education: Goal: Knowledge of General Education information will improve Description: Including pain rating scale, medication(s)/side effects and non-pharmacologic comfort measures Outcome: Progressing   Problem: Health Behavior/Discharge Planning: Goal: Ability to manage health-related needs will improve Outcome: Progressing   Problem: Clinical Measurements: Goal: Ability to maintain clinical measurements within normal limits will improve Outcome: Progressing Goal: Will remain free from infection Outcome: Progressing Goal: Diagnostic test results will improve Outcome: Progressing Goal: Respiratory complications will improve Outcome: Progressing Goal: Cardiovascular complication will be avoided Outcome: Progressing   Problem: Activity: Goal: Risk for activity intolerance will decrease Outcome: Progressing   Problem: Nutrition: Goal: Adequate nutrition will be maintained Outcome: Progressing   Problem: Coping: Goal: Level of anxiety will decrease Outcome: Progressing   Problem: Elimination: Goal: Will not experience complications related to bowel motility Outcome: Progressing Goal: Will not experience complications related to urinary retention Outcome: Progressing   Problem: Pain Managment: Goal: General experience of comfort will improve Outcome: Progressing   Problem: Safety: Goal: Ability to remain free from injury will improve Outcome: Progressing   Problem: Skin Integrity: Goal: Risk for impaired skin integrity will decrease Outcome: Progressing   Problem: Education: Goal: Knowledge of disease or condition will improve Outcome: Progressing Goal: Knowledge of secondary prevention will improve Outcome: Progressing Goal: Knowledge of patient specific risk factors addressed and post discharge goals established will improve Outcome: Progressing   Problem: Nutrition: Goal: Risk of aspiration will decrease Outcome: Progressing Goal: Dietary intake  will improve Outcome: Progressing   Problem: Ischemic Stroke/TIA Tissue Perfusion: Goal: Complications of ischemic stroke/TIA will be minimized Outcome: Progressing

## 2019-06-24 NOTE — Progress Notes (Signed)
PROGRESS NOTE    William [name]  DXI:[id number] DOB: [birth year] DOA: 06/21/2019 PCP: Patient, No Pcp Per   Brief Narrative: Pierre Dellarocco is a 33 y.o. male with history of cocaine abuse, hyperlipidemia, hypertension.  Patient presented secondary to shortness of breath.  He was found to have significantly elevated blood pressure with evidence of kidney failure concerning for hypertensive emergency.  Patient was admitted to the ICU and placed on a Cardene drip.  Patient had associated mental status changes and imaging suggests multiple infarcts with possible evidence of PRE S.  Neurology is on board and helping to manage.  Cardiology was consulted as well to help manage hypertension in addition to elevated troponin.   Assessment & Plan:   Active Problems:   Hypertensive emergency   Acute renal failure (HCC)   Cerebral thrombosis with cerebral infarction   Hypertensive emergency Patient was initially better controlled while on Cardene drip.  He was placed on Cardizem in addition to and hydralazine IV PRN.  Blood pressure over the last 24 hours has been worsened.  Cardiology signed off.  Goal blood pressure is currently less than 220/120 per neurology recommendations -Increase Cardizem p.o. to 240 mg daily in a.m. -Continue hydralazine IV  Acute renal failure Patient with associated wedge-shaped areas of both kidneys concerning for bilateral renal infarcts versus bilateral pyelonephritis.  Etiology is likely renal ischemia.  Patient with good urine output.  Creatinine still significantly elevated with minimal improvement. -Continue IV fluids -Will likely need to consult nephrology  Acute CVA Bilateral anterior and posterior circulation infarcts.  Also evidence of edema of the brainstem concerning for PR ES versus osmotic demyelination.  Neurology was consulted.  Echo significant for now embolic source.  LDL of 158, hemoglobin A1c of 4.9%.  Patient with a history of cocaine  use. -Neurology recommendations: DAPT x3 weeks followed by aspirin alone -PT/OT recommending CIR: Consult placed  Demand ischemia Patient presented with increasingly worsening troponin.  Cardiology was consulted with initial plans for an eventual cardiac catheterization.  Etiology thought to be secondary to significant hypertension, severe LVH, cardiac.  Hyperlipidemia -Start Lipitor 40 mg daily  Cocaine abuse We will need to cease use on discharge.   DVT prophylaxis: SCDs Code Status:   Code Status: Full Code Family Communication: None Disposition Plan: Discharge to CIR once blood pressure better controlled   Consultants:   Cardiology  PCCM  Neurology/stroke team  Procedures:   06/22/2019: Echocardiogram IMPRESSIONS    1. The left ventricle has hyperdynamic systolic function, with an ejection fraction of >65%. The cavity size was decreased. There is severe concentric left ventricular hypertrophy. Left ventricular diastolic parameters were normal. No evidence of left  ventricular regional wall motion abnormalities.  2. The right ventricle has normal systolic function. The cavity was normal. There is no increase in right ventricular wall thickness. Right ventricular systolic pressure could not be assessed.  3. Small pericardial effusion.  4. No evidence of mitral valve stenosis.  5. The aortic valve is tricuspid. No stenosis of the aortic valve.  Antimicrobials:  None   Subjective: No concerns today.  No chest pain or dyspnea.  Objective: Vitals:   06/24/19 0420 06/24/19 0810 06/24/19 0900 06/24/19 1041  BP: (!) 162/105 (!) 200/104 (!) 207/118 (!) (P) 190/110  Pulse: 93 81 (!) 103 (!) (P) 109  Resp:  18 18 (P) 18  Temp: 97.8 F (36.6 C) (!) 97.4 F (36.3 C) 98.5 F (36.9 C)   TempSrc: Oral Axillary Axillary   SpO2:  100% 100% 100% (P) 100%  Weight:      Height:        Intake/Output Summary (Last 24 hours) at 06/24/2019 1147 Last data filed at 06/24/2019  0900 Gross per 24 hour  Intake 694.24 ml  Output 2200 ml  Net -1505.76 ml   Filed Weights   06/23/19 0500 06/23/19 1435 06/24/19 0236  Weight: 82.2 kg 82.2 kg 85.3 kg    Examination:  General exam: Appears calm and comfortable Respiratory system: Clear to auscultation. Respiratory effort normal. Cardiovascular system: S1 & S2 heard, RRR. No murmurs, rubs, gallops or clicks. Gastrointestinal system: Abdomen is nondistended, soft and nontender. No organomegaly or masses felt. Normal bowel sounds heard. Central nervous system: Alert and oriented to person place and year. 5 out of 5 strength bilaterally in upper and lower extremities Extremities: No edema. No calf tenderness Skin: No cyanosis. No rashes Psychiatry: Judgement and insight appear normal. Mood & affect appropriate.     Data Reviewed: I have personally reviewed following labs and imaging studies  CBC: Recent Labs  Lab 06/21/19 2354 06/22/19 0744 06/23/19 0607 06/24/19 0502  WBC 12.8* 16.4* 11.8* 10.3  HGB 14.9 14.8 12.0* 12.3*  HCT 41.6 41.6 35.1* 36.1*  MCV 88.7 87.6 92.1 93.3  PLT 210 236 228 979   Basic Metabolic Panel: Recent Labs  Lab 06/21/19 2354 06/22/19 0744 06/22/19 1324 06/22/19 1438 06/23/19 0607 06/24/19 0502  NA 133* 132* 134*  --  140 140  K 2.8* 2.5* 3.2*  --  3.1* 2.9*  CL 93* 93* 97*  --  103 102  CO2 27 23 24   --  25 23  GLUCOSE 140* 141* 112*  --  106* 93  BUN 25* 27* 29*  --  26* 23*  CREATININE 4.95* 5.29* 5.43*  --  5.36* 5.30*  CALCIUM 9.1 9.1 9.1  --  9.1 8.8*  MG  --   --  2.3  --  2.2  --   PHOS  --   --   --  3.5 2.8  --    GFR: Estimated Creatinine Clearance: 21.3 mL/min (A) (by C-G formula based on SCr of 5.3 mg/dL (H)). Liver Function Tests: Recent Labs  Lab 06/21/19 2354 06/23/19 0607  AST 21 59*  ALT 15 18  ALKPHOS 81 67  BILITOT 1.1 1.1  PROT 7.2 6.3*  ALBUMIN 3.4* 3.2*   No results for input(s): LIPASE, AMYLASE in the last 168 hours. No results for  input(s): AMMONIA in the last 168 hours. Coagulation Profile: No results for input(s): INR, PROTIME in the last 168 hours. Cardiac Enzymes: No results for input(s): CKTOTAL, CKMB, CKMBINDEX, TROPONINI in the last 168 hours. BNP (last 3 results) No results for input(s): PROBNP in the last 8760 hours. HbA1C: Recent Labs    06/23/19 0301  HGBA1C 4.9   CBG: Recent Labs  Lab 06/22/19 1739  GLUCAP 119*   Lipid Profile: Recent Labs    06/23/19 0301  CHOL 253*  HDL 67  LDLCALC 158*  TRIG 142  CHOLHDL 3.8   Thyroid Function Tests: No results for input(s): TSH, T4TOTAL, FREET4, T3FREE, THYROIDAB in the last 72 hours. Anemia Panel: No results for input(s): VITAMINB12, FOLATE, FERRITIN, TIBC, IRON, RETICCTPCT in the last 72 hours. Sepsis Labs: Recent Labs  Lab 06/22/19 1938 06/23/19 0607 06/24/19 0502  PROCALCITON 0.31 0.34 0.22    Recent Results (from the past 240 hour(s))  SARS Coronavirus 2 (CEPHEID - Performed in [facility] lab), [facility]  Order     Status: None   Collection Time: 06/22/19 12:20 AM   Specimen: Nasopharyngeal Swab  Result Value Ref Range Status   SARS Coronavirus 2 NEGATIVE NEGATIVE Final    Comment: (NOTE) If result is NEGATIVE SARS-CoV-2 target nucleic acids are NOT DETECTED. The SARS-CoV-2 RNA is generally detectable in upper and lower  respiratory specimens during the acute phase of infection. The lowest  concentration of SARS-CoV-2 viral copies this assay can detect is 250  copies / mL. A negative result does not preclude SARS-CoV-2 infection  and should not be used as the sole basis for treatment or other  patient management decisions.  A negative result may occur with  improper specimen collection / handling, submission of specimen other  than nasopharyngeal swab, presence of viral mutation(s) within the  areas targeted by this assay, and inadequate number of viral copies  (<250 copies / mL). A negative result must be combined with  clinical  observations, patient history, and epidemiological information. If result is POSITIVE SARS-CoV-2 target nucleic acids are DETECTED. The SARS-CoV-2 RNA is generally detectable in upper and lower  respiratory specimens dur ing the acute phase of infection.  Positive  results are indicative of active infection with SARS-CoV-2.  Clinical  correlation with patient history and other diagnostic information is  necessary to determine patient infection status.  Positive results do  not rule out bacterial infection or co-infection with other viruses. If result is PRESUMPTIVE POSTIVE SARS-CoV-2 nucleic acids MAY BE PRESENT.   A presumptive positive result was obtained on the submitted specimen  and confirmed on repeat testing.  While 2019 novel coronavirus  (SARS-CoV-2) nucleic acids may be present in the submitted sample  additional confirmatory testing may be necessary for epidemiological  and / or clinical management purposes  to differentiate between  SARS-CoV-2 and other Sarbecovirus currently known to infect humans.  If clinically indicated additional testing with an alternate test  methodology [phone]) is advised. The SARS-CoV-2 RNA is generally  detectable in upper and lower respiratory sp ecimens during the acute  phase of infection. The expected result is Negative. Fact Sheet for Patients:  StrictlyIdeas.no Fact Sheet for Healthcare Providers: BankingDealers.co.za This test is not yet approved or cleared by the [place] FDA and has been authorized for detection and/or diagnosis of SARS-CoV-2 by FDA under an Emergency Use Authorization (EUA).  This EUA will remain in effect (meaning this test can be used) for the duration of the COVID-19 declaration under Section 564(b)(1) of the Act, 21 U.S.C. section 360bbb-3(b)(1), unless the authorization is terminated or revoked sooner. Performed at [facility], [place]  [address] [place], [place] [address]   MRSA PCR Screening     Status: None   Collection Time: 06/22/19  5:28 AM   Specimen: Nasal Mucosa; Nasopharyngeal  Result Value Ref Range Status   MRSA by PCR NEGATIVE NEGATIVE Final    Comment:        The GeneXpert MRSA Assay (FDA approved for NASAL specimens only), is one component of a comprehensive MRSA colonization surveillance program. It is not intended to diagnose MRSA infection nor to guide or monitor treatment for MRSA infections. Performed at [facility], [place] [address] [place], [place] [address]   Culture, blood (Routine X 2) w Reflex to ID Panel     Status: None (Preliminary result)   Collection Time: 06/22/19  7:38 PM   Specimen: BLOOD  Result Value Ref Range Status   Specimen Description  BLOOD LEFT ANTECUBITAL  Final   Special Requests   Final    BOTTLES DRAWN AEROBIC ONLY Blood Culture adequate volume   Culture   Final    NO GROWTH < 24 HOURS Performed at [facility], [address] [place], [place] [address]    Report Status PENDING  Incomplete  Culture, blood (Routine X 2) w Reflex to ID Panel     Status: None (Preliminary result)   Collection Time: 06/22/19  7:38 PM   Specimen: BLOOD RIGHT HAND  Result Value Ref Range Status   Specimen Description BLOOD RIGHT HAND  Final   Special Requests   Final    BOTTLES DRAWN AEROBIC ONLY Blood Culture adequate volume   Culture   Final    NO GROWTH < 24 HOURS Performed at [facility], [place] [address] [place], 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 differential considerations including posterior reversible encephalopathy syndrome (PRES), osmotic demyelination, ischemic or embolic infarcts. EXAM: MRA NECK WITHOUT CONTRAST MRA HEAD  WITHOUT CONTRAST TECHNIQUE: Angiographic images of the neck were obtained using MRA technique without intravenous contast. Angiographic images of the Circle of Willis were obtained using MRA technique without intravenous contrast. COMPARISON:  Brain MRI 06/22/2019. FINDINGS: MRA NECK FINDINGS Time-of-flight neck MRA images reveal antegrade flow in both cervical carotid and vertebral arteries throughout the neck and to the skull base. The left vertebral might arise directly from the arch (series 4, image 184) and is mildly dominant in the neck. The carotid bifurcations appear normal. No carotid or vertebral artery stenosis identified in the neck. MRA HEAD FINDINGS No intracranial mass effect or ventriculomegaly. Antegrade flow in the posterior circulation with dominant left vertebral artery. The right vertebral is patent to the vertebrobasilar junction, but is diminutive beyond the PICA. Both PICA origins are patent. Patent basilar artery without stenosis. Normal SCA origins. Fetal type PCA origins, more so the right. Normal posterior communicating arteries. Bilateral PCA branches appear normal. Antegrade flow in both ICA siphons. No siphon stenosis. Normal ophthalmic and posterior communicating artery origins. Patent carotid termini. Normal MCA and ACA origins. The left A1 is dominant. Anterior communicating artery appears fenestrated (normal variant). Visible bilateral ACA branches appear normal. Left MCA M1 segment, bifurcation and visible left MCA branches are within normal limits. Right MCA M1, bifurcation, and visible right MCA branches appear normal. IMPRESSION: Negative MRA of the head and neck. Electronically Signed   By: Genevie Ann M.D.   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 differential considerations including posterior reversible  encephalopathy syndrome (PRES), osmotic demyelination, ischemic or embolic infarcts. EXAM: MRA NECK WITHOUT CONTRAST MRA HEAD WITHOUT CONTRAST TECHNIQUE: Angiographic images of the neck were obtained using MRA technique without intravenous contast. Angiographic images of the Circle of Willis were obtained using MRA technique without intravenous contrast. COMPARISON:  Brain MRI 06/22/2019. FINDINGS: MRA NECK FINDINGS Time-of-flight neck MRA images reveal antegrade flow in both cervical carotid and vertebral arteries throughout the neck and to the skull base. The left vertebral might arise directly from the arch (series 4, image 184) and is mildly dominant in  the neck. The carotid bifurcations appear normal. No carotid or vertebral artery stenosis identified in the neck. MRA HEAD FINDINGS No intracranial mass effect or ventriculomegaly. Antegrade flow in the posterior circulation with dominant left vertebral artery. The right vertebral is patent to the vertebrobasilar junction, but is diminutive beyond the PICA. Both PICA origins are patent. Patent basilar artery without stenosis. Normal SCA origins. Fetal type PCA origins, more so the right. Normal posterior communicating arteries. Bilateral PCA branches appear normal. Antegrade flow in both ICA siphons. No siphon stenosis. Normal ophthalmic and posterior communicating artery origins. Patent carotid termini. Normal MCA and ACA origins. The left A1 is dominant. Anterior communicating artery appears fenestrated (normal variant). Visible bilateral ACA branches appear normal. Left MCA M1 segment, bifurcation and visible left MCA branches are within normal limits. Right MCA M1, bifurcation, and visible right MCA branches appear normal. IMPRESSION: Negative MRA of the head and neck. Electronically Signed   By: Genevie Ann M.D.   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 Headache.  Abnormal CT appearance of the brain earlier today. EXAM: MRI HEAD WITHOUT CONTRAST TECHNIQUE: Multiplanar, multiecho pulse sequences of the brain and surrounding structures were obtained without intravenous contrast. COMPARISON:  Head CT 0209 hours today. FINDINGS: Brain: There are fairly numerous mostly small areas of restricted diffusion scattered in the brainstem and bilateral hemispheres. Among the largest areas is that in the medial left periatrial white matter near the splenium of the corpus callosum on series 2, image 26. There is involvement of the bilateral deep white matter capsules. There are a few scattered areas of small cortical involvement. There is scattered subcortical white matter involvement. And there is globular restricted diffusion in multiple areas of the pons and the pontomedullary junction (series 2, images 12-15). Superimposed on the abnormal pontine diffusion is diffuse pontomedullary T2 and FLAIR hyperintensity with expansion and generally facilitated diffusion. And there is a similar abnormal appearance of the medial thalami (series 5, image 14). Extensive superimposed bilateral T2 and FLAIR hyperintensity in the basal ganglia appears to represent a combination of acute and chronic abnormality, with small areas of cystic encephalomalacia. There is no posterior circulation predominant cortical gray and white matter involvement typical of posterior reversible encephalopathy syndrome (PRES). Questionable subtle acute involvement of the cerebellum. There is also a tiny chronic right cerebellar infarct on series 5, image 8. There are superimposed chronic microhemorrhages in the bilateral deep gray matter nuclei and pons. Also, there is volume loss and confluent abnormal T2 and FLAIR hyperintensity in the body of the corpus callosum (series 11, image 111. Additionally, bilateral periventricular white matter T2 and FLAIR hyperintensity is somewhat nodular and oriented perpendicular to the  lateral ventricles (such as on series 11, image 136). No contrast administered. No midline shift, mass effect, evidence of mass lesion, ventriculomegaly, extra-axial collection or acute intracranial hemorrhage. Cervicomedullary junction and pituitary are within normal limits. Vascular: Major intracranial vascular flow voids are preserved, the left vertebral artery appears dominant. Skull and upper cervical spine: Negative visible cervical spine. Visualized bone marrow signal is within normal limits. Sinuses/Orbits: Negative orbits. Mild paranasal sinus mucosal thickening. Other: Mastoids remain clear. Scalp and face soft tissues appear negative. IMPRESSION: 1. Widespread abnormal findings in the brain. 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 - severe T2 hyperintensity and expansion of the pons, medulla, and the medial thalami. 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 - superimposed corpus callosum and cerebral white matter disease which in some ways resembles demyelination. - no associated acute hemorrhage. No significant intracranial mass effect at this time. 2. Although some of the typical features are absent, Severe Posterior Reversible Encephalopathy Syndrome (PRES) should be considered, and could also explain the scattered acute infarcts. Other differential considerations would include more than one acute disease process, such as Osmotic Demyelination combined with Acute Emboli. Acute infectious encephalitis also is less likely. 3. Recommend Neurology consultation. Electronically Signed   By: Genevie Ann M.D.   On: 06/22/2019 14:10   US Renal  Result Date: 06/23/2019 CLINICAL DATA:  Acute renal failure EXAM: RENAL / URINARY TRACT ULTRASOUND COMPLETE COMPARISON:  None. FINDINGS: Right Kidney: Renal measurements: 10.9 x 4.8 x 4.9 cm = volume: 137 mL. There is  increased cortical echogenicity. There is no hydronephrosis. There are few hyperacute 0 ache areas within the upper pole and lower pole measuring up to approximately 2 cm Left Kidney: Renal measurements: 10.2 x 5 x 5.2 cm = volume: 136.8 mL. The echogenicity is increased. There is no hydronephrosis. There is a small hyperechoic area in the lower pole. Bladder: Appears normal for degree of bladder distention. IMPRESSION: 1. No hydronephrosis 2. Echogenic kidneys bilaterally which can be seen in patients with medical renal disease. 3. Wedge-shaped hyperechoic areas involving both kidneys. Differential considerations include bilateral renal infarcts or bilateral pyelonephritis. A renal neoplasm or renal laceration seems less likely. Follow-up is recommended to confirm resolution of the sonographic findings. Electronically Signed   By: Constance Holster M.D.   On: 06/23/2019 03:27        Scheduled Meds:  aspirin  81 mg Oral Daily   Chlorhexidine Gluconate Cloth  6 each Topical Daily   diltiazem  180 mg Oral Daily   Continuous Infusions:  lactated ringers 125 mL/hr at 06/23/19 0200     LOS: 2 days     Cordelia Poche, MD [facility] 06/24/2019, 11:47 AM  If 7PM-7AM, please contact night-coverage [email or web address]

## 2019-06-24 NOTE — Progress Notes (Addendum)
STROKE TEAM PROGRESS NOTE   INTERVAL HISTORY Patient is sitting up in bed. Nods nods no when asked if he is well. Patient is slightly disoriented, has a sitter, fell trying to get out of the bed, knows July, not day or date, follows some commands, answers some questions. Sitter and nurse at bedside. His BP is elevated > 200, he was on BP meds at home but hasn't taken them in "a while" and doesn't know what he was on.   Vitals:   06/24/19 0236 06/24/19 0420 06/24/19 0810 06/24/19 0900  BP:  (!) 162/105 (!) 200/104 (!) 207/118  Pulse:  93 81 (!) 103  Resp:   18 18  Temp:  97.8 F (36.6 C) (!) 97.4 F (36.3 C) 98.5 F (36.9 C)  TempSrc:  Oral Axillary Axillary  SpO2:  100% 100% 100%  Weight: 85.3 kg     Height:        CBC:  Recent Labs  Lab 06/23/19 0607 06/24/19 0502  WBC 11.8* 10.3  HGB 12.0* 12.3*  HCT 35.1* 36.1*  MCV 92.1 93.3  PLT 228 573    Basic Metabolic Panel:  Recent Labs  Lab 06/22/19 1324 06/22/19 1438 06/23/19 0607 06/24/19 0502  NA 134*  --  140 140  K 3.2*  --  3.1* 2.9*  CL 97*  --  103 102  CO2 24  --  25 23  GLUCOSE 112*  --  106* 93  BUN 29*  --  26* 23*  CREATININE 5.43*  --  5.36* 5.30*  CALCIUM 9.1  --  9.1 8.8*  MG 2.3  --  2.2  --   PHOS  --  3.5 2.8  --    Lipid Panel:     Component Value Date/Time   CHOL 253 (H) 06/23/2019 0301   TRIG 142 06/23/2019 0301   HDL 67 06/23/2019 0301   CHOLHDL 3.8 06/23/2019 0301   VLDL 28 06/23/2019 0301   LDLCALC 158 (H) 06/23/2019 0301   HgbA1c:  Lab Results  Component Value Date   HGBA1C 4.9 06/23/2019   Urine Drug Screen:     Component Value Date/Time   LABOPIA NONE DETECTED 06/22/2019 1820   COCAINSCRNUR POSITIVE (A) 06/22/2019 1820   LABBENZ POSITIVE (A) 06/22/2019 1820   AMPHETMU NONE DETECTED 06/22/2019 1820   THCU POSITIVE (A) 06/22/2019 1820   LABBARB NONE DETECTED 06/22/2019 1820    Alcohol Level No results found for: [facility]  IMAGING Mr Angio Head Wo Contrast  Result Date:  06/23/2019 CLINICAL DATA:  33 year old male with malignant hypertension and widespread abnormal brain signal on noncontrast head CT 06/22/2019. differential considerations including posterior reversible encephalopathy syndrome (PRES), osmotic demyelination, ischemic or embolic infarcts. EXAM: MRA NECK WITHOUT CONTRAST MRA HEAD WITHOUT CONTRAST TECHNIQUE: Angiographic images of the neck were obtained using MRA technique without intravenous contast. Angiographic images of the Circle of Willis were obtained using MRA technique without intravenous contrast. COMPARISON:  Brain MRI 06/22/2019. FINDINGS: MRA NECK FINDINGS Time-of-flight neck MRA images reveal antegrade flow in both cervical carotid and vertebral arteries throughout the neck and to the skull base. The left vertebral might arise directly from the arch (series 4, image 184) and is mildly dominant in the neck. The carotid bifurcations appear normal. No carotid or vertebral artery stenosis identified in the neck. MRA HEAD FINDINGS No intracranial mass effect or ventriculomegaly. Antegrade flow in the posterior circulation with dominant left vertebral artery. The right vertebral is patent to the vertebrobasilar junction, but  is diminutive beyond the PICA. Both PICA origins are patent. Patent basilar artery without stenosis. Normal SCA origins. Fetal type PCA origins, more so the right. Normal posterior communicating arteries. Bilateral PCA branches appear normal. Antegrade flow in both ICA siphons. No siphon stenosis. Normal ophthalmic and posterior communicating artery origins. Patent carotid termini. Normal MCA and ACA origins. The left A1 is dominant. Anterior communicating artery appears fenestrated (normal variant). Visible bilateral ACA branches appear normal. Left MCA M1 segment, bifurcation and visible left MCA branches are within normal limits. Right MCA M1, bifurcation, and visible right MCA branches appear normal. IMPRESSION: Negative MRA of the head  and neck. Electronically Signed   By: Genevie Ann M.D.   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 differential considerations including posterior reversible encephalopathy syndrome (PRES), osmotic demyelination, ischemic or embolic infarcts. EXAM: MRA NECK WITHOUT CONTRAST MRA HEAD WITHOUT CONTRAST TECHNIQUE: Angiographic images of the neck were obtained using MRA technique without intravenous contast. Angiographic images of the Circle of Willis were obtained using MRA technique without intravenous contrast. COMPARISON:  Brain MRI 06/22/2019. FINDINGS: MRA NECK FINDINGS Time-of-flight neck MRA images reveal antegrade flow in both cervical carotid and vertebral arteries throughout the neck and to the skull base. The left vertebral might arise directly from the arch (series 4, image 184) and is mildly dominant in the neck. The carotid bifurcations appear normal. No carotid or vertebral artery stenosis identified in the neck. MRA HEAD FINDINGS No intracranial mass effect or ventriculomegaly. Antegrade flow in the posterior circulation with dominant left vertebral artery. The right vertebral is patent to the vertebrobasilar junction, but is diminutive beyond the PICA. Both PICA origins are patent. Patent basilar artery without stenosis. Normal SCA origins. Fetal type PCA origins, more so the right. Normal posterior communicating arteries. Bilateral PCA branches appear normal. Antegrade flow in both ICA siphons. No siphon stenosis. Normal ophthalmic and posterior communicating artery origins. Patent carotid termini. Normal MCA and ACA origins. The left A1 is dominant. Anterior communicating artery appears fenestrated (normal variant). Visible bilateral ACA branches appear normal. Left MCA M1 segment, bifurcation and visible left MCA branches are within  normal limits. Right MCA M1, bifurcation, and visible right MCA branches appear normal. IMPRESSION: Negative MRA of the head and neck. Electronically Signed   By: Genevie Ann M.D.   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 Headache. Abnormal CT appearance of the brain earlier today. EXAM: MRI HEAD WITHOUT CONTRAST TECHNIQUE: Multiplanar, multiecho pulse sequences of the brain and surrounding structures were obtained without intravenous contrast. COMPARISON:  Head CT 0209 hours today. FINDINGS: Brain: There are fairly numerous mostly small areas of restricted diffusion scattered in the brainstem and bilateral hemispheres. Among the largest areas is that in the medial left periatrial white matter near the splenium of the corpus callosum on series 2, image 26. There is involvement of the bilateral deep white matter capsules. There are a few scattered areas of small cortical involvement. There is scattered subcortical white matter involvement. And there is globular restricted diffusion in multiple areas of the pons and the pontomedullary junction (series 2, images 12-15). Superimposed on the abnormal pontine diffusion is diffuse pontomedullary T2 and FLAIR hyperintensity with expansion and generally facilitated diffusion. And there is a similar abnormal appearance of  the medial thalami (series 5, image 14). Extensive superimposed bilateral T2 and FLAIR hyperintensity in the basal ganglia appears to represent a combination of acute and chronic abnormality, with small areas of cystic encephalomalacia. There is no posterior circulation predominant cortical gray and white matter involvement typical of posterior reversible encephalopathy syndrome (PRES). Questionable subtle acute involvement of the cerebellum. There is also a tiny chronic right cerebellar infarct on series 5, image 8. There are superimposed chronic microhemorrhages in  the bilateral deep gray matter nuclei and pons. Also, there is volume loss and confluent abnormal T2 and FLAIR hyperintensity in the body of the corpus callosum (series 11, image 111. Additionally, bilateral periventricular white matter T2 and FLAIR hyperintensity is somewhat nodular and oriented perpendicular to the lateral ventricles (such as on series 11, image 136). No contrast administered. No midline shift, mass effect, evidence of mass lesion, ventriculomegaly, extra-axial collection or acute intracranial hemorrhage. Cervicomedullary junction and pituitary are within normal limits. Vascular: Major intracranial vascular flow voids are preserved, the left vertebral artery appears dominant. Skull and upper cervical spine: Negative visible cervical spine. Visualized bone marrow signal is within normal limits. Sinuses/Orbits: Negative orbits. Mild paranasal sinus mucosal thickening. Other: Mastoids remain clear. Scalp and face soft tissues appear negative. IMPRESSION: 1. Widespread abnormal findings in the brain. 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 - severe T2 hyperintensity and expansion of the pons, medulla, and the medial thalami. 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 - superimposed corpus callosum and cerebral white matter disease which in some ways resembles demyelination. - no associated acute hemorrhage. No significant intracranial mass effect at this time. 2. Although some of the typical features are absent, Severe Posterior Reversible Encephalopathy Syndrome (PRES) should be considered, and could also explain the scattered acute infarcts. Other differential considerations would include more than one acute disease process, such as Osmotic Demyelination combined with Acute Emboli. Acute infectious encephalitis also is less likely. 3. Recommend  Neurology consultation. Electronically Signed   By: Genevie Ann M.D.   On: 06/22/2019 14:10   US Renal  Result Date: 06/23/2019 CLINICAL DATA:  Acute renal failure EXAM: RENAL / URINARY TRACT ULTRASOUND COMPLETE COMPARISON:  None. FINDINGS: Right Kidney: Renal measurements: 10.9 x 4.8 x 4.9 cm = volume: 137 mL. There is increased cortical echogenicity. There is no hydronephrosis. There are few hyperacute 0 ache areas within the upper pole and lower pole measuring up to approximately 2 cm Left Kidney: Renal measurements: 10.2 x 5 x 5.2 cm = volume: 136.8 mL. The echogenicity is increased. There is no hydronephrosis. There is a small hyperechoic area in the lower pole. Bladder: Appears normal for degree of bladder distention. IMPRESSION: 1. No hydronephrosis 2. Echogenic kidneys bilaterally which can be seen in patients with medical renal disease. 3. Wedge-shaped hyperechoic areas involving both kidneys. Differential considerations include bilateral renal infarcts or bilateral pyelonephritis. A renal neoplasm or renal laceration seems less likely. Follow-up is recommended to confirm resolution of the sonographic findings. Electronically Signed   By: Constance Holster M.D.   On: 06/23/2019 03:27    PHYSICAL EXAM Young male currently not in distress. . Afebrile. Head is nontraumatic. Neck is supple without bruit.    Cardiac exam no murmur or gallop. Lungs are clear to auscultation. Distal pulses are well felt. Neurological Exam : Slightly disoriented, follows some commands, knows name, says it is July, will  not answer date or year. Speech is clear without dysarthria.   Diminished attention, registration and recall. Extraocular movements are full range without nystagmus.  Blinks to threat bilaterally.  Face is symmetric without weakness.  Tongue midline.  Motor system exam reveals mild weakness of right grip and intrinsic hand muscles.  Mild right upper extremity drift.  Lower extremity strength is symmetric  except for mild  right hip flexor and ankle dorsiflexor weakness however may be poor effort due to slight confusion. Coordination slightly impaired on the right normal on the left.  Sensation is intact bilaterally.  Gait not tested . ASSESSMENT/PLAN [name] is a 33 y.o. male with history of hypertension who initially presented with mild chest tightness and shortness of breath for several days with intermittent headache.  Noted to be hypertensive.  CT found to have bilateral patchy abnormalities.  No history of alcohol abuse.  Cocaine positive.  Neuro consulted.  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  Abnormal MRI showing edema of the brainstem cytotoxic edema likely from posterior reversible encephalopathy syndrome versus osmotic demyelination given his borderline nutritional status, hyponatremia, renal failure and heavy alcohol intake  CT head patchy B caudate, PVWM, and R thalamic areas with microvascular ischemia.  No hemorrhage  MRI widespread numerous scattered acute infarcts in both cerebral hemispheres and brainstem with severe T2 hyperintensity and expected of the pons, medulla and medial thalami evidence of underlying small vessel disease of the brain including microhemorrhages throughout.  Superimposed corpus callosum and cerebral white matter disease.  No hemorrhage.  MRA: Negative MRA of the head and neck.  2D Echo EF greater than 65%.  No source of embolus.  Small pericardial effusion  LDL 158  HgbA1c 4.9  Urine drug screen positive for THC, cocaine and benzos  SCDs  for VTE prophylaxis  NPO  No antithrombotic prior to admission, now on aspirin 81 mg daily.  Once able to swallow, add dual antiplatelets x3 weeks with Plavix 75 mg then aspirin alone  Therapy recommendations: CLR   disposition: Pending  Hypertensive  emergency  Blood pressure as high as 254/167   Treated with Cardene drip not on Cardizem PO still elevated  Stable but elevated . Permissive hypertension (OK if < 220/120) but gradually normalize in 5-7 days . Long-term BP goal normotensive  Hyperlipidemia  Home meds: No statin  LDL 158, goal < 70  Add statin once able to swallow  Continue statin at discharge  Other Stroke Risk Factors  History of polysubstance drug abuse  Other Active Problems  AKI  Hypokalemia  Elevated troponin  Stroke will sign off at this time.  Please reconsult [place] if needed.  Hospital day # 2  Neurologically patient is stable, he presented with hypertensive encephalopathy MRI multiple acute infarcts likely due to metabolic disorders, medication noncompliance, cocaine use/polysubstance abuse.  Recommend strict blood pressure control and aggressive risk factor modification for stroke prevention.  Quit smoking, discussed getting into a program to help him with his polysubstance abuse.  Aspirin and Plavix for 3 weeks and then aspirin alone.  I had a d/w patient about recent stroke, risk for recurrent stroke/TIAs, personally independently reviewed imaging studies and stroke evaluation results and answered questions.asa and plavix for 3 weeks then asa alone for secondary stroke prevention and maintain strict control of hypertension with blood pressure goal below 130/90, diabetes with hemoglobin A1c goal below 6.5% and  lipids with LDL cholesterol goal below 70 mg/dL I also advised the patient to eat a healthy diet with plenty of whole grains, cereals, fruits and vegetables, exercise regularly and maintain ideal body weight, compliance with medication.  Personally examined patient and images, and have participated in and made any corrections needed to history, physical, neuro exam,assessment and plan as stated above.  I have personally obtained the history, evaluated lab date, reviewed imaging studies and agree  with radiology interpretations.    Sarina Ill, MD Stroke Neurology   A total of 25 minutes was spent for the care of this patient, spent on counseling patient and family on different diagnostic and therapeutic options, counseling and coordination of care, riskd ans benefits of management, compliance, or risk factor reduction and education.    To contact Stroke Continuity provider, please refer to [email or web address] After hours, contact General Neurology

## 2019-06-24 NOTE — Progress Notes (Signed)
Progress Note  Patient Name: William [name] Date of Encounter: 06/24/2019  Primary Cardiologist: Minus Breeding, MD   Subjective   Some confusion still noted  Inpatient Medications    Scheduled Meds:  aspirin  81 mg Oral Daily   Chlorhexidine Gluconate Cloth  6 each Topical Daily   diltiazem  180 mg Oral Daily   Continuous Infusions:  lactated ringers 125 mL/hr at 06/23/19 0200   PRN Meds: haloperidol lactate, hydrALAZINE   Vital Signs    Vitals:   06/24/19 0420 06/24/19 0810 06/24/19 0900 06/24/19 1041  BP: (!) 162/105 (!) 200/104 (!) 207/118 (!) (P) 190/110  Pulse: 93 81 (!) 103 (!) (P) 109  Resp:  18 18 (P) 18  Temp: 97.8 F (36.6 C) (!) 97.4 F (36.3 C) 98.5 F (36.9 C)   TempSrc: Oral Axillary Axillary   SpO2: 100% 100% 100% (P) 100%  Weight:      Height:        Intake/Output Summary (Last 24 hours) at 06/24/2019 1105 Last data filed at 06/24/2019 0900 Gross per 24 hour  Intake 694.24 ml  Output 2200 ml  Net -1505.76 ml   Last 3 Weights 06/24/2019 06/23/2019 06/23/2019  Weight (lbs) 188 lb 181 lb 3.5 oz 181 lb 3.5 oz  Weight (kg) 85.276 kg 82.2 kg 82.2 kg      Telemetry    No adverse arrhythmias- Personally Reviewed  ECG    No new- Personally Reviewed  Physical Exam   GEN: No acute distress.   Neck: No JVD Cardiac: RRR, no murmurs, rubs, or gallops.  Respiratory: Clear to auscultation bilaterally. GI: Soft, nontender, non-distended  MS: No edema; No deformity. Neuro:  Nonfocal  Psych: Normal affect, confusion  Labs    High Sensitivity Troponin:   Recent Labs  Lab 06/22/19 1938 06/22/19 2305 06/23/19 0301 06/23/19 0607 06/24/19 0502  TROPONINIHS 4,731* 4,761* 7,153* 9,189* 6,699*      Cardiac EnzymesNo results for input(s): TROPONINI in the last 168 hours. No results for input(s): TROPIPOC in the last 168 hours.   Chemistry Recent Labs  Lab 06/21/19 2354  06/22/19 1324 06/23/19 0607 06/24/19 0502  NA 133*   < > 134*  140 140  K 2.8*   < > 3.2* 3.1* 2.9*  CL 93*   < > 97* 103 102  CO2 27   < > 24 25 23   GLUCOSE 140*   < > 112* 106* 93  BUN 25*   < > 29* 26* 23*  CREATININE 4.95*   < > 5.43* 5.36* 5.30*  CALCIUM 9.1   < > 9.1 9.1 8.8*  PROT 7.2  --   --  6.3*  --   ALBUMIN 3.4*  --   --  3.2*  --   AST 21  --   --  59*  --   ALT 15  --   --  18  --   ALKPHOS 81  --   --  67  --   BILITOT 1.1  --   --  1.1  --   GFRNONAA 14*   < > 13* 13* 13*  GFRAA 16*   < > 15* 15* 15*  ANIONGAP 13   < > 13 12 15    < > = values in this interval not displayed.     Hematology Recent Labs  Lab 06/22/19 0744 06/23/19 0607 06/24/19 0502  WBC 16.4* 11.8* 10.3  RBC 4.75 3.81* 3.87*  HGB 14.8 12.0* 12.3*  HCT 41.6 35.1* 36.1*  MCV 87.6 92.1 93.3  MCH 31.2 31.5 31.8  MCHC 35.6 34.2 34.1  RDW 11.9 12.6 12.5  PLT 236 228 237    BNPNo results for input(s): BNP, PROBNP in the last 168 hours.   DDimer No results for input(s): DDIMER in the last 168 hours.   Radiology    Mr Angio Head Wo Contrast  Result Date: 06/23/2019 CLINICAL DATA:  33 year old male with malignant hypertension and widespread abnormal brain signal on noncontrast head CT 06/22/2019. differential considerations including posterior reversible encephalopathy syndrome (PRES), osmotic demyelination, ischemic or embolic infarcts. EXAM: MRA NECK WITHOUT CONTRAST MRA HEAD WITHOUT CONTRAST TECHNIQUE: Angiographic images of the neck were obtained using MRA technique without intravenous contast. Angiographic images of the Circle of Willis were obtained using MRA technique without intravenous contrast. COMPARISON:  Brain MRI 06/22/2019. FINDINGS: MRA NECK FINDINGS Time-of-flight neck MRA images reveal antegrade flow in both cervical carotid and vertebral arteries throughout the neck and to the skull base. The left vertebral might arise directly from the arch (series 4, image 184) and is mildly dominant in the neck. The carotid bifurcations appear normal. No  carotid or vertebral artery stenosis identified in the neck. MRA HEAD FINDINGS No intracranial mass effect or ventriculomegaly. Antegrade flow in the posterior circulation with dominant left vertebral artery. The right vertebral is patent to the vertebrobasilar junction, but is diminutive beyond the PICA. Both PICA origins are patent. Patent basilar artery without stenosis. Normal SCA origins. Fetal type PCA origins, more so the right. Normal posterior communicating arteries. Bilateral PCA branches appear normal. Antegrade flow in both ICA siphons. No siphon stenosis. Normal ophthalmic and posterior communicating artery origins. Patent carotid termini. Normal MCA and ACA origins. The left A1 is dominant. Anterior communicating artery appears fenestrated (normal variant). Visible bilateral ACA branches appear normal. Left MCA M1 segment, bifurcation and visible left MCA branches are within normal limits. Right MCA M1, bifurcation, and visible right MCA branches appear normal. IMPRESSION: Negative MRA of the head and neck. Electronically Signed   By: Genevie Ann M.D.   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 differential considerations including posterior reversible encephalopathy syndrome (PRES), osmotic demyelination, ischemic or embolic infarcts. EXAM: MRA NECK WITHOUT CONTRAST MRA HEAD WITHOUT CONTRAST TECHNIQUE: Angiographic images of the neck were obtained using MRA technique without intravenous contast. Angiographic images of the Circle of Willis were obtained using MRA technique without intravenous contrast. COMPARISON:  Brain MRI 06/22/2019. FINDINGS: MRA NECK FINDINGS Time-of-flight neck MRA images reveal antegrade flow in both cervical carotid and vertebral arteries throughout the neck and to the skull base. The left vertebral might arise  directly from the arch (series 4, image 184) and is mildly dominant in the neck. The carotid bifurcations appear normal. No carotid or vertebral artery stenosis identified in the neck. MRA HEAD FINDINGS No intracranial mass effect or ventriculomegaly. Antegrade flow in the posterior circulation with dominant left vertebral artery. The right vertebral is patent to the vertebrobasilar junction, but is diminutive beyond the PICA. Both PICA origins are patent. Patent basilar artery without stenosis. Normal SCA origins. Fetal type PCA origins, more so the right. Normal posterior communicating arteries. Bilateral PCA branches appear normal. Antegrade flow in both ICA siphons. No siphon stenosis. Normal ophthalmic and posterior communicating artery origins. Patent carotid termini. Normal MCA and ACA origins. The  left A1 is dominant. Anterior communicating artery appears fenestrated (normal variant). Visible bilateral ACA branches appear normal. Left MCA M1 segment, bifurcation and visible left MCA branches are within normal limits. Right MCA M1, bifurcation, and visible right MCA branches appear normal. IMPRESSION: Negative MRA of the head and neck. Electronically Signed   By: Genevie Ann M.D.   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 Headache. Abnormal CT appearance of the brain earlier today. EXAM: MRI HEAD WITHOUT CONTRAST TECHNIQUE: Multiplanar, multiecho pulse sequences of the brain and surrounding structures were obtained without intravenous contrast. COMPARISON:  Head CT 0209 hours today. FINDINGS: Brain: There are fairly numerous mostly small areas of restricted diffusion scattered in the brainstem and bilateral hemispheres. Among the largest areas is that in the medial left periatrial white matter near the splenium of the corpus callosum on series 2, image 26. There is involvement of the bilateral deep white matter capsules.  There are a few scattered areas of small cortical involvement. There is scattered subcortical white matter involvement. And there is globular restricted diffusion in multiple areas of the pons and the pontomedullary junction (series 2, images 12-15). Superimposed on the abnormal pontine diffusion is diffuse pontomedullary T2 and FLAIR hyperintensity with expansion and generally facilitated diffusion. And there is a similar abnormal appearance of the medial thalami (series 5, image 14). Extensive superimposed bilateral T2 and FLAIR hyperintensity in the basal ganglia appears to represent a combination of acute and chronic abnormality, with small areas of cystic encephalomalacia. There is no posterior circulation predominant cortical gray and white matter involvement typical of posterior reversible encephalopathy syndrome (PRES). Questionable subtle acute involvement of the cerebellum. There is also a tiny chronic right cerebellar infarct on series 5, image 8. There are superimposed chronic microhemorrhages in the bilateral deep gray matter nuclei and pons. Also, there is volume loss and confluent abnormal T2 and FLAIR hyperintensity in the body of the corpus callosum (series 11, image 111. Additionally, bilateral periventricular white matter T2 and FLAIR hyperintensity is somewhat nodular and oriented perpendicular to the lateral ventricles (such as on series 11, image 136). No contrast administered. No midline shift, mass effect, evidence of mass lesion, ventriculomegaly, extra-axial collection or acute intracranial hemorrhage. Cervicomedullary junction and pituitary are within normal limits. Vascular: Major intracranial vascular flow voids are preserved, the left vertebral artery appears dominant. Skull and upper cervical spine: Negative visible cervical spine. Visualized bone marrow signal is within normal limits. Sinuses/Orbits: Negative orbits. Mild paranasal sinus mucosal thickening. Other: Mastoids remain  clear. Scalp and face soft tissues appear negative. IMPRESSION: 1. Widespread abnormal findings in the brain. 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 - severe T2 hyperintensity and expansion of the pons, medulla, and the medial thalami. 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 - superimposed corpus callosum and cerebral white matter disease which in some ways resembles demyelination. - no associated acute hemorrhage. No significant intracranial mass effect at this time. 2. Although some of the typical features are absent, Severe Posterior Reversible Encephalopathy Syndrome (PRES) should be considered, and could also explain the scattered acute infarcts. Other differential considerations would include more than one acute disease process, such as Osmotic Demyelination combined with Acute Emboli. Acute infectious encephalitis also is less likely. 3. Recommend Neurology consultation. Electronically Signed   By: Lemmie Evens  [place] Crane M.D.   On: 06/22/2019 14:10   US Renal  Result Date: 06/23/2019 CLINICAL DATA:  Acute renal failure EXAM: RENAL / URINARY TRACT ULTRASOUND COMPLETE COMPARISON:  None. FINDINGS: Right Kidney: Renal measurements: 10.9 x 4.8 x 4.9 cm = volume: 137 mL. There is increased cortical echogenicity. There is no hydronephrosis. There are few hyperacute 0 ache areas within the upper pole and lower pole measuring up to approximately 2 cm Left Kidney: Renal measurements: 10.2 x 5 x 5.2 cm = volume: 136.8 mL. The echogenicity is increased. There is no hydronephrosis. There is a small hyperechoic area in the lower pole. Bladder: Appears normal for degree of bladder distention. IMPRESSION: 1. No hydronephrosis 2. Echogenic kidneys bilaterally which can be seen in patients with medical renal disease. 3. Wedge-shaped hyperechoic areas involving both kidneys. Differential  considerations include bilateral renal infarcts or bilateral pyelonephritis. A renal neoplasm or renal laceration seems less likely. Follow-up is recommended to confirm resolution of the sonographic findings. Electronically Signed   By: Constance Holster M.D.   On: 06/23/2019 03:27    Cardiac Studies   Normal EF, LVH  Patient Profile     33 y.o. male seen for the evaluation of elevated troponin, uncontrolled hypertension, prior cocaine history  Assessment & Plan    Hypertensive urgency encephalopathy - Continues to be elevated.  Medications reviewed.  Will defer to primary team for hypertension management.  Dr. Percival Spanish discussed MRI results with neurology.  No evidence of endocarditis or embolic source at this time.  Heparin was not started.  Elevated troponin - Likely demand ischemia with tachycardia LVH.  Trying to avoid beta-blockers given cocaine history.  Fairly significant elevation, portends a worsened prognosis.  Hyperlipidemia -Lipitor.  Please let us know if we can be of further assistance.  We will sign off.      For questions or updates, please contact [place] Please consult [email or web address] for contact info under        Signed, Candee Furbish, MD  06/24/2019, 11:05 AM

## 2019-06-24 NOTE — Plan of Care (Signed)
Progressing towards goals

## 2019-06-25 ENCOUNTER — Inpatient Hospital Stay (HOSPITAL_COMMUNITY): Payer: Medicaid Other

## 2019-06-25 ENCOUNTER — Encounter (HOSPITAL_COMMUNITY): Payer: Self-pay | Admitting: Nephrology

## 2019-06-25 LAB — RENAL FUNCTION PANEL
Albumin: 3.3 g/dL — ABNORMAL LOW (ref 3.5–5.0)
Anion gap: 13 (ref 5–15)
BUN: 25 mg/dL — ABNORMAL HIGH (ref 6–20)
CO2: 25 mmol/L (ref 22–32)
Calcium: 9.1 mg/dL (ref 8.9–10.3)
Chloride: 97 mmol/L — ABNORMAL LOW (ref 98–111)
Creatinine, Ser: 5.21 mg/dL — ABNORMAL HIGH (ref 0.61–1.24)
GFR calc Af Amer: 15 mL/min — ABNORMAL LOW (ref >60)
GFR calc non Af Amer: 13 mL/min — ABNORMAL LOW (ref >60)
Glucose, Bld: 133 mg/dL — ABNORMAL HIGH (ref 70–99)
Phosphorus: 3.5 mg/dL (ref 2.5–4.6)
Potassium: 2.7 mmol/L — CL (ref 3.5–5.1)
Sodium: 135 mmol/L (ref 135–145)

## 2019-06-25 LAB — CBC
HCT: 35.8 % — ABNORMAL LOW (ref 39.0–52.0)
Hemoglobin: 12 g/dL — ABNORMAL LOW (ref 13.0–17.0)
MCH: 31 pg (ref 26.0–34.0)
MCHC: 33.5 g/dL (ref 30.0–36.0)
MCV: 92.5 fL (ref 80.0–100.0)
Platelets: 244 10*3/uL (ref 150–400)
RBC: 3.87 MIL/uL — ABNORMAL LOW (ref 4.22–5.81)
RDW: 12.2 % (ref 11.5–15.5)
WBC: 9.8 10*3/uL (ref 4.0–10.5)
nRBC: 0 % (ref 0.0–0.2)

## 2019-06-25 MED ORDER — HYDRALAZINE HCL 25 MG PO TABS
25.0000 mg | ORAL_TABLET | Freq: Three times a day (TID) | ORAL | Status: DC
  Administered 2019-06-25 – 2019-06-26 (×2): 25 mg via ORAL
  Filled 2019-06-25 (×2): qty 1

## 2019-06-25 MED ORDER — DILTIAZEM HCL 30 MG PO TABS
60.0000 mg | ORAL_TABLET | Freq: Four times a day (QID) | ORAL | Status: DC
  Administered 2019-06-25 – 2019-06-27 (×7): 60 mg via ORAL
  Filled 2019-06-25 (×6): qty 2

## 2019-06-25 MED ORDER — POTASSIUM CHLORIDE CRYS ER 20 MEQ PO TBCR
40.0000 meq | EXTENDED_RELEASE_TABLET | ORAL | Status: AC
  Administered 2019-06-25 (×2): 40 meq via ORAL
  Filled 2019-06-25 (×2): qty 2

## 2019-06-25 NOTE — Plan of Care (Signed)
Progressing towards goals

## 2019-06-25 NOTE — Consult Note (Signed)
Renal Service Consult Note [facility]  William [name] 06/25/2019 William [name] Requesting Physician:  Dr Lonny Prude, R.   Reason for Consult:   HPI: The patient is a 33 y.o. year-old with hx of HTN presented on June 21 2019 to ED for chest pain, SOB, headaches milds.  BP's extremely high, has not been taking BP meds for 6 mos.  CXR was ok, EKG inferior ischemic changes no old ekg to compare. BP 237/ 171 in ED.  Given IV hydralazine in ED. Sent for CT to r/o dissection w/ ^^BP and chest pain. That was neg for dissection. Creat was 4.95.  Hydralazine IV didn't help, started on Cardene drip. Pt endorsed recent cocaine use.  ED wrote to avoid beta- blockers. Head CT neg for bleed, there were patchy low density areas. . Pt was admitted.   In Jan 2019 seen in ED for back pain, BP's up 192/122 , hadn't taken his BP meds for 2 years. Given lisinopril 10 qd on dc.   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  Severe PRES was primary condition considered, and could also explain the scattered infarcts. Infection or osmotic demyelination w/ bilat cva's were felt less likely.    Creat remains high at 5.2 this am.  Renal US shows wedge shaped hyperdensities (infarcts vs pyelo vs other) and echogenic kidneys w/o hydro, 10- 11 cm.  K+ is low. AG 13, CO2 25, BUN 25.  Alb 3.3.  UA shows >300 protein, 0-5 rbc's/ wbc's. UNa <10, UCr is 195.  No anemia of thrombocytopenia on the CBC.     Patient denies any hx of kidney disease, difficulty voiding, swelling , SOB, hematuria or dark urine.  No flank pain or abd pain, no n/v , no appetite issues.    ROS  denies CP  no  joint pain   no HA  no blurry vision  no rash  no diarrhea  no nausea/ vomiting  no dysuria  no difficulty voiding  no change in urine color    Past Medical History  Past Medical History:  Diagnosis Date  . Drug abuse, cocaine type ([place]) 06/23/2019  . Hyperlipidemia   . Hypertension    Past Surgical History History reviewed. No pertinent surgical history. Family History History reviewed. No pertinent family history. Social History  reports that he has been smoking. He has been smoking about 1.00 pack per day. He has never used smokeless tobacco. He reports current alcohol use. He reports current drug use. Drugs: Marijuana, Cocaine, and Benzodiazepines. Allergies  Allergies  Allergen Reactions  . Reglan [Metoclopramide]     Develops akathisia   Home medications Prior to Admission medications   Medication Sig Start Date End Date Taking? Authorizing Provider  acetaminophen (TYLENOL) 500 MG tablet Take 2 tablets (1,000 mg total) by mouth every 8 (eight) hours as needed. Patient not taking: Reported on 06/21/2019 12/17/17   Caccavale, Sophia, PA-C  cyclobenzaprine (FLEXERIL) 5 MG tablet Take 1 tablet (5 mg total) by mouth 2 (two) times daily as needed for muscle spasms. Patient not taking: Reported on 06/21/2019 12/17/17   Caccavale, Sophia, PA-C  ibuprofen (ADVIL,MOTRIN) 600 MG tablet Take 1 tablet (600 mg total) by mouth every 6 (  six) hours as needed. Patient not taking: Reported on 06/21/2019 09/12/14   Julianne Rice, MD  lisinopril (PRINIVIL,ZESTRIL) 10 MG tablet Take 1 tablet (10 mg total) by mouth daily. Patient not taking: Reported on 06/21/2019 12/17/17   Caccavale, Sophia, PA-C  traMADol (ULTRAM) 50 MG tablet Take 1 tablet (50 mg total) by mouth every 6 (six) hours as needed. Patient not taking: Reported on 06/21/2019 09/12/14   Julianne Rice, MD   Liver Function Tests Recent Labs  Lab 06/21/19 2354 06/23/19 0607 06/25/19 0756  AST 21 59*  --   ALT 15 18  --   ALKPHOS 81 67   --   BILITOT 1.1 1.1  --   PROT 7.2 6.3*  --   ALBUMIN 3.4* 3.2* 3.3*   No results for input(s): LIPASE, AMYLASE in the last 168 hours. CBC Recent Labs  Lab 06/23/19 0607 06/24/19 0502 06/25/19 0517  WBC 11.8* 10.3 9.8  HGB 12.0* 12.3* 12.0*  HCT 35.1* 36.1* 35.8*  MCV 92.1 93.3 92.5  PLT 228 237 188   Basic Metabolic Panel Recent Labs  Lab 06/21/19 2354 06/22/19 0744 06/22/19 1324 06/22/19 1438 06/23/19 0607 06/24/19 0502 06/25/19 0756  NA 133* 132* 134*  --  140 140 135  K 2.8* 2.5* 3.2*  --  3.1* 2.9* 2.7*  CL 93* 93* 97*  --  103 102 97*  CO2 27 23 24   --  25 23 25   GLUCOSE 140* 141* 112*  --  106* 93 133*  BUN 25* 27* 29*  --  26* 23* 25*  CREATININE 4.95* 5.29* 5.43*  --  5.36* 5.30* 5.21*  CALCIUM 9.1 9.1 9.1  --  9.1 8.8* 9.1  PHOS  --   --   --  3.5 2.8  --  3.5   Iron/TIBC/Ferritin/ %Sat No results found for: IRON, TIBC, FERRITIN, IRONPCTSAT  Vitals:   06/24/19 1914 06/24/19 2323 06/25/19 0315 06/25/19 0710  BP: (!) 174/110 (!) 211/131 (!) 179/118 (!) 204/140  Pulse: 90 95 95 92  Resp: 16 18 18 16   Temp: 98.5 F (36.9 C) 98 F (36.7 C) 98.3 F (36.8 C) 98.2 F (36.8 C)  TempSrc: Oral   Oral  SpO2: 100% 100% 100% 99%  Weight:   85.7 kg   Height:   5\' 9"  (1.753 m)     Exam Gen in no distress, young adult AAM No rash, cyanosis or gangrene Sclera anicteric, throat clear  No jvd or bruits, no LAN Chest clear bilat to bases RRR no MRG, quiet Abd soft ntnd no mass or ascites +bs, no bruits, no CVA tenderness GU normal male MS no joint effusions or deformity Ext no leg or UE edema, no wounds or ulcers Neuro is alert, Ox 3 , nf    Home meds:  - lisinopril 10 qd  - cyclobenzaprine 5 bid prn/ tramadol 50 qid prn  - ibuprofen 600 qid prn  - Renal US >> wedge shaped hyperdensities (infarcts vs pyelo vs other) and echogenic kidneys w/o hydro, 10- 11 cm.   - K+ 2.7- 3.1    AG 13  CO2 25    BUN 25   Alb 3.3   - UA >300 protein, 0-5 rbc's/  wbc's - UNa < 10, UCr = 195   - Hb and plts are wnl    Assessment/ Plan: 1. Renal failure: unclear acuity, multiple possible causes including renal-limited TMA from severe HTN, chronic renal failure d/t uncont HTN, acute renal infarcts, other underlying  CKD w/ proteinuria.  Low UNa suggestive of pre-renal picture that might be seen w/ severe HTN which can damage the small renal arteries and arterioles. Will get noncon renal CT to see if infarcts present and to what degree.  BP control main priority. Get urine PC ratio.  Making urine, and creat stable for 3 days which is good. No uremic signs, no indication for dialysis at this time.  2. HTN, severe: this goes back many years and pt has very poor medication compliance from chart review.  230/ 169 on admission.  BP's have improved here to around 180/ 110 average on diltiazem and prn IV hydralazine (sp Cardene gtt). Renal infarcts can cause acute HTN related to [facility] and may respond well to ACEi/ ARB but will hold off for now. Will change long-acting to short-acting po dilt 60 qid and will add po hydralazine, lower BP over next 48 hrs to about 160 / 100 or so. Check renin/ aldo w/ low K+.  Patient is pretty dismissive about his medical issues upon first glance.  3. Cocaine use : not sure what severity, pt is not very forthcoming    Kelly Splinter  MD 06/25/2019, 11:36 AM

## 2019-06-25 NOTE — Progress Notes (Signed)
PROGRESS NOTE    William [name]  ZOX:[id number] DOB: [birth year] DOA: 06/21/2019 PCP: Patient, No Pcp Per   Brief Narrative: William [name] is a 33 y.o. male with history of cocaine abuse, hyperlipidemia, hypertension.  Patient presented secondary to shortness of breath.  He was found to have significantly elevated blood pressure with evidence of kidney failure concerning for hypertensive emergency.  Patient was admitted to the ICU and placed on a Cardene drip.  Patient had associated mental status changes and imaging suggests multiple infarcts with possible evidence of PRES.  Neurology is on board and helping to manage.  Cardiology was consulted as well to help manage hypertension in addition to elevated troponin.   Assessment & Plan:   Active Problems:   Hypertensive emergency   Acute renal failure (HCC)   Cerebral thrombosis with cerebral infarction   Hypertensive emergency Patient was initially better controlled while on Cardene drip.  He was placed on Cardizem in addition to and hydralazine IV PRN.  Blood pressure over the last 24 hours has been significantly elevated.  Cardiology signed off.  Goal blood pressure is currently less than 220/120 per neurology recommendations -Increased Cardizem p.o. to 240 mg daily today. -Continue hydralazine IV -Nephrology help with antihypertensive regimen  Acute renal failure Patient with associated wedge-shaped areas of both kidneys concerning for bilateral renal infarcts versus bilateral pyelonephritis.  Etiology is ?secondary to malignant hypertension.  Patient with good urine output.  Creatinine still significantly elevated with minimal improvement. -Continue IV fluids -Will consult nephrology today  Acute CVA Bilateral anterior and posterior circulation infarcts.  Also evidence of edema of the brainstem concerning for PR ES versus osmotic demyelination.  Neurology was consulted.  Echo significant for now embolic source.  LDL of 158,  hemoglobin A1c of 4.9%.  Patient with a history of cocaine use. -Neurology recommendations: DAPT x3 weeks followed by aspirin alone -PT/OT recommending CIR: Consult placed  Demand ischemia Patient presented with increasingly worsening troponin.  Cardiology was consulted with initial plans for an eventual cardiac catheterization.  Etiology thought to be secondary to significant hypertension, severe LVH, cardiac.  Hyperlipidemia LDL of 158. -Continue Lipitor 40 mg daily  Cocaine abuse He will need to cease use on discharge.   DVT prophylaxis: SCDs Code Status:   Code Status: Full Code Family Communication: None Disposition Plan: Discharge to CIR once blood pressure better controlled   Consultants:   Cardiology  PCCM  Neurology/stroke team  Nephrology  Procedures:   06/22/2019: Echocardiogram IMPRESSIONS    1. The left ventricle has hyperdynamic systolic function, with an ejection fraction of >65%. The cavity size was decreased. There is severe concentric left ventricular hypertrophy. Left ventricular diastolic parameters were normal. No evidence of left  ventricular regional wall motion abnormalities.  2. The right ventricle has normal systolic function. The cavity was normal. There is no increase in right ventricular wall thickness. Right ventricular systolic pressure could not be assessed.  3. Small pericardial effusion.  4. No evidence of mitral valve stenosis.  5. The aortic valve is tricuspid. No stenosis of the aortic valve.  Antimicrobials:  None   Subjective: No issues today.  Objective: Vitals:   06/24/19 1914 06/24/19 2323 06/25/19 0315 06/25/19 0710  BP: (!) 174/110 (!) 211/131 (!) 179/118 (!) 204/140  Pulse: 90 95 95 92  Resp: 16 18 18 16   Temp: 98.5 F (36.9 C) 98 F (36.7 C) 98.3 F (36.8 C) 98.2 F (36.8 C)  TempSrc: Oral   Oral  SpO2: 100%  100% 100% 99%  Weight:   85.7 kg   Height:   5\' 9"  (1.753 m)     Intake/Output Summary (Last 24  hours) at 06/25/2019 1111 Last data filed at 06/25/2019 0800 Gross per 24 hour  Intake 120 ml  Output 2000 ml  Net -1880 ml   Filed Weights   06/23/19 1435 06/24/19 0236 06/25/19 0315  Weight: 82.2 kg 85.3 kg 85.7 kg    Examination:  General exam: Appears calm and comfortable Respiratory system: Clear to auscultation. Respiratory effort normal. Cardiovascular system: S1 & S2 heard, RRR. No murmurs, rubs, gallops or clicks. Gastrointestinal system: Abdomen is nondistended, soft and nontender. No organomegaly or masses felt. Normal bowel sounds heard. Central nervous system: Alert and oriented. No focal neurological deficits. 5/5 strength Extremities: No edema. No calf tenderness Skin: No cyanosis. No rashes Psychiatry: Judgement and insight appear normal. Mood & affect appropriate.     Data Reviewed: I have personally reviewed following labs and imaging studies  CBC: Recent Labs  Lab 06/21/19 2354 06/22/19 0744 06/23/19 0607 06/24/19 0502 06/25/19 0517  WBC 12.8* 16.4* 11.8* 10.3 9.8  HGB 14.9 14.8 12.0* 12.3* 12.0*  HCT 41.6 41.6 35.1* 36.1* 35.8*  MCV 88.7 87.6 92.1 93.3 92.5  PLT 210 236 228 237 606   Basic Metabolic Panel: Recent Labs  Lab 06/22/19 0744 06/22/19 1324 06/22/19 1438 06/23/19 0607 06/24/19 0502 06/25/19 0756  NA 132* 134*  --  140 140 135  K 2.5* 3.2*  --  3.1* 2.9* 2.7*  CL 93* 97*  --  103 102 97*  CO2 23 24  --  25 23 25   GLUCOSE 141* 112*  --  106* 93 133*  BUN 27* 29*  --  26* 23* 25*  CREATININE 5.29* 5.43*  --  5.36* 5.30* 5.21*  CALCIUM 9.1 9.1  --  9.1 8.8* 9.1  MG  --  2.3  --  2.2  --   --   PHOS  --   --  3.5 2.8  --  3.5   GFR: Estimated Creatinine Clearance: 21.7 mL/min (A) (by C-G formula based on SCr of 5.21 mg/dL (H)). Liver Function Tests: Recent Labs  Lab 06/21/19 2354 06/23/19 0607 06/25/19 0756  AST 21 59*  --   ALT 15 18  --   ALKPHOS 81 67  --   BILITOT 1.1 1.1  --   PROT 7.2 6.3*  --   ALBUMIN 3.4* 3.2*  3.3*   No results for input(s): LIPASE, AMYLASE in the last 168 hours. No results for input(s): AMMONIA in the last 168 hours. Coagulation Profile: No results for input(s): INR, PROTIME in the last 168 hours. Cardiac Enzymes: No results for input(s): CKTOTAL, CKMB, CKMBINDEX, TROPONINI in the last 168 hours. BNP (last 3 results) No results for input(s): PROBNP in the last 8760 hours. HbA1C: Recent Labs    06/23/19 0301  HGBA1C 4.9   CBG: Recent Labs  Lab 06/22/19 1739  GLUCAP 119*   Lipid Profile: Recent Labs    06/23/19 0301  CHOL 253*  HDL 67  LDLCALC 158*  TRIG 142  CHOLHDL 3.8   Thyroid Function Tests: No results for input(s): TSH, T4TOTAL, FREET4, T3FREE, THYROIDAB in the last 72 hours. Anemia Panel: No results for input(s): VITAMINB12, FOLATE, FERRITIN, TIBC, IRON, RETICCTPCT in the last 72 hours. Sepsis Labs: Recent Labs  Lab 06/22/19 1938 06/23/19 0607 06/24/19 0502  PROCALCITON 0.31 0.34 0.22    Recent Results (from the past  240 hour(s))  SARS Coronavirus 2 (CEPHEID - Performed in [facility] hospital lab), Hosp Order     Status: None   Collection Time: 06/22/19 12:20 AM   Specimen: Nasopharyngeal Swab  Result Value Ref Range Status   SARS Coronavirus 2 NEGATIVE NEGATIVE Final    Comment: (NOTE) If result is NEGATIVE SARS-CoV-2 target nucleic acids are NOT DETECTED. The SARS-CoV-2 RNA is generally detectable in upper and lower  respiratory specimens during the acute phase of infection. The lowest  concentration of SARS-CoV-2 viral copies this assay can detect is 250  copies / mL. A negative result does not preclude SARS-CoV-2 infection  and should not be used as the sole basis for treatment or other  patient management decisions.  A negative result may occur with  improper specimen collection / handling, submission of specimen other  than nasopharyngeal swab, presence of viral mutation(s) within the  areas targeted by this assay, and inadequate  number of viral copies  (<250 copies / mL). A negative result must be combined with clinical  observations, patient history, and epidemiological information. If result is POSITIVE SARS-CoV-2 target nucleic acids are DETECTED. The SARS-CoV-2 RNA is generally detectable in upper and lower  respiratory specimens dur ing the acute phase of infection.  Positive  results are indicative of active infection with SARS-CoV-2.  Clinical  correlation with patient history and other diagnostic information is  necessary to determine patient infection status.  Positive results do  not rule out bacterial infection or co-infection with other viruses. If result is PRESUMPTIVE POSTIVE SARS-CoV-2 nucleic acids MAY BE PRESENT.   A presumptive positive result was obtained on the submitted specimen  and confirmed on repeat testing.  While 2019 novel coronavirus  (SARS-CoV-2) nucleic acids may be present in the submitted sample  additional confirmatory testing may be necessary for epidemiological  and / or clinical management purposes  to differentiate between  SARS-CoV-2 and other Sarbecovirus currently known to infect humans.  If clinically indicated additional testing with an alternate test  methodology [phone]) is advised. The SARS-CoV-2 RNA is generally  detectable in upper and lower respiratory sp ecimens during the acute  phase of infection. The expected result is Negative. Fact Sheet for Patients:  StrictlyIdeas.no Fact Sheet for Healthcare Providers: BankingDealers.co.za This test is not yet approved or cleared by the [place] FDA and has been authorized for detection and/or diagnosis of SARS-CoV-2 by FDA under an Emergency Use Authorization (EUA).  This EUA will remain in effect (meaning this test can be used) for the duration of the COVID-19 declaration under Section 564(b)(1) of the Act, 21 U.S.C. section 360bbb-3(b)(1), unless the  authorization is terminated or revoked sooner. Performed at [facility], [place] [address] [place], [place] [address]   MRSA PCR Screening     Status: None   Collection Time: 06/22/19  5:28 AM   Specimen: Nasal Mucosa; Nasopharyngeal  Result Value Ref Range Status   MRSA by PCR NEGATIVE NEGATIVE Final    Comment:        The GeneXpert MRSA Assay (FDA approved for NASAL specimens only), is one component of a comprehensive MRSA colonization surveillance program. It is not intended to diagnose MRSA infection nor to guide or monitor treatment for MRSA infections. Performed at [facility], [place] [address], [place], [place] [address]   Culture, blood (Routine X 2) w Reflex to ID Panel     Status: None (Preliminary result)   Collection Time: 06/22/19  7:38  PM   Specimen: BLOOD  Result Value Ref Range Status   Specimen Description BLOOD LEFT ANTECUBITAL  Final   Special Requests   Final    BOTTLES DRAWN AEROBIC ONLY Blood Culture adequate volume   Culture   Final    NO GROWTH 2 DAYS Performed at [facility], [address], [place], [place] [address]    Report Status PENDING  Incomplete  Culture, blood (Routine X 2) w Reflex to ID Panel     Status: None (Preliminary result)   Collection Time: 06/22/19  7:38 PM   Specimen: BLOOD RIGHT HAND  Result Value Ref Range Status   Specimen Description BLOOD RIGHT HAND  Final   Special Requests   Final    BOTTLES DRAWN AEROBIC ONLY Blood Culture adequate volume   Culture   Final    NO GROWTH 2 DAYS Performed at [facility], [place] [address], [place],  [address]    Report Status PENDING  Incomplete         Radiology Studies: Mr Angio Head Wo Contrast  Result Date: 06/23/2019 CLINICAL DATA:  33 year old male with malignant hypertension and widespread abnormal brain signal on noncontrast head CT 06/22/2019. differential considerations including posterior reversible encephalopathy syndrome (PRES), osmotic  demyelination, ischemic or embolic infarcts. EXAM: MRA NECK WITHOUT CONTRAST MRA HEAD WITHOUT CONTRAST TECHNIQUE: Angiographic images of the neck were obtained using MRA technique without intravenous contast. Angiographic images of the Circle of Willis were obtained using MRA technique without intravenous contrast. COMPARISON:  Brain MRI 06/22/2019. FINDINGS: MRA NECK FINDINGS Time-of-flight neck MRA images reveal antegrade flow in both cervical carotid and vertebral arteries throughout the neck and to the skull base. The left vertebral might arise directly from the arch (series 4, image 184) and is mildly dominant in the neck. The carotid bifurcations appear normal. No carotid or vertebral artery stenosis identified in the neck. MRA HEAD FINDINGS No intracranial mass effect or ventriculomegaly. Antegrade flow in the posterior circulation with dominant left vertebral artery. The right vertebral is patent to the vertebrobasilar junction, but is diminutive beyond the PICA. Both PICA origins are patent. Patent basilar artery without stenosis. Normal SCA origins. Fetal type PCA origins, more so the right. Normal posterior communicating arteries. Bilateral PCA branches appear normal. Antegrade flow in both ICA siphons. No siphon stenosis. Normal ophthalmic and posterior communicating artery origins. Patent carotid termini. Normal MCA and ACA origins. The left A1 is dominant. Anterior communicating artery appears fenestrated (normal variant). Visible bilateral ACA branches appear normal. Left MCA M1 segment, bifurcation and visible left MCA branches are within normal limits. Right MCA M1, bifurcation, and visible right MCA branches appear normal. IMPRESSION: Negative MRA of the head and neck. Electronically Signed   By: Genevie Ann M.D.   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 differential considerations including posterior reversible encephalopathy syndrome (PRES), osmotic demyelination, ischemic or embolic infarcts. EXAM: MRA NECK WITHOUT CONTRAST MRA HEAD WITHOUT CONTRAST TECHNIQUE: Angiographic images of the neck were obtained using MRA technique without intravenous contast. Angiographic images of the Circle of Willis were obtained using MRA technique without intravenous contrast. COMPARISON:  Brain MRI 06/22/2019. FINDINGS: MRA NECK FINDINGS Time-of-flight neck MRA images reveal antegrade flow in both cervical carotid and vertebral arteries throughout the neck and to the skull base. The left vertebral might arise  directly from the arch (series 4, image 184) and is mildly dominant in the neck. The carotid bifurcations appear normal. No carotid or vertebral artery stenosis identified in the neck. MRA HEAD FINDINGS No intracranial mass effect or ventriculomegaly. Antegrade flow in the posterior circulation with dominant left vertebral artery. The right vertebral is patent to the vertebrobasilar junction, but is diminutive beyond the PICA. Both PICA origins are patent. Patent basilar artery without stenosis. Normal SCA origins. Fetal type PCA origins, more so the right. Normal posterior communicating arteries. Bilateral PCA branches appear normal. Antegrade flow in both ICA siphons. No siphon stenosis. Normal ophthalmic and posterior communicating artery origins. Patent carotid termini. Normal MCA and ACA origins. The left A1 is dominant. Anterior communicating artery appears fenestrated (normal variant). Visible bilateral ACA branches appear normal. Left MCA M1 segment, bifurcation and visible left MCA branches are within normal limits. Right MCA M1, bifurcation, and visible right MCA branches appear normal. IMPRESSION: Negative MRA of the head and neck. Electronically Signed   By: Genevie Ann M.D.   On: 06/23/2019 18:22        Scheduled Meds:  aspirin  81 mg  Oral Daily   atorvastatin  40 mg Oral q1800   Chlorhexidine Gluconate Cloth  6 each Topical Daily   diltiazem  240 mg Oral Daily   potassium chloride  40 mEq Oral Q4H   Continuous Infusions:  lactated ringers 125 mL/hr at 06/23/19 0200     LOS: 3 days     Cordelia Poche, MD [facility] 06/25/2019, 11:11 AM  If 7PM-7AM, please contact night-coverage [email or web address]

## 2019-06-26 ENCOUNTER — Encounter (HOSPITAL_COMMUNITY): Payer: Self-pay | Admitting: Radiology

## 2019-06-26 LAB — RENAL FUNCTION PANEL
Albumin: 3.4 g/dL — ABNORMAL LOW (ref 3.5–5.0)
Anion gap: 12 (ref 5–15)
BUN: 23 mg/dL — ABNORMAL HIGH (ref 6–20)
CO2: 27 mmol/L (ref 22–32)
Calcium: 9.3 mg/dL (ref 8.9–10.3)
Chloride: 100 mmol/L (ref 98–111)
Creatinine, Ser: 5.37 mg/dL — ABNORMAL HIGH (ref 0.61–1.24)
GFR calc Af Amer: 15 mL/min — ABNORMAL LOW (ref >60)
GFR calc non Af Amer: 13 mL/min — ABNORMAL LOW (ref >60)
Glucose, Bld: 118 mg/dL — ABNORMAL HIGH (ref 70–99)
Phosphorus: 3 mg/dL (ref 2.5–4.6)
Potassium: 3 mmol/L — ABNORMAL LOW (ref 3.5–5.1)
Sodium: 139 mmol/L (ref 135–145)

## 2019-06-26 LAB — CBC
HCT: 37.7 % — ABNORMAL LOW (ref 39.0–52.0)
Hemoglobin: 13.1 g/dL (ref 13.0–17.0)
MCH: 31.6 pg (ref 26.0–34.0)
MCHC: 34.7 g/dL (ref 30.0–36.0)
MCV: 90.8 fL (ref 80.0–100.0)
Platelets: 299 10*3/uL (ref 150–400)
RBC: 4.15 MIL/uL — ABNORMAL LOW (ref 4.22–5.81)
RDW: 12 % (ref 11.5–15.5)
WBC: 10 10*3/uL (ref 4.0–10.5)
nRBC: 0 % (ref 0.0–0.2)

## 2019-06-26 MED ORDER — HYDRALAZINE HCL 50 MG PO TABS
50.0000 mg | ORAL_TABLET | Freq: Three times a day (TID) | ORAL | Status: DC
  Administered 2019-06-26 – 2019-06-29 (×10): 50 mg via ORAL
  Filled 2019-06-26 (×10): qty 1

## 2019-06-26 MED ORDER — NICOTINE 14 MG/24HR TD PT24
14.0000 mg | MEDICATED_PATCH | Freq: Every day | TRANSDERMAL | Status: DC
  Administered 2019-06-26 – 2019-06-29 (×4): 14 mg via TRANSDERMAL
  Filled 2019-06-26 (×4): qty 1

## 2019-06-26 NOTE — Progress Notes (Signed)
[facility] Progress Note   Subjective:   No complaints today.  No nausea, headache, CP, dyspnea, orthopnea, edema.    Objective Vitals:   06/25/19 1206 06/25/19 2024 06/25/19 2025 06/26/19 0624  BP: (!) 180/116 (!) 182/131 (!) 182/131 (!) 175/120  Pulse: 86 84 84 82  Resp: '18 16 16 16  ' Temp: 98 F (36.7 C)  98.1 F (36.7 C) 98 F (36.7 C)  TempSrc: Oral  Oral Oral  SpO2:  100% 100% 99%  Weight:      Height:       Physical Exam General: well appearing lying in bed Heart: RRR Lungs: clear to bases Abdomen: soft, nontender, no bruits Extremities: no edema  Additional Objective Labs: Basic Metabolic Panel: Recent Labs  Lab 06/23/19 0607 06/24/19 0502 06/25/19 0756 06/26/19 0853  NA 140 140 135 139  K 3.1* 2.9* 2.7* 3.0*  CL 103 102 97* 100  CO2 '25 23 25 27  ' GLUCOSE 106* 93 133* 118*  BUN 26* 23* 25* 23*  CREATININE 5.36* 5.30* 5.21* 5.37*  CALCIUM 9.1 8.8* 9.1 9.3  PHOS 2.8  --  3.5 3.0   Liver Function Tests: Recent Labs  Lab 06/21/19 2354 06/23/19 0607 06/25/19 0756 06/26/19 0853  AST 21 59*  --   --   ALT 15 18  --   --   ALKPHOS 81 67  --   --   BILITOT 1.1 1.1  --   --   PROT 7.2 6.3*  --   --   ALBUMIN 3.4* 3.2* 3.3* 3.4*   No results for input(s): LIPASE, AMYLASE in the last 168 hours. CBC: Recent Labs  Lab 06/22/19 0744 06/23/19 0607 06/24/19 0502 06/25/19 0517 06/26/19 0853  WBC 16.4* 11.8* 10.3 9.8 10.0  HGB 14.8 12.0* 12.3* 12.0* 13.1  HCT 41.6 35.1* 36.1* 35.8* 37.7*  MCV 87.6 92.1 93.3 92.5 90.8  PLT 236 228 237 244 299   Blood Culture    Component Value Date/Time   SDES BLOOD LEFT ANTECUBITAL 06/22/2019 1938   SDES BLOOD RIGHT HAND 06/22/2019 1938   SPECREQUEST  06/22/2019 1938    BOTTLES DRAWN AEROBIC ONLY Blood Culture adequate volume   SPECREQUEST  06/22/2019 1938    BOTTLES DRAWN AEROBIC ONLY Blood Culture adequate volume   CULT  06/22/2019 1938    NO GROWTH 3 DAYS Performed at [facility], [place] [address], [place], [place] [address]    CULT  06/22/2019 1938    NO GROWTH 3 DAYS Performed at [facility], [place] [address] [place], [place] [address]    REPTSTATUS PENDING 06/22/2019 1938   REPTSTATUS PENDING 06/22/2019 1938    Cardiac Enzymes: No results for input(s): CKTOTAL, CKMB, CKMBINDEX, TROPONINI in the last 168 hours. CBG: Recent Labs  Lab 06/22/19 1739  GLUCAP 119*   Iron Studies: No results for input(s): IRON, TIBC, TRANSFERRIN, FERRITIN in the last 72 hours. '@[email or web address]' @ Studies/Results: Ct Abdomen Wo Contrast  Result Date: 06/25/2019 CLINICAL DATA:  Renal failure. Renal infarcts suggested by ultrasound. EXAM: CT ABDOMEN WITHOUT CONTRAST TECHNIQUE: Multidetector CT imaging of the abdomen was performed following the standard protocol without IV contrast. COMPARISON:  Renal ultrasound June 23, 2019 FINDINGS: Lower chest: No acute abnormality. Hepatobiliary: No focal liver abnormality is seen. Question gallbladder wall thickening with small amount of pericholecystic fluid. Pancreas: Unremarkable. No pancreatic ductal dilatation or surrounding inflammatory changes. Spleen: Normal in size without focal abnormality. Adrenals/Urinary Tract: Adrenal glands are unremarkable. Kidneys are  without renal calculi, focal lesion, or hydronephrosis. Minimal bilateral perirenal fat stranding. Stomach/Bowel: Stomach is within normal limits. Appendix appears normal. No evidence of bowel wall thickening, distention, or inflammatory changes. Vascular/Lymphatic: No significant vascular findings are present. No enlarged abdominal or pelvic lymph nodes. Other: No abdominal wall hernia or abnormality. Musculoskeletal: No acute or significant osseous findings. IMPRESSION: 1. Question gallbladder wall thickening with small amount of pericholecystic fluid. Further evaluation with right upper quadrant ultrasound may be considered. 2. Minimal bilateral perirenal fat stranding, nonspecific.  Electronically Signed   By: Fidela Salisbury M.D.   On: 06/25/2019 19:48   Medications:  . aspirin  81 mg Oral Daily  . atorvastatin  40 mg Oral q1800  . Chlorhexidine Gluconate Cloth  6 each Topical Daily  . diltiazem  60 mg Oral Q6H  . hydrALAZINE  25 mg Oral Q8H    Assessment/Plan: ** AKI:  Cr 1.12 on 12/2017 > 06/21/19 presentation 4.95 and now hovering in the mid 5s, today 5.37, eGFR 13.  Nonoliguric with 1.4L UOP yesterday.   06/23/19 renal US - 10.9 and 10.2cm, echogenic; wedge shaped hyperechoic areas BL = ddx infarcts vs pyelonephritis; No renal infarcts noted on CT a/p from 06/25/19 (noncontrast).  7/9 UA  +++ proteinuria, + glucose, mod blood, neg LE and nit. Microscopy with 0-5 RBC/hpf, no WBC.  UNa 7/9 <10.  UP/C pending.    Suspect AKI due to hypertensive urgency/emergency.  I do not see an indication for renal biopsy.  AKI due to hypertension can be slow to improve and many times irreversible.  No indications for RRT at this time, will follow closely.    **  HTN emergency:  Dx with HTN several years ago, no consistent treatment.  MRI 7/9 with BL infarcts (acute in both hemispheres and brainstem), chronic small vessel changes, consider PRES. Adrenal glands normal on 06/25/19 CT.  BP in past 24hrs improved to 170/120s.  Currently on hydralazine 25 TID and diltiazem 60 q6hrs.  Increase hydralazine to 50 TID, 1st dose now.  Goal for short term - next 1-2 days is 160/100.  No RAAS inhibitors now in setting of such low GFR and lack of consistent f/u in the past.   Renin, aldo levels pending.  Will check renal artery dopplers for completeness.   **Cocaine use: counseled for impact on health esp in setting of HTN.   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  TTE without vegetation.  Per cardiology note, they d/w neuro and not thought to be embolic.  Plan for CIR post acute hospitalization.    Jannifer Hick MD 06/26/2019, 10:37 AM  [facility] Pager: [phone]

## 2019-06-26 NOTE — Progress Notes (Signed)
Inpatient Rehabilitation Admissions Coordinator  Inpatient rehab consult received. I met with patient at bedside with sitter present. Patient lacks awareness of his deficits. I did contact his wife, Daphane Shepherd, by phone. I discussed goals and expectations of an inpt rehab admit, but await further progress with therapy before pursuing an inpt rehab admit. It will depend on his progress once his BP more controlled. Pt is uninsured since laid off 02/2019 due to [place]. I will follow up tomorrow. Wife can provide 24/7 supervision at d/c.  Danne Baxter, RN, MSN Rehab Admissions Coordinator [phone] 06/26/2019 4:16 PM

## 2019-06-26 NOTE — Progress Notes (Signed)
PROGRESS NOTE    William [name]  MLY:[id number] DOB: Oct 01, 1986 DOA: 06/21/2019 PCP: Patient, No Pcp Per   Brief Narrative: William [name] is a 33 y.o. male with history of cocaine abuse, hyperlipidemia, hypertension.  Patient presented secondary to shortness of breath.  He was found to have significantly elevated blood pressure with evidence of kidney failure concerning for hypertensive emergency.  Patient was admitted to the ICU and placed on a Cardene drip.  Patient had associated mental status changes and imaging suggests multiple infarcts with possible evidence of PRES.  Neurology is on board and helping to manage.  Cardiology was consulted as well to help manage hypertension in addition to elevated troponin.   Assessment & Plan:   Active Problems:   Hypertensive emergency   Acute renal failure (HCC)   Cerebral thrombosis with cerebral infarction   Hypertensive emergency Patient was initially better controlled while on Cardene drip.  He was placed on Cardizem in addition to and hydralazine IV PRN.  Blood pressure over the last 24 hours has been significantly elevated.  Cardiology signed off.  Goal blood pressure is currently less than 220/120 per neurology recommendations -Continue Cardizem p.o. to 60 mg QID -Started on hydralazine 50 mg TID -Continue hydralazine IV -Continued Nephrology help with antihypertensive regimen as this is tied into kidneys  Acute renal failure Patient with associated wedge-shaped areas of both kidneys concerning for bilateral renal infarcts versus bilateral pyelonephritis.  Etiology is ?secondary to malignant hypertension. CT abdomen/pelvis not significant for renal infarcts. Patient with good urine output.  Creatinine still significantly elevated with minimal improvement. Slightly up today. -Nephrology recommendations pending today  Acute CVA Bilateral anterior and posterior circulation infarcts.  Also evidence of edema of the brainstem concerning  for PR ES versus osmotic demyelination.  Neurology was consulted.  Echo significant for now embolic source.  LDL of 158, hemoglobin A1c of 4.9%.  Patient with a history of cocaine use. -Neurology recommendations: DAPT x3 weeks followed by aspirin alone -PT/OT recommending CIR: Consult placed  Demand ischemia Patient presented with increasingly worsening troponin.  Cardiology was consulted with initial plans for an eventual cardiac catheterization.  Etiology thought to be secondary to significant hypertension, severe LVH, cardiac.  Hyperlipidemia LDL of 158. -Continue Lipitor 40 mg daily  Cocaine abuse He will need to cease use on discharge.   DVT prophylaxis: SCDs Code Status:   Code Status: Full Code Family Communication: None Disposition Plan: Discharge to CIR once blood pressure better controlled   Consultants:   Cardiology  PCCM  Neurology/stroke team  Nephrology  Procedures:   06/22/2019: Echocardiogram IMPRESSIONS    1. The left ventricle has hyperdynamic systolic function, with an ejection fraction of >65%. The cavity size was decreased. There is severe concentric left ventricular hypertrophy. Left ventricular diastolic parameters were normal. No evidence of left  ventricular regional wall motion abnormalities.  2. The right ventricle has normal systolic function. The cavity was normal. There is no increase in right ventricular wall thickness. Right ventricular systolic pressure could not be assessed.  3. Small pericardial effusion.  4. No evidence of mitral valve stenosis.  5. The aortic valve is tricuspid. No stenosis of the aortic valve.  Antimicrobials:  None   Subjective: No concerns.  Objective: Vitals:   06/25/19 1206 06/25/19 2024 06/25/19 2025 06/26/19 0624  BP: (!) 180/116 (!) 182/131 (!) 182/131 (!) 175/120  Pulse: 86 84 84 82  Resp: 18 16 16 16   Temp: 98 F (36.7 C)  98.1 F (36.7  C) 98 F (36.7 C)  TempSrc: Oral  Oral Oral  SpO2:   100% 100% 99%  Weight:      Height:        Intake/Output Summary (Last 24 hours) at 06/26/2019 1056 Last data filed at 06/26/2019 0631 Gross per 24 hour  Intake -  Output 1100 ml  Net -1100 ml   Filed Weights   06/23/19 1435 06/24/19 0236 06/25/19 0315  Weight: 82.2 kg 85.3 kg 85.7 kg    Examination:  General exam: Appears calm and comfortable Respiratory system: Clear to auscultation. Respiratory effort normal. Cardiovascular system: S1 & S2 heard, RRR. 2/6 systolic murmur. Gastrointestinal system: Abdomen is nondistended, soft and nontender. No organomegaly or masses felt. Normal bowel sounds heard. Central nervous system: Alert. Recent memory appears to be slightly impaired. No focal neurological deficits. Extremities: No edema. No calf tenderness Skin: No cyanosis. No rashes Psychiatry: Judgement and insight appear normal. Mood & affect appropriate. .     Data Reviewed: I have personally reviewed following labs and imaging studies  CBC: Recent Labs  Lab 06/22/19 0744 06/23/19 0607 06/24/19 0502 06/25/19 0517 06/26/19 0853  WBC 16.4* 11.8* 10.3 9.8 10.0  HGB 14.8 12.0* 12.3* 12.0* 13.1  HCT 41.6 35.1* 36.1* 35.8* 37.7*  MCV 87.6 92.1 93.3 92.5 90.8  PLT 236 228 237 244 638   Basic Metabolic Panel: Recent Labs  Lab 06/22/19 1324 06/22/19 1438 06/23/19 0607 06/24/19 0502 06/25/19 0756 06/26/19 0853  NA 134*  --  140 140 135 139  K 3.2*  --  3.1* 2.9* 2.7* 3.0*  CL 97*  --  103 102 97* 100  CO2 24  --  25 23 25 27   GLUCOSE 112*  --  106* 93 133* 118*  BUN 29*  --  26* 23* 25* 23*  CREATININE 5.43*  --  5.36* 5.30* 5.21* 5.37*  CALCIUM 9.1  --  9.1 8.8* 9.1 9.3  MG 2.3  --  2.2  --   --   --   PHOS  --  3.5 2.8  --  3.5 3.0   GFR: Estimated Creatinine Clearance: 21 mL/min (A) (by C-G formula based on SCr of 5.37 mg/dL (H)). Liver Function Tests: Recent Labs  Lab 06/21/19 2354 06/23/19 0607 06/25/19 0756 06/26/19 0853  AST 21 59*  --   --   ALT  15 18  --   --   ALKPHOS 81 67  --   --   BILITOT 1.1 1.1  --   --   PROT 7.2 6.3*  --   --   ALBUMIN 3.4* 3.2* 3.3* 3.4*   No results for input(s): LIPASE, AMYLASE in the last 168 hours. No results for input(s): AMMONIA in the last 168 hours. Coagulation Profile: No results for input(s): INR, PROTIME in the last 168 hours. Cardiac Enzymes: No results for input(s): CKTOTAL, CKMB, CKMBINDEX, TROPONINI in the last 168 hours. BNP (last 3 results) No results for input(s): PROBNP in the last 8760 hours. HbA1C: No results for input(s): HGBA1C in the last 72 hours. CBG: Recent Labs  Lab 06/22/19 1739  GLUCAP 119*   Lipid Profile: No results for input(s): CHOL, HDL, LDLCALC, TRIG, CHOLHDL, LDLDIRECT in the last 72 hours. Thyroid Function Tests: No results for input(s): TSH, T4TOTAL, FREET4, T3FREE, THYROIDAB in the last 72 hours. Anemia Panel: No results for input(s): VITAMINB12, FOLATE, FERRITIN, TIBC, IRON, RETICCTPCT in the last 72 hours. Sepsis Labs: Recent Labs  Lab 06/22/19 1938 06/23/19 7564  06/24/19 0502  PROCALCITON 0.31 0.34 0.22    Recent Results (from the past 240 hour(s))  SARS Coronavirus 2 (CEPHEID - Performed in [place] hospital lab), Hosp Order     Status: None   Collection Time: 06/22/19 12:20 AM   Specimen: Nasopharyngeal Swab  Result Value Ref Range Status   SARS Coronavirus 2 NEGATIVE NEGATIVE Final    Comment: (NOTE) If result is NEGATIVE SARS-CoV-2 target nucleic acids are NOT DETECTED. The SARS-CoV-2 RNA is generally detectable in upper and lower  respiratory specimens during the acute phase of infection. The lowest  concentration of SARS-CoV-2 viral copies this assay can detect is 250  copies / mL. A negative result does not preclude SARS-CoV-2 infection  and should not be used as the sole basis for treatment or other  patient management decisions.  A negative result may occur with  improper specimen collection / handling, submission of  specimen other  than nasopharyngeal swab, presence of viral mutation(s) within the  areas targeted by this assay, and inadequate number of viral copies  (<250 copies / mL). A negative result must be combined with clinical  observations, patient history, and epidemiological information. If result is POSITIVE SARS-CoV-2 target nucleic acids are DETECTED. The SARS-CoV-2 RNA is generally detectable in upper and lower  respiratory specimens dur ing the acute phase of infection.  Positive  results are indicative of active infection with SARS-CoV-2.  Clinical  correlation with patient history and other diagnostic information is  necessary to determine patient infection status.  Positive results do  not rule out bacterial infection or co-infection with other viruses. If result is PRESUMPTIVE POSTIVE SARS-CoV-2 nucleic acids MAY BE PRESENT.   A presumptive positive result was obtained on the submitted specimen  and confirmed on repeat testing.  While 2019 novel coronavirus  (SARS-CoV-2) nucleic acids may be present in the submitted sample  additional confirmatory testing may be necessary for epidemiological  and / or clinical management purposes  to differentiate between  SARS-CoV-2 and other Sarbecovirus currently known to infect humans.  If clinically indicated additional testing with an alternate test  methodology [id number]) is advised. The SARS-CoV-2 RNA is generally  detectable in upper and lower respiratory sp ecimens during the acute  phase of infection. The expected result is Negative. Fact Sheet for Patients:  StrictlyIdeas.no Fact Sheet for Healthcare Providers: BankingDealers.co.za This test is not yet approved or cleared by the [place] FDA and has been authorized for detection and/or diagnosis of SARS-CoV-2 by FDA under an Emergency Use Authorization (EUA).  This EUA will remain in effect (meaning this test can be used) for the  duration of the COVID-19 declaration under Section 564(b)(1) of the Act, 21 U.S.C. section 360bbb-3(b)(1), unless the authorization is terminated or revoked sooner. Performed at [facility], [place] [address] [place], [place] [address]   MRSA PCR Screening     Status: None   Collection Time: 06/22/19  5:28 AM   Specimen: Nasal Mucosa; Nasopharyngeal  Result Value Ref Range Status   MRSA by PCR NEGATIVE NEGATIVE Final    Comment:        The GeneXpert MRSA Assay (FDA approved for NASAL specimens only), is one component of a comprehensive MRSA colonization surveillance program. It is not intended to diagnose MRSA infection nor to guide or monitor treatment for MRSA infections. Performed at [facility], [place] [address], [place],  [address]   Culture, blood (Routine X 2) w Reflex to ID Panel  Status: None (Preliminary result)   Collection Time: 06/22/19  7:38 PM   Specimen: BLOOD  Result Value Ref Range Status   Specimen Description BLOOD LEFT ANTECUBITAL  Final   Special Requests   Final    BOTTLES DRAWN AEROBIC ONLY Blood Culture adequate volume   Culture   Final    NO GROWTH 3 DAYS Performed at [facility], [address] [place], [place] [address]    Report Status PENDING  Incomplete  Culture, blood (Routine X 2) w Reflex to ID Panel     Status: None (Preliminary result)   Collection Time: 06/22/19  7:38 PM   Specimen: BLOOD RIGHT HAND  Result Value Ref Range Status   Specimen Description BLOOD RIGHT HAND  Final   Special Requests   Final    BOTTLES DRAWN AEROBIC ONLY Blood Culture adequate volume   Culture   Final    NO GROWTH 3 DAYS Performed at [facility], [place] [address], [place], [place] [address]    Report Status PENDING  Incomplete         Radiology Studies: Ct Abdomen Wo Contrast  Result Date: 06/25/2019 CLINICAL DATA:  Renal failure. Renal infarcts suggested by ultrasound. EXAM: CT ABDOMEN WITHOUT CONTRAST TECHNIQUE:  Multidetector CT imaging of the abdomen was performed following the standard protocol without IV contrast. COMPARISON:  Renal ultrasound June 23, 2019 FINDINGS: Lower chest: No acute abnormality. Hepatobiliary: No focal liver abnormality is seen. Question gallbladder wall thickening with small amount of pericholecystic fluid. Pancreas: Unremarkable. No pancreatic ductal dilatation or surrounding inflammatory changes. Spleen: Normal in size without focal abnormality. Adrenals/Urinary Tract: Adrenal glands are unremarkable. Kidneys are without renal calculi, focal lesion, or hydronephrosis. Minimal bilateral perirenal fat stranding. Stomach/Bowel: Stomach is within normal limits. Appendix appears normal. No evidence of bowel wall thickening, distention, or inflammatory changes. Vascular/Lymphatic: No significant vascular findings are present. No enlarged abdominal or pelvic lymph nodes. Other: No abdominal wall hernia or abnormality. Musculoskeletal: No acute or significant osseous findings. IMPRESSION: 1. Question gallbladder wall thickening with small amount of pericholecystic fluid. Further evaluation with right upper quadrant ultrasound may be considered. 2. Minimal bilateral perirenal fat stranding, nonspecific. Electronically Signed   By: Fidela Salisbury M.D.   On: 06/25/2019 19:48        Scheduled Meds: . aspirin  81 mg Oral Daily  . atorvastatin  40 mg Oral q1800  . Chlorhexidine Gluconate Cloth  6 each Topical Daily  . diltiazem  60 mg Oral Q6H  . hydrALAZINE  50 mg Oral Q8H   Continuous Infusions:    LOS: 4 days     Cordelia Poche, MD [facility] 06/26/2019, 10:56 AM  If 7PM-7AM, please contact night-coverage [email or web address]

## 2019-06-26 NOTE — Progress Notes (Signed)
Occupational Therapy Treatment Patient Details Name: William [name] MRN: [id number] DOB: 1986/04/05 Today's Date: 06/26/2019    History of present illness 33 year old male with a history of hypertension who initially presented with chest tightness, shortness of breath, and intermittent headaches.  Wife also noted decreased energy, sluggish appearing, and constipation. BP in ED 254/167. Admitted to ICU 06/22/19.  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 Pt with elevated troponin assessed by cardiology to be likely due to demand ischemia.    OT comments  Engaged in therapeutic activity with focus on Rt attention, awareness, and functional use of RUE.  Utilized PVC pipe tree with focus on replication of 2D pattern while addressing Rt attention, functional use of RUE.  Pt demonstrating decreased awareness and sequencing of task as well as impaired problem solving to correct errors.  Pt required mod-max cues for completion of task due to decreased awareness of errors and how to correct them.  Pt demonstrated good use of RUE and scanning to Rt spontaneously to complete task.  Educated on carryover of task, specifically implications of errors, to home making tasks as well as job requirements.  Pt remained in chair position in bed with sitter present.  Follow Up Recommendations  CIR;Supervision/Assistance - 24 hour    Equipment Recommendations  None recommended by OT    Recommendations for Other Services Rehab consult    Precautions / Restrictions Precautions Precautions: Fall;Other (comment)(monitor BP) Precaution Comments: monitor BP              ADL either performed or assessed with clinical judgement        Vision Baseline Vision/History: Wears glasses Patient Visual Report: No change from baseline            Cognition  Arousal/Alertness: Awake/alert Behavior During Therapy: Impulsive Overall Cognitive Status: Impaired/Different from baseline Area of Impairment: Attention;Following commands;Safety/judgement;Awareness;Problem solving                   Current Attention Level: Selective Memory: Decreased short-term memory Following Commands: Follows one step commands inconsistently;Follows one step commands with increased time Safety/Judgement: Decreased awareness of safety;Decreased awareness of deficits Awareness: Intellectual Problem Solving: Slow processing;Requires verbal cues                     Pertinent Vitals/ Pain       Pain Assessment: No/denies pain     Prior Functioning/Environment              Frequency  Min 2X/week        Progress Toward Goals  OT Goals(current goals can now be found in the care plan section)  Progress towards OT goals: Progressing toward goals  Acute Rehab OT Goals Patient Stated Goal: to get home OT Goal Formulation: With patient Time For Goal Achievement: 07/07/19 Potential to Achieve Goals: Good  Plan Discharge plan remains appropriate       AM-PAC OT "6 Clicks" Daily Activity     Outcome Measure   Help from another person eating meals?: A Little Help from another person taking care of personal grooming?: A Lot Help from another person toileting, which includes using toliet, bedpan, or urinal?: A Lot Help from another person bathing (including washing, rinsing, drying)?: A Lot Help from another person to put on and taking off regular upper body clothing?: A Lot Help from another person to put on  and taking off regular lower body clothing?: A Lot 6 Click Score: 13    End of Session    OT Visit Diagnosis: Unsteadiness on feet (R26.81);Cognitive communication deficit (R41.841);Dizziness and giddiness (R42) Symptoms and signs involving cognitive functions: Cerebral infarction   Activity Tolerance Patient tolerated treatment  well   Patient Left in bed;with call bell/phone within reach;with nursing/sitter in room     Time: 1446-1521 OT Time Calculation (min): 35 min  Charges: OT General Charges $OT Visit: 1 Visit OT Treatments $Therapeutic Activity: 23-37 mins    Simonne Come, [phone] 06/26/2019, 3:54 PM

## 2019-06-26 NOTE — Progress Notes (Signed)
Physical Therapy Treatment Patient Details Name: William [name] MRN: [id number] DOB: February 11, 1986 Today's Date: 06/26/2019    History of Present Illness 33 year old male with a history of hypertension who initially presented with chest tightness, shortness of breath, and intermittent headaches.  Wife also noted decreased energy, sluggish appearing, and constipation. BP in ED 254/167. Admitted to ICU 06/22/19.  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 Pt with elevated troponin assessed by cardiology to be likely due to demand ischemia.     PT Comments    Pt was seen for mobility with focus on controlling steps with IV pole, and nursing asked PT about walking to get a shower.  Talked with her about the need to use gait belt and that the IV pole was helpful given pt listing to R at times.  Recommended she consider a second person but that pt was definitely able to control balance with PT.  Will continue on with regular sessions until transition to rehab, with a focus on safety and awareness of controlling his steps with even a RW if needed.  Pt is motivated although impulsive.  Will not recommend longer trips to walk until his BP is more controlled.   Follow Up Recommendations  CIR     Equipment Recommendations       Recommendations for Other Services Rehab consult     Precautions / Restrictions Precautions Precautions: Fall;Other (comment)(monitor BP) Precaution Comments: BP was up but MD gave OK for short walk when he stepped into room Restrictions Weight Bearing Restrictions: No    Mobility  Bed Mobility Overal bed mobility: Needs Assistance Bed Mobility: Supine to Sit;Sit to Supine     Supine to sit: Min guard Sit to supine: Min guard      Transfers Overall transfer level: Needs assistance Equipment used: 1 person hand held  assist Transfers: Sit to/from Stand Sit to Stand: Min assist;From elevated surface         General transfer comment: pt did not have any of the dramatic symptoms he previously noted  Ambulation/Gait Ambulation/Gait assistance: Min assist Gait Distance (Feet): 60 Feet(30 x 2) Assistive device: 1 person hand held assist;IV Pole Gait Pattern/deviations: Step-through pattern Gait velocity: reduced Gait velocity interpretation: <1.8 ft/sec, indicate of risk for recurrent falls General Gait Details: milder ataxia today, could control with IV pole and vc's to take his time, fewer crossed steps    Stairs             Wheelchair Mobility    Modified Rankin (Stroke Patients Only)       Balance Overall balance assessment: Needs assistance Sitting-balance support: Feet supported Sitting balance-Leahy Scale: Good     Standing balance support: Bilateral upper extremity supported;During functional activity Standing balance-Leahy Scale: Poor Standing balance comment: pt did not have strong reaction with dizziness to standing despite diastolic BP 355 on last ck                            Cognition Arousal/Alertness: Awake/alert Behavior During Therapy: Impulsive Overall Cognitive Status: Impaired/Different from baseline Area of Impairment: Attention;Following commands;Safety/judgement;Awareness;Problem solving                 Orientation Level: Situation Current Attention Level: Selective Memory: Decreased recall of precautions;Decreased short-term memory Following Commands: Follows one step commands inconsistently;Follows one step commands with increased time  Safety/Judgement: Decreased awareness of safety;Decreased awareness of deficits Awareness: Intellectual Problem Solving: Slow processing;Requires verbal cues        Exercises General Exercises - Lower Extremity Long Arc Quad: Strengthening;Both;10 reps Heel Slides: Strengthening;Both;10  reps;Seated Hip ABduction/ADduction: Strengthening;Both;20 reps    General Comments General comments (skin integrity, edema, etc.): elevation of BP was ongoing as previously with therapy but MD gave approval for PT to see pt when he arrived to see pt      Pertinent Vitals/Pain Pain Assessment: No/denies pain    Home Living                      Prior Function            PT Goals (current goals can now be found in the care plan section) Acute Rehab PT Goals Patient Stated Goal: to get home Progress towards PT goals: Progressing toward goals(will still benefit from CIR as pt is unsafe to walk alone)    Frequency    Min 3X/week      PT Plan Current plan remains appropriate    Co-evaluation              AM-PAC PT "6 Clicks" Mobility   Outcome Measure  Help needed turning from your back to your side while in a flat bed without using bedrails?: None Help needed moving from lying on your back to sitting on the side of a flat bed without using bedrails?: None Help needed moving to and from a bed to a chair (including a wheelchair)?: None Help needed standing up from a chair using your arms (e.g., wheelchair or bedside chair)?: A Little Help needed to walk in hospital room?: A Little Help needed climbing 3-5 steps with a railing? : Total 6 Click Score: 19    End of Session Equipment Utilized During Treatment: Gait belt Activity Tolerance: Treatment limited secondary to medical complications (Comment)(maintained only short trips due to BP) Patient left: in bed;with call bell/phone within reach;with bed alarm set;with nursing/sitter in room Nurse Communication: Mobility status PT Visit Diagnosis: Unsteadiness on feet (R26.81);Other abnormalities of gait and mobility (R26.89);Muscle weakness (generalized) (M62.81);Ataxic gait (R26.0);Difficulty in walking, not elsewhere classified (R26.2);Other symptoms and signs involving the nervous system (R29.898);Dizziness and  giddiness (R42)     Time: 1610-9604 PT Time Calculation (min) (ACUTE ONLY): 20 min  Charges:  $Gait Training: 8-22 mins                    Ramond Dial 06/26/2019, 11:48 AM  Mee Hives, PT MS Acute Rehab Dept. Number: [place] [facility] [place]

## 2019-06-27 ENCOUNTER — Inpatient Hospital Stay (HOSPITAL_COMMUNITY): Payer: Medicaid Other

## 2019-06-27 DIAGNOSIS — I10 Essential (primary) hypertension: Secondary | ICD-10-CM

## 2019-06-27 LAB — CBC
HCT: 34.2 % — ABNORMAL LOW (ref 39.0–52.0)
Hemoglobin: 11.7 g/dL — ABNORMAL LOW (ref 13.0–17.0)
MCH: 31.7 pg (ref 26.0–34.0)
MCHC: 34.2 g/dL (ref 30.0–36.0)
MCV: 92.7 fL (ref 80.0–100.0)
Platelets: 301 10*3/uL (ref 150–400)
RBC: 3.69 MIL/uL — ABNORMAL LOW (ref 4.22–5.81)
RDW: 12.2 % (ref 11.5–15.5)
WBC: 9.5 10*3/uL (ref 4.0–10.5)
nRBC: 0 % (ref 0.0–0.2)

## 2019-06-27 LAB — RENAL FUNCTION PANEL
Albumin: 3.2 g/dL — ABNORMAL LOW (ref 3.5–5.0)
Anion gap: 11 (ref 5–15)
BUN: 23 mg/dL — ABNORMAL HIGH (ref 6–20)
CO2: 25 mmol/L (ref 22–32)
Calcium: 9.1 mg/dL (ref 8.9–10.3)
Chloride: 102 mmol/L (ref 98–111)
Creatinine, Ser: 5.15 mg/dL — ABNORMAL HIGH (ref 0.61–1.24)
GFR calc Af Amer: 16 mL/min — ABNORMAL LOW (ref >60)
GFR calc non Af Amer: 13 mL/min — ABNORMAL LOW (ref >60)
Glucose, Bld: 112 mg/dL — ABNORMAL HIGH (ref 70–99)
Phosphorus: 3.9 mg/dL (ref 2.5–4.6)
Potassium: 2.9 mmol/L — ABNORMAL LOW (ref 3.5–5.1)
Sodium: 138 mmol/L (ref 135–145)

## 2019-06-27 LAB — CULTURE, BLOOD (ROUTINE X 2)
Culture: NO GROWTH
Culture: NO GROWTH
Special Requests: ADEQUATE
Special Requests: ADEQUATE

## 2019-06-27 LAB — SEDIMENTATION RATE: Sed Rate: 41 mm/hr — ABNORMAL HIGH (ref 0–16)

## 2019-06-27 LAB — MAGNESIUM: Magnesium: 1.8 mg/dL (ref 1.7–2.4)

## 2019-06-27 MED ORDER — POTASSIUM CHLORIDE CRYS ER 20 MEQ PO TBCR
40.0000 meq | EXTENDED_RELEASE_TABLET | Freq: Once | ORAL | Status: AC
  Administered 2019-06-27: 40 meq via ORAL
  Filled 2019-06-27: qty 2

## 2019-06-27 MED ORDER — CARVEDILOL 6.25 MG PO TABS
6.2500 mg | ORAL_TABLET | Freq: Two times a day (BID) | ORAL | Status: DC
  Administered 2019-06-27 (×2): 6.25 mg via ORAL
  Filled 2019-06-27 (×2): qty 1

## 2019-06-27 MED ORDER — DILTIAZEM HCL 30 MG PO TABS
120.0000 mg | ORAL_TABLET | Freq: Three times a day (TID) | ORAL | Status: DC
  Administered 2019-06-27 – 2019-06-29 (×7): 120 mg via ORAL
  Filled 2019-06-27 (×7): qty 4

## 2019-06-27 NOTE — Progress Notes (Signed)
  Speech Language Pathology Treatment: Cognitive-Linquistic  Patient Details Name: William [name] MRN: [id number] DOB: [birth year] Today's Date: 06/27/2019 Time: 1030-1040 SLP Time Calculation (min) (ACUTE ONLY): 10 min  Assessment / Plan / Recommendation Clinical Impression  Pt was seen for cognitive-linguistic treatment session and was cooperative throughout the session. The session was significantly abbreviated due to transport arriving to take the pt to vascular. Pt demonstrated reduced awareness of his deficits or the impact that these deficits could have on his ability to return to work. He demonstrated 40% accuracy with 5-item immediate recall increasing to 100% with min-mod cues. Pt achieved 100% accuracy with 4-item immediate recall. SLP will continue to follow pt and will possibly return later today for an additional session.    HPI HPI: Pt is a 33 year old male admitted 7/9 with hypertensive crisis and started on nicardipine infusion. Hx of recent substance cocaine abuse. MRI of head revealed widespread abnormal findings including numerous scattered acute infarcts in both cerebral hemispheres and the brainstem, underlying chronic small vessel disease in the brain, including chronic microhemorrhages in the deep gray matter nuclei and brainstem, and corpus callosum and cerebral white matter disease which in some ways resembled demyelination.      SLP Plan  Continue with current plan of care       Recommendations                   Plan: Continue with current plan of care       Madalynn Pickelsimer I. Hardin Negus, [place], [place] Office number [phone] Pager [place] 06/27/2019, 12:18 PM

## 2019-06-27 NOTE — Progress Notes (Signed)
Renal Duplex has been completed.   Preliminary results in CV Proc.   Abram Sander 06/27/2019 11:17 AM

## 2019-06-27 NOTE — Progress Notes (Addendum)
PROGRESS NOTE    Bliss Tsang  ZOX:[id number] DOB: January 24, 1986 DOA: 06/21/2019 PCP: Patient, No Pcp Per   Brief Narrative: Ronon Ferger is a 33 y.o. male with history of cocaine abuse, hyperlipidemia, hypertension.  Patient presented secondary to shortness of breath.  He was found to have significantly elevated blood pressure with evidence of kidney failure concerning for hypertensive emergency.  Patient was admitted to the ICU and placed on a Cardene drip.  Patient had associated mental status changes and imaging suggests multiple infarcts with possible evidence of PRES.  Neurology is on board and helping to manage.  Cardiology was consulted as well to help manage hypertension in addition to elevated troponin.   Assessment & Plan:   Active Problems:   Hypertensive emergency   Acute renal failure (HCC)   Cerebral thrombosis with cerebral infarction   Hypertensive emergency Patient was initially better controlled while on Cardene drip.  He was placed on Cardizem in addition to and hydralazine IV PRN.  Blood pressure over the last 24 hours has been significantly elevated.  Cardiology signed off.  Goal blood pressure is currently less than 220/120 per neurology recommendations. Attempting to avoid BB secondary to ongoing cocaine usage -Nephrology recommendations: Cardizem p.o. to 120 mg TID and hydralazine 50 mg TID, renal US duplex, renan/aldosterone pending -Continue hydralazine IV -Continued Nephrology help with antihypertensive regimen as this is tied into kidneys  Acute renal failure Patient with associated wedge-shaped areas of both kidneys concerning for bilateral renal infarcts versus bilateral pyelonephritis.  Etiology is ?secondary to malignant hypertension. CT abdomen/pelvis not significant for renal infarcts. Patient with good urine output.  Creatinine still significantly elevated with minimal improvement. Concern this may be slow to improve, if improves at all. UOP of 1.95 L  over last 24 hours -Nephrology recommendations pending today  Acute CVA Bilateral anterior and posterior circulation infarcts.  Also evidence of edema of the brainstem concerning for PRES versus osmotic demyelination.  Neurology was consulted.  Echo significant for now embolic source.  LDL of 158, hemoglobin A1c of 4.9%.  Patient with a history of cocaine use. -Neurology recommendations: DAPT x3 weeks followed by aspirin alone -PT/OT recommending CIR: placement pending medical readiness  Demand ischemia Patient presented with increasingly worsening troponin.  Cardiology was consulted with initial plans for an eventual cardiac catheterization.  Etiology thought to be secondary to significant hypertension, severe LVH, cardiac.  Hyperlipidemia LDL of 158. -Continue Lipitor 40 mg daily  Cocaine abuse He will need to cease use on discharge.   DVT prophylaxis: SCDs Code Status:   Code Status: Full Code Family Communication: None Disposition Plan: Discharge to CIR once blood pressure better controlled and renal function improved   Consultants:   Cardiology  PCCM  Neurology/stroke team  Nephrology  Procedures:   06/22/2019: Echocardiogram IMPRESSIONS    1. The left ventricle has hyperdynamic systolic function, with an ejection fraction of >65%. The cavity size was decreased. There is severe concentric left ventricular hypertrophy. Left ventricular diastolic parameters were normal. No evidence of left  ventricular regional wall motion abnormalities.  2. The right ventricle has normal systolic function. The cavity was normal. There is no increase in right ventricular wall thickness. Right ventricular systolic pressure could not be assessed.  3. Small pericardial effusion.  4. No evidence of mitral valve stenosis.  5. The aortic valve is tricuspid. No stenosis of the aortic valve.  Antimicrobials:  None   Subjective: No issues today  Objective: Vitals:   06/26/19 2328  06/26/19  2330 06/27/19 0514 06/27/19 0800  BP: (!) 202/121 (!) 196/122 (!) 200/128 (!) 185/100  Pulse: 87 87 93 91  Resp: 18  18 18   Temp: 97.6 F (36.4 C)  98.2 F (36.8 C) 98.5 F (36.9 C)  TempSrc: Oral  Oral Oral  SpO2: 96% 99% 100% 100%  Weight:      Height:        Intake/Output Summary (Last 24 hours) at 06/27/2019 1035 Last data filed at 06/27/2019 0800 Gross per 24 hour  Intake 480 ml  Output 2450 ml  Net -1970 ml   Filed Weights   06/23/19 1435 06/24/19 0236 06/25/19 0315  Weight: 82.2 kg 85.3 kg 85.7 kg    Examination:  General exam: Appears calm and comfortable. Appears more upbeat today Respiratory system: Clear to auscultation. Respiratory effort normal. Cardiovascular system: S1 & S2 heard, RRR. 2/6 systolic murmur Gastrointestinal system: Abdomen is nondistended, soft and nontender. No organomegaly or masses felt. Normal bowel sounds heard. Central nervous system: Alert and oriented. No focal neurological deficits. Extremities: No edema. No calf tenderness Skin: No cyanosis. No rashes Psychiatry: Judgement and insight appear normal. Mood & affect appropriate.      Data Reviewed: I have personally reviewed following labs and imaging studies  CBC: Recent Labs  Lab 06/23/19 0607 06/24/19 0502 06/25/19 0517 06/26/19 0853 06/27/19 0507  WBC 11.8* 10.3 9.8 10.0 9.5  HGB 12.0* 12.3* 12.0* 13.1 11.7*  HCT 35.1* 36.1* 35.8* 37.7* 34.2*  MCV 92.1 93.3 92.5 90.8 92.7  PLT 228 237 244 299 654   Basic Metabolic Panel: Recent Labs  Lab 06/22/19 1324 06/22/19 1438 06/23/19 0607 06/24/19 0502 06/25/19 0756 06/26/19 0853 06/27/19 0507  NA 134*  --  140 140 135 139 138  K 3.2*  --  3.1* 2.9* 2.7* 3.0* 2.9*  CL 97*  --  103 102 97* 100 102  CO2 24  --  25 23 25 27 25   GLUCOSE 112*  --  106* 93 133* 118* 112*  BUN 29*  --  26* 23* 25* 23* 23*  CREATININE 5.43*  --  5.36* 5.30* 5.21* 5.37* 5.15*  CALCIUM 9.1  --  9.1 8.8* 9.1 9.3 9.1  MG 2.3  --  2.2   --   --   --   --   PHOS  --  3.5 2.8  --  3.5 3.0 3.9   GFR: Estimated Creatinine Clearance: 21.9 mL/min (A) (by C-G formula based on SCr of 5.15 mg/dL (H)). Liver Function Tests: Recent Labs  Lab 06/21/19 2354 06/23/19 0607 06/25/19 0756 06/26/19 0853 06/27/19 0507  AST 21 59*  --   --   --   ALT 15 18  --   --   --   ALKPHOS 81 67  --   --   --   BILITOT 1.1 1.1  --   --   --   PROT 7.2 6.3*  --   --   --   ALBUMIN 3.4* 3.2* 3.3* 3.4* 3.2*   No results for input(s): LIPASE, AMYLASE in the last 168 hours. No results for input(s): AMMONIA in the last 168 hours. Coagulation Profile: No results for input(s): INR, PROTIME in the last 168 hours. Cardiac Enzymes: No results for input(s): CKTOTAL, CKMB, CKMBINDEX, TROPONINI in the last 168 hours. BNP (last 3 results) No results for input(s): PROBNP in the last 8760 hours. HbA1C: No results for input(s): HGBA1C in the last 72 hours. CBG: Recent Labs  Lab  06/22/19 1739  GLUCAP 119*   Lipid Profile: No results for input(s): CHOL, HDL, LDLCALC, TRIG, CHOLHDL, LDLDIRECT in the last 72 hours. Thyroid Function Tests: No results for input(s): TSH, T4TOTAL, FREET4, T3FREE, THYROIDAB in the last 72 hours. Anemia Panel: No results for input(s): VITAMINB12, FOLATE, FERRITIN, TIBC, IRON, RETICCTPCT in the last 72 hours. Sepsis Labs: Recent Labs  Lab 06/22/19 1938 06/23/19 0607 06/24/19 0502  PROCALCITON 0.31 0.34 0.22    Recent Results (from the past 240 hour(s))  SARS Coronavirus 2 (CEPHEID - Performed in [facility] hospital lab), Hosp Order     Status: None   Collection Time: 06/22/19 12:20 AM   Specimen: Nasopharyngeal Swab  Result Value Ref Range Status   SARS Coronavirus 2 NEGATIVE NEGATIVE Final    Comment: (NOTE) If result is NEGATIVE SARS-CoV-2 target nucleic acids are NOT DETECTED. The SARS-CoV-2 RNA is generally detectable in upper and lower  respiratory specimens during the acute phase of infection. The lowest   concentration of SARS-CoV-2 viral copies this assay can detect is 250  copies / mL. A negative result does not preclude SARS-CoV-2 infection  and should not be used as the sole basis for treatment or other  patient management decisions.  A negative result may occur with  improper specimen collection / handling, submission of specimen other  than nasopharyngeal swab, presence of viral mutation(s) within the  areas targeted by this assay, and inadequate number of viral copies  (<250 copies / mL). A negative result must be combined with clinical  observations, patient history, and epidemiological information. If result is POSITIVE SARS-CoV-2 target nucleic acids are DETECTED. The SARS-CoV-2 RNA is generally detectable in upper and lower  respiratory specimens dur ing the acute phase of infection.  Positive  results are indicative of active infection with SARS-CoV-2.  Clinical  correlation with patient history and other diagnostic information is  necessary to determine patient infection status.  Positive results do  not rule out bacterial infection or co-infection with other viruses. If result is PRESUMPTIVE POSTIVE SARS-CoV-2 nucleic acids MAY BE PRESENT.   A presumptive positive result was obtained on the submitted specimen  and confirmed on repeat testing.  While 2019 novel coronavirus  (SARS-CoV-2) nucleic acids may be present in the submitted sample  additional confirmatory testing may be necessary for epidemiological  and / or clinical management purposes  to differentiate between  SARS-CoV-2 and other Sarbecovirus currently known to infect humans.  If clinically indicated additional testing with an alternate test  methodology [phone]) is advised. The SARS-CoV-2 RNA is generally  detectable in upper and lower respiratory sp ecimens during the acute  phase of infection. The expected result is Negative. Fact Sheet for Patients:  StrictlyIdeas.no Fact Sheet  for Healthcare Providers: BankingDealers.co.za This test is not yet approved or cleared by the [place] FDA and has been authorized for detection and/or diagnosis of SARS-CoV-2 by FDA under an Emergency Use Authorization (EUA).  This EUA will remain in effect (meaning this test can be used) for the duration of the COVID-19 declaration under Section 564(b)(1) of the Act, 21 U.S.C. section 360bbb-3(b)(1), unless the authorization is terminated or revoked sooner. Performed at [facility], [place] [address] [place], [place] [address]   MRSA PCR Screening     Status: None   Collection Time: 06/22/19  5:28 AM   Specimen: Nasal Mucosa; Nasopharyngeal  Result Value Ref Range Status   MRSA by PCR NEGATIVE NEGATIVE Final    Comment:  The GeneXpert MRSA Assay (FDA approved for NASAL specimens only), is one component of a comprehensive MRSA colonization surveillance program. It is not intended to diagnose MRSA infection nor to guide or monitor treatment for MRSA infections. Performed at [facility], [place] [address] [place], [place] [address]   Culture, blood (Routine X 2) w Reflex to ID Panel     Status: None (Preliminary result)   Collection Time: 06/22/19  7:38 PM   Specimen: BLOOD  Result Value Ref Range Status   Specimen Description BLOOD LEFT ANTECUBITAL  Final   Special Requests   Final    BOTTLES DRAWN AEROBIC ONLY Blood Culture adequate volume   Culture   Final    NO GROWTH 4 DAYS Performed at [facility], [place] [address], [place], [place] [address]    Report Status PENDING  Incomplete  Culture, blood (Routine X 2) w Reflex to ID Panel     Status: None (Preliminary result)   Collection Time: 06/22/19  7:38 PM   Specimen: BLOOD RIGHT HAND  Result Value Ref Range Status   Specimen Description BLOOD RIGHT HAND  Final   Special Requests   Final    BOTTLES DRAWN AEROBIC ONLY Blood Culture adequate volume   Culture   Final    NO  GROWTH 4 DAYS Performed at [facility], [place] [address], [place], [place] [address]    Report Status PENDING  Incomplete         Radiology Studies: Ct Abdomen Wo Contrast  Result Date: 06/25/2019 CLINICAL DATA:  Renal failure. Renal infarcts suggested by ultrasound. EXAM: CT ABDOMEN WITHOUT CONTRAST TECHNIQUE: Multidetector CT imaging of the abdomen was performed following the standard protocol without IV contrast. COMPARISON:  Renal ultrasound June 23, 2019 FINDINGS: Lower chest: No acute abnormality. Hepatobiliary: No focal liver abnormality is seen. Question gallbladder wall thickening with small amount of pericholecystic fluid. Pancreas: Unremarkable. No pancreatic ductal dilatation or surrounding inflammatory changes. Spleen: Normal in size without focal abnormality. Adrenals/Urinary Tract: Adrenal glands are unremarkable. Kidneys are without renal calculi, focal lesion, or hydronephrosis. Minimal bilateral perirenal fat stranding. Stomach/Bowel: Stomach is within normal limits. Appendix appears normal. No evidence of bowel wall thickening, distention, or inflammatory changes. Vascular/Lymphatic: No significant vascular findings are present. No enlarged abdominal or pelvic lymph nodes. Other: No abdominal wall hernia or abnormality. Musculoskeletal: No acute or significant osseous findings. IMPRESSION: 1. Question gallbladder wall thickening with small amount of pericholecystic fluid. Further evaluation with right upper quadrant ultrasound may be considered. 2. Minimal bilateral perirenal fat stranding, nonspecific. Electronically Signed   By: Fidela Salisbury M.D.   On: 06/25/2019 19:48        Scheduled Meds: . aspirin  81 mg Oral Daily  . atorvastatin  40 mg Oral q1800  . carvedilol  6.25 mg Oral BID WC  . Chlorhexidine Gluconate Cloth  6 each Topical Daily  . diltiazem  120 mg Oral Q8H  . hydrALAZINE  50 mg Oral Q8H  . nicotine  14 mg Transdermal Daily   Continuous  Infusions:    LOS: 5 days     Cordelia Poche, MD [facility] 06/27/2019, 10:35 AM  If 7PM-7AM, please contact night-coverage [email or web address]

## 2019-06-27 NOTE — Progress Notes (Signed)
[facility] Progress Note   Subjective:  Feels well, no new complaints.  No uremic symptoms.  No edema.  Couldn't particularly tell when BP was up.   Objective Vitals:   06/26/19 2328 06/26/19 2330 06/27/19 0514 06/27/19 0800  BP: (!) 202/121 (!) 196/122 (!) 200/128 (!) 185/100  Pulse: 87 87 93 91  Resp: '18  18 18  ' Temp: 97.6 F (36.4 C)  98.2 F (36.8 C) 98.5 F (36.9 C)  TempSrc: Oral  Oral Oral  SpO2: 96% 99% 100% 100%  Weight:      Height:       Physical Exam General: well appearing lying in bed Heart: RRR Lungs: clear to bases Abdomen: soft, nontender, no bruits Extremities: no edema  Additional Objective Labs: Basic Metabolic Panel: Recent Labs  Lab 06/25/19 0756 06/26/19 0853 06/27/19 0507  NA 135 139 138  K 2.7* 3.0* 2.9*  CL 97* 100 102  CO2 '25 27 25  ' GLUCOSE 133* 118* 112*  BUN 25* 23* 23*  CREATININE 5.21* 5.37* 5.15*  CALCIUM 9.1 9.3 9.1  PHOS 3.5 3.0 3.9   Liver Function Tests: Recent Labs  Lab 06/21/19 2354 06/23/19 0607 06/25/19 0756 06/26/19 0853 06/27/19 0507  AST 21 59*  --   --   --   ALT 15 18  --   --   --   ALKPHOS 81 67  --   --   --   BILITOT 1.1 1.1  --   --   --   PROT 7.2 6.3*  --   --   --   ALBUMIN 3.4* 3.2* 3.3* 3.4* 3.2*   No results for input(s): LIPASE, AMYLASE in the last 168 hours. CBC: Recent Labs  Lab 06/23/19 0607 06/24/19 0502 06/25/19 0517 06/26/19 0853 06/27/19 0507  WBC 11.8* 10.3 9.8 10.0 9.5  HGB 12.0* 12.3* 12.0* 13.1 11.7*  HCT 35.1* 36.1* 35.8* 37.7* 34.2*  MCV 92.1 93.3 92.5 90.8 92.7  PLT 228 237 244 299 301   Blood Culture    Component Value Date/Time   SDES BLOOD LEFT ANTECUBITAL 06/22/2019 1938   SDES BLOOD RIGHT HAND 06/22/2019 1938   SPECREQUEST  06/22/2019 1938    BOTTLES DRAWN AEROBIC ONLY Blood Culture adequate volume   SPECREQUEST  06/22/2019 1938    BOTTLES DRAWN AEROBIC ONLY Blood Culture adequate volume   CULT  06/22/2019 1938    NO GROWTH 4  DAYS Performed at [facility], [place] [address], [place], [place] [address]    CULT  06/22/2019 1938    NO GROWTH 4 DAYS Performed at [facility], [place] [address], [place], [place] [address]    REPTSTATUS PENDING 06/22/2019 1938   REPTSTATUS PENDING 06/22/2019 1938    Cardiac Enzymes: No results for input(s): CKTOTAL, CKMB, CKMBINDEX, TROPONINI in the last 168 hours. CBG: Recent Labs  Lab 06/22/19 1739  GLUCAP 119*   Iron Studies: No results for input(s): IRON, TIBC, TRANSFERRIN, FERRITIN in the last 72 hours. '@[email or web address]' @ Studies/Results: Ct Abdomen Wo Contrast  Result Date: 06/25/2019 CLINICAL DATA:  Renal failure. Renal infarcts suggested by ultrasound. EXAM: CT ABDOMEN WITHOUT CONTRAST TECHNIQUE: Multidetector CT imaging of the abdomen was performed following the standard protocol without IV contrast. COMPARISON:  Renal ultrasound June 23, 2019 FINDINGS: Lower chest: No acute abnormality. Hepatobiliary: No focal liver abnormality is seen. Question gallbladder wall thickening with small amount of pericholecystic fluid. Pancreas: Unremarkable. No pancreatic ductal dilatation or surrounding inflammatory changes. Spleen: Normal in  size without focal abnormality. Adrenals/Urinary Tract: Adrenal glands are unremarkable. Kidneys are without renal calculi, focal lesion, or hydronephrosis. Minimal bilateral perirenal fat stranding. Stomach/Bowel: Stomach is within normal limits. Appendix appears normal. No evidence of bowel wall thickening, distention, or inflammatory changes. Vascular/Lymphatic: No significant vascular findings are present. No enlarged abdominal or pelvic lymph nodes. Other: No abdominal wall hernia or abnormality. Musculoskeletal: No acute or significant osseous findings. IMPRESSION: 1. Question gallbladder wall thickening with small amount of pericholecystic fluid. Further evaluation with right upper quadrant ultrasound may be considered. 2. Minimal bilateral  perirenal fat stranding, nonspecific. Electronically Signed   By: Fidela Salisbury M.D.   On: 06/25/2019 19:48   Medications:  . aspirin  81 mg Oral Daily  . atorvastatin  40 mg Oral q1800  . Chlorhexidine Gluconate Cloth  6 each Topical Daily  . diltiazem  60 mg Oral Q6H  . hydrALAZINE  50 mg Oral Q8H  . nicotine  14 mg Transdermal Daily    Assessment/Plan: ** AKI:  Cr 1.12 on 12/2017 > 06/21/19 presentation 4.95 and now hovering in the mid 5s, today 5.37, eGFR 13.  Nonoliguric with 1.4L UOP yesterday.   06/23/19 renal US - 10.9 and 10.2cm, echogenic; wedge shaped hyperechoic areas BL = ddx infarcts vs pyelonephritis; No renal infarcts noted on CT a/p from 06/25/19 (noncontrast).  7/9 UA  +++ proteinuria, + glucose, mod blood, neg LE and nit. Microscopy with 0-5 RBC/hpf, no WBC.  UNa 7/9 <10.  UP/C - requested again to be sent.    Suspect AKI due to hypertensive urgency/emergency.  I do not see an indication for renal biopsy.  AKI due to hypertension can be slow to improve and many times irreversible.  No indications for RRT at this time, will follow closely.    **  HTN emergency:  Dx with HTN several years ago, no consistent treatment.  MRI 7/9 with BL infarcts (acute in both hemispheres and brainstem), chronic small vessel changes, consider PRES. Adrenal glands normal on 06/25/19 CT.  BP had initially improved but since yesterday AM has gone back up - 190-200s.  Currently on hydralazine 50 TID and diltiazem 60 q6hrs.  Cont hydralazine 50 TID, change  Diltiazem to 120 q8h, add coreg 6.25 BID. Goal for short term - next 1-2 days is 160/100.  No RAAS inhibitors now in setting of such low GFR and lack of consistent f/u in the past.   Renin, aldo levels pending.  Will check renal artery dopplers for completeness.   **Cocaine use: counseled for impact on health esp in setting of HTN.   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  TTE without vegetation.  Per cardiology  note, they d/w neuro and not thought to be embolic.  Plan for CIR post acute hospitalization.   **Dispo:  His renal function likely will be very slow to improve and may not improve substantially - he can f/u in clinic for that.  His BP does need to be better controlled prior to discharge.    Jannifer Hick MD 06/27/2019, 9:11 AM  [facility] Pager: [phone]

## 2019-06-27 NOTE — Progress Notes (Signed)
Physical Therapy Treatment Patient Details Name: William [name] MRN: [id number] DOB: 11-29-1986 Today's Date: 06/27/2019    History of Present Illness 33 year old male with a history of hypertension who initially presented with chest tightness, shortness of breath, and intermittent headaches.  Wife also noted decreased energy, sluggish appearing, and constipation. BP in ED 254/167. Admitted to ICU 06/22/19.  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 Pt with elevated troponin assessed by cardiology to be likely due to demand ischemia.     PT Comments    Patient seen for mobility progression. Pt requires min/mod A for gait training this session. Pt presents with impaired balance/coordination and with multiple LOB requiring assistance to recover. Pt with significant gait deviations with challenges while gait training without AD. Continue to progress as tolerated.      Follow Up Recommendations  CIR     Equipment Recommendations  Other (comment)(TBD)    Recommendations for Other Services       Precautions / Restrictions Precautions Precautions: Fall;Other (comment)(monitor BP) Precaution Comments: monitor BP    Mobility  Bed Mobility Overal bed mobility: Needs Assistance Bed Mobility: Supine to Sit     Supine to sit: Supervision     General bed mobility comments: supervision for safety  Transfers Overall transfer level: Needs assistance Equipment used: None Transfers: Sit to/from Stand Sit to Stand: Min guard         General transfer comment: min guard for safety; pt able to stand without physical assistance  Ambulation/Gait Ambulation/Gait assistance: Min assist;Mod assist Gait Distance (Feet): 200 Feet Assistive device: (assist at trunk with gait belt ) Gait Pattern/deviations: Step-through pattern;Decreased stride  length;Scissoring;Staggering right;Staggering left;Narrow base of support     General Gait Details: pt uncoordinated and with multiple LOB requiring assistance to recover; significant gait deviations with head turns, directional changes, turning, and changes to gait speed   Stairs             Wheelchair Mobility    Modified Rankin (Stroke Patients Only)       Balance Overall balance assessment: Needs assistance Sitting-balance support: Feet supported Sitting balance-Leahy Scale: Good     Standing balance support: During functional activity Standing balance-Leahy Scale: Poor                              Cognition Arousal/Alertness: Awake/alert Behavior During Therapy: WFL for tasks assessed/performed Overall Cognitive Status: Impaired/Different from baseline Area of Impairment: Safety/judgement                         Safety/Judgement: Decreased awareness of safety;Decreased awareness of deficits            Exercises      General Comments General comments (skin integrity, edema, etc.): BP 180/111 prior to mobilizing       Pertinent Vitals/Pain Pain Assessment: No/denies pain    Home Living                      Prior Function            PT Goals (current goals can now be found in the care plan section) Progress towards PT goals: Progressing toward goals    Frequency    Min 3X/week      PT Plan Current plan remains appropriate  Co-evaluation              AM-PAC PT "6 Clicks" Mobility   Outcome Measure  Help needed turning from your back to your side while in a flat bed without using bedrails?: None Help needed moving from lying on your back to sitting on the side of a flat bed without using bedrails?: None Help needed moving to and from a bed to a chair (including a wheelchair)?: A Little Help needed standing up from a chair using your arms (e.g., wheelchair or bedside chair)?: A Little Help needed  to walk in hospital room?: A Little Help needed climbing 3-5 steps with a railing? : Total 6 Click Score: 18    End of Session Equipment Utilized During Treatment: Gait belt Activity Tolerance: Patient tolerated treatment well Patient left: with call bell/phone within reach;in chair;with chair alarm set Nurse Communication: Mobility status PT Visit Diagnosis: Unsteadiness on feet (R26.81);Other abnormalities of gait and mobility (R26.89);Muscle weakness (generalized) (M62.81);Ataxic gait (R26.0);Difficulty in walking, not elsewhere classified (R26.2);Other symptoms and signs involving the nervous system (R29.898);Dizziness and giddiness (R42)     Time: 2072-1828 PT Time Calculation (min) (ACUTE ONLY): 17 min  Charges:  $Gait Training: 8-22 mins                     Earney Navy, PTA Acute Rehabilitation Services Pager: [phone] Office: [phone]     Darliss Cheney 06/27/2019, 4:25 PM

## 2019-06-27 NOTE — Progress Notes (Signed)
  Speech Language Pathology Treatment: Cognitive-Linquistic  Patient Details Name: William [name] MRN: [id number] DOB: 27-Apr-1986 Today's Date: 06/27/2019 Time: 6734-1937 SLP Time Calculation (min) (ACUTE ONLY): 25 min  Assessment / Plan / Recommendation Clinical Impression  Pt was seen for cognitive-linguistic treatment and demonstrated improved awareness of his deficits during the session. He initially stated that he did not have any cognitive-linguistic deficits. However, by the middle of the session he stated, "Hopefully it's not like this all the time and it'll get better". When asked what he was referring to he stated that he would not have had as much difficulty prior to admission He further stated that he was surprised by how much he was struggling to recall information during the session. He was re-educated regarding his deficits and the benefits in his participating in therapy to remediate those deficits. He achieved 83% accuracy with a medication management (prescription) task increasing to 100% with min. cues. He was able to recall objective information from voice mails with 20% accuracy increasing to 70% with mod cues. He completed a 3-task sequencing mental manipulation activity with 60% accuracy increasing to 100% with min-mod cues. He achieved 50% accuracy with time management problems increasing to 83% accuracy with mod cues. SLP will contiune to follow pt.    HPI HPI: Pt is a 33 year old male admitted 7/9 with hypertensive crisis and started on nicardipine infusion. Hx of recent substance cocaine abuse. MRI of head revealed widespread abnormal findings including numerous scattered acute infarcts in both cerebral hemispheres and the brainstem, underlying chronic small vessel disease in the brain, including chronic microhemorrhages in the deep gray matter nuclei and brainstem, and corpus callosum and cerebral white matter disease which in some ways resembled demyelination.      SLP  Plan  Continue with current plan of care       Recommendations                   Follow up Recommendations: Inpatient Rehab SLP Visit Diagnosis: Cognitive communication deficit (T02.409) Plan: Continue with current plan of care       Tyannah Sane I. Hardin Negus, [place], [place] Office number [phone] Pager [place] 06/27/2019, 4:52 PM

## 2019-06-28 LAB — RENAL FUNCTION PANEL
Albumin: 3.2 g/dL — ABNORMAL LOW (ref 3.5–5.0)
Anion gap: 11 (ref 5–15)
BUN: 28 mg/dL — ABNORMAL HIGH (ref 6–20)
CO2: 23 mmol/L (ref 22–32)
Calcium: 9.1 mg/dL (ref 8.9–10.3)
Chloride: 101 mmol/L (ref 98–111)
Creatinine, Ser: 5.6 mg/dL — ABNORMAL HIGH (ref 0.61–1.24)
GFR calc Af Amer: 14 mL/min — ABNORMAL LOW (ref >60)
GFR calc non Af Amer: 12 mL/min — ABNORMAL LOW (ref >60)
Glucose, Bld: 114 mg/dL — ABNORMAL HIGH (ref 70–99)
Phosphorus: 4.3 mg/dL (ref 2.5–4.6)
Potassium: 2.9 mmol/L — ABNORMAL LOW (ref 3.5–5.1)
Sodium: 135 mmol/L (ref 135–145)

## 2019-06-28 LAB — PROTEIN / CREATININE RATIO, URINE
Creatinine, Urine: 73.8 mg/dL
Protein Creatinine Ratio: 2.56 mg/mg{Cre} — ABNORMAL HIGH (ref 0.00–0.15)
Total Protein, Urine: 189 mg/dL

## 2019-06-28 MED ORDER — CARVEDILOL 12.5 MG PO TABS: 12.5000 mg | ORAL_TABLET | Freq: Two times a day (BID) | ORAL | Status: DC

## 2019-06-28 MED ORDER — CARVEDILOL 12.5 MG PO TABS
12.5000 mg | ORAL_TABLET | Freq: Two times a day (BID) | ORAL | Status: DC
  Administered 2019-06-28 (×2): 12.5 mg via ORAL
  Filled 2019-06-28 (×3): qty 1

## 2019-06-28 MED ORDER — CARVEDILOL 6.25 MG PO TABS: 6.2500 mg | ORAL_TABLET | Freq: Once | ORAL | Status: DC

## 2019-06-28 MED ORDER — POTASSIUM CHLORIDE CRYS ER 20 MEQ PO TBCR
40.0000 meq | EXTENDED_RELEASE_TABLET | Freq: Four times a day (QID) | ORAL | Status: AC
  Administered 2019-06-28 (×3): 40 meq via ORAL
  Filled 2019-06-28 (×3): qty 2

## 2019-06-28 NOTE — Progress Notes (Signed)
PROGRESS NOTE    William [name]  PPJ:[id number] DOB: [birth year] DOA: 06/21/2019 PCP: Patient, No Pcp Per   Brief Narrative:  William [name] is a 33 y.o. male with history of cocaine abuse, hyperlipidemia, hypertension.  Patient presented secondary to shortness of breath.  He was found to have significantly elevated blood pressure with evidence of kidney failure concerning for hypertensive emergency.  Patient was admitted to the ICU and placed on a Cardene drip.  Patient had associated mental status changes and imaging suggests multiple infarcts with possible evidence of PRES.  Neurology is on board and helping to manage.  Cardiology was consulted as well to help manage hypertension in addition to elevated troponin.Nephrology following.  Assessment & Plan:   Active Problems:   Hypertensive emergency   Acute renal failure (HCC)   Cerebral thrombosis with cerebral infarction   Hypertensive urgency: Blood pressure still high this morning.  He was initially started on Cardene drip.  Currently on oral antihypertensives: Cardizem, hydralazine, carvedilol.  Renin/aldosterone pending.  Ultrasound duplex did not show any renal artery stenosis.  Continue PRN IV antihypertensives.  Nephrology following.  Acute renal failure: Most likely secondary to chronic accelerated hypertension.  Patient having good urine output.  Creatinine still significantly elevated.  Nephrology following  Acute CVA: Imaging showed bilateral anterior/posterior circulation infarcts.  Also evidence of edema of the brainstem concerning for press versus osmotic demyelination.  Neurology was following.  Echo showed ejection fraction of more than 65%, severe concentric left ventricular hypertrophy. LDL of 158.  Hemoglobin A1c of 4.9.  History of cocaine abuse.  Neurology recommended dual antiplatelet therapy for 3 weeks followed by aspirin alone.  PT/OT recommending CIR.  Elevated troponin: Secondary to supply demand ischemia.   Cardiology was consulted for initial plan for cardiac cath etiology thought to be secondary to significant hypertension, severe LVH  Hyperlipidemia: LDL of 158.  Continue Lipitor  Cocaine abuse: Counseled cessation.         DVT prophylaxis: SCD Code Status: Full Family Communication: None Disposition Plan: Discharge to CIR once blood pressure better controlled and improved renal function   Consultants: Nephrology, cardiology, neurology, PCCM  Procedures: Echocardiogram  Antimicrobials:  Anti-infectives (From admission, onward)   None      Subjective:  Patient seen and examined at bedside this morning.  Comfortable, eating breakfast.  Still hypertensive.  Denies any complaints  Objective: Vitals:   06/28/19 0020 06/28/19 0416 06/28/19 0422 06/28/19 0841  BP: (!) 178/105 (!) 178/126 (!) 160/103 (!) 172/93  Pulse:  87 80 92  Resp: 18 18  18   Temp: 99 F (37.2 C) 99 F (37.2 C)  97.6 F (36.4 C)  TempSrc: Oral Oral  Axillary  SpO2: 99% 97%  100%  Weight:      Height:        Intake/Output Summary (Last 24 hours) at 06/28/2019 1039 Last data filed at 06/27/2019 1945 Gross per 24 hour  Intake 320 ml  Output 725 ml  Net -405 ml   Filed Weights   06/23/19 1435 06/24/19 0236 06/25/19 0315  Weight: 82.2 kg 85.3 kg 85.7 kg    Examination:  General exam: Appears calm and comfortable ,Not in distress,average built HEENT:PERRL,Oral mucosa moist, Ear/Nose normal on gross exam Respiratory system: Bilateral equal air entry, normal vesicular breath sounds, no wheezes or crackles  Cardiovascular system: S1 & S2 heard, RRR. No JVD, murmurs, rubs, gallops or clicks. No pedal edema. Gastrointestinal system: Abdomen is nondistended, soft and nontender. No organomegaly or masses  felt. Normal bowel sounds heard. Central nervous system: Alert and oriented. No focal neurological deficits. Extremities: No edema, no clubbing ,no cyanosis, distal peripheral pulses palpable. Skin:  No rashes, lesions or ulcers,no icterus ,no pallor,tattos    Data Reviewed: I have personally reviewed following labs and imaging studies  CBC: Recent Labs  Lab 06/23/19 0607 06/24/19 0502 06/25/19 0517 06/26/19 0853 06/27/19 0507  WBC 11.8* 10.3 9.8 10.0 9.5  HGB 12.0* 12.3* 12.0* 13.1 11.7*  HCT 35.1* 36.1* 35.8* 37.7* 34.2*  MCV 92.1 93.3 92.5 90.8 92.7  PLT 228 237 244 299 956   Basic Metabolic Panel: Recent Labs  Lab 06/22/19 1324  06/23/19 0607 06/24/19 0502 06/25/19 0756 06/26/19 0853 06/27/19 0507 06/28/19 0451  NA 134*  --  140 140 135 139 138 135  K 3.2*  --  3.1* 2.9* 2.7* 3.0* 2.9* 2.9*  CL 97*  --  103 102 97* 100 102 101  CO2 24  --  25 23 25 27 25 23   GLUCOSE 112*  --  106* 93 133* 118* 112* 114*  BUN 29*  --  26* 23* 25* 23* 23* 28*  CREATININE 5.43*  --  5.36* 5.30* 5.21* 5.37* 5.15* 5.60*  CALCIUM 9.1  --  9.1 8.8* 9.1 9.3 9.1 9.1  MG 2.3  --  2.2  --   --   --  1.8  --   PHOS  --    < > 2.8  --  3.5 3.0 3.9 4.3   < > = values in this interval not displayed.   GFR: Estimated Creatinine Clearance: 20.2 mL/min (A) (by C-G formula based on SCr of 5.6 mg/dL (H)). Liver Function Tests: Recent Labs  Lab 06/21/19 2354 06/23/19 0607 06/25/19 0756 06/26/19 0853 06/27/19 0507 06/28/19 0451  AST 21 59*  --   --   --   --   ALT 15 18  --   --   --   --   ALKPHOS 81 67  --   --   --   --   BILITOT 1.1 1.1  --   --   --   --   PROT 7.2 6.3*  --   --   --   --   ALBUMIN 3.4* 3.2* 3.3* 3.4* 3.2* 3.2*   No results for input(s): LIPASE, AMYLASE in the last 168 hours. No results for input(s): AMMONIA in the last 168 hours. Coagulation Profile: No results for input(s): INR, PROTIME in the last 168 hours. Cardiac Enzymes: No results for input(s): CKTOTAL, CKMB, CKMBINDEX, TROPONINI in the last 168 hours. BNP (last 3 results) No results for input(s): PROBNP in the last 8760 hours. HbA1C: No results for input(s): HGBA1C in the last 72  hours. CBG: Recent Labs  Lab 06/22/19 1739  GLUCAP 119*   Lipid Profile: No results for input(s): CHOL, HDL, LDLCALC, TRIG, CHOLHDL, LDLDIRECT in the last 72 hours. Thyroid Function Tests: No results for input(s): TSH, T4TOTAL, FREET4, T3FREE, THYROIDAB in the last 72 hours. Anemia Panel: No results for input(s): VITAMINB12, FOLATE, FERRITIN, TIBC, IRON, RETICCTPCT in the last 72 hours. Sepsis Labs: Recent Labs  Lab 06/22/19 1938 06/23/19 0607 06/24/19 0502  PROCALCITON 0.31 0.34 0.22    Recent Results (from the past 240 hour(s))  SARS Coronavirus 2 (CEPHEID - Performed in [facility] hospital lab), Hosp Order     Status: None   Collection Time: 06/22/19 12:20 AM   Specimen: Nasopharyngeal Swab  Result Value Ref Range Status  SARS Coronavirus 2 NEGATIVE NEGATIVE Final    Comment: (NOTE) If result is NEGATIVE SARS-CoV-2 target nucleic acids are NOT DETECTED. The SARS-CoV-2 RNA is generally detectable in upper and lower  respiratory specimens during the acute phase of infection. The lowest  concentration of SARS-CoV-2 viral copies this assay can detect is 250  copies / mL. A negative result does not preclude SARS-CoV-2 infection  and should not be used as the sole basis for treatment or other  patient management decisions.  A negative result may occur with  improper specimen collection / handling, submission of specimen other  than nasopharyngeal swab, presence of viral mutation(s) within the  areas targeted by this assay, and inadequate number of viral copies  (<250 copies / mL). A negative result must be combined with clinical  observations, patient history, and epidemiological information. If result is POSITIVE SARS-CoV-2 target nucleic acids are DETECTED. The SARS-CoV-2 RNA is generally detectable in upper and lower  respiratory specimens dur ing the acute phase of infection.  Positive  results are indicative of active infection with SARS-CoV-2.  Clinical   correlation with patient history and other diagnostic information is  necessary to determine patient infection status.  Positive results do  not rule out bacterial infection or co-infection with other viruses. If result is PRESUMPTIVE POSTIVE SARS-CoV-2 nucleic acids MAY BE PRESENT.   A presumptive positive result was obtained on the submitted specimen  and confirmed on repeat testing.  While 2019 novel coronavirus  (SARS-CoV-2) nucleic acids may be present in the submitted sample  additional confirmatory testing may be necessary for epidemiological  and / or clinical management purposes  to differentiate between  SARS-CoV-2 and other Sarbecovirus currently known to infect humans.  If clinically indicated additional testing with an alternate test  methodology [id number]) is advised. The SARS-CoV-2 RNA is generally  detectable in upper and lower respiratory sp ecimens during the acute  phase of infection. The expected result is Negative. Fact Sheet for Patients:  StrictlyIdeas.no Fact Sheet for Healthcare Providers: BankingDealers.co.za This test is not yet approved or cleared by the [place] FDA and has been authorized for detection and/or diagnosis of SARS-CoV-2 by FDA under an Emergency Use Authorization (EUA).  This EUA will remain in effect (meaning this test can be used) for the duration of the COVID-19 declaration under Section 564(b)(1) of the Act, 21 U.S.C. section 360bbb-3(b)(1), unless the authorization is terminated or revoked sooner. Performed at [facility], [place] [address], [place], [place] [address]   MRSA PCR Screening     Status: None   Collection Time: 06/22/19  5:28 AM   Specimen: Nasal Mucosa; Nasopharyngeal  Result Value Ref Range Status   MRSA by PCR NEGATIVE NEGATIVE Final    Comment:        The GeneXpert MRSA Assay (FDA approved for NASAL specimens only), is one component of a comprehensive MRSA  colonization surveillance program. It is not intended to diagnose MRSA infection nor to guide or monitor treatment for MRSA infections. Performed at [facility], [place] [address] [place], [place] [address]   Culture, blood (Routine X 2) w Reflex to ID Panel     Status: None   Collection Time: 06/22/19  7:38 PM   Specimen: BLOOD  Result Value Ref Range Status   Specimen Description BLOOD LEFT ANTECUBITAL  Final   Special Requests   Final    BOTTLES DRAWN AEROBIC ONLY Blood Culture adequate volume   Culture   Final  NO GROWTH 5 DAYS Performed at [facility], [place] [address] [place], [place] [address]    Report Status 06/27/2019 FINAL  Final  Culture, blood (Routine X 2) w Reflex to ID Panel     Status: None   Collection Time: 06/22/19  7:38 PM   Specimen: BLOOD RIGHT HAND  Result Value Ref Range Status   Specimen Description BLOOD RIGHT HAND  Final   Special Requests   Final    BOTTLES DRAWN AEROBIC ONLY Blood Culture adequate volume   Culture   Final    NO GROWTH 5 DAYS Performed at [facility], [place] [address] [place], [place] [address]    Report Status 06/27/2019 FINAL  Final         Radiology Studies: Vas US Renal Artery Duplex  Result Date: 06/27/2019 ABDOMINAL VISCERAL Indications: Hypertension High Risk Factors: Hypertension, hyperlipidemia. Performing Technologist: Abram Sander RVS Supporting Technologist: June Leap RDMS, RVT  Examination Guidelines: A complete evaluation includes B-mode imaging, spectral Doppler, color Doppler, and power Doppler as needed of all accessible portions of each vessel. Bilateral testing is considered an integral part of a complete examination. Limited examinations for reoccurring indications may be performed as noted.  Duplex Findings: +--------------------+--------+--------+------+--------+  Mesenteric           PSV cm/s EDV cm/s Plaque Comments  +--------------------+--------+--------+------+--------+  Aorta at SMA            101       24                     +--------------------+--------+--------+------+--------+  Celiac Artery Origin   146       33                     +--------------------+--------+--------+------+--------+  SMA Proximal           223       25                     +--------------------+--------+--------+------+--------+  +------------------+--------+--------+-------+  Right Renal Artery PSV cm/s EDV cm/s Comment  +------------------+--------+--------+-------+  Origin                47       10             +------------------+--------+--------+-------+  Proximal              35       11             +------------------+--------+--------+-------+  Mid                   35       8              +------------------+--------+--------+-------+  Distal                26       8              +------------------+--------+--------+-------+ +-----------------+--------+--------+-------+  Left Renal Artery PSV cm/s EDV cm/s Comment  +-----------------+--------+--------+-------+  Origin               43       8              +-----------------+--------+--------+-------+  Proximal             32       8              +-----------------+--------+--------+-------+  Mid                  27       8              +-----------------+--------+--------+-------+  Distal               26       8              +-----------------+--------+--------+-------+ +------------+--------+--------+----+-----------+--------+--------+----+  Right Kidney PSV cm/s EDV cm/s RI   Left Kidney PSV cm/s EDV cm/s RI    +------------+--------+--------+----+-----------+--------+--------+----+  Upper Pole   25       6        0.77 Upper Pole  22       6        0.71  +------------+--------+--------+----+-----------+--------+--------+----+  Mid          26       7        0.73 Mid         18       6        0.70  +------------+--------+--------+----+-----------+--------+--------+----+  Lower Pole   17       7        0.61 Lower Pole  20       6        0.70   +------------+--------+--------+----+-----------+--------+--------+----+  Hilar        27       6        0.79 Hilar       22       9        0.60  +------------+--------+--------+----+-----------+--------+--------+----+ +------------------+-----+------------------+----+  Right Kidney             Left Kidney              +------------------+-----+------------------+----+  RAR                      RAR                      +------------------+-----+------------------+----+  RAR (manual)             RAR (manual)             +------------------+-----+------------------+----+  Cortex                   Cortex                   +------------------+-----+------------------+----+  Cortex thickness         Corex thickness          +------------------+-----+------------------+----+  Kidney length (cm) 10.81 Kidney length (cm) 9.94  +------------------+-----+------------------+----+  Summary: Renal:  Right: No evidence of right renal artery stenosis. Abnormal right        Resistive Index. Left:  No evidence of left renal artery stenosis. Normal left        Resistive Index.  *See table(s) above for measurements and observations.  Diagnosing physician: Harold Barban MD  Electronically signed by Harold Barban MD on 06/27/2019 at 3:32:14 PM.    Final         Scheduled Meds:  aspirin  81 mg Oral Daily   atorvastatin  40 mg Oral q1800   carvedilol  12.5 mg Oral BID WC   Chlorhexidine Gluconate Cloth  6 each Topical Daily   diltiazem  120 mg Oral Q8H   hydrALAZINE  50 mg Oral Q8H   nicotine  14 mg Transdermal Daily   potassium chloride  40 mEq Oral Q6H   Continuous Infusions:   LOS: 6 days    Time spent: 35 mins.More than 50% of that time was spent in counseling and/or coordination of care.      Shelly Coss, MD [facility] Pager [phone]  If 7PM-7AM, please contact night-coverage [email or web address] Password [facility] 06/28/2019, 10:39 AM

## 2019-06-28 NOTE — Progress Notes (Signed)
Inpatient Rehabilitation Admissions Coordinator  I met with patient at bedside. Noted sitter removed. Patient speech and cognition much improved. I discussed the need for follow up with PCP, compliance with meds and cessation of drug use. He has lost his job due to [employer] and reports lack of insurance for MD and Meds. I dicussed that he can be connected to Clinic MD and resources for  meds. I await further BP management and recovery of renal issues to hopefully pursue an inpt rehab admission. I will follow up with his wife via phone today. Patient in agreement. I did contact financial counselor yesterday per wife's request, to assist with possible disability and medicaid applications. I will follow up tomorrow.  Danne Baxter, RN, MSN Rehab Admissions Coordinator [phone] 06/28/2019 1:07 PM

## 2019-06-28 NOTE — Progress Notes (Signed)
[facility] Progress Note   Subjective:  Feels well, no new complaints.  No uremic symptoms.  No edema.    Objective Vitals:   06/27/19 1943 06/28/19 0020 06/28/19 0416 06/28/19 0422  BP: (!) 171/116 (!) 178/105 (!) 178/126 (!) 160/103  Pulse: 96  87 80  Resp: _0 Temp: 98.3 F (36.8 C) 99 F (37.2 C) 99 F (37.2 C)   TempSrc: Oral Oral Oral   SpO2: 90% 99% 97%   Weight:      Height:       Physical Exam General: well appearing lying in bed Heart: RRR Lungs: clear to bases Abdomen: soft, nontender Extremities: no edema  Additional Objective Labs: Basic Metabolic Panel: Recent Labs  Lab 06/26/19 0853 06/27/19 0507 06/28/19 0451  NA 139 138 135  K 3.0* 2.9* 2.9*  CL 100 102 101  CO2 _1 GLUCOSE 118* 112* 114*  BUN 23* 23* 28*  CREATININE 5.37* 5.15* 5.60*  CALCIUM 9.3 9.1 9.1  PHOS 3.0 3.9 4.3   Liver Function Tests: Recent Labs  Lab 06/21/19 2354 06/23/19 0607  06/26/19 0853 06/27/19 0507 06/28/19 0451  AST 21 59*  --   --   --   --   ALT 15 18  --   --   --   --   ALKPHOS 81 67  --   --   --   --   BILITOT 1.1 1.1  --   --   --   --   PROT 7.2 6.3*  --   --   --   --   ALBUMIN 3.4* 3.2*   < > 3.4* 3.2* 3.2*   < > = values in this interval not displayed.   No results for input(s): LIPASE, AMYLASE in the last 168 hours. CBC: Recent Labs  Lab 06/23/19 0607 06/24/19 0502 06/25/19 0517 06/26/19 0853 06/27/19 0507  WBC 11.8* 10.3 9.8 10.0 9.5  HGB 12.0* 12.3* 12.0* 13.1 11.7*  HCT 35.1* 36.1* 35.8* 37.7* 34.2*  MCV 92.1 93.3 92.5 90.8 92.7  PLT 228 237 244 299 301   Blood Culture    Component Value Date/Time   SDES BLOOD LEFT ANTECUBITAL 06/22/2019 1938   SDES BLOOD RIGHT HAND 06/22/2019 1938   SPECREQUEST  06/22/2019 1938    BOTTLES DRAWN AEROBIC ONLY Blood Culture adequate volume   SPECREQUEST  06/22/2019 1938    BOTTLES DRAWN AEROBIC ONLY Blood Culture adequate volume   CULT  06/22/2019 1938    NO GROWTH 5  DAYS Performed at [facility], [place] [address], [place], [place] [address]    CULT  06/22/2019 1938    NO GROWTH 5 DAYS Performed at [place] [address] [place], [place] [address]    REPTSTATUS 06/27/2019 FINAL 06/22/2019 1938   REPTSTATUS 06/27/2019 FINAL 06/22/2019 1938    Cardiac Enzymes: No results for input(s): CKTOTAL, CKMB, CKMBINDEX, TROPONINI in the last 168 hours. CBG: Recent Labs  Lab 06/22/19 1739  GLUCAP 119*   Iron Studies: No results for input(s): IRON, TIBC, TRANSFERRIN, FERRITIN in the last 72 hours. _2 @ Studies/Results: Vas US Renal Artery Duplex  Result Date: 06/27/2019 ABDOMINAL VISCERAL Indications: Hypertension High Risk Factors: Hypertension, hyperlipidemia. Performing Technologist: Abram Sander RVS Supporting Technologist: June Leap RDMS, RVT  Examination Guidelines: A complete evaluation includes B-mode imaging, spectral Doppler, color Doppler, and power Doppler as needed of all accessible portions of each vessel. Bilateral testing is considered  an integral part of a complete examination. Limited examinations for reoccurring indications may be performed as noted.  Duplex Findings: +--------------------+--------+--------+------+--------+ Mesenteric          PSV cm/sEDV cm/sPlaqueComments +--------------------+--------+--------+------+--------+ Aorta at SMA          101      24                  +--------------------+--------+--------+------+--------+ Celiac Artery Origin  146      33                  +--------------------+--------+--------+------+--------+ SMA Proximal          223      25                  +--------------------+--------+--------+------+--------+  +------------------+--------+--------+-------+ Right Renal ArteryPSV cm/sEDV cm/sComment +------------------+--------+--------+-------+ Origin               47      10           +------------------+--------+--------+-------+ Proximal              35      11           +------------------+--------+--------+-------+ Mid                  35      8            +------------------+--------+--------+-------+ Distal               26      8            +------------------+--------+--------+-------+ +-----------------+--------+--------+-------+ Left Renal ArteryPSV cm/sEDV cm/sComment +-----------------+--------+--------+-------+ Origin              43      8            +-----------------+--------+--------+-------+ Proximal            32      8            +-----------------+--------+--------+-------+ Mid                 27      8            +-----------------+--------+--------+-------+ Distal              26      8            +-----------------+--------+--------+-------+ +------------+--------+--------+----+-----------+--------+--------+----+ Right KidneyPSV cm/sEDV cm/sRI  Left KidneyPSV cm/sEDV cm/sRI   +------------+--------+--------+----+-----------+--------+--------+----+ Upper Pole  25      6       0.77Upper Pole 22      6       0.71 +------------+--------+--------+----+-----------+--------+--------+----+ Mid         26      7       0.73Mid        18      6       0.70 +------------+--------+--------+----+-----------+--------+--------+----+ Lower Pole  17      7       0.61Lower Pole 20      6       0.70 +------------+--------+--------+----+-----------+--------+--------+----+ Hilar       27      6       0.79Hilar      22      9       0.60 +------------+--------+--------+----+-----------+--------+--------+----+ +------------------+-----+------------------+----+ Right Kidney           Left  Kidney            +------------------+-----+------------------+----+ RAR                    RAR                    +------------------+-----+------------------+----+ RAR (manual)           RAR (manual)           +------------------+-----+------------------+----+ Cortex                  Cortex                 +------------------+-----+------------------+----+ Cortex thickness       Corex thickness        +------------------+-----+------------------+----+ Kidney length (cm)10.81Kidney length (cm)9.94 +------------------+-----+------------------+----+  Summary: Renal:  Right: No evidence of right renal artery stenosis. Abnormal right        Resistive Index. Left:  No evidence of left renal artery stenosis. Normal left        Resistive Index.  *See table(s) above for measurements and observations.  Diagnosing physician: Harold Barban MD  Electronically signed by Harold Barban MD on 06/27/2019 at 3:32:14 PM.    Final    Medications:  . aspirin  81 mg Oral Daily  . atorvastatin  40 mg Oral q1800  . carvedilol  6.25 mg Oral BID WC  . Chlorhexidine Gluconate Cloth  6 each Topical Daily  . diltiazem  120 mg Oral Q8H  . hydrALAZINE  50 mg Oral Q8H  . nicotine  14 mg Transdermal Daily  . potassium chloride  40 mEq Oral Q6H    Assessment/Plan: ** AKI:  Cr 1.12 on 12/2017 > 06/21/19 presentation 4.95 and now hovering in the mid 5s, today 5.37, eGFR 13.  Nonoliguric with 1.4L UOP yesterday.   06/23/19 renal US - 10.9 and 10.2cm, echogenic; wedge shaped hyperechoic areas BL = ddx infarcts vs pyelonephritis; No renal infarcts noted on CT a/p from 06/25/19 (noncontrast).  7/9 UA  +++ proteinuria, + glucose, mod blood, neg LE and nit. Microscopy with 0-5 RBC/hpf, no WBC.  UNa 7/9 <10.  UP/C not done yet- cancelled and reordered.   Suspect AKI due to hypertensive urgency/emergency.  I do not see an indication for renal biopsy.  AKI due to hypertension can be slow to improve and many times irreversible.  No indications for RRT at this time, will follow closely.    **  HTN emergency:  Dx with HTN several years ago, no consistent treatment.  MRI 7/9 with BL infarcts (acute in both hemispheres and brainstem), chronic small vessel changes, consider PRES. Adrenal glands normal on 06/25/19 CT.    Currently on hydralazine 50 TID, Diltiazem to 120 q8h, coreg 6.25 BID.  Increase coreg 12.5 BID (already had 6.25 this AM, added 1 time 6.31m dose).  No RAAS inhibitors now in setting of such low GFR and lack of consistent f/u in the past.   Renin, aldo levels pending.  Renal artery duplex neg for RAS. I think his BP is nearing a level that will be acceptable for discharge -- I think 150s for now and over the next weeks - month can titrate further to achieve optimal BP control of 130/80.   **Cocaine use: counseled for impact on health esp in setting of HTN.   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  TTE without vegetation.  Per cardiology note, they d/w neuro and  not thought to be embolic.  Plan for CIR post acute hospitalization.   **Hypokalemia:  Being aggressively repleted currently - per above renin and aldo pending.   **Dispo:  His renal function likely will be very slow to improve and may not improve substantially - he can f/u in clinic for that.  His BP appears to be improving and I think he's nearing a level that will be acceptable for discharge.    Jannifer Hick MD 06/28/2019, 8:09 AM  [facility] Pager: [phone]

## 2019-06-28 NOTE — Progress Notes (Signed)
   06/28/19 0654  Provider Notification  Provider Name/Title NP Chaney Malling  Date Provider Notified 06/28/19  Time Provider Notified [phone]  Notification Type Page  Notification Reason Other (Comment) (Potassium level 2.9)  Response See new orders  Date of Provider Response 06/28/19  Time of Provider Response [phone]

## 2019-06-28 NOTE — Progress Notes (Signed)
  Speech Language Pathology Treatment: Cognitive-Linquistic  Patient Details Name: William [name] MRN: [id number] DOB: 1985/12/30 Today's Date: 06/28/2019 Time: 2446-2863 SLP Time Calculation (min) (ACUTE ONLY): 25 min  Assessment / Plan / Recommendation Clinical Impression  Pt was seen for cognitive-linguistic treatment and was cooperative throughout the session. He was able recall some of the tasks which were completed yesterday. His awareness continues to improve as evidenced by his stating "Man! That stroke really took me out!" while having difficulty with a task and needing moderate cues. He achieved 50% accuracy with an executive function calendar activity increasing to 100% with mod-max cues. He was able to recall objective information from voice mails with 60% accuracy increasing to 100% with min-mod cues. He completed a 3-task sequencing mental manipulation activity with 40% accuracy increasing to 100% with min-mod cues. He achieved 60% accuracy with a working memory acticity increasing to 100% with min-mod cues.  SLP will contiune to follow pt.    HPI HPI: Pt is a 33 year old male admitted 7/9 with hypertensive crisis and started on nicardipine infusion. Hx of recent substance cocaine abuse. MRI of head revealed widespread abnormal findings including numerous scattered acute infarcts in both cerebral hemispheres and the brainstem, underlying chronic small vessel disease in the brain, including chronic microhemorrhages in the deep gray matter nuclei and brainstem, and corpus callosum and cerebral white matter disease which in some ways resembled demyelination.      SLP Plan  Continue with current plan of care       Recommendations                   Follow up Recommendations: Inpatient Rehab SLP Visit Diagnosis: Cognitive communication deficit (O17.711) Plan: Continue with current plan of care       [facility], [place], [place] Office  number [phone] Pager [place] 06/28/2019, 12:48 PM

## 2019-06-28 NOTE — Progress Notes (Signed)
Occupational Therapy Treatment Patient Details Name: William [name] MRN: [id number] DOB: 27-Mar-1986 Today's Date: 06/28/2019    History of present illness 33 year old male with a history of hypertension who initially presented with chest tightness, shortness of breath, and intermittent headaches.  Wife also noted decreased energy, sluggish appearing, and constipation. BP in ED 254/167. Admitted to ICU 06/22/19.  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 Pt with elevated troponin assessed by cardiology to be likely due to demand ischemia.    OT comments  Treatment session with focus on safety awareness, awareness of deficits, and Rt attention during self-care tasks of bathing and dressing. Pt required min guard for bed mobility and min-mod assist for ambulation without AD.  Pt bumping in to counter on Rt due to inattention during mobility.  Completed bathing at sit > stand level with min cues for safety, to don pants in sitting after attempting to complete in single leg stance with LOB with no awareness/attempt to modify task.  Pt will continue to benefit from skilled OT acutely to prepare for d/c to next venue of care.  Follow Up Recommendations  CIR;Supervision/Assistance - 24 hour    Equipment Recommendations  None recommended by OT    Recommendations for Other Services Rehab consult    Precautions / Restrictions Precautions Precautions: Fall;Other (comment) Precaution Comments: monitor BP Restrictions Weight Bearing Restrictions: No       Mobility Bed Mobility Overal bed mobility: Needs Assistance Bed Mobility: Supine to Sit;Sit to Supine     Supine to sit: Supervision Sit to supine: Supervision   General bed mobility comments: supervision for safety, somewhat impulsive  Transfers Overall transfer level: Needs  assistance Equipment used: None Transfers: Sit to/from Stand Sit to Stand: Min guard         General transfer comment: min guard for safety; pt able to stand without physical assistance    Balance Overall balance assessment: Needs assistance Sitting-balance support: Feet supported Sitting balance-Leahy Scale: Good     Standing balance support: During functional activity Standing balance-Leahy Scale: Poor                             ADL either performed or assessed with clinical judgement   ADL Overall ADL's : Needs assistance/impaired     Grooming: Wash/dry hands;Wash/dry face;Oral care;Min guard;Standing Grooming Details (indicate cue type and reason): improving attention, with ability to locate items in Rt visual field without cues Upper Body Bathing: Min guard;Sitting;Standing   Lower Body Bathing: Minimal assistance;Sit to/from stand   Upper Body Dressing : Minimal assistance;Sitting   Lower Body Dressing: Minimal assistance;Sit to/from stand Lower Body Dressing Details (indicate cue type and reason): pt donned pants this session with min cues due to decreased awareness after donning pants backwards. Toilet Transfer: Moderate assistance;Ambulation   Toileting- Clothing Manipulation and Hygiene: Minimal assistance;Sit to/from stand       Functional mobility during ADLs: Minimal assistance;Moderate assistance General ADL Comments: Rt inattention during mobility still persists.  Pt also continues with decreased safety awareness and awareness of errors.     Vision Baseline Vision/History: Wears glasses Patient Visual Report: No change from baseline            Cognition Arousal/Alertness: Awake/alert Behavior During Therapy: WFL for tasks assessed/performed Overall Cognitive Status: Impaired/Different from baseline Area of Impairment: Safety/judgement;Problem solving;Awareness  Orientation Level: Situation Current Attention  Level: Selective Memory: Decreased short-term memory Following Commands: Follows one step commands inconsistently;Follows one step commands with increased time Safety/Judgement: Decreased awareness of safety;Decreased awareness of deficits Awareness: Intellectual Problem Solving: Slow processing;Requires verbal cues                     Pertinent Vitals/ Pain       Pain Assessment: No/denies pain         Frequency  Min 2X/week        Progress Toward Goals  OT Goals(current goals can now be found in the care plan section)  Progress towards OT goals: Progressing toward goals  Acute Rehab OT Goals Patient Stated Goal: to get home OT Goal Formulation: With patient Time For Goal Achievement: 07/07/19 Potential to Achieve Goals: Good  Plan Discharge plan remains appropriate       AM-PAC OT "6 Clicks" Daily Activity     Outcome Measure   Help from another person eating meals?: A Little Help from another person taking care of personal grooming?: A Little Help from another person toileting, which includes using toliet, bedpan, or urinal?: A Lot Help from another person bathing (including washing, rinsing, drying)?: A Little Help from another person to put on and taking off regular upper body clothing?: A Little Help from another person to put on and taking off regular lower body clothing?: A Little 6 Click Score: 17    End of Session Equipment Utilized During Treatment: Gait belt  OT Visit Diagnosis: Unsteadiness on feet (R26.81);Cognitive communication deficit (R41.841);Dizziness and giddiness (R42) Symptoms and signs involving cognitive functions: Cerebral infarction   Activity Tolerance Patient tolerated treatment well   Patient Left in bed;with call bell/phone within reach;with bed alarm set   Nurse Communication Mobility status        Time: 0867-6195 OT Time Calculation (min): 26 min  Charges: OT General Charges $OT Visit: 1 Visit OT Treatments $Self  Care/Home Management : 23-37 mins    Simonne Come, [phone] 06/28/2019, 2:20 PM

## 2019-06-29 ENCOUNTER — Other Ambulatory Visit: Payer: Self-pay

## 2019-06-29 ENCOUNTER — Inpatient Hospital Stay (HOSPITAL_COMMUNITY)
Admission: RE | Admit: 2019-06-29 | Discharge: 2019-07-07 | DRG: 056 | Disposition: A | Discharge status: Other Acute Inpatient Hospital | Payer: Medicaid Other | Source: Intra-hospital | Attending: Physical Medicine & Rehabilitation | Admitting: Physical Medicine & Rehabilitation

## 2019-06-29 ENCOUNTER — Encounter (HOSPITAL_COMMUNITY): Payer: Self-pay | Admitting: Physical Medicine and Rehabilitation

## 2019-06-29 ENCOUNTER — Encounter (HOSPITAL_COMMUNITY): Payer: Self-pay

## 2019-06-29 DIAGNOSIS — F1910 Other psychoactive substance abuse, uncomplicated: Secondary | ICD-10-CM | POA: Diagnosis not present

## 2019-06-29 DIAGNOSIS — Z833 Family history of diabetes mellitus: Secondary | ICD-10-CM | POA: Diagnosis not present

## 2019-06-29 DIAGNOSIS — I6783 Posterior reversible encephalopathy syndrome: Secondary | ICD-10-CM | POA: Diagnosis present

## 2019-06-29 DIAGNOSIS — I313 Pericardial effusion (noninflammatory): Secondary | ICD-10-CM | POA: Diagnosis present

## 2019-06-29 DIAGNOSIS — I69319 Unspecified symptoms and signs involving cognitive functions following cerebral infarction: Secondary | ICD-10-CM | POA: Diagnosis not present

## 2019-06-29 DIAGNOSIS — F1410 Cocaine abuse, uncomplicated: Secondary | ICD-10-CM | POA: Diagnosis not present

## 2019-06-29 DIAGNOSIS — Z716 Tobacco abuse counseling: Secondary | ICD-10-CM | POA: Diagnosis not present

## 2019-06-29 DIAGNOSIS — I248 Other forms of acute ischemic heart disease: Secondary | ICD-10-CM | POA: Diagnosis not present

## 2019-06-29 DIAGNOSIS — F1310 Sedative, hypnotic or anxiolytic abuse, uncomplicated: Secondary | ICD-10-CM | POA: Diagnosis not present

## 2019-06-29 DIAGNOSIS — F1210 Cannabis abuse, uncomplicated: Secondary | ICD-10-CM | POA: Diagnosis not present

## 2019-06-29 DIAGNOSIS — I119 Hypertensive heart disease without heart failure: Secondary | ICD-10-CM | POA: Diagnosis present

## 2019-06-29 DIAGNOSIS — E785 Hyperlipidemia, unspecified: Secondary | ICD-10-CM | POA: Diagnosis present

## 2019-06-29 DIAGNOSIS — N179 Acute kidney failure, unspecified: Secondary | ICD-10-CM | POA: Diagnosis present

## 2019-06-29 DIAGNOSIS — F17210 Nicotine dependence, cigarettes, uncomplicated: Secondary | ICD-10-CM | POA: Diagnosis present

## 2019-06-29 DIAGNOSIS — Z7902 Long term (current) use of antithrombotics/antiplatelets: Secondary | ICD-10-CM | POA: Diagnosis not present

## 2019-06-29 DIAGNOSIS — I161 Hypertensive emergency: Secondary | ICD-10-CM | POA: Diagnosis not present

## 2019-06-29 DIAGNOSIS — Z7982 Long term (current) use of aspirin: Secondary | ICD-10-CM | POA: Diagnosis not present

## 2019-06-29 DIAGNOSIS — E871 Hypo-osmolality and hyponatremia: Secondary | ICD-10-CM | POA: Diagnosis not present

## 2019-06-29 DIAGNOSIS — R7989 Other specified abnormal findings of blood chemistry: Secondary | ICD-10-CM | POA: Diagnosis not present

## 2019-06-29 DIAGNOSIS — Z7151 Drug abuse counseling and surveillance of drug abuser: Secondary | ICD-10-CM

## 2019-06-29 DIAGNOSIS — I10 Essential (primary) hypertension: Secondary | ICD-10-CM | POA: Diagnosis not present

## 2019-06-29 DIAGNOSIS — I69354 Hemiplegia and hemiparesis following cerebral infarction affecting left non-dominant side: Secondary | ICD-10-CM | POA: Diagnosis present

## 2019-06-29 DIAGNOSIS — R41841 Cognitive communication deficit: Secondary | ICD-10-CM | POA: Diagnosis not present

## 2019-06-29 DIAGNOSIS — Z8673 Personal history of transient ischemic attack (TIA), and cerebral infarction without residual deficits: Secondary | ICD-10-CM | POA: Diagnosis not present

## 2019-06-29 DIAGNOSIS — G8194 Hemiplegia, unspecified affecting left nondominant side: Secondary | ICD-10-CM | POA: Diagnosis not present

## 2019-06-29 DIAGNOSIS — Z888 Allergy status to other drugs, medicaments and biological substances status: Secondary | ICD-10-CM | POA: Diagnosis not present

## 2019-06-29 DIAGNOSIS — E876 Hypokalemia: Secondary | ICD-10-CM | POA: Diagnosis present

## 2019-06-29 DIAGNOSIS — I6782 Cerebral ischemia: Secondary | ICD-10-CM | POA: Diagnosis not present

## 2019-06-29 DIAGNOSIS — M6281 Muscle weakness (generalized): Secondary | ICD-10-CM | POA: Diagnosis not present

## 2019-06-29 DIAGNOSIS — Z79899 Other long term (current) drug therapy: Secondary | ICD-10-CM | POA: Diagnosis not present

## 2019-06-29 LAB — RENAL FUNCTION PANEL
Albumin: 3.3 g/dL — ABNORMAL LOW (ref 3.5–5.0)
Anion gap: 11 (ref 5–15)
BUN: 29 mg/dL — ABNORMAL HIGH (ref 6–20)
CO2: 21 mmol/L — ABNORMAL LOW (ref 22–32)
Calcium: 9.1 mg/dL (ref 8.9–10.3)
Chloride: 104 mmol/L (ref 98–111)
Creatinine, Ser: 5.88 mg/dL — ABNORMAL HIGH (ref 0.61–1.24)
GFR calc Af Amer: 13 mL/min — ABNORMAL LOW (ref >60)
GFR calc non Af Amer: 11 mL/min — ABNORMAL LOW (ref >60)
Glucose, Bld: 108 mg/dL — ABNORMAL HIGH (ref 70–99)
Phosphorus: 3.3 mg/dL (ref 2.5–4.6)
Potassium: 3.7 mmol/L (ref 3.5–5.1)
Sodium: 136 mmol/L (ref 135–145)

## 2019-06-29 MED ORDER — CLOPIDOGREL BISULFATE 75 MG PO TABS: 75.0000 mg | ORAL_TABLET | Freq: Every day | ORAL | Status: DC

## 2019-06-29 MED ORDER — ATORVASTATIN CALCIUM 40 MG PO TABS: 40.0000 mg | ORAL_TABLET | Freq: Every day | ORAL | Status: AC

## 2019-06-29 MED ORDER — POLYETHYLENE GLYCOL 3350 17 G PO PACK
17.0000 g | PACK | Freq: Every day | ORAL | Status: DC | PRN
  Administered 2019-07-04: 17 g via ORAL
  Filled 2019-06-29: qty 1

## 2019-06-29 MED ORDER — CARVEDILOL 25 MG PO TABS: 25.0000 mg | ORAL_TABLET | Freq: Two times a day (BID) | ORAL | Status: DC

## 2019-06-29 MED ORDER — FLEET ENEMA 7-19 GM/118ML RE ENEM: 1.0000 | ENEMA | Freq: Once | RECTAL | Status: DC | PRN

## 2019-06-29 MED ORDER — CARVEDILOL 12.5 MG PO TABS
25.0000 mg | ORAL_TABLET | Freq: Two times a day (BID) | ORAL | Status: DC
  Administered 2019-06-29 (×2): 25 mg via ORAL
  Filled 2019-06-29 (×2): qty 2

## 2019-06-29 MED ORDER — GUAIFENESIN-DM 100-10 MG/5ML PO SYRP: 5.0000 mL | ORAL_SOLUTION | Freq: Four times a day (QID) | ORAL | Status: DC | PRN

## 2019-06-29 MED ORDER — ALUMINUM HYDROXIDE GEL 320 MG/5ML PO SUSP
10.0000 mL | Freq: Four times a day (QID) | ORAL | Status: DC | PRN
  Filled 2019-06-29: qty 30

## 2019-06-29 MED ORDER — CARVEDILOL 25 MG PO TABS
25.0000 mg | ORAL_TABLET | Freq: Two times a day (BID) | ORAL | Status: DC
  Administered 2019-06-29 – 2019-07-07 (×17): 25 mg via ORAL
  Filled 2019-06-29 (×17): qty 1

## 2019-06-29 MED ORDER — HEPARIN SODIUM (PORCINE) 5000 UNIT/ML IJ SOLN
5000.0000 [IU] | Freq: Three times a day (TID) | INTRAMUSCULAR | Status: DC
  Administered 2019-06-29 – 2019-07-07 (×24): 5000 [IU] via SUBCUTANEOUS
  Filled 2019-06-29 (×24): qty 1

## 2019-06-29 MED ORDER — SIMETHICONE 80 MG PO CHEW: 80.0000 mg | CHEWABLE_TABLET | Freq: Four times a day (QID) | ORAL | Status: DC | PRN

## 2019-06-29 MED ORDER — DILTIAZEM HCL 120 MG PO TABS: 120.0000 mg | ORAL_TABLET | Freq: Three times a day (TID) | ORAL | Status: DC

## 2019-06-29 MED ORDER — ASPIRIN 81 MG PO CHEW
81.0000 mg | CHEWABLE_TABLET | Freq: Every day | ORAL | Status: DC
  Administered 2019-06-30 – 2019-07-07 (×8): 81 mg via ORAL
  Filled 2019-06-29 (×8): qty 1

## 2019-06-29 MED ORDER — CARVEDILOL 25 MG PO TABS: 25.0000 mg | ORAL_TABLET | Freq: Two times a day (BID) | ORAL | Status: AC

## 2019-06-29 MED ORDER — ATORVASTATIN CALCIUM 40 MG PO TABS
40.0000 mg | ORAL_TABLET | Freq: Every day | ORAL | Status: DC
  Administered 2019-06-29 – 2019-07-07 (×9): 40 mg via ORAL
  Filled 2019-06-29 (×9): qty 1

## 2019-06-29 MED ORDER — ACETAMINOPHEN 325 MG PO TABS
325.0000 mg | ORAL_TABLET | ORAL | Status: DC | PRN
  Administered 2019-07-04: 650 mg via ORAL
  Filled 2019-06-29: qty 2

## 2019-06-29 MED ORDER — NICOTINE 14 MG/24HR TD PT24
14.0000 mg | MEDICATED_PATCH | Freq: Every day | TRANSDERMAL | Status: DC
  Administered 2019-06-30 – 2019-07-07 (×8): 14 mg via TRANSDERMAL
  Filled 2019-06-29 (×8): qty 1

## 2019-06-29 MED ORDER — ENOXAPARIN SODIUM 40 MG/0.4ML ~~LOC~~ SOLN: 40.0000 mg | SUBCUTANEOUS | Status: DC

## 2019-06-29 MED ORDER — DIPHENHYDRAMINE HCL 12.5 MG/5ML PO ELIX: 12.5000 mg | ORAL_SOLUTION | Freq: Four times a day (QID) | ORAL | Status: DC | PRN

## 2019-06-29 MED ORDER — PROCHLORPERAZINE EDISYLATE 10 MG/2ML IJ SOLN: 5.0000 mg | Freq: Four times a day (QID) | INTRAMUSCULAR | Status: DC | PRN

## 2019-06-29 MED ORDER — TRAZODONE HCL 50 MG PO TABS
25.0000 mg | ORAL_TABLET | Freq: Every evening | ORAL | Status: DC | PRN
  Administered 2019-07-04 – 2019-07-05 (×2): 50 mg via ORAL
  Filled 2019-06-29 (×3): qty 1

## 2019-06-29 MED ORDER — BISACODYL 10 MG RE SUPP: 10.0000 mg | Freq: Every day | RECTAL | Status: DC | PRN

## 2019-06-29 MED ORDER — HYDRALAZINE HCL 20 MG/ML IJ SOLN: 10.0000 mg | INTRAMUSCULAR | Status: DC | PRN

## 2019-06-29 MED ORDER — DILTIAZEM HCL 60 MG PO TABS
120.0000 mg | ORAL_TABLET | Freq: Three times a day (TID) | ORAL | Status: DC
  Administered 2019-06-29 – 2019-07-07 (×23): 120 mg via ORAL
  Filled 2019-06-29 (×24): qty 2

## 2019-06-29 MED ORDER — NICOTINE 14 MG/24HR TD PT24: 14.0000 mg | MEDICATED_PATCH | Freq: Every day | TRANSDERMAL | 0 refills | Status: DC

## 2019-06-29 MED ORDER — PROCHLORPERAZINE MALEATE 5 MG PO TABS: 5.0000 mg | ORAL_TABLET | Freq: Four times a day (QID) | ORAL | Status: DC | PRN

## 2019-06-29 MED ORDER — CLOPIDOGREL BISULFATE 75 MG PO TABS
75.0000 mg | ORAL_TABLET | Freq: Every day | ORAL | Status: DC
  Administered 2019-06-30 – 2019-07-07 (×8): 75 mg via ORAL
  Filled 2019-06-29 (×8): qty 1

## 2019-06-29 MED ORDER — PROCHLORPERAZINE 25 MG RE SUPP
12.5000 mg | Freq: Four times a day (QID) | RECTAL | Status: DC | PRN
  Filled 2019-06-29: qty 1

## 2019-06-29 MED ORDER — ALUMINUM HYDROXIDE GEL 320 MG/5ML PO SUSP
30.0000 mL | Freq: Four times a day (QID) | ORAL | Status: DC | PRN
  Filled 2019-06-29: qty 30

## 2019-06-29 MED ORDER — HYDRALAZINE HCL 50 MG PO TABS: 50.0000 mg | ORAL_TABLET | Freq: Three times a day (TID) | ORAL | Status: DC

## 2019-06-29 MED ORDER — CLOPIDOGREL BISULFATE 75 MG PO TABS
75.0000 mg | ORAL_TABLET | Freq: Every day | ORAL | Status: DC
  Administered 2019-06-29: 75 mg via ORAL
  Filled 2019-06-29: qty 1

## 2019-06-29 MED ORDER — HYDRALAZINE HCL 50 MG PO TABS
50.0000 mg | ORAL_TABLET | Freq: Three times a day (TID) | ORAL | Status: DC
  Administered 2019-06-29 – 2019-07-04 (×14): 50 mg via ORAL
  Filled 2019-06-29 (×15): qty 1

## 2019-06-29 MED ORDER — ASPIRIN 81 MG PO CHEW: 81.0000 mg | CHEWABLE_TABLET | Freq: Every day | ORAL | Status: DC

## 2019-06-29 NOTE — Discharge Summary (Signed)
Physician Discharge Summary  William [name] WVP:[id number] DOB: 1986-01-09 DOA: 06/21/2019  PCP: Patient, No Pcp Per  Admit date: 06/21/2019 Discharge date: 06/29/2019  Admitted From: Home Disposition:  CIR  Discharge Condition:Stable CODE STATUS:FULL Diet recommendation: Heart Healthy  Brief/Interim Summary:  William [name] a 33 y.o.male with history of cocaine abuse, hyperlipidemia, hypertension. Patient presented secondary to shortness of breath. He was found to have significantly elevated blood pressure with evidence of kidney failure concerning for hypertensive emergency. Patient was admitted to the ICU and placed on a Cardene drip. Patient had associated mental status changes and imaging suggests multiple infarcts with possible evidence of PRES. Neurology was on board and helping to manage. Cardiology was consulted as well to help manage hypertension in addition to elevated troponin.Nephrology following as well. His blood pressure is better today.  His blood pressure need to be closely monitored . He has been recommended to be discharged to CIR by PT. Patient is a stable for discharge to CIR. He will be followed by nephrology there.  Following problems were addressed during his hospitalization:  Hypertensive urgency: Blood pressure improved this morning.  He was initially started on Cardene drip.  Currently on oral antihypertensives: Cardizem, hydralazine, carvedilol.  Renin/aldosterone pending.  Ultrasound duplex did not show any renal artery stenosis.  Continue PRN IV antihypertensives.  Nephrology following.  Acute renal failure: Most likely secondary to chronic accelerated hypertension.  Patient having good urine output.  Creatinine still significantly elevated.  Nephrology following  Acute CVA: Imaging showed bilateral anterior/posterior circulation infarcts.  Also evidence of edema of the brainstem concerning for press versus osmotic demyelination.  Neurology was  following.  Echo showed ejection fraction of more than 65%, severe concentric left ventricular hypertrophy. LDL of 158.  Hemoglobin A1c of 4.9.  History of cocaine abuse.  Neurology recommended dual antiplatelet therapy for 3 weeks followed by aspirin alone.  PT/OT recommending CIR.  Elevated troponin: Secondary to supply demand ischemia.  Cardiology was consulted for initial plan for cardiac cath .Etiology thought to be secondary to significant hypertension, severe LVH.No plan for further intervention.  Hyperlipidemia: LDL of 158.  Continue Lipitor  Cocaine abuse: Counseled cessation.   Discharge Diagnoses:  Active Problems:   Hypertensive emergency   Acute renal failure (HCC)   Cerebral thrombosis with cerebral infarction    Discharge Instructions  Discharge Instructions    Diet - low sodium heart healthy   Complete by: As directed    Discharge instructions   Complete by: As directed    1) Check BMP in 2 days. 2) Continue current medications. 3) Follow up with nephrology in 2 weeks.   Increase activity slowly   Complete by: As directed      Allergies as of 06/29/2019      Reactions   Reglan [metoclopramide]    Develops akathisia      Medication List    STOP taking these medications   acetaminophen 500 MG tablet Commonly known as: Tylenol   cyclobenzaprine 5 MG tablet Commonly known as: FLEXERIL   ibuprofen 600 MG tablet Commonly known as: ADVIL   lisinopril 10 MG tablet Commonly known as: ZESTRIL   traMADol 50 MG tablet Commonly known as: ULTRAM     TAKE these medications   aspirin 81 MG chewable tablet Chew 1 tablet (81 mg total) by mouth daily. Start taking on: June 30, 2019   atorvastatin 40 MG tablet Commonly known as: LIPITOR Take 1 tablet (40 mg total) by mouth daily at 6 PM.  carvedilol 25 MG tablet Commonly known as: COREG Take 1 tablet (25 mg total) by mouth 2 (two) times daily with a meal.   clopidogrel 75 MG tablet Commonly known  as: PLAVIX Take 1 tablet (75 mg total) by mouth daily for 21 days.   diltiazem 120 MG tablet Commonly known as: CARDIZEM Take 1 tablet (120 mg total) by mouth every 8 (eight) hours.   hydrALAZINE 20 MG/ML injection Commonly known as: APRESOLINE Inject 0.5 mLs (10 mg total) into the vein every 4 (four) hours as needed.   hydrALAZINE 50 MG tablet Commonly known as: APRESOLINE Take 1 tablet (50 mg total) by mouth every 8 (eight) hours.   nicotine 14 mg/24hr patch Commonly known as: NICODERM CQ - dosed in mg/24 hours Place 1 patch (14 mg total) onto the skin daily. Start taking on: June 30, 2019      Follow-up Information    Minus Breeding, MD. Schedule an appointment as soon as possible for a visit in 1 week(s).   Specialty: Cardiology Contact information: [address] [place] [address] [phone]        Justin Mend, MD. Schedule an appointment as soon as possible for a visit in 2 week(s).   Specialty: Internal Medicine Contact information: [address] [place] [address] [phone]          Allergies  Allergen Reactions  . Reglan [Metoclopramide]     Develops akathisia    Consultations:  Nephrology, neurology, cardiology   Procedures/Studies: Ct Abdomen Wo Contrast  Result Date: 06/25/2019 CLINICAL DATA:  Renal failure. Renal infarcts suggested by ultrasound. EXAM: CT ABDOMEN WITHOUT CONTRAST TECHNIQUE: Multidetector CT imaging of the abdomen was performed following the standard protocol without IV contrast. COMPARISON:  Renal ultrasound June 23, 2019 FINDINGS: Lower chest: No acute abnormality. Hepatobiliary: No focal liver abnormality is seen. Question gallbladder wall thickening with small amount of pericholecystic fluid. Pancreas: Unremarkable. No pancreatic ductal dilatation or surrounding inflammatory changes. Spleen: Normal in size without focal abnormality. Adrenals/Urinary Tract: Adrenal glands are unremarkable. Kidneys are  without renal calculi, focal lesion, or hydronephrosis. Minimal bilateral perirenal fat stranding. Stomach/Bowel: Stomach is within normal limits. Appendix appears normal. No evidence of bowel wall thickening, distention, or inflammatory changes. Vascular/Lymphatic: No significant vascular findings are present. No enlarged abdominal or pelvic lymph nodes. Other: No abdominal wall hernia or abnormality. Musculoskeletal: No acute or significant osseous findings. IMPRESSION: 1. Question gallbladder wall thickening with small amount of pericholecystic fluid. Further evaluation with right upper quadrant ultrasound may be considered. 2. Minimal bilateral perirenal fat stranding, nonspecific. Electronically Signed   By: Fidela Salisbury M.D.   On: 06/25/2019 19:48   Dg Chest 2 View  Result Date: 06/21/2019 CLINICAL DATA:  Dyspnea for 3 days, smoker EXAM: CHEST - 2 VIEW COMPARISON:  None. FINDINGS: Normal heart size. Normal mediastinal contour. No pneumothorax. No pleural effusion. Lungs appear clear, with no acute consolidative airspace disease and no pulmonary edema. IMPRESSION: No active cardiopulmonary disease. Electronically Signed   By: Ilona Sorrel M.D.   On: 06/21/2019 17:21   Ct Head Wo Contrast  Result Date: 06/22/2019 CLINICAL DATA:  Headache. Hypertensive emergency. Significantly elevated blood pressure. EXAM: CT HEAD WITHOUT CONTRAST TECHNIQUE: Contiguous axial images were obtained from the base of the skull through the vertex without intravenous contrast. COMPARISON:  None. FINDINGS: Brain: No evidence of acute infarction, hemorrhage, hydrocephalus, extra-axial collection or mass lesion/mass effect. Patchy areas of low-density in the bilateral caudate, periventricular white matter, and  right thalamus are nonspecific. Vascular: No hyperdense vessel or unexpected calcification. Skull: No fracture or focal lesion. Sinuses/Orbits: Small mucous retention cyst in both maxillary sinuses. The paranasal  sinuses and mastoid air cells are otherwise clear. The visualized orbits are unremarkable. Other: None. IMPRESSION: 1. Patchy areas of low-density in the bilateral caudate, periventricular white matter, and right thalamus are nonspecific and may represent chronic microvascular ischemic changes,, however would be unusual for patient age. Demyelinating disease such as multiple sclerosis also considered. Consider MRI for further evaluation. 2. No acute hemorrhage. Electronically Signed   By: Keith Rake M.D.   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 differential considerations including posterior reversible encephalopathy syndrome (PRES), osmotic demyelination, ischemic or embolic infarcts. EXAM: MRA NECK WITHOUT CONTRAST MRA HEAD WITHOUT CONTRAST TECHNIQUE: Angiographic images of the neck were obtained using MRA technique without intravenous contast. Angiographic images of the Circle of Willis were obtained using MRA technique without intravenous contrast. COMPARISON:  Brain MRI 06/22/2019. FINDINGS: MRA NECK FINDINGS Time-of-flight neck MRA images reveal antegrade flow in both cervical carotid and vertebral arteries throughout the neck and to the skull base. The left vertebral might arise directly from the arch (series 4, image 184) and is mildly dominant in the neck. The carotid bifurcations appear normal. No carotid or vertebral artery stenosis identified in the neck. MRA HEAD FINDINGS No intracranial mass effect or ventriculomegaly. Antegrade flow in the posterior circulation with dominant left vertebral artery. The right vertebral is patent to the vertebrobasilar junction, but is diminutive beyond the PICA. Both PICA origins are patent. Patent basilar artery without stenosis. Normal SCA origins. Fetal type PCA origins, more so the right.  Normal posterior communicating arteries. Bilateral PCA branches appear normal. Antegrade flow in both ICA siphons. No siphon stenosis. Normal ophthalmic and posterior communicating artery origins. Patent carotid termini. Normal MCA and ACA origins. The left A1 is dominant. Anterior communicating artery appears fenestrated (normal variant). Visible bilateral ACA branches appear normal. Left MCA M1 segment, bifurcation and visible left MCA branches are within normal limits. Right MCA M1, bifurcation, and visible right MCA branches appear normal. IMPRESSION: Negative MRA of the head and neck. Electronically Signed   By: Genevie Ann M.D.   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 differential considerations including posterior reversible encephalopathy syndrome (PRES), osmotic demyelination, ischemic or embolic infarcts. EXAM: MRA NECK WITHOUT CONTRAST MRA HEAD WITHOUT CONTRAST TECHNIQUE: Angiographic images of the neck were obtained using MRA technique without intravenous contast. Angiographic images of the Circle of Willis were obtained using MRA technique without intravenous contrast. COMPARISON:  Brain MRI 06/22/2019. FINDINGS: MRA NECK FINDINGS Time-of-flight neck MRA images reveal antegrade flow in both cervical carotid and vertebral arteries throughout the neck and to the skull base. The left vertebral might arise directly from the arch (series 4, image 184) and is mildly dominant in the neck. The carotid bifurcations appear normal. No carotid or vertebral artery stenosis identified in the neck. MRA HEAD FINDINGS No intracranial mass effect or ventriculomegaly. Antegrade flow in the posterior circulation with dominant left vertebral artery. The right vertebral is patent to the vertebrobasilar junction, but is diminutive beyond the PICA. Both PICA  origins are patent. Patent basilar artery  without stenosis. Normal SCA origins. Fetal type PCA origins, more so the right. Normal posterior communicating arteries. Bilateral PCA branches appear normal. Antegrade flow in both ICA siphons. No siphon stenosis. Normal ophthalmic and posterior communicating artery origins. Patent carotid termini. Normal MCA and ACA origins. The left A1 is dominant. Anterior communicating artery appears fenestrated (normal variant). Visible bilateral ACA branches appear normal. Left MCA M1 segment, bifurcation and visible left MCA branches are within normal limits. Right MCA M1, bifurcation, and visible right MCA branches appear normal. IMPRESSION: Negative MRA of the head and neck. Electronically Signed   By: Genevie Ann M.D.   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 Headache. Abnormal CT appearance of the brain earlier today. EXAM: MRI HEAD WITHOUT CONTRAST TECHNIQUE: Multiplanar, multiecho pulse sequences of the brain and surrounding structures were obtained without intravenous contrast. COMPARISON:  Head CT 0209 hours today. FINDINGS: Brain: There are fairly numerous mostly small areas of restricted diffusion scattered in the brainstem and bilateral hemispheres. Among the largest areas is that in the medial left periatrial white matter near the splenium of the corpus callosum on series 2, image 26. There is involvement of the bilateral deep white matter capsules. There are a few scattered areas of small cortical involvement. There is scattered subcortical white matter involvement. And there is globular restricted diffusion in multiple areas of the pons and the pontomedullary junction (series 2, images 12-15). Superimposed on the abnormal pontine diffusion is diffuse pontomedullary T2 and FLAIR hyperintensity with expansion and generally facilitated diffusion. And there is a similar abnormal  appearance of the medial thalami (series 5, image 14). Extensive superimposed bilateral T2 and FLAIR hyperintensity in the basal ganglia appears to represent a combination of acute and chronic abnormality, with small areas of cystic encephalomalacia. There is no posterior circulation predominant cortical gray and white matter involvement typical of posterior reversible encephalopathy syndrome (PRES). Questionable subtle acute involvement of the cerebellum. There is also a tiny chronic right cerebellar infarct on series 5, image 8. There are superimposed chronic microhemorrhages in the bilateral deep gray matter nuclei and pons. Also, there is volume loss and confluent abnormal T2 and FLAIR hyperintensity in the body of the corpus callosum (series 11, image 111. Additionally, bilateral periventricular white matter T2 and FLAIR hyperintensity is somewhat nodular and oriented perpendicular to the lateral ventricles (such as on series 11, image 136). No contrast administered. No midline shift, mass effect, evidence of mass lesion, ventriculomegaly, extra-axial collection or acute intracranial hemorrhage. Cervicomedullary junction and pituitary are within normal limits. Vascular: Major intracranial vascular flow voids are preserved, the left vertebral artery appears dominant. Skull and upper cervical spine: Negative visible cervical spine. Visualized bone marrow signal is within normal limits. Sinuses/Orbits: Negative orbits. Mild paranasal sinus mucosal thickening. Other: Mastoids remain clear. Scalp and face soft tissues appear negative. IMPRESSION: 1. Widespread abnormal findings in the brain. 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 - severe T2 hyperintensity and expansion of the pons, medulla, and the medial thalami. 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 -  superimposed corpus callosum and cerebral white matter disease which in some ways resembles demyelination. - no associated acute hemorrhage. No significant intracranial mass effect at this time. 2. Although some of the typical features are absent, Severe Posterior Reversible Encephalopathy Syndrome (PRES) should  be considered, and could also explain the scattered acute infarcts. Other differential considerations would include more than one acute disease process, such as Osmotic Demyelination combined with Acute Emboli. Acute infectious encephalitis also is less likely. 3. Recommend Neurology consultation. Electronically Signed   By: Genevie Ann M.D.   On: 06/22/2019 14:10   US Renal  Result Date: 06/23/2019 CLINICAL DATA:  Acute renal failure EXAM: RENAL / URINARY TRACT ULTRASOUND COMPLETE COMPARISON:  None. FINDINGS: Right Kidney: Renal measurements: 10.9 x 4.8 x 4.9 cm = volume: 137 mL. There is increased cortical echogenicity. There is no hydronephrosis. There are few hyperacute 0 ache areas within the upper pole and lower pole measuring up to approximately 2 cm Left Kidney: Renal measurements: 10.2 x 5 x 5.2 cm = volume: 136.8 mL. The echogenicity is increased. There is no hydronephrosis. There is a small hyperechoic area in the lower pole. Bladder: Appears normal for degree of bladder distention. IMPRESSION: 1. No hydronephrosis 2. Echogenic kidneys bilaterally which can be seen in patients with medical renal disease. 3. Wedge-shaped hyperechoic areas involving both kidneys. Differential considerations include bilateral renal infarcts or bilateral pyelonephritis. A renal neoplasm or renal laceration seems less likely. Follow-up is recommended to confirm resolution of the sonographic findings. Electronically Signed   By: Constance Holster M.D.   On: 06/23/2019 03:27   Vas US Renal Artery Duplex  Result Date: 06/27/2019 ABDOMINAL VISCERAL Indications: Hypertension High Risk Factors: Hypertension,  hyperlipidemia. Performing Technologist: Abram Sander RVS Supporting Technologist: June Leap RDMS, RVT  Examination Guidelines: A complete evaluation includes B-mode imaging, spectral Doppler, color Doppler, and power Doppler as needed of all accessible portions of each vessel. Bilateral testing is considered an integral part of a complete examination. Limited examinations for reoccurring indications may be performed as noted.  Duplex Findings: +--------------------+--------+--------+------+--------+ Mesenteric          PSV cm/sEDV cm/sPlaqueComments +--------------------+--------+--------+------+--------+ Aorta at SMA          101      24                  +--------------------+--------+--------+------+--------+ Celiac Artery Origin  146      33                  +--------------------+--------+--------+------+--------+ SMA Proximal          223      25                  +--------------------+--------+--------+------+--------+  +------------------+--------+--------+-------+ Right Renal ArteryPSV cm/sEDV cm/sComment +------------------+--------+--------+-------+ Origin               47      10           +------------------+--------+--------+-------+ Proximal             35      11           +------------------+--------+--------+-------+ Mid                  35      8            +------------------+--------+--------+-------+ Distal               26      8            +------------------+--------+--------+-------+ +-----------------+--------+--------+-------+ Left Renal ArteryPSV cm/sEDV cm/sComment +-----------------+--------+--------+-------+ Origin              43      8            +-----------------+--------+--------+-------+  Proximal            32      8            +-----------------+--------+--------+-------+ Mid                 27      8            +-----------------+--------+--------+-------+ Distal              26      8             +-----------------+--------+--------+-------+ +------------+--------+--------+----+-----------+--------+--------+----+ Right KidneyPSV cm/sEDV cm/sRI  Left KidneyPSV cm/sEDV cm/sRI   +------------+--------+--------+----+-----------+--------+--------+----+ Upper Pole  25      6       0.77Upper Pole 22      6       0.71 +------------+--------+--------+----+-----------+--------+--------+----+ Mid         26      7       0.73Mid        18      6       0.70 +------------+--------+--------+----+-----------+--------+--------+----+ Lower Pole  17      7       0.61Lower Pole 20      6       0.70 +------------+--------+--------+----+-----------+--------+--------+----+ Hilar       27      6       0.79Hilar      22      9       0.60 +------------+--------+--------+----+-----------+--------+--------+----+ +------------------+-----+------------------+----+ Right Kidney           Left Kidney            +------------------+-----+------------------+----+ RAR                    RAR                    +------------------+-----+------------------+----+ RAR (manual)           RAR (manual)           +------------------+-----+------------------+----+ Cortex                 Cortex                 +------------------+-----+------------------+----+ Cortex thickness       Corex thickness        +------------------+-----+------------------+----+ Kidney length (cm)10.81Kidney length (cm)9.94 +------------------+-----+------------------+----+  Summary: Renal:  Right: No evidence of right renal artery stenosis. Abnormal right        Resistive Index. Left:  No evidence of left renal artery stenosis. Normal left        Resistive Index.  *See table(s) above for measurements and observations.  Diagnosing physician: Harold Barban MD  Electronically signed by Harold Barban MD on 06/27/2019 at 3:32:14 PM.    Final       Subjective:  Patient seen and examined at bedside this  morning.  Blood pressure better today.  Hemodynamically stable for discharge.  Discussed with nephrology who will continue to follow the patient CIR.  Discharge Exam: Vitals:   06/29/19 0800 06/29/19 1207  BP: (!) 163/115 (!) 158/96  Pulse:  79  Resp: 16 18  Temp: 97.8 F (36.6 C) 98 F (36.7 C)  SpO2:  99%   Vitals:   06/29/19 0349 06/29/19 0544 06/29/19 0800 06/29/19 1207  BP: (!) 179/117 (!) 171/113 (!) 163/115 (!) 158/96  Pulse: 76   79  Resp: 16  16 18  Temp: 98.3 F (36.8 C)  97.8 F (36.6 C) 98 F (36.7 C)  TempSrc: Oral  Oral Oral  SpO2: 99%   99%  Weight:      Height:        General: Pt is alert, awake, not in acute distress Cardiovascular: RRR, S1/S2 +, no rubs, no gallops Respiratory: CTA bilaterally, no wheezing, no rhonchi Abdominal: Soft, NT, ND, bowel sounds + Extremities: no edema, no cyanosis    The results of significant diagnostics from this hospitalization (including imaging, microbiology, ancillary and laboratory) are listed below for reference.     Microbiology: Recent Results (from the past 240 hour(s))  SARS Coronavirus 2 (CEPHEID - Performed in [place] hospital lab), Hosp Order     Status: None   Collection Time: 06/22/19 12:20 AM   Specimen: Nasopharyngeal Swab  Result Value Ref Range Status   SARS Coronavirus 2 NEGATIVE NEGATIVE Final    Comment: (NOTE) If result is NEGATIVE SARS-CoV-2 target nucleic acids are NOT DETECTED. The SARS-CoV-2 RNA is generally detectable in upper and lower  respiratory specimens during the acute phase of infection. The lowest  concentration of SARS-CoV-2 viral copies this assay can detect is 250  copies / mL. A negative result does not preclude SARS-CoV-2 infection  and should not be used as the sole basis for treatment or other  patient management decisions.  A negative result may occur with  improper specimen collection / handling, submission of specimen other  than nasopharyngeal swab, presence of  viral mutation(s) within the  areas targeted by this assay, and inadequate number of viral copies  (<250 copies / mL). A negative result must be combined with clinical  observations, patient history, and epidemiological information. If result is POSITIVE SARS-CoV-2 target nucleic acids are DETECTED. The SARS-CoV-2 RNA is generally detectable in upper and lower  respiratory specimens dur ing the acute phase of infection.  Positive  results are indicative of active infection with SARS-CoV-2.  Clinical  correlation with patient history and other diagnostic information is  necessary to determine patient infection status.  Positive results do  not rule out bacterial infection or co-infection with other viruses. If result is PRESUMPTIVE POSTIVE SARS-CoV-2 nucleic acids MAY BE PRESENT.   A presumptive positive result was obtained on the submitted specimen  and confirmed on repeat testing.  While 2019 novel coronavirus  (SARS-CoV-2) nucleic acids may be present in the submitted sample  additional confirmatory testing may be necessary for epidemiological  and / or clinical management purposes  to differentiate between  SARS-CoV-2 and other Sarbecovirus currently known to infect humans.  If clinically indicated additional testing with an alternate test  methodology [phone] is advised. The SARS-CoV-2 RNA is generally  detectable in upper and lower respiratory sp ecimens during the acute  phase of infection. The expected result is Negative. Fact Sheet for Patients:  StrictlyIdeas.no Fact Sheet for Healthcare Providers: BankingDealers.co.za This test is not yet approved or cleared by the [place] FDA and has been authorized for detection and/or diagnosis of SARS-CoV-2 by FDA under an Emergency Use Authorization (EUA).  This EUA will remain in effect (meaning this test can be used) for the duration of the COVID-19 declaration under Section  564(b)(1) of the Act, 21 U.S.C. section 360bbb-3(b)(1), unless the authorization is terminated or revoked sooner. Performed at [facility], [place] [address] [place], [place] [address]   MRSA PCR Screening     Status: None   Collection Time:  06/22/19  5:28 AM   Specimen: Nasal Mucosa; Nasopharyngeal  Result Value Ref Range Status   MRSA by PCR NEGATIVE NEGATIVE Final    Comment:        The GeneXpert MRSA Assay (FDA approved for NASAL specimens only), is one component of a comprehensive MRSA colonization surveillance program. It is not intended to diagnose MRSA infection nor to guide or monitor treatment for MRSA infections. Performed at [facility], [place] [address], [place], [place] [address]   Culture, blood (Routine X 2) w Reflex to ID Panel     Status: None   Collection Time: 06/22/19  7:38 PM   Specimen: BLOOD  Result Value Ref Range Status   Specimen Description BLOOD LEFT ANTECUBITAL  Final   Special Requests   Final    BOTTLES DRAWN AEROBIC ONLY Blood Culture adequate volume   Culture   Final    NO GROWTH 5 DAYS Performed at [facility], [place] [address], [place], [place] [address]    Report Status 06/27/2019 FINAL  Final  Culture, blood (Routine X 2) w Reflex to ID Panel     Status: None   Collection Time: 06/22/19  7:38 PM   Specimen: BLOOD RIGHT HAND  Result Value Ref Range Status   Specimen Description BLOOD RIGHT HAND  Final   Special Requests   Final    BOTTLES DRAWN AEROBIC ONLY Blood Culture adequate volume   Culture   Final    NO GROWTH 5 DAYS Performed at [facility], [place] [address] [place], [place] [address]    Report Status 06/27/2019 FINAL  Final     Labs: BNP (last 3 results) No results for input(s): BNP in the last 8760 hours. Basic Metabolic Panel: Recent Labs  Lab 06/22/19 1324  06/23/19 0607  06/25/19 0756 06/26/19 0853 06/27/19 0507 06/28/19 0451 06/29/19 0451  NA 134*  --  140   < > 135 139 138 135 136   K 3.2*  --  3.1*   < > 2.7* 3.0* 2.9* 2.9* 3.7  CL 97*  --  103   < > 97* 100 102 101 104  CO2 24  --  25   < > 25 27 25 23  21*  GLUCOSE 112*  --  106*   < > 133* 118* 112* 114* 108*  BUN 29*  --  26*   < > 25* 23* 23* 28* 29*  CREATININE 5.43*  --  5.36*   < > 5.21* 5.37* 5.15* 5.60* 5.88*  CALCIUM 9.1  --  9.1   < > 9.1 9.3 9.1 9.1 9.1  MG 2.3  --  2.2  --   --   --  1.8  --   --   PHOS  --    < > 2.8  --  3.5 3.0 3.9 4.3 3.3   < > = values in this interval not displayed.   Liver Function Tests: Recent Labs  Lab 06/23/19 0607 06/25/19 0756 06/26/19 0853 06/27/19 0507 06/28/19 0451 06/29/19 0451  AST 59*  --   --   --   --   --   ALT 18  --   --   --   --   --   ALKPHOS 67  --   --   --   --   --   BILITOT 1.1  --   --   --   --   --   PROT  6.3*  --   --   --   --   --   ALBUMIN 3.2* 3.3* 3.4* 3.2* 3.2* 3.3*   No results for input(s): LIPASE, AMYLASE in the last 168 hours. No results for input(s): AMMONIA in the last 168 hours. CBC: Recent Labs  Lab 06/23/19 0607 06/24/19 0502 06/25/19 0517 06/26/19 0853 06/27/19 0507  WBC 11.8* 10.3 9.8 10.0 9.5  HGB 12.0* 12.3* 12.0* 13.1 11.7*  HCT 35.1* 36.1* 35.8* 37.7* 34.2*  MCV 92.1 93.3 92.5 90.8 92.7  PLT 228 237 244 299 301   Cardiac Enzymes: No results for input(s): CKTOTAL, CKMB, CKMBINDEX, TROPONINI in the last 168 hours. BNP: Invalid input(s): POCBNP CBG: Recent Labs  Lab 06/22/19 1739  GLUCAP 119*   D-Dimer No results for input(s): DDIMER in the last 72 hours. Hgb A1c No results for input(s): HGBA1C in the last 72 hours. Lipid Profile No results for input(s): CHOL, HDL, LDLCALC, TRIG, CHOLHDL, LDLDIRECT in the last 72 hours. Thyroid function studies No results for input(s): TSH, T4TOTAL, T3FREE, THYROIDAB in the last 72 hours.  Invalid input(s): FREET3 Anemia work up No results for input(s): VITAMINB12, FOLATE, FERRITIN, TIBC, IRON, RETICCTPCT in the last 72 hours. Urinalysis    Component Value  Date/Time   COLORURINE YELLOW 06/22/2019 1800   APPEARANCEUR CLEAR 06/22/2019 1800   LABSPEC 1.012 06/22/2019 1800   PHURINE 5.0 06/22/2019 1800   GLUCOSEU 50 (A) 06/22/2019 1800   HGBUR MODERATE (A) 06/22/2019 1800   BILIRUBINUR NEGATIVE 06/22/2019 1800   KETONESUR NEGATIVE 06/22/2019 1800   PROTEINUR >=300 (A) 06/22/2019 1800   NITRITE NEGATIVE 06/22/2019 1800   LEUKOCYTESUR NEGATIVE 06/22/2019 1800   Sepsis Labs Invalid input(s): PROCALCITONIN,  WBC,  LACTICIDVEN Microbiology Recent Results (from the past 240 hour(s))  SARS Coronavirus 2 (CEPHEID - Performed in [facility] hospital lab), Hosp Order     Status: None   Collection Time: 06/22/19 12:20 AM   Specimen: Nasopharyngeal Swab  Result Value Ref Range Status   SARS Coronavirus 2 NEGATIVE NEGATIVE Final    Comment: (NOTE) If result is NEGATIVE SARS-CoV-2 target nucleic acids are NOT DETECTED. The SARS-CoV-2 RNA is generally detectable in upper and lower  respiratory specimens during the acute phase of infection. The lowest  concentration of SARS-CoV-2 viral copies this assay can detect is 250  copies / mL. A negative result does not preclude SARS-CoV-2 infection  and should not be used as the sole basis for treatment or other  patient management decisions.  A negative result may occur with  improper specimen collection / handling, submission of specimen other  than nasopharyngeal swab, presence of viral mutation(s) within the  areas targeted by this assay, and inadequate number of viral copies  (<250 copies / mL). A negative result must be combined with clinical  observations, patient history, and epidemiological information. If result is POSITIVE SARS-CoV-2 target nucleic acids are DETECTED. The SARS-CoV-2 RNA is generally detectable in upper and lower  respiratory specimens dur ing the acute phase of infection.  Positive  results are indicative of active infection with SARS-CoV-2.  Clinical  correlation with patient  history and other diagnostic information is  necessary to determine patient infection status.  Positive results do  not rule out bacterial infection or co-infection with other viruses. If result is PRESUMPTIVE POSTIVE SARS-CoV-2 nucleic acids MAY BE PRESENT.   A presumptive positive result was obtained on the submitted specimen  and confirmed on repeat testing.  While 2019 novel coronavirus  (SARS-CoV-2)  nucleic acids may be present in the submitted sample  additional confirmatory testing may be necessary for epidemiological  and / or clinical management purposes  to differentiate between  SARS-CoV-2 and other Sarbecovirus currently known to infect humans.  If clinically indicated additional testing with an alternate test  methodology [phone]) is advised. The SARS-CoV-2 RNA is generally  detectable in upper and lower respiratory sp ecimens during the acute  phase of infection. The expected result is Negative. Fact Sheet for Patients:  StrictlyIdeas.no Fact Sheet for Healthcare Providers: BankingDealers.co.za This test is not yet approved or cleared by the [place] FDA and has been authorized for detection and/or diagnosis of SARS-CoV-2 by FDA under an Emergency Use Authorization (EUA).  This EUA will remain in effect (meaning this test can be used) for the duration of the COVID-19 declaration under Section 564(b)(1) of the Act, 21 U.S.C. section 360bbb-3(b)(1), unless the authorization is terminated or revoked sooner. Performed at [facility], [place] [address], [place], [place] [address]   MRSA PCR Screening     Status: None   Collection Time: 06/22/19  5:28 AM   Specimen: Nasal Mucosa; Nasopharyngeal  Result Value Ref Range Status   MRSA by PCR NEGATIVE NEGATIVE Final    Comment:        The GeneXpert MRSA Assay (FDA approved for NASAL specimens only), is one component of a comprehensive MRSA colonization surveillance  program. It is not intended to diagnose MRSA infection nor to guide or monitor treatment for MRSA infections. Performed at [facility], [place] [address] [place], [place] [address]   Culture, blood (Routine X 2) w Reflex to ID Panel     Status: None   Collection Time: 06/22/19  7:38 PM   Specimen: BLOOD  Result Value Ref Range Status   Specimen Description BLOOD LEFT ANTECUBITAL  Final   Special Requests   Final    BOTTLES DRAWN AEROBIC ONLY Blood Culture adequate volume   Culture   Final    NO GROWTH 5 DAYS Performed at [facility], [place] [address] [place], [place] [address]    Report Status 06/27/2019 FINAL  Final  Culture, blood (Routine X 2) w Reflex to ID Panel     Status: None   Collection Time: 06/22/19  7:38 PM   Specimen: BLOOD RIGHT HAND  Result Value Ref Range Status   Specimen Description BLOOD RIGHT HAND  Final   Special Requests   Final    BOTTLES DRAWN AEROBIC ONLY Blood Culture adequate volume   Culture   Final    NO GROWTH 5 DAYS Performed at [facility], [place] [address], [place], [place] [address]    Report Status 06/27/2019 FINAL  Final    Please note: You were cared for by a hospitalist during your hospital stay. Once you are discharged, your primary care physician will handle any further medical issues. Please note that NO REFILLS for any discharge medications will be authorized once you are discharged, as it is imperative that you return to your primary care physician (or establish a relationship with a primary care physician if you do not have one) for your post hospital discharge needs so that they can reassess your need for medications and monitor your lab values.    Time coordinating discharge: 40 minutes  SIGNED:   Shelly Coss, MD  [facility] 06/29/2019, 12:53 PM Pager [phone]  If 7PM-7AM, please contact night-coverage [email or web address] Password [email or web address]

## 2019-06-29 NOTE — Progress Notes (Signed)
  Speech Language Pathology Treatment: Cognitive-Linquistic  Patient Details Name: William [name] MRN: [id number] DOB: 1986/09/25 Today's Date: 06/29/2019 Time: 0932-3557 SLP Time Calculation (min) (ACUTE ONLY): 25 min  Assessment / Plan / Recommendation Clinical Impression  Pt was seen for cognitive-linguistic treatment and participated well in the session. He reported that he is eager to go to rehab and believes that he has areas which need work. He demonstrated 60% accuracy with delayed recall of information from voice mails increasing to 80% accuracy with min. cues. He achieved 100% accuracy with a mental manipulation task. He achieved 50% accuracy with an executive function, menu activity increasing to 50% accuracy with mod-max cues. With 5-item immediate recall he demonstrated 60% accuracy increasing to 100% with min. cues. He continues to demonstrate difficulty with time management and today achieved 50% accuracy increasing to 100% with mod cues. Pt currently has discharge orders and he will benefit from continued SLP serviced upon discharge.    HPI HPI: Pt is a 33 year old male admitted 7/9 with hypertensive crisis and started on nicardipine infusion. Hx of recent substance cocaine abuse. MRI of head revealed widespread abnormal findings including numerous scattered acute infarcts in both cerebral hemispheres and the brainstem, underlying chronic small vessel disease in the brain, including chronic microhemorrhages in the deep gray matter nuclei and brainstem, and corpus callosum and cerebral white matter disease which in some ways resembled demyelination.      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 Hardin Negus, [place], [place] Office number  [phone] Pager [place] 06/29/2019, 4:57 PM

## 2019-06-29 NOTE — Progress Notes (Signed)
[facility] Progress Note   Subjective:  Feels well, no new complaints.  No uremic symptoms.  No edema.  About to work with PT.  Objective Vitals:   06/28/19 1927 06/28/19 2338 06/29/19 0349 06/29/19 0544  BP: (!) 168/116 (!) 168/115 (!) 179/117 (!) 171/113  Pulse: 82 80 76   Resp:  16 16   Temp: 98.3 F (36.8 C) 98 F (36.7 C) 98.3 F (36.8 C)   TempSrc: Oral Oral Oral   SpO2: 100% 100% 99%   Weight:      Height:       Physical Exam General: well appearing lying in bed Heart: RRR Lungs: clear to bases Abdomen: soft, nontender Extremities: no edema  Additional Objective Labs: Basic Metabolic Panel: Recent Labs  Lab 06/27/19 0507 06/28/19 0451 06/29/19 0451  NA 138 135 136  K 2.9* 2.9* 3.7  CL 102 101 104  CO2 25 23 21*  GLUCOSE 112* 114* 108*  BUN 23* 28* 29*  CREATININE 5.15* 5.60* 5.88*  CALCIUM 9.1 9.1 9.1  PHOS 3.9 4.3 3.3   Liver Function Tests: Recent Labs  Lab 06/23/19 0607  06/27/19 0507 06/28/19 0451 06/29/19 0451  AST 59*  --   --   --   --   ALT 18  --   --   --   --   ALKPHOS 67  --   --   --   --   BILITOT 1.1  --   --   --   --   PROT 6.3*  --   --   --   --   ALBUMIN 3.2*   < > 3.2* 3.2* 3.3*   < > = values in this interval not displayed.   No results for input(s): LIPASE, AMYLASE in the last 168 hours. CBC: Recent Labs  Lab 06/23/19 0607 06/24/19 0502 06/25/19 0517 06/26/19 0853 06/27/19 0507  WBC 11.8* 10.3 9.8 10.0 9.5  HGB 12.0* 12.3* 12.0* 13.1 11.7*  HCT 35.1* 36.1* 35.8* 37.7* 34.2*  MCV 92.1 93.3 92.5 90.8 92.7  PLT 228 237 244 299 301   Blood Culture    Component Value Date/Time   SDES BLOOD LEFT ANTECUBITAL 06/22/2019 1938   SDES BLOOD RIGHT HAND 06/22/2019 1938   SPECREQUEST  06/22/2019 1938    BOTTLES DRAWN AEROBIC ONLY Blood Culture adequate volume   SPECREQUEST  06/22/2019 1938    BOTTLES DRAWN AEROBIC ONLY Blood Culture adequate volume   CULT  06/22/2019 1938    NO GROWTH 5  DAYS Performed at [facility], [place] [address], [place], [place] [address]    CULT  06/22/2019 1938    NO GROWTH 5 DAYS Performed at [place] [address] [place], [place] [address]    REPTSTATUS 06/27/2019 FINAL 06/22/2019 1938   REPTSTATUS 06/27/2019 FINAL 06/22/2019 1938    Cardiac Enzymes: No results for input(s): CKTOTAL, CKMB, CKMBINDEX, TROPONINI in the last 168 hours. CBG: Recent Labs  Lab 06/22/19 1739  GLUCAP 119*   Iron Studies: No results for input(s): IRON, TIBC, TRANSFERRIN, FERRITIN in the last 72 hours. '@[email or web address]' @ Studies/Results: Vas US Renal Artery Duplex  Result Date: 06/27/2019 ABDOMINAL VISCERAL Indications: Hypertension High Risk Factors: Hypertension, hyperlipidemia. Performing Technologist: Abram Sander RVS Supporting Technologist: June Leap RDMS, RVT  Examination Guidelines: A complete evaluation includes B-mode imaging, spectral Doppler, color Doppler, and power Doppler as needed of all accessible portions of each vessel. Bilateral testing is considered an integral part of  a complete examination. Limited examinations for reoccurring indications may be performed as noted.  Duplex Findings: +--------------------+--------+--------+------+--------+ Mesenteric          PSV cm/sEDV cm/sPlaqueComments +--------------------+--------+--------+------+--------+ Aorta at SMA          101      24                  +--------------------+--------+--------+------+--------+ Celiac Artery Origin  146      33                  +--------------------+--------+--------+------+--------+ SMA Proximal          223      25                  +--------------------+--------+--------+------+--------+  +------------------+--------+--------+-------+ Right Renal ArteryPSV cm/sEDV cm/sComment +------------------+--------+--------+-------+ Origin               47      10           +------------------+--------+--------+-------+ Proximal              35      11           +------------------+--------+--------+-------+ Mid                  35      8            +------------------+--------+--------+-------+ Distal               26      8            +------------------+--------+--------+-------+ +-----------------+--------+--------+-------+ Left Renal ArteryPSV cm/sEDV cm/sComment +-----------------+--------+--------+-------+ Origin              43      8            +-----------------+--------+--------+-------+ Proximal            32      8            +-----------------+--------+--------+-------+ Mid                 27      8            +-----------------+--------+--------+-------+ Distal              26      8            +-----------------+--------+--------+-------+ +------------+--------+--------+----+-----------+--------+--------+----+ Right KidneyPSV cm/sEDV cm/sRI  Left KidneyPSV cm/sEDV cm/sRI   +------------+--------+--------+----+-----------+--------+--------+----+ Upper Pole  25      6       0.77Upper Pole 22      6       0.71 +------------+--------+--------+----+-----------+--------+--------+----+ Mid         26      7       0.73Mid        18      6       0.70 +------------+--------+--------+----+-----------+--------+--------+----+ Lower Pole  17      7       0.61Lower Pole 20      6       0.70 +------------+--------+--------+----+-----------+--------+--------+----+ Hilar       27      6       0.79Hilar      22      9       0.60 +------------+--------+--------+----+-----------+--------+--------+----+ +------------------+-----+------------------+----+ Right Kidney           Left Kidney            +------------------+-----+------------------+----+  RAR                    RAR                    +------------------+-----+------------------+----+ RAR (manual)           RAR (manual)           +------------------+-----+------------------+----+ Cortex                  Cortex                 +------------------+-----+------------------+----+ Cortex thickness       Corex thickness        +------------------+-----+------------------+----+ Kidney length (cm)10.81Kidney length (cm)9.94 +------------------+-----+------------------+----+  Summary: Renal:  Right: No evidence of right renal artery stenosis. Abnormal right        Resistive Index. Left:  No evidence of left renal artery stenosis. Normal left        Resistive Index.  *See table(s) above for measurements and observations.  Diagnosing physician: Harold Barban MD  Electronically signed by Harold Barban MD on 06/27/2019 at 3:32:14 PM.    Final    Medications:  . aspirin  81 mg Oral Daily  . atorvastatin  40 mg Oral q1800  . carvedilol  25 mg Oral BID WC  . diltiazem  120 mg Oral Q8H  . hydrALAZINE  50 mg Oral Q8H  . nicotine  14 mg Transdermal Daily    Assessment/Plan: ** AKI:  Cr 1.12 on 12/2017 > 06/21/19 presentation 4.95 and now hovering in the mid to upper 5s, today 5.88, eGFR 13.  Nonoliguric with 776m UOP yesterday.   06/23/19 renal [place]- 10.9 and 10.2cm, echogenic; wedge shaped hyperechoic areas BL = ddx infarcts vs pyelonephritis; No renal infarcts noted on CT a/p from 06/25/19 (noncontrast).  7/9 UA  +++ proteinuria, + glucose, mod blood, neg LE and nit. Microscopy with 0-5 RBC/hpf, no WBC.  UNa 7/9 <10.  UP/C 2.56 but I suspect this is related to the renal infarcts not to an underlying GN.    Suspect AKI due to hypertensive urgency/emergency.  I do not see an indication for renal biopsy.  AKI due to hypertension can be slow to improve and many times irreversible.  No indications for RRT at this time, will follow closely.    **  HTN emergency:  Dx with HTN several years ago, no consistent treatment.  MRI 7/9 with BL infarcts (acute in both hemispheres and brainstem), chronic small vessel changes, consider PRES. Adrenal glands normal on 06/25/19 CT.   Currently on hydralazine 50 TID, Diltiazem  to 120 q8h, coreg 12.5 BID.  Increase coreg 25 BID.  No RAAS inhibitors now in setting of such low GFR and lack of consistent f/u in the past.   Renin, aldo levels still pending.  Renal artery duplex neg for RAS.  I think a reasonable goal for BP is 150s for now and over the next weeks - month can titrate further to achieve optimal BP control of 130/80.   **Cocaine use: counseled for impact on health esp in setting of HTN.   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  TTE without vegetation.  Per cardiology note, they d/w neuro and not thought to be embolic.  Plan for CIR post acute hospitalization.   **Hypokalemia:  Improved with repletion. Per above renin and aldo pending.   **Dispo:  His renal function likely will be very  slow to improve and may not improve substantially - he can f/u in clinic for that.  His BP appears to be improving and I think he's nearing a level that will be acceptable for discharge. It appears a referral to CIR is being explored.  He recently lost his job around [place] coverage unfortunately.  Hopefully he can obtain assistance to obtain his medications.    Jannifer Hick MD 06/29/2019, 8:05 AM  [facility] Pager: [phone]

## 2019-06-29 NOTE — Progress Notes (Signed)
Meredith Staggers, MD  Physician  Physical Medicine and Rehabilitation  PMR Pre-admission  Signed  Date of Service:  06/29/2019 1:01 PM      Related encounter: ED to Hosp-Admission (Current) from 06/21/2019 in [facility]      Signed         Show:Clear all '[x]' Manual'[x]' Template'[]' Copied  Added by: '[x]' Cristina Gong, RN'[x]' Meredith Staggers, MD  '[]' Hover for details PMR Admission Coordinator Pre-Admission Assessment  Patient: William [name] is an 33 y.o., male MRN: [id number] DOB: 08/21/1986 Height: '5\' 9"'  (175.3 cm) Weight: 85.7 kg  Insurance Information  PRIMARY: uninsured        Lost job 02/2019 with [employer] due to [place]. 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 Patient does have a biological daughter, 68 year old. That lives with him and child;s STepMom , [name].   The "Data Collection Information Summary" for patients in Inpatient Rehabilitation Facilities with attached "[facility] Records" was provided and verbally reviewed with: N/A  Emergency Contact Information         Contact Information    Name Relation Home Work Mobile   Rammel,Shantal Spouse [phone]  [phone]      Current Medical History  Patient Admitting Diagnosis: PRES and multiple infarcts  History of Present Illness: 33 year old man presented on 7/8 with history of HTN previously on meds but noncompliant for over 6 months due to lack of PCP or medical insurance. Complaints of chest tightness and SOB for several days. Intermittent headaches. BP 254/167 on presentation. Creat 4.95. Head CT with no acute hemorrhage. Patient admitted to ICU for hypertensive emergency and Acute Kidney Injury. Treated with Cardene drip. Cariology  consulted as well to assist with management of HTN in addition to elevated troponin.   Neurology consulted with AMS and imaging suggested multiple infarcts with possible evidence of PRES.  Patient weaned form Cardene and now on oral meds of Cardizem, hydralazine and carvedilol. U/S duplex did not show any renal artery stensosis. PRN IV antihypertensives and Nephrology following. Acute renal failure secondary to chronic accelerated HTN. Good urine output . Creatinine remains significantly elevated today at 5.88. No uremic symptoms.Elevated troponin secondary to supply demand ischemia. Cardiac cath held for etiology thought secondary to significant HTN, severe LVH. NO plan for further intervention.   Imaging showed bilateral anterior/posterior circulation infarcts. Also evidence of PRES> Neurology consulted. Echo with EF 65 %, and sever concentric left ventricular hypertrophy. History of cocaine abuse. Reccommended dual platelet antiplatlet therapy for 3 weeks followed by ASA alone. Patient counseled on drug use.   Complete NIHSS TOTAL: 0  Patient's medical record from [facility]  has been reviewed by the rehabilitation admission coordinator and physician.  Past Medical History      Past Medical History:  Diagnosis Date  . Drug abuse, cocaine type ([place]) 06/23/2019  . Hyperlipidemia   . Hypertension     Family History   family history is not on file.  Prior Rehab/Hospitalizations Has the patient had prior rehab or hospitalizations prior to admission? Yes  Has the patient had major surgery during 100 days prior to admission? No              Current Medications  Current  Facility-Administered Medications:  .  aspirin chewable tablet 81 mg, 81 mg, Oral, Daily, Asencion Noble, MD, 81 mg at 06/29/19 0948 .  atorvastatin (LIPITOR) tablet 40 mg, 40 mg, Oral, q1800, Mariel Aloe, MD, 40 mg at 06/28/19 1758 .  carvedilol (COREG) tablet 25 mg, 25 mg, Oral, BID WC,  Justin Mend, MD, 25 mg at 06/29/19 0818 .  clopidogrel (PLAVIX) tablet 75 mg, 75 mg, Oral, Daily, Adhikari, Amrit, MD .  diltiazem (CARDIZEM) tablet 120 mg, 120 mg, Oral, Q8H, Justin Mend, MD, 120 mg at 06/29/19 0545 .  hydrALAZINE (APRESOLINE) injection 10 mg, 10 mg, Intravenous, Q4H PRN, Mannam, Praveen, MD, 10 mg at 06/28/19 0048 .  hydrALAZINE (APRESOLINE) tablet 50 mg, 50 mg, Oral, Q8H, Justin Mend, MD, 50 mg at 06/29/19 0545 .  nicotine (NICODERM CQ - dosed in mg/24 hours) patch 14 mg, 14 mg, Transdermal, Daily, Mariel Aloe, MD, 14 mg at 06/29/19 1610  Patients Current Diet:     Diet Order                  Diet - low sodium heart healthy         Diet Heart Room service appropriate? Yes; Fluid consistency: Thin  Diet effective now               Precautions / Restrictions Precautions Precautions: Fall, Other (comment) Precaution Comments: monitor BP Restrictions Weight Bearing Restrictions: No   Has the patient had 2 or more falls or a fall with injury in the past year? No  Prior Activity Level Community (5-7x/wk): Independent; driving; lost his job March due to [place]  Prior Functional Level Self Care: Did the patient need help bathing, dressing, using the toilet or eating? Independent  Indoor Mobility: Did the patient need assistance with walking from room to room (with or without device)? Independent  Stairs: Did the patient need assistance with internal or external stairs (with or without device)? Independent  Functional Cognition: Did the patient need help planning regular tasks such as shopping or remembering to take medications? Independent  Home Assistive Devices / Equipment Home Assistive Devices/Equipment: None  Prior Device Use: Indicate devices/aids used by the patient prior to current illness, exacerbation or injury? None of the above  Current Functional Level Cognition  Arousal/Alertness: Awake/alert Overall  Cognitive Status: Impaired/Different from baseline Current Attention Level: Selective Orientation Level: Oriented X4 Following Commands: Follows one step commands with increased time, Follows one step commands inconsistently Safety/Judgement: Decreased awareness of safety, Decreased awareness of deficits General Comments: pt has difficulty explaining visual deficits and needs commands repeated sometimes multiple times before following through despite responding "okay" Attention: Sustained Sustained Attention: Impaired Memory: Impaired Memory Impairment: Decreased short term memory, Decreased recall of new information Awareness: Impaired Problem Solving: Impaired Executive Function: Self Monitoring, Organizing, Initiating Organizing: Impaired Initiating: Impaired Self Monitoring: Impaired Behaviors: Restless Safety/Judgment: Impaired    Extremity Assessment (includes Sensation/Coordination)  Upper Extremity Assessment: Defer to OT evaluation RUE Deficits / Details: Difficult to accurately assess due to Rt inattention.  He demonstrates full AROM.  Initial strength testing reveals strength 3+/5; however, when bil. resistance provided, strength was slightly less than Lt to equal Lt (4/5-4+/5)  Lower Extremity Assessment: RLE deficits/detail, LLE deficits/detail RLE Deficits / Details: decreased ability to follow instructions, strength not vastly different on one side versus the other RLE Coordination: decreased gross motor, decreased fine motor LLE Deficits / Details: decreased ability to follow instructions, strength not vastly different  on one side versus the other LLE Coordination: decreased fine motor, decreased gross motor    ADLs  Overall ADL's : Needs assistance/impaired Eating/Feeding: Supervision/ safety, Set up, Sitting Grooming: Wash/dry hands, Wash/dry face, Oral care, Min guard, Standing Grooming Details (indicate cue type and reason): improving attention, with ability to  locate items in Rt visual field without cues Upper Body Bathing: Min guard, Sitting, Standing Lower Body Bathing: Minimal assistance, Sit to/from stand Upper Body Dressing : Minimal assistance, Sitting Lower Body Dressing: Minimal assistance, Sit to/from stand Lower Body Dressing Details (indicate cue type and reason): pt donned pants this session with min cues due to decreased awareness after donning pants backwards. Toilet Transfer: Moderate assistance, Ambulation Toileting- Clothing Manipulation and Hygiene: Minimal assistance, Sit to/from stand Functional mobility during ADLs: Minimal assistance, Moderate assistance General ADL Comments: Rt inattention during mobility still persists.  Pt also continues with decreased safety awareness and awareness of errors.    Mobility  Overal bed mobility: Modified Independent Bed Mobility: Supine to Sit, Sit to Supine Supine to sit: Supervision Sit to supine: Supervision General bed mobility comments: supervision for safety, somewhat impulsive    Transfers  Overall transfer level: Needs assistance Equipment used: None Transfers: Sit to/from Stand Sit to Stand: Min guard General transfer comment: min guard for safety; pt able to stand without physical assistance    Ambulation / Gait / Stairs / Wheelchair Mobility  Ambulation/Gait Ambulation/Gait assistance: Min assist, Mod assist Gait Distance (Feet): 200 Feet Assistive device: (assist at trunk with gait belt ) Gait Pattern/deviations: Step-through pattern, Decreased stride length, Scissoring, Staggering right, Staggering left, Narrow base of support General Gait Details: pt uncoordinated and with multiple LOB requiring assistance to recover; significant gait deviations with head turns, directional changes, turning, and changes to gait speed Gait velocity: reduced Gait velocity interpretation: <1.8 ft/sec, indicate of risk for recurrent falls    Posture / Balance Dynamic Sitting  Balance Sitting balance - Comments: requires close min guard assist  Balance Overall balance assessment: Needs assistance Sitting-balance support: Feet supported Sitting balance-Leahy Scale: Good Sitting balance - Comments: requires close min guard assist  Standing balance support: During functional activity Standing balance-Leahy Scale: Poor Standing balance comment: pt did not have strong reaction with dizziness to standing despite diastolic BP 808 on last ck    Special needs/care consideration BiPAP/CPAP  N/a CPM  N/a Continuous Drip IV  N/a Dialysis  N/a Life Vest  N/a Oxygen  N/a Special Bed  N/a Trach Size  N/a Wound Vac n/a Skin  intact Bowel mgmt:  LBM 7/15 continent Bladder mgmt:  continent Diabetic mgmt:  N/a Behavioral consideration initially with sitter but mentation issues resolved. Calm and cooperative Chemo/radiation  N/a   Previous Home Environment  Living Arrangements: (spouse with 39, 56 and 48 year old children)  Lives With: Spouse, Son, Daughter Available Help at Discharge: Family, Available 24 hours/day(wife) Type of Home: House Home Layout: One level Home Access: Stairs to enter Entrance Stairs-Rails: None Technical brewer of Steps: 2 Bathroom Shower/Tub: Public librarian, Industrial/product designer: Yes How Accessible: Accessible via walker [place]: No Additional Comments: clarified with wife  Discharge Living Setting Plans for Discharge Living Setting: Patient's home, Lives with (comment)(wife, 59, 20 and 7 year old children) Type of Home at Discharge: House Discharge Home Layout: One level Discharge Home Access: Stairs to enter Entrance Stairs-Rails: None Entrance Stairs-Number of Steps: 2 Discharge Bathroom Shower/Tub: Tub/shower unit, Curtain Discharge Bathroom Toilet: Standard Discharge Bathroom  Accessibility: Yes How Accessible: Accessible via walker Does the patient have any problems  obtaining your medications?: Yes (Describe)(lost job, no PCP or medical insurance)  Social/Family/Support Systems Patient Roles: Spouse, Parent Contact Information: wife, [name] Anticipated Caregiver: wife Anticipated Caregiver's Contact Information: [phone] Ability/Limitations of Caregiver: wife disabled but funtionally independent Caregiver Availability: 24/7 Discharge Plan Discussed with Primary Caregiver: Yes Is Caregiver In Agreement with Plan?: Yes Does Caregiver/Family have Issues with Lodging/Transportation while Pt is in Rehab?: No  Goals/Additional Needs Patient/Family Goal for Rehab: Mod I to supervision with PT, OT, and SLP Expected length of stay: ELOS 7 days Special Service Needs: NO PCP, no medical insurance Pt/Family Agrees to Admission and willing to participate: Yes Program Orientation Provided & Reviewed with Pt/Caregiver Including Roles  & Responsibilities: Yes  Decrease burden of Care through IP rehab admission: n/a  Possible need for SNF placement upon discharge:  N/a  Patient Condition: I have reviewed medical records from [facility] , spoken with CSW, and patient and spouse. I met with patient at the bedside for inpatient rehabilitation assessment.  Patient will benefit from ongoing PT, can actively participate in 3 hours of therapy a day 5 days of the week, and can make measurable gains during the admission.  Patient will also benefit from the coordinated team approach during an Inpatient Acute Rehabilitation admission.  The patient will receive intensive therapy as well as Rehabilitation physician, nursing, social worker, and care management interventions.  Due to bladder management, bowel management, safety, skin/wound care, disease management, medication administration, pain management and patient education the patient requires 24 hour a day rehabilitation nursing.  The patient is currently min assist with mobility and basic ADLs.  Discharge  setting and therapy post discharge at home with home health is anticipated.  Patient has agreed to participate in the Acute Inpatient Rehabilitation Program and will admit today.  Preadmission Screen Completed By:  Cleatrice Burke RN MSN 06/29/2019 1:01 PM ______________________________________________________________________   Discussed status with Dr. Naaman Plummer  on  06/29/2019 at  20 and received approval for admission today.  Admission Coordinator:  Cleatrice Burke, RN, time  1320 Date  1320   Assessment/Plan: Diagnosis: PRES, multiple cerebral infarcts 1. Does the need for close, 24 hr/day Medical supervision in concert with the patient's rehab needs make it unreasonable for this patient to be served in a less intensive setting? Yes 2. Co-Morbidities requiring supervision/potential complications: HTN, drug abuse 3. Due to bladder management, bowel management, safety, skin/wound care, disease management, medication administration, pain management and patient education, does the patient require 24 hr/day rehab nursing? Yes 4. Does the patient require coordinated care of a physician, rehab nurse, PT (1-2 hrs/day, 5 days/week), OT (1-2 hrs/day, 5 days/week) and SLP (1-2 hrs/day, 5 days/week) to address physical and functional deficits in the context of the above medical diagnosis(es)? Yes Addressing deficits in the following areas: balance, endurance, locomotion, strength, transferring, bowel/bladder control, bathing, dressing, feeding, grooming, toileting, cognition and psychosocial support 5. Can the patient actively participate in an intensive therapy program of at least 3 hrs of therapy 5 days a week? Yes 6. The potential for patient to make measurable gains while on inpatient rehab is excellent 7. Anticipated functional outcomes upon discharge from inpatients are: modified independent and supervision PT, modified independent and supervision OT, modified independent and  supervision SLP 8. Estimated rehab length of stay to reach the above functional goals is: 7 days 9. Anticipated D/C setting: Home 10. Anticipated post D/C treatments: [place]  therapy 11. Overall Rehab/Functional Prognosis: excellent  MD Signature: Meredith Staggers, MD, [place] 06/29/2019         Revision History

## 2019-06-29 NOTE — H&P (Signed)
Physical Medicine and Rehabilitation Admission H&P    Chief Complaint  Patient presents with  .  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  He reported having been out of BP meds x 6 months.  He was started on Cardene drip and UDS positive for benzos, THC and cocaine.  He was noted to have acute renal failure with BUN/SCR-25/4.95 and significantly elevated troponin of 6239.  2D echo done showing EF greater than 65% with severe concentric left ventricular hypertrophy and small pericardial effusion.  CT of head showed patchy areas low-density bilateral caudate periventricular white matter and right thalamic region.  MRI brain showed numerous scattered acute infarcts in both cerebral hemispheres and brainstem's, severe T2 hyperintensity and expansion of pons, medullary and medial thalami with superimposed corpus callosum and cerebral white matter disease--radiology question changes due to PRES.  CTA head/neck was negative for stenosis, aneurysm and showed fetal PCA origins greater on the right.  Dr. Percival Spanish was consulted for input and felt elevated troponin likely due to demand ischemia from hypertensive urgency, tachycardia and severe LVH.  They felt LVH likely due to longstanding uncontrolled hypertension. Troponin's peaked at 9,189 and cards felt likely portend to worsened prognosis.   Patient has had issues with disorientation as well as mild right-sided weakness.  Dr. 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  DAPT recommended x3 weeks followed by aspirin alone.  Nephrology consulted for input and felt that AKI  due to hypertensive emergency and would be slow to improve.  Renal ultrasound showed wedge-shaped hyperechoic areas bilaterally question due to infarcts versus pyelonephritis.  CT abdomen showed question of gallbladder wall thickening and minimal bilateral perirenal fat stranding.  Blood pressures have continued to be labile and renal artery ultrasound was negative for stenosis. Dr. Johnney Ou feels that blood pressures appear to be improving and reasonable SBP would be 150s with titration over the next few weeks to months to achieve optimal BP control.  He has been educated on cessation of cocaine on his overall health.  Therapy has been ongoing and patient continues to be impulsive with poor safety awareness, has cognitive deficits impacting executive functioning and memory has impairments in ability.  CIR recommended due to functional decline   Review of Systems  Constitutional: Negative for chills and fever.  HENT: Negative for hearing loss and tinnitus.   Eyes: Negative for blurred vision and double vision.  Respiratory: Negative for shortness of breath and wheezing.   Cardiovascular: Negative for chest pain and palpitations.  Gastrointestinal: Negative for abdominal pain, heartburn and nausea.  Musculoskeletal: Negative for myalgias and neck pain.  Skin: Negative for rash.  Neurological: Negative for dizziness, sensory change and headaches.  Psychiatric/Behavioral: Negative for depression. The patient is not nervous/anxious.      Past Medical History:  Diagnosis Date  . Drug abuse, cocaine type ([place]) 06/23/2019  . Hyperlipidemia   . Hypertension     Past Surgical History:  Procedure Laterality Date  . RADIOLOGY WITH ANESTHESIA N/A 06/23/2019   Procedure: MRI OF VESSELS;  Surgeon: Radiologist, Medication, MD;  Location: [facility];  Service: Radiology;  Laterality: N/A;    Family History  Problem Relation Age of Onset  . Diabetes Sister   . Diabetes Maternal Uncle      Social History:   Married--wife works from home. Used to work for [employer] off due to pandemic. He reports that he has been smoking. He has been smoking about 1.00 pack per day. He has never used smokeless tobacco. He reports current alcohol use. He reports current drug use. Drugs: Marijuana, Cocaine, and Benzodiazepines.    Allergies  Allergen Reactions  . Reglan [Metoclopramide]     Develops akathisia    Medications Prior to Admission  Medication Sig Dispense Refill  . acetaminophen (TYLENOL) 500 MG tablet Take 2 tablets (1,000 mg total) by mouth every 8 (eight) hours as needed. (Patient not taking: Reported on 06/21/2019) 30 tablet 0  . cyclobenzaprine (FLEXERIL) 5 MG tablet Take 1 tablet (5 mg total) by mouth 2 (two) times daily as needed for muscle spasms. (Patient not taking: Reported on 06/21/2019) 15 tablet 0  . ibuprofen (ADVIL,MOTRIN) 600 MG tablet Take 1 tablet (600 mg total) by mouth every 6 (six) hours as needed. (Patient not taking: Reported on 06/21/2019) 30 tablet 0  . lisinopril (PRINIVIL,ZESTRIL) 10 MG tablet Take 1 tablet (10 mg total) by mouth daily. (Patient not taking: Reported on 06/21/2019) 30 tablet 0  . traMADol (ULTRAM) 50 MG tablet Take 1 tablet (50 mg total) by mouth every 6 (six) hours as needed. (Patient not taking: Reported on 06/21/2019) 15 tablet 0    Drug Regimen Review  Drug regimen was reviewed and remains appropriate with no significant issues identified  Home: Home Living Family/patient expects to be discharged to:: Private residence Living Arrangements: (spouse with 25, 91 and 23 year old children) Available Help at Discharge: Family, Available 24 hours/day(wife) Type of Home: House Home Access: Stairs to enter Technical brewer of Steps: 2 Entrance Stairs-Rails: None Home Layout: One level Bathroom Shower/Tub: Tub/shower unit, Air cabin crew Accessibility: Yes Additional Comments: clarified with wife  Lives With: Spouse, Son,  Daughter   Functional History: Prior Function Level of Independence: Independent Comments: lost job March  Functional Status:  Mobility: Bed Mobility Overal bed mobility: Modified Independent Bed Mobility: Supine to Sit, Sit to Supine Supine to sit: Supervision Sit to supine: Supervision General bed mobility comments: supervision for safety, somewhat impulsive Transfers Overall transfer level: Needs assistance Equipment used: None Transfers: Sit to/from Stand Sit to Stand: Min guard General transfer comment: min guard for safety; pt able to stand without physical assistance Ambulation/Gait Ambulation/Gait assistance: Min assist, Mod assist Gait Distance (Feet): 200 Feet Assistive device: Straight cane(assist at trunk with gait belt ) Gait Pattern/deviations: Step-through pattern, Decreased stride length, Scissoring, Staggering right, Staggering left, Narrow base of support General Gait Details: pt continues to present with impaired coordination and with multiple LOB requiring assistance to recover; significant gait deviations with head turns, directional changes, turning, and changes to gait speed Gait velocity: reduced Gait velocity interpretation: <1.8 ft/sec, indicate of risk for recurrent falls    ADL: ADL Overall ADL's : Needs assistance/impaired Eating/Feeding: Supervision/ safety, Set up, Sitting Grooming: Wash/dry hands, Wash/dry face, Oral care, Min guard, Standing Grooming Details (indicate cue type and reason): improving attention, with ability to locate items in Rt visual field without cues Upper Body Bathing: Min guard, Sitting, Standing Lower Body Bathing: Minimal assistance, Sit to/from stand Upper Body Dressing : Minimal assistance, Sitting Lower Body Dressing: Minimal assistance, Sit to/from stand Lower Body Dressing Details (indicate cue type and reason): pt donned pants  this session with min cues due to decreased awareness after donning pants backwards.  Toilet Transfer: Moderate assistance, Ambulation Toileting- Clothing Manipulation and Hygiene: Minimal assistance, Sit to/from stand Functional mobility during ADLs: Minimal assistance, Moderate assistance General ADL Comments: Rt inattention during mobility still persists.  Pt also continues with decreased safety awareness and awareness of errors.  Cognition: Cognition Overall Cognitive Status: Impaired/Different from baseline Arousal/Alertness: Awake/alert Orientation Level: Oriented X4 Attention: Sustained Sustained Attention: Impaired Memory: Impaired Memory Impairment: Decreased short term memory, Decreased recall of new information Awareness: Impaired Problem Solving: Impaired Executive Function: Self Monitoring, Organizing, Initiating Organizing: Impaired Initiating: Impaired Self Monitoring: Impaired Behaviors: Restless Safety/Judgment: Impaired Cognition Arousal/Alertness: Awake/alert Behavior During Therapy: WFL for tasks assessed/performed Overall Cognitive Status: Impaired/Different from baseline Area of Impairment: Safety/judgement, Problem solving, Following commands, Memory, Attention Orientation Level: Situation Current Attention Level: Selective Memory: Decreased short-term memory Following Commands: Follows one step commands with increased time, Follows one step commands inconsistently Safety/Judgement: Decreased awareness of safety, Decreased awareness of deficits Awareness: Intellectual Problem Solving: Slow processing, Requires verbal cues, Decreased initiation General Comments: pt has difficulty explaining visual deficits and needs commands repeated sometimes multiple times before following through despite responding "okay"   Blood pressure (!) 183/119, pulse 100, temperature 98 F (36.7 C), temperature source Oral, resp. rate 18, height 5\' 9"  (1.753 m), weight 85.7 kg, SpO2 99 %. Physical Exam  Nursing note and vitals reviewed. Constitutional: He is  oriented to person, place, and time. He appears well-developed and well-nourished.  Polite and appropriate.  HENT:  Head: Normocephalic and atraumatic.  Eyes: Pupils are equal, round, and reactive to light.  Neck: Normal range of motion. No thyromegaly present.  Cardiovascular: Normal rate and regular rhythm. Exam reveals no friction rub.  No murmur heard. Respiratory: Effort normal. No respiratory distress. He has no wheezes.  GI: Soft. He exhibits no distension. There is no abdominal tenderness.  Musculoskeletal:        General: No tenderness or edema.  Neurological: He is alert and oriented to person, place, and time. He displays normal reflexes. He exhibits normal muscle tone.  Pt with reasonable insight and awareness. No focal CN abnl. RUE 4+/5. LUE 4/5. B/L LE 4+/5. No focal sensory findings.   Psychiatric:  Pt pleasant and cooperative. A little anxious. Decreased concentration    Results for orders placed or performed during the hospital encounter of 06/21/19 (from the past 48 hour(s))  Renal function panel     Status: Abnormal   Collection Time: 06/28/19  4:51 AM  Result Value Ref Range   Sodium 135 135 - 145 mmol/L   Potassium 2.9 (L) 3.5 - 5.1 mmol/L   Chloride 101 98 - 111 mmol/L   CO2 23 22 - 32 mmol/L   Glucose, Bld 114 (H) 70 - 99 mg/dL   BUN 28 (H) 6 - 20 mg/dL   Creatinine, Ser 5.60 (H) 0.61 - 1.24 mg/dL   Calcium 9.1 8.9 - 10.3 mg/dL   Phosphorus 4.3 2.5 - 4.6 mg/dL   Albumin 3.2 (L) 3.5 - 5.0 g/dL   GFR calc non Af Amer 12 (L) >60 mL/min   GFR calc Af Amer 14 (L) >60 mL/min   Anion gap 11 5 - 15    Comment: Performed at [facility], [address], [place], [place] [address]  Protein / creatinine ratio, urine     Status: Abnormal   Collection Time: 06/28/19 10:38 AM  Result Value Ref Range   Creatinine, Urine  73.80 mg/dL   Total Protein, Urine 189 mg/dL    Comment: NO NORMAL RANGE ESTABLISHED FOR THIS TEST RESULTS CONFIRMED BY MANUAL DILUTION     Protein Creatinine Ratio 2.56 (H) 0.00 - 0.15 mg/mg[Cre]    Comment: Performed at [facility] [address], [place], [place] [address]  Renal function panel     Status: Abnormal   Collection Time: 06/29/19  4:51 AM  Result Value Ref Range   Sodium 136 135 - 145 mmol/L   Potassium 3.7 3.5 - 5.1 mmol/L   Chloride 104 98 - 111 mmol/L   CO2 21 (L) 22 - 32 mmol/L   Glucose, Bld 108 (H) 70 - 99 mg/dL   BUN 29 (H) 6 - 20 mg/dL   Creatinine, Ser 5.88 (H) 0.61 - 1.24 mg/dL   Calcium 9.1 8.9 - 10.3 mg/dL   Phosphorus 3.3 2.5 - 4.6 mg/dL   Albumin 3.3 (L) 3.5 - 5.0 g/dL   GFR calc non Af Amer 11 (L) >60 mL/min   GFR calc Af Amer 13 (L) >60 mL/min   Anion gap 11 5 - 15    Comment: Performed at [facility] [address] [place], [place] [address]   No results found.     Medical Problem List and Plan: 1.  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  Antithrombotics: -DVT/anticoagulation:  Pharmaceutical: Lovenox  -antiplatelet therapy: Continue ASA/Plavix (added 7/16) X 3 weeks followed by ASA alone.  3. Pain Management: Tylenol as needed 4. Mood: LCSW to follow for evaluation and support  -antipsychotic agents: N/A 5. Neuropsych: This patient is capable of making decisions on  own behalf. 6. Skin/Wound Care: Routine pressure relief measures 7. Fluids/Electrolytes/Nutrition: Monitor I's and O's.  Check lites in a.m. 8 . HTN: Monitor blood pressures 3 times daily.  Continue coreg, Cardizem, hydralazine  -bp control has been improving  -titrate regimen as needed. 9.  Acute kidney injury: Monitor with serial checks for recovery.  Serum creatinine rising up to 5.88 today--- nephrology will continue to monitor for for now and feels renal status may be slow to improve vs irreversible.  10. Persistent hypokalemia: Improved post supplementation. Renin/aldosterone levels pending.  11. Hx  of polysubstance abuse: counseling as appropriate       Bary Leriche, PA-C 06/29/2019

## 2019-06-29 NOTE — PMR Pre-admission (Signed)
PMR Admission Coordinator Pre-Admission Assessment  Patient: William [name] is an 33 y.o., male MRN: [id number] DOB: 1986/04/12 Height: '5\' 9"'  (175.3 cm) Weight: 85.7 kg  Insurance Information  PRIMARY: uninsured        Lost job 02/2019 with [employer] due to [place]. 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 Patient does have a biological daughter, 7 year old. That lives with him and child;s STepMom , [name].   The "Data Collection Information Summary" for patients in Inpatient Rehabilitation Facilities with attached "[place] Records" was provided and verbally reviewed with: N/A  Emergency Contact Information Contact Information    Name Relation Home Work Mobile   Linam,Shantal Spouse [phone]  [phone]      Current Medical History  Patient Admitting Diagnosis: PRES and multiple infarcts  History of Present Illness: 33 year old man presented on 7/8 with history of HTN previously on meds but noncompliant for over 6 months due to lack of PCP or medical insurance. Complaints of chest tightness and SOB for several days. Intermittent headaches. BP 254/167 on presentation. Creat 4.95. Head CT with no acute hemorrhage. Patient admitted to ICU for hypertensive emergency and Acute Kidney Injury. Treated with Cardene drip. Cariology consulted as well to assist with management of HTN in addition to elevated troponin.   Neurology consulted with AMS and imaging suggested multiple infarcts with possible evidence of PRES.  Patient weaned form Cardene and now on oral meds of Cardizem, hydralazine and carvedilol. U/S duplex did not show any renal artery stensosis. PRN IV antihypertensives and Nephrology following. Acute renal failure secondary to chronic accelerated  HTN. Good urine output . Creatinine remains significantly elevated today at 5.88. No uremic symptoms.Elevated troponin secondary to supply demand ischemia. Cardiac cath held for etiology thought secondary to significant HTN, severe LVH. NO plan for further intervention.   Imaging showed bilateral anterior/posterior circulation infarcts. Also evidence of PRES> Neurology consulted. Echo with EF 65 %, and sever concentric left ventricular hypertrophy. History of cocaine abuse. Reccommended dual platelet antiplatlet therapy for 3 weeks followed by ASA alone. Patient counseled on drug use.   Complete NIHSS TOTAL: 0  Patient's medical record from [facility]  has been reviewed by the rehabilitation admission coordinator and physician.  Past Medical History  Past Medical History:  Diagnosis Date  . Drug abuse, cocaine type ([place]) 06/23/2019  . Hyperlipidemia   . Hypertension     Family History   family history is not on file.  Prior Rehab/Hospitalizations Has the patient had prior rehab or hospitalizations prior to admission? Yes  Has the patient had major surgery during 100 days prior to admission? No   Current Medications  Current Facility-Administered Medications:  .  aspirin chewable tablet 81 mg, 81 mg, Oral, Daily, Asencion Noble, MD, 81 mg at 06/29/19 0948 .  atorvastatin (LIPITOR) tablet 40 mg, 40 mg, Oral, q1800, Mariel Aloe, MD, 40 mg at 06/28/19 1758 .  carvedilol (COREG) tablet 25 mg, 25 mg, Oral, BID WC, Justin Mend, MD, 25 mg at 06/29/19 0818 .  clopidogrel (PLAVIX) tablet 75 mg, 75 mg, Oral, Daily, Adhikari, Amrit, MD .  diltiazem (CARDIZEM) tablet 120 mg, 120 mg, Oral, Q8H, Justin Mend, MD, 120 mg at 06/29/19  0545 .  hydrALAZINE (APRESOLINE) injection 10 mg, 10 mg, Intravenous, Q4H PRN, Mannam, Praveen, MD, 10 mg at 06/28/19 0048 .  hydrALAZINE (APRESOLINE) tablet 50 mg, 50 mg, Oral, Q8H, Justin Mend, MD, 50 mg at 06/29/19 0545 .  nicotine  (NICODERM CQ - dosed in mg/24 hours) patch 14 mg, 14 mg, Transdermal, Daily, Mariel Aloe, MD, 14 mg at 06/29/19 1093  Patients Current Diet:  Diet Order            Diet - low sodium heart healthy        Diet Heart Room service appropriate? Yes; Fluid consistency: Thin  Diet effective now              Precautions / Restrictions Precautions Precautions: Fall, Other (comment) Precaution Comments: monitor BP Restrictions Weight Bearing Restrictions: No   Has the patient had 2 or more falls or a fall with injury in the past year? No  Prior Activity Level Community (5-7x/wk): Independent; driving; lost his job March due to COVID  Prior Functional Level Self Care: Did the patient need help bathing, dressing, using the toilet or eating? Independent  Indoor Mobility: Did the patient need assistance with walking from room to room (with or without device)? Independent  Stairs: Did the patient need assistance with internal or external stairs (with or without device)? Independent  Functional Cognition: Did the patient need help planning regular tasks such as shopping or remembering to take medications? Independent  Home Assistive Devices / Equipment Home Assistive Devices/Equipment: None  Prior Device Use: Indicate devices/aids used by the patient prior to current illness, exacerbation or injury? None of the above  Current Functional Level Cognition  Arousal/Alertness: Awake/alert Overall Cognitive Status: Impaired/Different from baseline Current Attention Level: Selective Orientation Level: Oriented X4 Following Commands: Follows one step commands with increased time, Follows one step commands inconsistently Safety/Judgement: Decreased awareness of safety, Decreased awareness of deficits General Comments: pt has difficulty explaining visual deficits and needs commands repeated sometimes multiple times before following through despite responding "okay" Attention:  Sustained Sustained Attention: Impaired Memory: Impaired Memory Impairment: Decreased short term memory, Decreased recall of new information Awareness: Impaired Problem Solving: Impaired Executive Function: Self Monitoring, Organizing, Initiating Organizing: Impaired Initiating: Impaired Self Monitoring: Impaired Behaviors: Restless Safety/Judgment: Impaired    Extremity Assessment (includes Sensation/Coordination)  Upper Extremity Assessment: Defer to OT evaluation RUE Deficits / Details: Difficult to accurately assess due to Rt inattention.  He demonstrates full AROM.  Initial strength testing reveals strength 3+/5; however, when bil. resistance provided, strength was slightly less than Lt to equal Lt (4/5-4+/5)  Lower Extremity Assessment: RLE deficits/detail, LLE deficits/detail RLE Deficits / Details: decreased ability to follow instructions, strength not vastly different on one side versus the other RLE Coordination: decreased gross motor, decreased fine motor LLE Deficits / Details: decreased ability to follow instructions, strength not vastly different on one side versus the other LLE Coordination: decreased fine motor, decreased gross motor    ADLs  Overall ADL's : Needs assistance/impaired Eating/Feeding: Supervision/ safety, Set up, Sitting Grooming: Wash/dry hands, Wash/dry face, Oral care, Min guard, Standing Grooming Details (indicate cue type and reason): improving attention, with ability to locate items in Rt visual field without cues Upper Body Bathing: Min guard, Sitting, Standing Lower Body Bathing: Minimal assistance, Sit to/from stand Upper Body Dressing : Minimal assistance, Sitting Lower Body Dressing: Minimal assistance, Sit to/from stand Lower Body Dressing Details (indicate cue type and reason): pt donned pants this session with min cues due  to decreased awareness after donning pants backwards. Toilet Transfer: Moderate assistance, Ambulation Toileting-  Clothing Manipulation and Hygiene: Minimal assistance, Sit to/from stand Functional mobility during ADLs: Minimal assistance, Moderate assistance General ADL Comments: Rt inattention during mobility still persists.  Pt also continues with decreased safety awareness and awareness of errors.    Mobility  Overal bed mobility: Modified Independent Bed Mobility: Supine to Sit, Sit to Supine Supine to sit: Supervision Sit to supine: Supervision General bed mobility comments: supervision for safety, somewhat impulsive    Transfers  Overall transfer level: Needs assistance Equipment used: None Transfers: Sit to/from Stand Sit to Stand: Min guard General transfer comment: min guard for safety; pt able to stand without physical assistance    Ambulation / Gait / Stairs / Wheelchair Mobility  Ambulation/Gait Ambulation/Gait assistance: Min assist, Mod assist Gait Distance (Feet): 200 Feet Assistive device: (assist at trunk with gait belt ) Gait Pattern/deviations: Step-through pattern, Decreased stride length, Scissoring, Staggering right, Staggering left, Narrow base of support General Gait Details: pt uncoordinated and with multiple LOB requiring assistance to recover; significant gait deviations with head turns, directional changes, turning, and changes to gait speed Gait velocity: reduced Gait velocity interpretation: <1.8 ft/sec, indicate of risk for recurrent falls    Posture / Balance Dynamic Sitting Balance Sitting balance - Comments: requires close min guard assist  Balance Overall balance assessment: Needs assistance Sitting-balance support: Feet supported Sitting balance-Leahy Scale: Good Sitting balance - Comments: requires close min guard assist  Standing balance support: During functional activity Standing balance-Leahy Scale: Poor Standing balance comment: pt did not have strong reaction with dizziness to standing despite diastolic BP 656 on last ck    Special needs/care  consideration BiPAP/CPAP  N/a CPM  N/a Continuous Drip IV  N/a Dialysis  N/a Life Vest  N/a Oxygen  N/a Special Bed  N/a Trach Size  N/a Wound Vac n/a Skin  intact Bowel mgmt:  LBM 7/15 continent Bladder mgmt:  continent Diabetic mgmt:  N/a Behavioral consideration initially with sitter but mentation issues resolved. Calm and cooperative Chemo/radiation  N/a   Previous Home Environment  Living Arrangements: (spouse with 48, 22 and 69 year old children)  Lives With: Spouse, Son, Daughter Available Help at Discharge: Family, Available 24 hours/day(wife) Type of Home: House Home Layout: One level Home Access: Stairs to enter Entrance Stairs-Rails: None Technical brewer of Steps: 2 Bathroom Shower/Tub: Public librarian, Industrial/product designer: Yes How Accessible: Accessible via walker [place]: No Additional Comments: clarified with wife  Discharge Living Setting Plans for Discharge Living Setting: Patient's home, Lives with (comment)(wife, 30, 72 and 40 year old children) Type of Home at Discharge: House Discharge Home Layout: One level Discharge Home Access: Stairs to enter Entrance Stairs-Rails: None Entrance Stairs-Number of Steps: 2 Discharge Bathroom Shower/Tub: Tub/shower unit, Curtain Discharge Bathroom Toilet: Standard Discharge Bathroom Accessibility: Yes How Accessible: Accessible via walker Does the patient have any problems obtaining your medications?: Yes (Describe)(lost job, no PCP or medical insurance)  Social/Family/Support Systems Patient Roles: Spouse, Parent Contact Information: wife, [name] Anticipated Caregiver: wife Anticipated Caregiver's Contact Information: [phone] Ability/Limitations of Caregiver: wife disabled but funtionally independent Caregiver Availability: 24/7 Discharge Plan Discussed with Primary Caregiver: Yes Is Caregiver In Agreement with Plan?: Yes Does Caregiver/Family have  Issues with Lodging/Transportation while Pt is in Rehab?: No  Goals/Additional Needs Patient/Family Goal for Rehab: Mod I to supervision with PT, OT, and SLP Expected length of stay: [facility]OS 7 days Special Service Needs: NO  PCP, no medical insurance Pt/Family Agrees to Admission and willing to participate: Yes Program Orientation Provided & Reviewed with Pt/Caregiver Including Roles  & Responsibilities: Yes  Decrease burden of Care through IP rehab admission: n/a  Possible need for SNF placement upon discharge:  N/a  Patient Condition: I have reviewed medical records from [facility] , spoken with CSW, and patient and spouse. I met with patient at the bedside for inpatient rehabilitation assessment.  Patient will benefit from ongoing PT, can actively participate in 3 hours of therapy a day 5 days of the week, and can make measurable gains during the admission.  Patient will also benefit from the coordinated team approach during an Inpatient Acute Rehabilitation admission.  The patient will receive intensive therapy as well as Rehabilitation physician, nursing, social worker, and care management interventions.  Due to bladder management, bowel management, safety, skin/wound care, disease management, medication administration, pain management and patient education the patient requires 24 hour a day rehabilitation nursing.  The patient is currently min assist with mobility and basic ADLs.  Discharge setting and therapy post discharge at home with home health is anticipated.  Patient has agreed to participate in the Acute Inpatient Rehabilitation Program and will admit today.  Preadmission Screen Completed By:  Cleatrice Burke RN MSN 06/29/2019 1:01 PM ______________________________________________________________________   Discussed status with Dr. Naaman Plummer  on  06/29/2019 at  6 and received approval for admission today.  Admission Coordinator:  Cleatrice Burke, RN, time  1320  Date  1320   Assessment/Plan: Diagnosis: PRES, multiple cerebral infarcts 1. Does the need for close, 24 hr/day Medical supervision in concert with the patient's rehab needs make it unreasonable for this patient to be served in a less intensive setting? Yes 2. Co-Morbidities requiring supervision/potential complications: HTN, drug abuse 3. Due to bladder management, bowel management, safety, skin/wound care, disease management, medication administration, pain management and patient education, does the patient require 24 hr/day rehab nursing? Yes 4. Does the patient require coordinated care of a physician, rehab nurse, PT (1-2 hrs/day, 5 days/week), OT (1-2 hrs/day, 5 days/week) and SLP (1-2 hrs/day, 5 days/week) to address physical and functional deficits in the context of the above medical diagnosis(es)? Yes Addressing deficits in the following areas: balance, endurance, locomotion, strength, transferring, bowel/bladder control, bathing, dressing, feeding, grooming, toileting, cognition and psychosocial support 5. Can the patient actively participate in an intensive therapy program of at least 3 hrs of therapy 5 days a week? Yes 6. The potential for patient to make measurable gains while on inpatient rehab is excellent 7. Anticipated functional outcomes upon discharge from inpatients are: modified independent and supervision PT, modified independent and supervision OT, modified independent and supervision SLP 8. Estimated rehab length of stay to reach the above functional goals is: 7 days 9. Anticipated D/C setting: Home 10. Anticipated post D/C treatments: [place] therapy 11. Overall Rehab/Functional Prognosis: excellent  MD Signature: Meredith Staggers, MD, [place] [facility] 06/29/2019

## 2019-06-29 NOTE — Progress Notes (Signed)
Inpatient Rehabilitation Admissions Coordinator  I met with patient at bedside and then contacted Dr. Tawanna Solo to clarify medical readiness to d//c to CIR today. All in agreement to d/c to CIR today. I will contact his wife, Gari Crown , to update her with plans.  Danne Baxter, RN, MSN Rehab Admissions Coordinator [phone] 06/29/2019 12:51 PM

## 2019-06-29 NOTE — TOC Transition Note (Signed)
Transition of Care [facility]) - CM/SW Discharge Note   Patient Details  Name: William [name] MRN: [id number] Date of Birth: [birth year]  Transition of Care [facility]) CM/SW Contact:  Pollie Friar, RN Phone Number: 06/29/2019, 12:47 PM   Clinical Narrative:    Pt is discharging to CIR today. CM is signing off.    Final next level of care: IP Rehab Facility Barriers to Discharge: Inadequate or no insurance, Barriers Unresolved (comment)   Patient Goals and CMS Choice        Discharge Placement                       Discharge Plan and Services                                     Social Determinants of Health (SDOH) Interventions     Readmission Risk Interventions No flowsheet data found.

## 2019-06-29 NOTE — Progress Notes (Signed)
Patient admitted to IP rehab during shift. Denies pain or discomfort. Bp noted to be elevated upon arrival. Patient informed of safety plan. Adria Devon, LPN

## 2019-06-29 NOTE — Progress Notes (Signed)
Physical Therapy Treatment Patient Details Name: William [name] MRN: [id number] DOB: 12-Jul-1986 Today's Date: 06/29/2019    History of Present Illness 33 year old male with a history of hypertension who initially presented with chest tightness, shortness of breath, and intermittent headaches.  Wife also noted decreased energy, sluggish appearing, and constipation. BP in ED 254/167. Admitted to ICU 06/22/19.  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 Pt with elevated troponin assessed by cardiology to be likely due to demand ischemia.     PT Comments    Patient seen for mobility progression. Pt continues to present with impaired balance and cognitive deficits increasing risk for falls. Continue to recommend CIR.    Follow Up Recommendations  CIR     Equipment Recommendations  Other (comment)(TBD)    Recommendations for Other Services       Precautions / Restrictions Precautions Precautions: Fall;Other (comment) Precaution Comments: monitor BP Restrictions Weight Bearing Restrictions: No    Mobility  Bed Mobility Overal bed mobility: Modified Independent                Transfers Overall transfer level: Needs assistance Equipment used: None Transfers: Sit to/from Stand Sit to Stand: Min guard         General transfer comment: min guard for safety; pt able to stand without physical assistance  Ambulation/Gait Ambulation/Gait assistance: Min assist;Mod assist Gait Distance (Feet): 200 Feet Assistive device: Straight cane(assist at trunk with gait belt ) Gait Pattern/deviations: Step-through pattern;Decreased stride length;Scissoring;Staggering right;Staggering left;Narrow base of support Gait velocity: reduced   General Gait Details: pt continues to present with impaired coordination and with multiple LOB  requiring assistance to recover; significant gait deviations with head turns, directional changes, turning, and changes to gait speed   Stairs             Wheelchair Mobility    Modified Rankin (Stroke Patients Only)       Balance Overall balance assessment: Needs assistance Sitting-balance support: Feet supported Sitting balance-Leahy Scale: Good     Standing balance support: During functional activity Standing balance-Leahy Scale: Poor                              Cognition Arousal/Alertness: Awake/alert Behavior During Therapy: WFL for tasks assessed/performed Overall Cognitive Status: Impaired/Different from baseline Area of Impairment: Safety/judgement;Problem solving;Following commands;Memory;Attention                   Current Attention Level: Selective Memory: Decreased short-term memory Following Commands: Follows one step commands with increased time;Follows one step commands inconsistently Safety/Judgement: Decreased awareness of safety;Decreased awareness of deficits   Problem Solving: Slow processing;Requires verbal cues;Decreased initiation General Comments: pt has difficulty explaining visual deficits and needs commands repeated sometimes multiple times before following through despite responding "okay"      Exercises      General Comments        Pertinent Vitals/Pain Pain Assessment: No/denies pain    Home Living   Living Arrangements: (spouse with 86, 31 and 57 year old children) Available Help at Discharge: Family;Available 24 hours/day(wife)   Home Access: Stairs to enter Entrance Stairs-Rails: None Home Layout: One level   Additional Comments: clarified with wife    Prior Function        Comments: lost job March   PT Goals (current goals can now  be found in the care plan section) Progress towards PT goals: Progressing toward goals    Frequency    Min 3X/week      PT Plan Current plan remains  appropriate    Co-evaluation              AM-PAC PT "6 Clicks" Mobility   Outcome Measure  Help needed turning from your back to your side while in a flat bed without using bedrails?: None Help needed moving from lying on your back to sitting on the side of a flat bed without using bedrails?: None Help needed moving to and from a bed to a chair (including a wheelchair)?: A Little Help needed standing up from a chair using your arms (e.g., wheelchair or bedside chair)?: A Little Help needed to walk in hospital room?: A Lot Help needed climbing 3-5 steps with a railing? : A Lot 6 Click Score: 18    End of Session Equipment Utilized During Treatment: Gait belt Activity Tolerance: Patient tolerated treatment well Patient left: with call bell/phone within reach;in chair;with chair alarm set Nurse Communication: Mobility status PT Visit Diagnosis: Unsteadiness on feet (R26.81);Other abnormalities of gait and mobility (R26.89);Muscle weakness (generalized) (M62.81);Ataxic gait (R26.0);Difficulty in walking, not elsewhere classified (R26.2);Other symptoms and signs involving the nervous system (R29.898);Dizziness and giddiness (R42)     Time: 9767-3419 PT Time Calculation (min) (ACUTE ONLY): 28 min  Charges:  $Gait Training: 23-37 mins                     Earney Navy, PTA Acute Rehabilitation Services Pager: [phone] Office: [phone]     Darliss Cheney 06/29/2019, 3:16 PM

## 2019-06-30 ENCOUNTER — Inpatient Hospital Stay (HOSPITAL_COMMUNITY): Payer: Self-pay | Admitting: Occupational Therapy

## 2019-06-30 ENCOUNTER — Inpatient Hospital Stay (HOSPITAL_COMMUNITY): Payer: Self-pay | Admitting: Physical Therapy

## 2019-06-30 ENCOUNTER — Inpatient Hospital Stay (HOSPITAL_COMMUNITY): Payer: Self-pay | Admitting: Speech Pathology

## 2019-06-30 DIAGNOSIS — I10 Essential (primary) hypertension: Secondary | ICD-10-CM

## 2019-06-30 DIAGNOSIS — I6783 Posterior reversible encephalopathy syndrome: Secondary | ICD-10-CM

## 2019-06-30 DIAGNOSIS — N179 Acute kidney failure, unspecified: Secondary | ICD-10-CM | POA: Diagnosis present

## 2019-06-30 DIAGNOSIS — F1910 Other psychoactive substance abuse, uncomplicated: Secondary | ICD-10-CM

## 2019-06-30 LAB — CBC WITH DIFFERENTIAL/PLATELET
Abs Immature Granulocytes: 0.04 10*3/uL (ref 0.00–0.07)
Basophils Absolute: 0.1 10*3/uL (ref 0.0–0.1)
Basophils Relative: 1 %
Eosinophils Absolute: 0.5 10*3/uL (ref 0.0–0.5)
Eosinophils Relative: 5 %
HCT: 32.6 % — ABNORMAL LOW (ref 39.0–52.0)
Hemoglobin: 10.9 g/dL — ABNORMAL LOW (ref 13.0–17.0)
Immature Granulocytes: 0 %
Lymphocytes Relative: 28 %
Lymphs Abs: 2.7 10*3/uL (ref 0.7–4.0)
MCH: 31.4 pg (ref 26.0–34.0)
MCHC: 33.4 g/dL (ref 30.0–36.0)
MCV: 93.9 fL (ref 80.0–100.0)
Monocytes Absolute: 0.8 10*3/uL (ref 0.1–1.0)
Monocytes Relative: 8 %
Neutro Abs: 5.5 10*3/uL (ref 1.7–7.7)
Neutrophils Relative %: 58 %
Platelets: 338 10*3/uL (ref 150–400)
RBC: 3.47 MIL/uL — ABNORMAL LOW (ref 4.22–5.81)
RDW: 12.3 % (ref 11.5–15.5)
WBC: 9.6 10*3/uL (ref 4.0–10.5)
nRBC: 0 % (ref 0.0–0.2)

## 2019-06-30 LAB — RENAL FUNCTION PANEL
Albumin: 3.2 g/dL — ABNORMAL LOW (ref 3.5–5.0)
Anion gap: 10 (ref 5–15)
BUN: 33 mg/dL — ABNORMAL HIGH (ref 6–20)
CO2: 23 mmol/L (ref 22–32)
Calcium: 9.3 mg/dL (ref 8.9–10.3)
Chloride: 102 mmol/L (ref 98–111)
Creatinine, Ser: 6.31 mg/dL — ABNORMAL HIGH (ref 0.61–1.24)
GFR calc Af Amer: 12 mL/min — ABNORMAL LOW (ref >60)
GFR calc non Af Amer: 11 mL/min — ABNORMAL LOW (ref >60)
Glucose, Bld: 99 mg/dL (ref 70–99)
Phosphorus: 3.7 mg/dL (ref 2.5–4.6)
Potassium: 3.3 mmol/L — ABNORMAL LOW (ref 3.5–5.1)
Sodium: 135 mmol/L (ref 135–145)

## 2019-06-30 MED ORDER — SPIRONOLACTONE 25 MG PO TABS
25.0000 mg | ORAL_TABLET | Freq: Every day | ORAL | Status: DC
  Administered 2019-06-30 – 2019-07-07 (×8): 25 mg via ORAL
  Filled 2019-06-30 (×7): qty 1

## 2019-06-30 MED ORDER — BLOOD PRESSURE CONTROL BOOK
Freq: Once | Status: AC
  Administered 2019-06-30: 10:00:00
  Filled 2019-06-30: qty 1

## 2019-06-30 MED ORDER — POTASSIUM CHLORIDE CRYS ER 20 MEQ PO TBCR
20.0000 meq | EXTENDED_RELEASE_TABLET | Freq: Two times a day (BID) | ORAL | Status: DC
  Administered 2019-06-30: 20 meq via ORAL
  Filled 2019-06-30: qty 1

## 2019-06-30 NOTE — Evaluation (Signed)
Speech Language Pathology Assessment and Plan  Patient Details  Name: William [name] MRN: [id number] Date of Birth: 1986/02/24  SLP Diagnosis: Cognitive Impairments  Rehab Potential: Good ELOS: 5 to 7 days    Today's Date: 06/30/2019 SLP Individual Time: 1300-1400 SLP Individual Time Calculation (min): 60 min   Problem List:  Patient Active Problem List   Diagnosis Date Noted  . Uncontrolled hypertension   . Acute kidney injury ([place])   . PRES (posterior reversible encephalopathy syndrome) 06/29/2019  . 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 Hypertensive emergency 06/22/2019  . Acute renal failure [facility])    Past Medical History:  Past Medical History:  Diagnosis Date  . Drug abuse, cocaine type ([place]) 06/23/2019  . Hyperlipidemia   . Hypertension    Past Surgical History:  Past Surgical History:  Procedure Laterality Date  . RADIOLOGY WITH ANESTHESIA N/A 06/23/2019   Procedure: MRI OF VESSELS;  Surgeon: Radiologist, Medication, MD;  Location: [place];  Service: Radiology;  Laterality: N/A;    Assessment / Plan / Recommendation Clinical Impression   William [name] is a 38 year oldLH-male with history of HTN, polysubstance abuse who was admitted via ED on 07/09/20shortness of breath,headaches and malignant hypertension. He reported having been out of BP meds x 6 months. He was started on Cardene drip and UDS positive for benzos, THC and cocaine.He was noted to have acute renal failure with BUN/SCR-25/4.95 and significantly elevated troponin of6239. 2D echo done showing EF greater than 65% with severe concentric left ventricular hypertrophy and small pericardial effusion. CT of head showed patchy areas low-density bilateral caudate periventricular white matter and right thalamic region. MRI brain showed numerous scattered acute infarcts in both cerebral hemispheres and brainstem's, severe T2 hyperintensity and expansion of pons, medullary and medial  thalami with superimposed corpus callosum and cerebral white matter disease--radiology question changes due to PRES. CTA head/neck was negative for stenosis, aneurysm and showed fetal PCA origins greater on the right.  Dr. Percival Spanish was consulted for input and felt elevated troponin likely due to demand ischemia from hypertensive urgency,tachycardia and severe LVH. They felt LVH likely due to longstanding uncontrolled hypertension. Troponin's peakedat9,189 and cards felt likely portend to worsened prognosis. Patient has had issues with disorientation as well as mild right-sided weakness. Dr. Leonie Man felt that abnormal MRI with edema brainstem and cytotoxic edema likely from PR ES versus multifactorial demyelination osmotic demyelinationdue to borderline nutritional status, hyponatremia, renal failure and heavy alcohol use. DAPT recommended x3 weeks followed by aspirin alone. Nephrology consulted for input and felt that AKI due to hypertensive emergency and would be slow to improve. Renal ultrasound showed wedge-shaped hyperechoic areas bilaterally question due to infarcts versus pyelonephritis. CT abdomen showed question of gallbladder wall thickening and minimal bilateral perirenal fat stranding. Blood pressures have continued to be labile and renal artery ultrasound was negative for stenosis.Dr. Nadene Rubins that blood pressures appear to be improving and reasonable SBP would be 150s with titration over the next few weeks to months to achieve optimal BP control. He has been educated on cessation of cocaine on his overall health. Therapy has been ongoing and patient continues to be impulsive with poor safety awareness,has cognitive deficits impacting executive functioning and memory has impairments in ability. CIR recommended due to functional decline.   Cognitive linguistic evaluation completed on 06/30/19. Pt demonstrates moderate cognitive impairments that impact higher level cognition  function semi-complex problem solving, memory, anticipatory awareness and executive function. Pt also presents with mild dysarthria related to fast  rate and mildly imprecise articulation - ~ fluctuating speech intelligibility at conversation  (75 to 90%). Skilled ST is required to target these deficits, increase functional independence and reduce caregiver burden. Anticipate that pt will require Min A for cognitive tasks and follow up ST services when discharged from CIR.    Skilled Therapeutic Interventions          Skilled treatment session focused on completion of mildly complex monthly calendar. Pt able to achieve ~ 80% accuracy. Pt with increased use of strategies during session to compensate for memory deficits as evidenced by asking SLP to write information down. Education provided on compensatory memory strategies such as apps on cellphone for shared calendars with spouse.    SLP Assessment  Patient will need skilled [place] [facility] during CIR admission    Recommendations  Recommendations for Other Services: Neuropsych consult Patient destination: Home Follow up Recommendations: Outpatient SLP;Home Health SLP;24 hour supervision/assistance Equipment Recommended: None recommended by SLP    SLP Frequency 3 to 5 out of 7 days   SLP Duration  SLP Intensity  SLP Treatment/Interventions 5 to 7 days  Minumum of 1-2 x/day, 30 to 90 minutes  Internal/external aids;Patient/family education;Cognitive remediation/compensation    Pain    Prior Functioning Cognitive/Linguistic Baseline: Information not available Type of Home: House  Lives With: Spouse Available Help at Discharge: Family;Available 24 hours/day Vocation: Unemployed  Short Term Goals: Week 1: SLP Short Term Goal 1 (Week 1): STGs = LTGs d/t short ELOS  Refer to Care Plan for Long Term Goals  Recommendations for other services: Neuropsych  Discharge Criteria: Patient will be discharged from SLP if  patient refuses treatment 3 consecutive times without medical reason, if treatment goals not met, if there is a change in medical status, if patient makes no progress towards goals or if patient is discharged from hospital.  The above assessment, treatment plan, treatment alternatives and goals were discussed and mutually agreed upon: by patient  Jimma Ortman 06/30/2019, 2:18 PM

## 2019-06-30 NOTE — Plan of Care (Signed)
  Problem: RH BOWEL ELIMINATION Goal: RH STG MANAGE BOWEL WITH ASSISTANCE Description: STG Manage Bowel with mod I Assistance. Outcome: Progressing   Problem: RH SAFETY Goal: RH STG ADHERE TO SAFETY PRECAUTIONS W/ASSISTANCE/DEVICE Description: STG Adhere to Safety Precautions With supervision/cues Assistance/Device. Outcome: Progressing   Problem: RH PAIN MANAGEMENT Goal: RH STG PAIN MANAGED AT OR BELOW PT'S PAIN GOAL Description: Pain less than 3 on scale of 0-10 Outcome: Progressing

## 2019-06-30 NOTE — Progress Notes (Signed)
Inpatient Rehabilitation  Patient information reviewed and entered into eRehab system by Aubrei Bouchie M. Antasia Haider, M.A., CCC/SLP, PPS Coordinator.  Information including medical coding, functional ability and quality indicators will be reviewed and updated through discharge.    

## 2019-06-30 NOTE — Progress Notes (Signed)
Physical Therapy Assessment and Plan  Patient Details  Name: William [name] MRN: [id number] Date of Birth: [birth year]  PT Diagnosis: Abnormality of gait, Hemiparesis non-dominant, Impaired cognition and Muscle weakness Rehab Potential: Excellent ELOS: 5-7 days   Today's Date: 06/30/2019 PT Individual Time: 1005-1101 PT Individual Time Calculation (min): 56 min    Problem List:  Patient Active Problem List   Diagnosis Date Noted  . Uncontrolled hypertension   . Acute kidney injury ([place])   . PRES (posterior reversible encephalopathy syndrome) 06/29/2019  . 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 Hypertensive emergency 06/22/2019  . Acute renal failure [facility])     Past Medical History:  Past Medical History:  Diagnosis Date  . Drug abuse, cocaine type ([place]) 06/23/2019  . Hyperlipidemia   . Hypertension    Past Surgical History:  Past Surgical History:  Procedure Laterality Date  . RADIOLOGY WITH ANESTHESIA N/A 06/23/2019   Procedure: MRI OF VESSELS;  Surgeon: Radiologist, Medication, MD;  Location: [place];  Service: Radiology;  Laterality: N/A;    Assessment & Plan Clinical Impression: Patient is a 33 y.o. year old male with history of HTN, polysubstance abuse who was admitted via ED on 06/22/19 shortness of breath, headaches and malignant hypertension.  He reported having been out of BP meds x 6 months.  He was started on Cardene drip and UDS positive for benzos, THC and cocaine.  He was noted to have acute renal failure with BUN/SCR-25/4.95 and significantly elevated troponin of 6239.  2D echo done showing EF greater than 65% with severe concentric left ventricular hypertrophy and small pericardial effusion.  CT of head showed patchy areas low-density bilateral caudate periventricular white matter and right thalamic region.  MRI brain showed numerous scattered acute infarcts in both cerebral hemispheres and brainstem's, severe T2 hyperintensity and expansion of  pons, medullary and medial thalami with superimposed corpus callosum and cerebral white matter disease--radiology question changes due to PRES.  CTA head/neck was negative for stenosis, aneurysm and showed fetal PCA origins greater on the right.  Dr. Percival Spanish was consulted for input and felt elevated troponin likely due to demand ischemia from hypertensive urgency, tachycardia and severe LVH.  They felt LVH likely due to longstanding uncontrolled hypertension. Troponin's peaked at 9,189 and cards felt likely portend to worsened prognosis.   Patient has had issues with disorientation as well as mild right-sided weakness.  Dr. 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  DAPT recommended x3 weeks followed by aspirin alone.  Nephrology consulted for input and felt that AKI due to hypertensive emergency and would be slow to improve.  Renal ultrasound showed wedge-shaped hyperechoic areas bilaterally question due to infarcts versus pyelonephritis.  CT abdomen shwed question of gallbladder wall thickening and minimal bilateral perirenal fat stranding.  Blood pressures have continued to be labile and renal artery ultrasound was negative for stenosis. Dr. Johnney Ou feels that blood pressures appear to be improving and reasonable SBP would be 150s with titration over the next few weeks to months to achieve optimal BP control.  He has been educated on cessation of cocaine on his overall health.  Therapy has been ongoing and patient continues to be impulsive with poor safety awareness, has cognitive deficits impacting executive functioning and memory has impairments in ability.  CIR recommended due to functional decline.  Patient transferred to CIR on  06/29/2019 .   Patient currently requires min with mobility secondary to muscle  weakness, decreased cardiorespiratoy endurance, decreased coordination, decreased visual perceptual skills, decreased awareness, decreased problem solving and decreased safety awareness, and decreased standing balance, decreased postural control and decreased balance strategies.  Prior to hospitalization, patient was independent  with mobility and lived with Spouse(3 children under 5 y/o) in a House home.  Home access is 2Stairs to enter(chart states 2 STE without rails, but pt reports 3 steps with L rail/doorframe).  Patient will benefit from skilled PT intervention to maximize safe functional mobility, minimize fall risk and decrease caregiver burden for planned discharge home with intermittent assist.  Anticipate patient will benefit from follow up OP at discharge.  PT - End of Session Activity Tolerance: Tolerates 30+ min activity with multiple rests Endurance Deficit: Yes Endurance Deficit Description: 2/2 generalized deconditioning PT Assessment Rehab Potential (ACUTE/IP ONLY): Excellent PT Patient demonstrates impairments in the following area(s): Balance;Safety;Behavior;Endurance;Motor;Perception PT Transfers Functional Problem(s): Bed Mobility;Bed to Chair;Car;Furniture;Floor PT Locomotion Functional Problem(s): Stairs;Ambulation PT Plan PT Intensity: Minimum of 1-2 x/day ,45 to 90 minutes PT Frequency: 5 out of 7 days PT Duration Estimated Length of Stay: 5-7 days PT Treatment/Interventions: Ambulation/gait training;Community reintegration;DME/adaptive equipment instruction;Neuromuscular re-education;Stair training;Psychosocial support;UE/LE Strength taining/ROM;Wheelchair propulsion/positioning;UE/LE Coordination activities;Therapeutic Activities;Skin care/wound management;Pain management;Functional electrical stimulation;Discharge planning;Balance/vestibular training;Disease management/prevention;Functional mobility training;Patient/family education;Splinting/orthotics;Therapeutic  Exercise;Visual/perceptual remediation/compensation;Cognitive remediation/compensation PT Transfers Anticipated Outcome(s): mod I PT Locomotion Anticipated Outcome(s): supervision for stairs & gait in community, mod i for gait in home PT Recommendation Recommendations for Other Services: Speech consult;Neuropsych consult;Therapeutic Recreation consult Therapeutic Recreation Interventions: Pet therapy;Stress management Follow Up Recommendations: Outpatient PT Patient destination: Home Equipment Recommended: To be determined  Skilled Therapeutic Intervention Patient received in recliner & agreeable to tx. Educated pt on ELOS, daily therapy schedule, weekly team meeting, safety while in CIR (to not get up without staff assistance), and other CIR information. Pt denies c/o pain. Pt ambulates around unit without AD & supervision with decreased reciprocal arm swing on RUE. Pt negotiates ramp with close supervision, mulch with close supervision, curb with CGA, stairs with rail with min assist, and sit<>Stand transfers with supervision. Pt completes berg balance test & scores 38/56; educated pt on interpretation of score & current fall risk. Patient demonstrates increased fall risk as noted by score of 38/56 on Berg Balance Scale.  (<36= high risk for falls, close to 100%; 37-45 significant >80%; 46-51 moderate >50%; 52-55 lower >25%). Pt utilized kinetron in standing with BUE>1UE>no UE support, increasing to min assist for balance, with activity focusing on weight shifting L<>R, dynamic balance & NMR. Pt is able to pathfind back to room with min cuing to locate/use signs on wall and extra time. Pt is able to unpack clothing with distant supervision for standing balance. Pt completes bed mobility with bed flat without rails with independence. At end of session pt left sitting in recliner with chair alarm donned & all needs in reach.    PT Evaluation Precautions/Restrictions Precautions Precautions:  Fall Precaution Comments: monitor BP; R inattention Restrictions Weight Bearing Restrictions: No  General Chart Reviewed: Yes Additional Pertinent History: HTN Response to Previous Treatment: Patient with no complaints from previous session. Family/Caregiver Present: No   Home Living/Prior Functioning Home Living Available Help at Discharge: Family;Available 24 hours/day Type of Home: House Home Access: Stairs to enter(chart states 2 STE without rails, but pt reports 3 steps with L rail/doorframe) Entrance Stairs-Number of Steps: 2 Entrance Stairs-Rails: None Home Layout: One level Bathroom Shower/Tub: Tub/shower  unit;Curtain Bathroom Toilet: Programmer, systems: Yes  Lives With: Spouse(3 children under 3 y/o) Prior Function Level of Independence: Independent with basic ADLs;Independent with transfers;Independent with gait;Independent with homemaking with ambulation  Able to Take Stairs?: Yes Driving: Yes Vocation Requirements: recently laid off from [employer] 2/2 pandemic Leisure: Hobbies-yes (Comment) Comments: producing music, has 2 pitbulls  Vision/Perception  Pt does not wear glasses or contacts at baseline. Pt denies diplopia but reports some fuzziness, "everything looks like a dream". Perception: to be assessed further in functional context.   Cognition Overall Cognitive Status: Impaired/Different from baseline Arousal/Alertness: Awake/alert Orientation Level: Oriented X4 Awareness: Impaired Awareness Impairment: Anticipatory impairment Safety/Judgment: Impaired  Sensation Sensation Light Touch: Appears Intact(BLE) Proprioception: Appears Intact(BLE) Coordination Gross Motor Movements are Fluid and Coordinated: No(very mild impairment in RLE heel to shin) Fine Motor Movements are Fluid and Coordinated: Yes Finger Nose Finger Test: BUE WNL  Motor  Motor Motor: Abnormal postural alignment and control Motor - Skilled Clinical Observations: mild R  hemiparesis   Mobility Bed Mobility Bed Mobility: Rolling Right;Rolling Left;Sit to Supine;Supine to Sit(bed flat, no rails) Rolling Right: Independent Rolling Left: Independent Supine to Sit: Independent Sit to Supine: Independent Transfers Transfers: Sit to Stand;Stand to Sit Sit to Stand: Supervision/Verbal cueing Stand to Sit: Supervision/Verbal cueing   Locomotion  Gait Ambulation: Yes Gait Assistance: Supervision/Verbal cueing Gait Distance (Feet): 200 Feet Assistive device: None Gait Gait: Yes Gait Pattern: (decreased RUE reciprocal arm swing, slightly decreased weight shifting L) Stairs / Additional Locomotion Stairs: Yes Stairs Assistance: Minimal Assistance - Patient > 75% Stair Management Technique: (1 rail) Number of Stairs: 4 Height of Stairs: 6(inches) Ramp: Supervision/Verbal cueing(ambulatory without AD) Curb: Contact Guard/Touching assist Wheelchair Mobility Wheelchair Mobility: No   Trunk/Postural Assessment  Cervical Assessment Cervical Assessment: Exceptions to WFL(forward head) Thoracic Assessment Thoracic Assessment: Within Functional Limits Lumbar Assessment Lumbar Assessment: Within Functional Limits Postural Control Postural Control: Deficits on evaluation Righting Reactions: slightly delayed Protective Responses: slightly delayed   Balance Balance Balance Assessed: Yes Standardized Balance Assessment Standardized Balance Assessment: Berg Balance Test Berg Balance Test Sit to Stand: Able to stand without using hands and stabilize independently Standing Unsupported: Able to stand 2 minutes with supervision Sitting with Back Unsupported but Feet Supported on Floor or Stool: Able to sit safely and securely 2 minutes Stand to Sit: Sits independently, has uncontrolled descent Transfers: Able to transfer safely, minor use of hands Standing Unsupported with Eyes Closed: Able to stand 10 seconds with supervision Standing Ubsupported with Feet  Together: Able to place feet together independently and stand for 1 minute with supervision From Standing, Reach Forward with Outstretched Arm: Can reach forward >12 cm safely (5") From Standing Position, Pick up Object from Floor: Able to pick up shoe, needs supervision From Standing Position, Turn to Look Behind Over each Shoulder: Looks behind one side only/other side shows less weight shift Turn 360 Degrees: Needs close supervision or verbal cueing Standing Unsupported, Alternately Place Feet on Step/Stool: Able to complete 4 steps without aid or supervision Standing Unsupported, One Foot in Front: Able to plae foot ahead of the other independently and hold 30 seconds Standing on One Leg: Tries to lift leg/unable to hold 3 seconds but remains standing independently Total Score: 38   Extremity Assessment  All extremities ROM WFL, strength not tested, BLE strength grossly 3+/5 as pt able to weight bear without buckling noted   Refer to Care Plan for Long Term Goals  Recommendations for other services: Neuropsych and Therapeutic Recreation  Pet therapy, Stress management and Outing/community reintegration  Discharge Criteria: Patient will be discharged from PT if patient refuses treatment 3 consecutive times without medical reason, if treatment goals not met, if there is a change in medical status, if patient makes no progress towards goals or if patient is discharged from hospital.  The above assessment, treatment plan, treatment alternatives and goals were discussed and mutually agreed upon: by patient  [name] 06/30/2019, 11:09 AM

## 2019-06-30 NOTE — Progress Notes (Signed)
Social Work Assessment and Plan   Patient Details  Name: William [name] MRN: [id number] Date of Birth: 1986/05/23  Today's Date: 06/30/2019  Problem List:  Patient Active Problem List   Diagnosis Date Noted  . Uncontrolled hypertension   . Acute kidney injury ([place])   . PRES (posterior reversible encephalopathy syndrome) 06/29/2019  . 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 Hypertensive emergency 06/22/2019  . Acute renal failure [facility])    Past Medical History:  Past Medical History:  Diagnosis Date  . Drug abuse, cocaine type ([place]) 06/23/2019  . Hyperlipidemia   . Hypertension    Past Surgical History:  Past Surgical History:  Procedure Laterality Date  . RADIOLOGY WITH ANESTHESIA N/A 06/23/2019   Procedure: MRI OF VESSELS;  Surgeon: Radiologist, Medication, MD;  Location: [place];  Service: Radiology;  Laterality: N/A;   Social History:  reports that he has been smoking. He has been smoking about 1.00 pack per day. He has never used smokeless tobacco. He reports current alcohol use. He reports current drug use. Drugs: Marijuana, Cocaine, and Benzodiazepines.  Family / Support Systems Marital Status: Married Patient Roles: Spouse, Parent Spouse/Significant Other: wife, William [name] @ [phone] Children: 3 children ages 8, 67 and 38 yrs old Anticipated Caregiver: wife Ability/Limitations of Caregiver: wife disabled but funtionally independent Caregiver Availability: 24/7 Family Dynamics: Pt describes wife as very supportive and denies any concerns about assistance at home.  Social History Preferred language: English Religion: Baptist Cultural Background: NA Read: Yes Write: Yes Employment Status: Unemployed Date Retired/Disabled/Unemployed: due to [place] History/Current Legal Issues: None Guardian/Conservator: None - per MD, pt is capable of making decisions on his own behalf.   Abuse/Neglect Abuse/Neglect Assessment Can Be  Completed: Yes Physical Abuse: Denies Verbal Abuse: Denies Sexual Abuse: Denies Exploitation of patient/patient's resources: Denies Self-Neglect: Denies  Emotional Status Pt's affect, behavior and adjustment status: Pt very talkative and completes assessment without much difficulty.  Did require some redirection at times.  Pt presents with optimism about his recovery.  He is concerned about ability to return to at some point.  Denies any significant emotional distress, however, have referred for neuropsychology consult for additional evaluation. Recent Psychosocial Issues: None Psychiatric History: None Substance Abuse History: Pt with noted h/o cocaine use - no formal cessation treatment.  Patient / Family Perceptions, Expectations & Goals Pt/Family understanding of illness & functional limitations: Pt and family with basic understanding of strokes and current functional limitations. Premorbid pt/family roles/activities: Pt was completely independent but had been laid-off when [place] hit. Anticipated changes in roles/activities/participation: Wife to provide primary caregiver support Pt/family expectations/goals: "I just wanna get home as soon as I can."  [employer]: None Premorbid Home Care/DME Agencies: None Transportation available at discharge: yes Resource referrals recommended: Neuropsychology  Discharge Planning Living Arrangements: Spouse/significant other, Children Support Systems: Spouse/significant other, Children Type of Residence: Private residence Insurance Resources: Teacher, adult education Resources: Other (Comment)(unemployment) Financial Screen Referred: Previously completed Living Expenses: Rent Money Management: Patient, Spouse Does the patient have any problems obtaining your medications?: Yes (Describe)(uninsured) Home Management: pt and family share responsibilities Patient/Family Preliminary Plans: Pt to return home with wife and  children. Social Work Anticipated Follow Up Needs: HH/OP Expected length of stay: ELOS 7 days  Clinical Impression Pleasant gentleman here following multiple infarcts and making good recovery progress with anticipated short LOS.  Good family support and able to provide 24/7 care.  Denies any emotional distress. Will follow for d/c planning and support  needs.  William [name] 06/30/2019, 12:26 PM

## 2019-06-30 NOTE — H&P (Signed)
Physical Medicine and Rehabilitation Admission H&P        Chief Complaint  Patient presents with    Functional deficits due to PRES with multiple infarcts      HPI: William [name] is a 33 year old LH-male with history of HTN, polysubstance abuse who was admitted via ED on 06/22/19 shortness of breath, headaches and malignant hypertension.  He reported having been out of BP meds x 6 months.  He was started on Cardene drip and UDS positive for benzos, THC and cocaine.  He was noted to have acute renal failure with BUN/SCR-25/4.95 and significantly elevated troponin of 6239.  2D echo done showing EF greater than 65% with severe concentric left ventricular hypertrophy and small pericardial effusion.  CT of head showed patchy areas low-density bilateral caudate periventricular white matter and right thalamic region.  MRI brain showed numerous scattered acute infarcts in both cerebral hemispheres and brainstem's, severe T2 hyperintensity and expansion of pons, medullary and medial thalami with superimposed corpus callosum and cerebral white matter disease--radiology question changes due to PRES.  CTA head/neck was negative for stenosis, aneurysm and showed fetal PCA origins greater on the right.   Dr. Percival Spanish was consulted for input and felt elevated troponin likely due to demand ischemia from hypertensive urgency, tachycardia and severe LVH.  They felt LVH likely due to longstanding uncontrolled hypertension. Troponin's peaked at 9,189 and cards felt likely portend to worsened prognosis.   Patient has had issues with disorientation as well as mild right-sided weakness.  Dr. 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  DAPT recommended x3 weeks followed by aspirin alone.  Nephrology consulted for input and felt  that AKI due to hypertensive emergency and would be slow to improve.  Renal ultrasound showed wedge-shaped hyperechoic areas bilaterally question due to infarcts versus pyelonephritis.  CT abdomen showed question of gallbladder wall thickening and minimal bilateral perirenal fat stranding.  Blood pressures have continued to be labile and renal artery ultrasound was negative for stenosis. Dr. Johnney Ou feels that blood pressures appear to be improving and reasonable SBP would be 150s with titration over the next few weeks to months to achieve optimal BP control.  He has been educated on cessation of cocaine on his overall health.  Therapy has been ongoing and patient continues to be impulsive with poor safety awareness, has cognitive deficits impacting executive functioning and memory has impairments in ability.  CIR recommended due to functional decline     Review of Systems  Constitutional: Negative for chills and fever.  HENT: Negative for hearing loss and tinnitus.   Eyes: Negative for blurred vision and double vision.  Respiratory: Negative for shortness of breath and wheezing.   Cardiovascular: Negative for chest pain and palpitations.  Gastrointestinal: Negative for abdominal pain, heartburn and nausea.  Musculoskeletal: Negative for myalgias and neck pain.  Skin: Negative for rash.  Neurological: Negative for dizziness, sensory change and headaches.  Psychiatric/Behavioral: Negative for depression. The patient is not nervous/anxious.           Past Medical History:  Diagnosis Date   Drug abuse, cocaine type ([place]) 06/23/2019   Hyperlipidemia     Hypertension            Past Surgical History:  Procedure Laterality Date   RADIOLOGY WITH ANESTHESIA N/A 06/23/2019  Procedure: MRI OF VESSELS;  Surgeon: Radiologist, Medication, MD;  Location: [facility];  Service: Radiology;  Laterality: N/A;          Family History  Problem Relation Age of Onset   Diabetes Sister     Diabetes  Maternal Uncle        Social History:  Married--wife works from home. Used to work for [employer] off due to pandemic. He reports that he has been smoking. He has been smoking about 1.00 pack per day. He has never used smokeless tobacco. He reports current alcohol use. He reports current drug use. Drugs: Marijuana, Cocaine, and Benzodiazepines.           Allergies  Allergen Reactions   Reglan [Metoclopramide]        Develops akathisia           Medications Prior to Admission  Medication Sig Dispense Refill   acetaminophen (TYLENOL) 500 MG tablet Take 2 tablets (1,000 mg total) by mouth every 8 (eight) hours as needed. (Patient not taking: Reported on 06/21/2019) 30 tablet 0   cyclobenzaprine (FLEXERIL) 5 MG tablet Take 1 tablet (5 mg total) by mouth 2 (two) times daily as needed for muscle spasms. (Patient not taking: Reported on 06/21/2019) 15 tablet 0   ibuprofen (ADVIL,MOTRIN) 600 MG tablet Take 1 tablet (600 mg total) by mouth every 6 (six) hours as needed. (Patient not taking: Reported on 06/21/2019) 30 tablet 0   lisinopril (PRINIVIL,ZESTRIL) 10 MG tablet Take 1 tablet (10 mg total) by mouth daily. (Patient not taking: Reported on 06/21/2019) 30 tablet 0   traMADol (ULTRAM) 50 MG tablet Take 1 tablet (50 mg total) by mouth every 6 (six) hours as needed. (Patient not taking: Reported on 06/21/2019) 15 tablet 0     Drug Regimen Review  Drug regimen was reviewed and remains appropriate with no significant issues identified   Home: Home Living Family/patient expects to be discharged to:: Private residence Living Arrangements: (spouse with 37, 59 and 83 year old children) Available Help at Discharge: Family, Available 24 hours/day(wife) Type of Home: House Home Access: Stairs to enter Technical brewer of Steps: 2 Entrance Stairs-Rails: None Home Layout: One level Bathroom Shower/Tub: Tub/shower unit, Air cabin crew Accessibility: Yes Additional  Comments: clarified with wife  Lives With: Spouse, Son, Daughter   Functional History: Prior Function Level of Independence: Independent Comments: lost job March   Functional Status:  Mobility: Bed Mobility Overal bed mobility: Modified Independent Bed Mobility: Supine to Sit, Sit to Supine Supine to sit: Supervision Sit to supine: Supervision General bed mobility comments: supervision for safety, somewhat impulsive Transfers Overall transfer level: Needs assistance Equipment used: None Transfers: Sit to/from Stand Sit to Stand: Min guard General transfer comment: min guard for safety; pt able to stand without physical assistance Ambulation/Gait Ambulation/Gait assistance: Min assist, Mod assist Gait Distance (Feet): 200 Feet Assistive device: Straight cane(assist at trunk with gait belt ) Gait Pattern/deviations: Step-through pattern, Decreased stride length, Scissoring, Staggering right, Staggering left, Narrow base of support General Gait Details: pt continues to present with impaired coordination and with multiple LOB requiring assistance to recover; significant gait deviations with head turns, directional changes, turning, and changes to gait speed Gait velocity: reduced Gait velocity interpretation: <1.8 ft/sec, indicate of risk for recurrent falls     ADL: ADL Overall ADL's : Needs assistance/impaired Eating/Feeding: Supervision/ safety, Set up, Sitting Grooming: Wash/dry hands, Wash/dry face, Oral care, Min guard, Standing Grooming Details (indicate cue type and reason):  improving attention, with ability to locate items in Rt visual field without cues Upper Body Bathing: Min guard, Sitting, Standing Lower Body Bathing: Minimal assistance, Sit to/from stand Upper Body Dressing : Minimal assistance, Sitting Lower Body Dressing: Minimal assistance, Sit to/from stand Lower Body Dressing Details (indicate cue type and reason): pt donned pants this session with min cues  due to decreased awareness after donning pants backwards. Toilet Transfer: Moderate assistance, Ambulation Toileting- Clothing Manipulation and Hygiene: Minimal assistance, Sit to/from stand Functional mobility during ADLs: Minimal assistance, Moderate assistance General ADL Comments: Rt inattention during mobility still persists.  Pt also continues with decreased safety awareness and awareness of errors.   Cognition: Cognition Overall Cognitive Status: Impaired/Different from baseline Arousal/Alertness: Awake/alert Orientation Level: Oriented X4 Attention: Sustained Sustained Attention: Impaired Memory: Impaired Memory Impairment: Decreased short term memory, Decreased recall of new information Awareness: Impaired Problem Solving: Impaired Executive Function: Self Monitoring, Organizing, Initiating Organizing: Impaired Initiating: Impaired Self Monitoring: Impaired Behaviors: Restless Safety/Judgment: Impaired Cognition Arousal/Alertness: Awake/alert Behavior During Therapy: WFL for tasks assessed/performed Overall Cognitive Status: Impaired/Different from baseline Area of Impairment: Safety/judgement, Problem solving, Following commands, Memory, Attention Orientation Level: Situation Current Attention Level: Selective Memory: Decreased short-term memory Following Commands: Follows one step commands with increased time, Follows one step commands inconsistently Safety/Judgement: Decreased awareness of safety, Decreased awareness of deficits Awareness: Intellectual Problem Solving: Slow processing, Requires verbal cues, Decreased initiation General Comments: pt has difficulty explaining visual deficits and needs commands repeated sometimes multiple times before following through despite responding "okay"     Blood pressure (!) 183/119, pulse 100, temperature 98 F (36.7 C), temperature source Oral, resp. rate 18, height 5\' 9"  (1.753 m), weight 85.7 kg, SpO2 99 %. Physical Exam    Nursing note and vitals reviewed. Constitutional: He is oriented to person, place, and time. He appears well-developed and well-nourished.  Polite and appropriate.  HENT:  Head: Normocephalic and atraumatic.  Eyes: Pupils are equal, round, and reactive to light.  Neck: Normal range of motion. No thyromegaly present.  Cardiovascular: Normal rate and regular rhythm. Exam reveals no friction rub.  No murmur heard. Respiratory: Effort normal. No respiratory distress. He has no wheezes.  GI: Soft. He exhibits no distension. There is no abdominal tenderness.  Musculoskeletal:        General: No tenderness or edema.  Neurological: He is alert and oriented to person, place, and time. He displays normal reflexes. He exhibits normal muscle tone.  Pt with reasonable insight and awareness. No focal CN abnl. RUE 4+/5. LUE 4/5. B/L LE 4+/5. No focal sensory findings.   Psychiatric:  Pt pleasant and cooperative. A little anxious. Decreased concentration      Lab Results Last 48 Hours        Results for orders placed or performed during the hospital encounter of 06/21/19 (from the past 48 hour(s))  Renal function panel     Status: Abnormal    Collection Time: 06/28/19  4:51 AM  Result Value Ref Range    Sodium 135 135 - 145 mmol/L    Potassium 2.9 (L) 3.5 - 5.1 mmol/L    Chloride 101 98 - 111 mmol/L    CO2 23 22 - 32 mmol/L    Glucose, Bld 114 (H) 70 - 99 mg/dL    BUN 28 (H) 6 - 20 mg/dL    Creatinine, Ser 5.60 (H) 0.61 - 1.24 mg/dL    Calcium 9.1 8.9 - 10.3 mg/dL    Phosphorus 4.3 2.5 - 4.6 mg/dL  Albumin 3.2 (L) 3.5 - 5.0 g/dL    GFR calc non Af Amer 12 (L) >60 mL/min    GFR calc Af Amer 14 (L) >60 mL/min    Anion gap 11 5 - 15      Comment: Performed at [facility] [address] [place], [place] [address]  Protein / creatinine ratio, urine     Status: Abnormal    Collection Time: 06/28/19 10:38 AM  Result Value Ref Range    Creatinine, Urine 73.80 mg/dL    Total Protein,  Urine 189 mg/dL      Comment: NO NORMAL RANGE ESTABLISHED FOR THIS TEST RESULTS CONFIRMED BY MANUAL DILUTION      Protein Creatinine Ratio 2.56 (H) 0.00 - 0.15 mg/mg[Cre]      Comment: Performed at [facility] [address], [place], [place] [address]  Renal function panel     Status: Abnormal    Collection Time: 06/29/19  4:51 AM  Result Value Ref Range    Sodium 136 135 - 145 mmol/L    Potassium 3.7 3.5 - 5.1 mmol/L    Chloride 104 98 - 111 mmol/L    CO2 21 (L) 22 - 32 mmol/L    Glucose, Bld 108 (H) 70 - 99 mg/dL    BUN 29 (H) 6 - 20 mg/dL    Creatinine, Ser 5.88 (H) 0.61 - 1.24 mg/dL    Calcium 9.1 8.9 - 10.3 mg/dL    Phosphorus 3.3 2.5 - 4.6 mg/dL    Albumin 3.3 (L) 3.5 - 5.0 g/dL    GFR calc non Af Amer 11 (L) >60 mL/min    GFR calc Af Amer 13 (L) >60 mL/min    Anion gap 11 5 - 15      Comment: Performed at [facility] [address], [place], [place] [address]     Imaging Results (Last 48 hours)  No results found.           Medical Problem List and Plan: 1.  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  Antithrombotics: -DVT/anticoagulation:  Pharmaceutical: Lovenox             -antiplatelet therapy: Continue ASA/Plavix (added 7/16) X 3 weeks followed by ASA alone.  3. Pain Management: Tylenol as needed 4. Mood: LCSW to follow for evaluation and support             -antipsychotic agents: N/A 5. Neuropsych: This patient is capable of making decisions on  own behalf. 6. Skin/Wound Care: Routine pressure relief measures 7. Fluids/Electrolytes/Nutrition: Monitor I's and O's.  Check lites in a.m. 8 . HTN: Monitor blood pressures 3 times daily.  Continue coreg, Cardizem, hydralazine             -bp control has been improving             -titrate regimen as needed. 9.  Acute kidney injury: Monitor with serial checks for recovery.  Serum creatinine rising  up to 5.88 today--- nephrology will continue to monitor for for now and feels renal status may be slow to improve vs irreversible.  10. Persistent hypokalemia: Improved post supplementation. Renin/aldosterone levels pending.  11. Hx of polysubstance abuse: counseling as appropriate        Post Admission Physician Evaluation: 1. Functional deficits secondary  to PRES. 2. Patient is  admitted to receive collaborative, interdisciplinary care between the physiatrist, rehab nursing staff, and therapy team. 3. Patient's level of medical complexity and substantial therapy needs in context of that medical necessity cannot be provided at a lesser intensity of care such as a SNF. 4. Patient has experienced substantial functional loss from his/her baseline which was documented above under the "Functional History" and "Functional Status" headings.  Judging by the patient's diagnosis, physical exam, and functional history, the patient has potential for functional progress which will result in measurable gains while on inpatient rehab.  These gains will be of substantial and practical use upon discharge  in facilitating mobility and self-care at the household level. 5. Physiatrist will provide 24 hour management of medical needs as well as oversight of the therapy plan/treatment and provide guidance as appropriate regarding the interaction of the two. 6. The Preadmission Screening has been reviewed and patient status is unchanged unless otherwise stated above. 7. 24 hour rehab nursing will assist with bladder management, bowel management, safety, skin/wound care, disease management, medication administration, pain management and patient education  and help integrate therapy concepts, techniques,education, etc. 8. PT will assess and treat for/with: Lower extremity strength, range of motion, stamina, balance, functional mobility, safety, adaptive techniques and equipment .   Goals are: mod I to supervision. 9. OT will  assess and treat for/with: ADL's, functional mobility, safety, upper extremity strength, adaptive techniques and equipment .   Goals are: mod I to supervision. Therapy may proceed with showering this patient. 10. SLP will assess and treat for/with: cognition, communication.  Goals are: mod I to supervision. 11. Case Management and Social Worker will assess and treat for psychological issues and discharge planning. 12. Team conference will be held weekly to assess progress toward goals and to determine barriers to discharge. 13. Patient will receive at least 3 hours of therapy per day at least 5 days per week. 14. ELOS: 7 days       15. Prognosis:  excellent   I have personally performed a face to face diagnostic evaluation of this patient and formulated the key components of the plan.  Additionally, I have personally reviewed laboratory data, imaging studies, as well as relevant notes and concur with the physician assistant's documentation above.  Meredith Staggers, MD, [place], PA-C 06/29/2019

## 2019-06-30 NOTE — Progress Notes (Signed)
[place] PHYSICAL MEDICINE & REHABILITATION PROGRESS NOTE   Subjective/Complaints: Pt had a fair night. Was anxious to get moving with therapies this morning  ROS: Patient denies fever, rash, sore throat, blurred vision, nausea, vomiting, diarrhea, cough, shortness of breath or chest pain, joint or back pain, headache, or mood change.   Objective:   No results found. Recent Labs    06/30/19 0558  WBC 9.6  HGB 10.9*  HCT 32.6*  PLT 338   Recent Labs    06/29/19 0451 06/30/19 0558  NA 136 135  K 3.7 3.3*  CL 104 102  CO2 21* 23  GLUCOSE 108* 99  BUN 29* 33*  CREATININE 5.88* 6.31*  CALCIUM 9.1 9.3    Intake/Output Summary (Last 24 hours) at 06/30/2019 0946 Last data filed at 06/29/2019 1900 Gross per 24 hour  Intake 240 ml  Output -  Net 240 ml     Physical Exam: Vital Signs Blood pressure (!) 166/116, pulse 78, temperature 97.9 F (36.6 C), resp. rate 18, height 5\' 9"  (1.753 m), weight 84.7 kg, SpO2 100 %. Constitutional: No distress . Vital signs reviewed. HEENT: EOMI, oral membranes moist Neck: supple Cardiovascular: RRR without murmur. No JVD    Respiratory: CTA Bilaterally without wheezes or rales. Normal effort    GI: BS +, non-tender, non-distended  Musculoskeletal:  General: No tendernessor edema.  Neurological: fair insight, a little distracted. He isalertand oriented to person, place, and time. He displaysnormal reflexes. He exhibitsnormal muscle tone. Pt with reasonable insight and awareness. No focal CN abnl. RUE 4+/5. LUE remains 4/5. B/L LE 4+/5. Normal sensation.  Psychiatric:  Pleasant, anxious   Assessment/Plan: 1. Functional deficits secondary to PRES which require 3+ hours per day of interdisciplinary therapy in a comprehensive inpatient rehab setting.  Physiatrist is providing close team supervision and 24 hour management of active medical problems listed below.  Physiatrist and rehab team continue to assess barriers to  discharge/monitor patient progress toward functional and medical goals  Care Tool:  Bathing    Body parts bathed by patient: Right arm, Left arm, Abdomen, Chest, Front perineal area, Buttocks, Right upper leg, Left upper leg, Right lower leg, Left lower leg, Face         Bathing assist Assist Level: Independent     Upper Body Dressing/Undressing Upper body dressing   What is the patient wearing?: Hospital gown only    Upper body assist      Lower Body Dressing/Undressing Lower body dressing      What is the patient wearing?: Hospital gown only(scrub pants)     Lower body assist       Toileting Toileting    Toileting assist Assist for toileting: Supervision/Verbal cueing     Transfers Chair/bed transfer  Transfers assist           Locomotion Ambulation   Ambulation assist              Walk 10 feet activity   Assist           Walk 50 feet activity   Assist           Walk 150 feet activity   Assist           Walk 10 feet on uneven surface  activity   Assist           Wheelchair     Assist               Wheelchair 50  feet with 2 turns activity    Assist            Wheelchair 150 feet activity     Assist          Medical Problem List and Plan: 1.Functional and cognitive deficits and left hemiparesissecondary to scattered bi-cerebral infarcts/PRES -Patient is beginning CIR therapies today including PT, OT, and SLP  2. Antithrombotics: -DVT/anticoagulation:Pharmaceutical:Lovenox -antiplatelet therapy: ContinueASA/Plavix(added 7/16) X 3 weeks followed by ASA alone. 3. Pain Management:Tylenol as needed 4. Mood:LCSW to follow for evaluation and support -antipsychotic agents: N/A 5. Neuropsych: This patientiscapable of making decisions on own behalf. 6. Skin/Wound Care:Routine pressure relief measures 7.  Fluids/Electrolytes/Nutrition:Monitor I's and O's. Check lites in a.m. 8 . BUL:AGTXMIW blood pressures 3 times daily. Continue coreg, Cardizem, hydralazine -bp elevated again this morning -coreg was just increased to 25mg  bid 7/16   -nephrology following, dopplers negative for RAS  -no RAAS inhibitors d/t #9 9.Acute kidney injury:  Cr continues to climb  -nephrology following, defer to their recs 10. Persistent hypokalemia: potassium 3.3 today  -replace  - Renin/aldosterone levels still pending. 11. Hx of polysubstance abuse: counseling as appropriate     LOS: 1 days A FACE TO FACE EVALUATION WAS PERFORMED  Meredith Staggers 06/30/2019, 9:46 AM

## 2019-06-30 NOTE — Evaluation (Signed)
Occupational Therapy Assessment and Plan  Patient Details  Name: Brian Zeitlin MRN: [id number] Date of Birth: November 04, 1986  OT Diagnosis: cognitive deficits and muscle weakness (generalized) Rehab Potential: Rehab Potential (ACUTE ONLY): Good ELOS: 5-7 days   Today's Date: 06/30/2019 OT Individual Time: 7846-9629 OT Individual Time Calculation (min): 70 min     Problem List:  Patient Active Problem List   Diagnosis Date Noted  . PRES (posterior reversible encephalopathy syndrome) 06/29/2019  . 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 Hypertensive emergency 06/22/2019  . Acute renal failure [facility])     Past Medical History:  Past Medical History:  Diagnosis Date  . Drug abuse, cocaine type ([employer]) 06/23/2019  . Hyperlipidemia   . Hypertension    Past Surgical History:  Past Surgical History:  Procedure Laterality Date  . RADIOLOGY WITH ANESTHESIA N/A 06/23/2019   Procedure: MRI OF VESSELS;  Surgeon: Radiologist, Medication, MD;  Location: [place];  Service: Radiology;  Laterality: N/A;    Assessment & Plan Clinical Impression: Guillermo Nehring is a 33 year old LH-male with history of HTN, polysubstance abuse who was admitted via ED on 06/22/19 shortness of breath, headaches and malignant hypertension.  He reported having been out of BP meds x 6 months.  He was started on Cardene drip and UDS positive for benzos, THC and cocaine.  He was noted to have acute renal failure with BUN/SCR-25/4.95 and significantly elevated troponin of 6239.  2D echo done showing EF greater than 65% with severe concentric left ventricular hypertrophy and small pericardial effusion.  CT of head showed patchy areas low-density bilateral caudate periventricular white matter and right thalamic region.  MRI brain showed numerous scattered acute infarcts in both cerebral hemispheres and brainstem's, severe T2 hyperintensity and expansion of pons, medullary and medial thalami with superimposed corpus  callosum and cerebral white matter disease--radiology question changes due to PRES.  CTA head/neck was negative for stenosis, aneurysm and showed fetal PCA origins greater on the right.  Dr. Percival Spanish was consulted for input and felt elevated troponin likely due to demand ischemia from hypertensive urgency, tachycardia and severe LVH.  They felt LVH likely due to longstanding uncontrolled hypertension. Troponin's peaked at 9,189 and cards felt likely portend to worsened prognosis.   Patient has had issues with disorientation as well as mild right-sided weakness.  Dr. 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  DAPT recommended x3 weeks followed by aspirin alone.  Nephrology consulted for input and felt that AKI due to hypertensive emergency and would be slow to improve.  Renal ultrasound showed wedge-shaped hyperechoic areas bilaterally question due to infarcts versus pyelonephritis.  CT abdomen showed question of gallbladder wall thickening and minimal bilateral perirenal fat stranding.  Blood pressures have continued to be labile and renal artery ultrasound was negative for stenosis. Dr. Johnney Ou feels that blood pressures appear to be improving and reasonable SBP would be 150s with titration over the next few weeks to months to achieve optimal BP control.  He has been educated on cessation of cocaine on his overall health.  Therapy has been ongoing and patient continues to be impulsive with poor safety awareness, has cognitive deficits impacting executive functioning and memory has impairments in ability.  CIR recommended due to functional decline Patient transferred to CIR on 06/29/2019 .    Patient currently requires min with basic self-care  skills secondary to muscle weakness, decreased cardiorespiratoy endurance,  decreased attention, decreased awareness, decreased problem solving, decreased safety awareness and decreased memory and decreased standing balance, decreased postural control and decreased balance strategies.  Prior to hospitalization, patient could complete ADLs/IADLs with independent .  Patient will benefit from skilled intervention to decrease level of assist with basic self-care skills, increase independence with basic self-care skills and increase level of independence with iADL prior to discharge home with care partner.  Anticipate patient will require 24 hour supervision and follow up home health.  OT - End of Session Activity Tolerance: Endurance does not limit participation in activity Endurance Deficit: No Endurance Deficit Description: 2/2 generalized deconditioning OT Assessment Rehab Potential (ACUTE ONLY): Good OT Barriers to Discharge: Medication compliance OT Barriers to Discharge Comments: Pt admited as a result of not taking BP medications OT Patient demonstrates impairments in the following area(s): Balance;Safety;Cognition;Endurance;Motor OT Basic ADL's Functional Problem(s): Grooming;Bathing;Dressing;Toileting OT Transfers Functional Problem(s): Toilet;Tub/Shower OT Additional Impairment(s): None OT Plan OT Intensity: Minimum of 1-2 x/day, 45 to 90 minutes OT Frequency: 5 out of 7 days OT Duration/Estimated Length of Stay: 5-7 days OT Treatment/Interventions: Balance/vestibular training;Discharge planning;Pain management;Self Care/advanced ADL retraining;Therapeutic Activities;UE/LE Coordination activities;Therapeutic Exercise;Patient/family education;Functional mobility training;Disease mangement/prevention;Cognitive remediation/compensation;Community reintegration;DME/adaptive equipment instruction;Neuromuscular re-education;Psychosocial support;UE/LE Strength taining/ROM OT Self Feeding Anticipated Outcome(s): Indep OT Basic Self-Care Anticipated Outcome(s): Mod I OT  Toileting Anticipated Outcome(s): Mod I OT Bathroom Transfers Anticipated Outcome(s): Supervision- Mod I OT Recommendation Patient destination: Home Follow Up Recommendations: Home health OT Equipment Recommended: To be determined   Skilled Therapeutic Intervention Pt seen for OT eval and ADL bathing/dressing session. Pt asleep in supine upon arrival, snoring loudly, but able to be easily awoken and agreeable to tx session, denying pain. He transferred to sitting EOB with hospital bed functions with supervision. He ate breakfast seated EOB, able to attend to all areas of meal tray independently.  He ambulated throughout room with min A, no AD. He did demonstrate some impulsivity with decreased awareness to deficits, however, did demonstrate good awareness when removing items out of walkway during ambulation that posed fall risk.  He stood to urinate with steadying assist. He doffed clothing in prep for shower in standing position with steadying assist and stabilizing self on wall. Recommended pt to sit to complete these dynamic tasks at d/c when he is completing tasks mod I in order to reduce fall risk. He bathed seated on tub transfer bench, CGA for standing portions when completing pericare/buttock hygiene, all other body parts completed in sitting with supervision.  He dressed seated in standard chair, independently initiated sitting to thread on underwear/pants and standing with CGA to pull pants up. Grooming tasks completed standing at sink with supervision and UE supported on sink ledge.  Following seated rest break, he carried empty meal tray to cart to address divided attention with min A. Intermittent cuing required to slow rate of walking speed for safety and attention to task as pt distracted in highly stimulating hallway.  He ambulated to ADL apartment and completed simulated tub/shower transfer stepping over tub wall as he has at home. Completed with min A using grab bars for assist, pt  reports having grab abrs in home shower. Discussed use of shower chair for energy conservation and to reduce fall risk. Seated rest breaks provided in apartment on low soft surface couch with supervision and impulsivity when sitting pre-maturely.  Pt returned to room at end of session. He was unable to immediately recall room number when  provided by therapist. When room number was provided again he was able to problem solve direction to find room with increased time. Pt left seated in recliner at end of session with chair belt alarm on and all needs in reach. Education provided throughout session regarding role of OT, POC, stroke risk factors- controllable and uncontrollable, fall risk and d/c planning.   OT Evaluation Precautions/Restrictions  Precautions Precautions: Fall Precaution Comments: monitor BP; R inattention Restrictions Weight Bearing Restrictions: No General Chart Reviewed: Yes Pain    No/denies pain Home Living/Prior Functioning Home Living Family/patient expects to be discharged to:: Private residence Living Arrangements: Spouse/significant other Available Help at Discharge: Family, Available 24 hours/day Type of Home: House Home Access: Stairs to enter [employer] of Steps: 2 Entrance Stairs-Rails: None Home Layout: One level Bathroom Shower/Tub: Tub/shower unit, Industrial/product designer: Yes  Lives With: Spouse, Son, Daughter IADL History Current License: No(No license but reports he drives) Occupation: Full time employment, On disability Type of Occupation: Laid off during pandemic Prior Function Level of Independence: Independent with basic ADLs, Independent with gait, Independent with homemaking with ambulation  Able to Take Stairs?: Yes Driving: Yes Comments: lost job March Vision Baseline Vision/History: Wears glasses Wears Glasses: Reading only(Has lost glasses, were for reading only) Patient Visual Report:  No change from baseline Perception  Perception: Within Functional Limits Praxis Praxis: Intact Cognition Overall Cognitive Status: Impaired/Different from baseline Arousal/Alertness: Awake/alert Orientation Level: Person;Place;Situation Person: Oriented Place: Oriented Situation: Oriented Year: 2020 Month: July Day of Week: Correct Memory: Impaired Memory Impairment: Decreased short term memory;Decreased recall of new information Decreased Short Term Memory: Verbal basic;Functional basic Immediate Memory Recall: Sock;Blue;Bed Memory Recall Sock: Without Cue Memory Recall Blue: Without Cue Attention: Sustained Sustained Attention: Impaired Awareness: Impaired Awareness Impairment: Anticipatory impairment Problem Solving: Impaired Problem Solving Impairment: Verbal basic;Functional complex Behaviors: Impulsive Safety/Judgment: Impaired Comments: Pt with decreased safety awareness, impulsive and with decreased awareness into deficits and functional implications. Sensation Sensation Light Touch: Appears Intact Proprioception: Appears Intact Coordination Gross Motor Movements are Fluid and Coordinated: No(Mild R hemiparesis) Fine Motor Movements are Fluid and Coordinated: Yes Finger Nose Finger Test: Sightly decreased speed and accuracy with R UE Motor  Motor Motor: Abnormal postural alignment and control;Hemiplegia Motor - Skilled Clinical Observations: mild R hemiparesis Mobility     Trunk/Postural Assessment  Cervical Assessment Cervical Assessment: Exceptions to WFL(Forward head) Thoracic Assessment Thoracic Assessment: Within Functional Limits Lumbar Assessment Lumbar Assessment: Within Functional Limits Postural Control Postural Control: Deficits on evaluation Righting Reactions: slightly delayed Protective Responses: slightly delayed  Balance Balance Balance Assessed: Yes Standardized Balance Assessment Standardized Balance Assessment: Berg Balance  Test Berg Balance Test Sit to Stand: Able to stand without using hands and stabilize independently Standing Unsupported: Able to stand 2 minutes with supervision Sitting with Back Unsupported but Feet Supported on Floor or Stool: Able to sit safely and securely 2 minutes Stand to Sit: Sits independently, has uncontrolled descent Transfers: Able to transfer safely, minor use of hands Standing Unsupported with Eyes Closed: Able to stand 10 seconds with supervision Standing Ubsupported with Feet Together: Able to place feet together independently and stand for 1 minute with supervision From Standing, Reach Forward with Outstretched Arm: Can reach forward >12 cm safely (5") From Standing Position, Pick up Object from Floor: Able to pick up shoe, needs supervision From Standing Position, Turn to Look Behind Over each Shoulder: Looks behind one side only/other side shows less weight shift Turn 360 Degrees: Needs close supervision  or verbal cueing Standing Unsupported, Alternately Place Feet on Step/Stool: Able to complete 4 steps without aid or supervision Standing Unsupported, One Foot in Front: Able to plae foot ahead of the other independently and hold 30 seconds Standing on One Leg: Tries to lift leg/unable to hold 3 seconds but remains standing independently Total Score: 38 Static Sitting Balance Static Sitting - Balance Support: Feet supported;No upper extremity supported Static Sitting - Level of Assistance: 7: Independent Static Sitting - Comment/# of Minutes: Sitting EOB Dynamic Sitting Balance Dynamic Sitting - Balance Support: During functional activity;Feet supported Dynamic Sitting - Level of Assistance: 5: Stand by assistance Sitting balance - Comments: Sitting on tub bench to bathe Static Standing Balance Static Standing - Balance Support: During functional activity;No upper extremity supported Static Standing - Level of Assistance: 5: Stand by assistance;4: Min assist Dynamic  Standing Balance Dynamic Standing - Balance Support: During functional activity;No upper extremity supported Dynamic Standing - Level of Assistance: 4: Min assist;3: Mod assist Dynamic Standing - Comments: Standing to doff clothing Extremity/Trunk Assessment RUE Assessment RUE Assessment: Within Functional Limits LUE Assessment LUE Assessment: Within Functional Limits     Refer to Care Plan for Long Term Goals  Recommendations for other services: Therapeutic Recreation  Stress management and Outing/community reintegration   Discharge Criteria: Patient will be discharged from OT if patient refuses treatment 3 consecutive times without medical reason, if treatment goals not met, if there is a change in medical status, if patient makes no progress towards goals or if patient is discharged from hospital.  The above assessment, treatment plan, treatment alternatives and goals were discussed and mutually agreed upon: by patient  Jermall Isaacson L 06/30/2019, 7:45 AM

## 2019-06-30 NOTE — Progress Notes (Signed)
[facility]    NEPHROLOGY PROGRESS NOTE  SUBJECTIVE: Patient seen and examined.  Has no specific complaints today.  Reports his speech is improving.  Denies headaches, fevers, chills, chest pain, shortness of breath, nausea, vomiting, diarrhea or dysuria.  All other review of systems are negative.     OBJECTIVE:  Vitals:   06/30/19 0535 06/30/19 1345  BP: (!) 166/116 (!) 172/110  Pulse: 78 83  Resp: 18 18  Temp: 97.9 F (36.6 C) 98.6 F (37 C)  SpO2: 100% 96%    Intake/Output Summary (Last 24 hours) at 06/30/2019 1915 Last data filed at 06/30/2019 1700 Gross per 24 hour  Intake 360 ml  Output -  Net 360 ml      General:  AAOx3 NAD HEENT: MMM [place] AT anicteric sclera Neck:  No JVD, no adenopathy CV:  Heart RRR  Lungs:  L/S CTA bilaterally Abd:  abd SNT/ND with normal BS GU:  Bladder non-palpable Extremities:  No LE edema. Skin:  No skin rash  MEDICATIONS:  . aspirin  81 mg Oral Daily  . atorvastatin  40 mg Oral q1800  . carvedilol  25 mg Oral BID WC  . clopidogrel  75 mg Oral Daily  . diltiazem  120 mg Oral Q8H  . heparin injection (subcutaneous)  5,000 Units Subcutaneous Q8H  . hydrALAZINE  50 mg Oral Q8H  . nicotine  14 mg Transdermal Daily  . potassium chloride  20 mEq Oral BID       LABS:   CBC Latest Ref Rng & Units 06/30/2019 06/27/2019 06/26/2019  WBC 4.0 - 10.5 K/uL 9.6 9.5 10.0  Hemoglobin 13.0 - 17.0 g/dL 10.9(L) 11.7(L) 13.1  Hematocrit 39.0 - 52.0 % 32.6(L) 34.2(L) 37.7(L)  Platelets 150 - 400 K/uL 338 301 299    CMP Latest Ref Rng & Units 06/30/2019 06/29/2019 06/28/2019  Glucose 70 - 99 mg/dL 99 108(H) 114(H)  BUN 6 - 20 mg/dL 33(H) 29(H) 28(H)  Creatinine 0.61 - 1.24 mg/dL 6.31(H) 5.88(H) 5.60(H)  Sodium 135 - 145 mmol/L 135 136 135  Potassium 3.5 - 5.1 mmol/L 3.3(L) 3.7 2.9(L)  Chloride 98 - 111 mmol/L 102 104 101  CO2 22 - 32 mmol/L 23 21(L) 23  Calcium 8.9 - 10.3 mg/dL 9.3 9.1 9.1  Total Protein 6.5 - 8.1 g/dL - - -   Total Bilirubin 0.3 - 1.2 mg/dL - - -  Alkaline Phos 38 - 126 U/L - - -  AST 15 - 41 U/L - - -  ALT 0 - 44 U/L - - -    Lab Results  Component Value Date   CALCIUM 9.3 06/30/2019   PHOS 3.7 06/30/2019       Component Value Date/Time   COLORURINE YELLOW 06/22/2019 1800   APPEARANCEUR CLEAR 06/22/2019 1800   LABSPEC 1.012 06/22/2019 1800   PHURINE 5.0 06/22/2019 1800   GLUCOSEU 50 (A) 06/22/2019 1800   HGBUR MODERATE (A) 06/22/2019 1800   BILIRUBINUR NEGATIVE 06/22/2019 1800   KETONESUR NEGATIVE 06/22/2019 1800   PROTEINUR >=300 (A) 06/22/2019 1800   NITRITE NEGATIVE 06/22/2019 1800   LEUKOCYTESUR NEGATIVE 06/22/2019 1800   No results found for: PHART, PCO2ART, PO2ART, HCO3, TCO2, ACIDBASEDEF, O2SAT  No results found for: IRON, TIBC, FERRITIN, IRONPCTSAT     ASSESSMENT/PLAN:    ** AKI:  Cr 1.12 on 12/2017 > 06/21/19 presentation 4.95 and around 5-6. Nonoliguric with 741mL UOP yesterday.   06/23/19 renal US - 10.9 and 10.2cm, echogenic; wedge shaped hyperechoic areas  BL = ddx infarcts vs pyelonephritis; No renal infarcts noted on CT a/p from 06/25/19 (noncontrast).  7/9 UA  +++ proteinuria, + glucose, mod blood, neg LE and nit. Microscopy with 0-5 RBC/hpf, no WBC.  UNa 7/9 <10.  UP/C 2.56.  Will check full serologic work-up.  Suspect acute renal failure secondary to hypertensive emergency.   Suspect AKI due to hypertensive urgency/emergency.   AKI due to hypertension can be slow to improve and many times irreversible.  No indications for RRT at this time, will follow closely.    **  HTN emergency:  Dx with HTN several years ago, no consistent treatment.  MRI 7/9 with BL infarcts (acute in both hemispheres and brainstem), chronic small vessel changes, consider PRES. Adrenal glands normal on 06/25/19 CT.   Currently on hydralazine 50 TID, Diltiazem to 120 q8h, coreg 12.5 BID.  Increase coreg 25 BID.  No RAAS inhibitors now in setting of such low GFR and lack of consistent f/u in the past.    Renin, aldo levels still pending.  Renal artery duplex neg for RAS.    With persistent hypokalemia despite potassium supplementation, will start a low dose of Aldactone.  **Cocaine use: counseled for impact on health esp in setting of HTN.   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  TTE without vegetation.  Per cardiology note, they d/w neuro and not thought to be embolic.  Plan for CIR post acute hospitalization.   **Hypokalemia:   Continues to be low despite repletion.  Per above renin and aldo pending.  We will add Aldactone.  Monitor potassium closely.   [place], DO, [place]

## 2019-06-30 NOTE — Progress Notes (Signed)
Pt's wallet with bank cards and money and pack of cigarettes sent home with pt's spouse.   Gerald Stabs, RN

## 2019-06-30 NOTE — IPOC Note (Signed)
Overall Plan of Care [facility]) Patient Details Name: Quy Lotts MRN: [id number] DOB: 1986/04/05  Admitting Diagnosis: <principal problem not specified>  Hospital Problems: Active Problems:   PRES (posterior reversible encephalopathy syndrome)   Uncontrolled hypertension   Acute kidney injury ([place])     Functional Problem List: Nursing Bladder, Bowel, Endurance, Medication Management, Nutrition, Pain, Safety, Skin Integrity  PT Balance, Safety, Behavior, Endurance, Motor, Perception  OT Balance, Safety, Cognition, Endurance, Motor  SLP Cognition  TR         Basic ADL's: OT Grooming, Bathing, Dressing, Toileting     Advanced  ADL's: OT       Transfers: PT Bed Mobility, Bed to Chair, Car, Furniture, Floor  OT Toilet, Tub/Shower     Locomotion: PT Stairs, Ambulation     Additional Impairments: OT None  SLP Social Cognition   Problem Solving, Memory, Awareness  TR      Anticipated Outcomes Item Anticipated Outcome  Self Feeding Indep  Swallowing      Basic self-care  Mod I  Toileting  Mod I   Bathroom Transfers Supervision- Mod I  Bowel/Bladder  Continent of bladder/bowel with min assist  Transfers  mod I  Locomotion  supervision for stairs & gait in community, mod i for gait in home  Communication  Mod I  Cognition  Min A  Pain  Maintain pain < 3  Safety/Judgment  No reports of falls or injuries   Therapy Plan: PT Intensity: Minimum of 1-2 x/day ,45 to 90 minutes PT Frequency: 5 out of 7 days PT Duration Estimated Length of Stay: 5-7 days OT Intensity: Minimum of 1-2 x/day, 45 to 90 minutes OT Frequency: 5 out of 7 days OT Duration/Estimated Length of Stay: 5-7 days SLP Intensity: Minumum of 1-2 x/day, 30 to 90 minutes SLP Frequency: 3 to 5 out of 7 days SLP Duration/Estimated Length of Stay: 5 to 7 days   Due to the current state of emergency, patients may not be receiving their 3-hours of Medicare-mandated therapy.   Team  Interventions: Nursing Interventions Patient/Family Education, Bladder Management, Bowel Management, Disease Management/Prevention, Pain Management, Medication Management, Cognitive Remediation/Compensation, Discharge Planning, Psychosocial Support  PT interventions Ambulation/gait training, Community reintegration, DME/adaptive equipment instruction, Neuromuscular re-education, Stair training, Psychosocial support, UE/LE Strength taining/ROM, Wheelchair propulsion/positioning, UE/LE Coordination activities, Therapeutic Activities, Skin care/wound management, Pain management, Functional electrical stimulation, Discharge planning, Balance/vestibular training, Disease management/prevention, Functional mobility training, Patient/family education, Splinting/orthotics, Therapeutic Exercise, Visual/perceptual remediation/compensation, Cognitive remediation/compensation  OT Interventions Balance/vestibular training, Discharge planning, Pain management, Self Care/advanced ADL retraining, Therapeutic Activities, UE/LE Coordination activities, Therapeutic Exercise, Patient/family education, Functional mobility training, Disease mangement/prevention, Cognitive remediation/compensation, Community reintegration, Engineer, drilling, Neuromuscular re-education, Psychosocial support, UE/LE Strength taining/ROM  SLP Interventions Internal/external aids, Patient/family education, Cognitive remediation/compensation  TR Interventions    SW/CM Interventions Discharge Planning, Psychosocial Support, Patient/Family Education   Barriers to Discharge MD  Medical stability  Nursing      PT      OT Medication compliance Pt admited as a result of not taking BP medications  SLP      SW       Team Discharge Planning: Destination: PT-Home ,OT- Home , SLP-Home Projected Follow-up: PT-Outpatient PT, OT-  Home health OT, SLP-Outpatient SLP, Home Health SLP, 24 hour supervision/assistance Projected Equipment  Needs: PT-To be determined, OT- To be determined, SLP-None recommended by SLP Equipment Details: PT- , OT-  Patient/family involved in discharge planning: PT- Patient,  OT-Patient, SLP-Patient  MD ELOS: 5-7 days Medical Rehab  Prognosis:  Excellent Assessment: The patient has been admitted for CIR therapies with the diagnosis of PRES. The team will be addressing functional mobility, strength, stamina, balance, safety, adaptive techniques and equipment, self-care, bowel and bladder mgt, patient and caregiver education, NMR, cognition, community reentry. Goals have been set at mod I to supervision with self-care and mobility, min assist with cognition.    Due to the current state of emergency, patients may not be receiving their 3 hours per day of Medicare-mandated therapy.    Meredith Staggers, MD, FAAPMR      See Team Conference Notes for weekly updates to the plan of care

## 2019-07-01 ENCOUNTER — Inpatient Hospital Stay (HOSPITAL_COMMUNITY): Payer: Self-pay | Admitting: Speech Pathology

## 2019-07-01 ENCOUNTER — Inpatient Hospital Stay (HOSPITAL_COMMUNITY): Payer: Self-pay | Admitting: Occupational Therapy

## 2019-07-01 ENCOUNTER — Inpatient Hospital Stay (HOSPITAL_COMMUNITY): Payer: Self-pay

## 2019-07-01 DIAGNOSIS — G8194 Hemiplegia, unspecified affecting left nondominant side: Secondary | ICD-10-CM

## 2019-07-01 LAB — RENAL FUNCTION PANEL
Albumin: 3.1 g/dL — ABNORMAL LOW (ref 3.5–5.0)
Anion gap: 10 (ref 5–15)
BUN: 39 mg/dL — ABNORMAL HIGH (ref 6–20)
CO2: 24 mmol/L (ref 22–32)
Calcium: 9.2 mg/dL (ref 8.9–10.3)
Chloride: 103 mmol/L (ref 98–111)
Creatinine, Ser: 6.63 mg/dL — ABNORMAL HIGH (ref 0.61–1.24)
GFR calc Af Amer: 12 mL/min — ABNORMAL LOW (ref >60)
GFR calc non Af Amer: 10 mL/min — ABNORMAL LOW (ref >60)
Glucose, Bld: 111 mg/dL — ABNORMAL HIGH (ref 70–99)
Phosphorus: 3.5 mg/dL (ref 2.5–4.6)
Potassium: 3.4 mmol/L — ABNORMAL LOW (ref 3.5–5.1)
Sodium: 137 mmol/L (ref 135–145)

## 2019-07-01 LAB — HIV ANTIBODY (ROUTINE TESTING W REFLEX): HIV Screen 4th Generation wRfx: NONREACTIVE

## 2019-07-01 NOTE — Progress Notes (Signed)
Speech Language Pathology Daily Session Note  Patient Details  Name: Kasey Ewings MRN: [id number] Date of Birth: 1986/05/18  Today's Date: 07/01/2019 SLP Individual Time: 0800-0840 SLP Individual Time Calculation (min): 40 min  Short Term Goals: Week 1: SLP Short Term Goal 1 (Week 1): STGs = LTGs d/t short ELOS  Skilled Therapeutic Interventions: Skilled treatment session focused on cognitive goals. SLP facilitated session by providing total A verbal cues for recall of his current medications and their functions and overall Min A verbal cues for problem solving while organizing a 4 time per day pill box. Patient also was ~90% intelligible at the sentence level with Mod verbal cues for use of a decreased speech rate and over-articulation.  Patient left upright in bed with alarm on and all needs within reach. Continue with current plan of care.      Pain No/Denies Pain   Therapy/Group: Individual Therapy  Rebeckah Masih 07/01/2019, 12:22 PM

## 2019-07-01 NOTE — Progress Notes (Signed)
Physical Therapy Session Note  Patient Details  Name: William [name] MRN: [id number] Date of Birth: 06/08/1986  Today's Date: 07/01/2019 PT Individual Time: 1435-1535 PT Individual Time Calculation (min): 60 min   Short Term Goals: Week 1:  PT Short Term Goal 1 (Week 1): STG = LTG due to short ELOS.  Skilled Therapeutic Interventions/Progress Updates:     Patient in recliner in room upon PT arrival. Patient alert and agreeable to PT session.  Therapeutic Activity: Transfers: Patient performed sit to/from stand x7 with supervision for safety without an AD.   Gait Training:  Patient ambulated 100 feet x2 AND 150 feet x1 using no AD with supervision for safety. Ambulated with mild decreased gait speed, decreased step length on L, posterior trunk lean, and intermittent variable foot placement. Provided verbal cues for erect posture, increased gait speed for reduced fall risk, and intentional and equal steps.  Neuromuscular Re-ed: Patient performed Function Gait Assessment, see details below. Score 23/30. Then preceded to work on dynamic gait including tandem walking on colored tap on the floor for 80' x2, backwards walking 20 feet x2, patient with increased posterior lean and decreased step length with posterior LOB x2 on first trial, cues provided for activation of abdominal muscles to bring trunk to erect posture and increased step length for improved balance, no LOB on second trial. Went up/down 10 steps with reciprocal gait in the stair well, initially no holding on to rails with LOB x2 due to poor foot clearance on L. Cued patient to hold onto the rail and control steps with no LOB on second trial. Performed Limits of Stability training on Biodex set to level 8 with B UE support x1 for practice trial, no UE support x3 with CGA-supervision for safety/balance. Trial 1: 2 min 3 sec 11%, Trial 2: 1 min 58 sec 12%, and Trial 3: 2 min 20 sec 13%. Patient stated he enjoyed the challenge of this  activity and felt that it worked his ankles well.   Therapeutic Exercise: Patient performed the following exercises with verbal and tactile cues for proper technique and controlled motion for increased muscle control. -B marching with 10 lb ankle weights 2x10 -B LAQ with 10 lb ankle wieghts 2x10  10 lb ankle weights left in room for patient to perform exercises in room independently   Patient in recliner at end of session with breaks locked, chair alarm set, and all needs within reach.   Therapy Documentation Precautions:  Precautions Precautions: Fall Precaution Comments: monitor BP; R inattention Restrictions Weight Bearing Restrictions: No Pain: Patient denied pain throughout session.     Balance: Balance Balance Assessed: Yes Standardized Balance Assessment Standardized Balance Assessment: Functional Gait Assessment Functional Gait  Assessment Gait assessed : Yes Gait Level Surface: Walks 20 ft in less than 5.5 sec, no assistive devices, good speed, no evidence for imbalance, normal gait pattern, deviates no more than 6 in outside of the 12 in walkway width. Change in Gait Speed: Able to smoothly change walking speed without loss of balance or gait deviation. Deviate no more than 6 in outside of the 12 in walkway width. Gait with Horizontal Head Turns: Performs head turns smoothly with no change in gait. Deviates no more than 6 in outside 12 in walkway width Gait with Vertical Head Turns: Performs head turns with no change in gait. Deviates no more than 6 in outside 12 in walkway width. Gait and Pivot Turn: Pivot turns safely within 3 sec and stops quickly with no loss  of balance. Step Over Obstacle: Is able to step over 2 stacked shoe boxes taped together (9 in total height) without changing gait speed. No evidence of imbalance. Gait with Narrow Base of Support: Ambulates less than 4 steps heel to toe or cannot perform without assistance. Gait with Eyes Closed: Walks 20 ft,  uses assistive device, slower speed, mild gait deviations, deviates 6-10 in outside 12 in walkway width. Ambulates 20 ft in less than 9 sec but greater than 7 sec. Ambulating Backwards: Walks 20 ft, slow speed, abnormal gait pattern, evidence for imbalance, deviates 10-15 in outside 12 in walkway width. Steps: Alternating feet, must use rail. Total Score: 23    Therapy/Group: Individual Therapy  Memori William [name] PT, DPT  07/01/2019, 4:58 PM

## 2019-07-01 NOTE — Progress Notes (Signed)
[facility]    NEPHROLOGY PROGRESS NOTE  SUBJECTIVE: Patient seen and examined.  Has no specific complaints today.  Reports his speech is improving.  Denies headaches, fevers, chills, chest pain, shortness of breath, nausea, vomiting, diarrhea or dysuria.  All other review of systems are negative.     OBJECTIVE:  Vitals:   06/30/19 1927 07/01/19 0414  BP: (!) 163/111 (!) 157/104  Pulse: 81 78  Resp: 20 20  Temp: 98.2 F (36.8 C) 97.6 F (36.4 C)  SpO2: 100% 98%    Intake/Output Summary (Last 24 hours) at 07/01/2019 1130 Last data filed at 06/30/2019 1700 Gross per 24 hour  Intake 240 ml  Output -  Net 240 ml      General:  AAOx3 NAD HEENT: MMM [place] AT anicteric sclera Neck:  No JVD, no adenopathy CV:  Heart RRR  Lungs:  L/S CTA bilaterally Abd:  abd SNT/ND with normal BS GU:  Bladder non-palpable Extremities:  No LE edema. Skin:  No skin rash  MEDICATIONS:  . aspirin  81 mg Oral Daily  . atorvastatin  40 mg Oral q1800  . carvedilol  25 mg Oral BID WC  . clopidogrel  75 mg Oral Daily  . diltiazem  120 mg Oral Q8H  . heparin injection (subcutaneous)  5,000 Units Subcutaneous Q8H  . hydrALAZINE  50 mg Oral Q8H  . nicotine  14 mg Transdermal Daily  . spironolactone  25 mg Oral Daily       LABS:   CBC Latest Ref Rng & Units 06/30/2019 06/27/2019 06/26/2019  WBC 4.0 - 10.5 K/uL 9.6 9.5 10.0  Hemoglobin 13.0 - 17.0 g/dL 10.9(L) 11.7(L) 13.1  Hematocrit 39.0 - 52.0 % 32.6(L) 34.2(L) 37.7(L)  Platelets 150 - 400 K/uL 338 301 299    CMP Latest Ref Rng & Units 07/01/2019 06/30/2019 06/29/2019  Glucose 70 - 99 mg/dL 111(H) 99 108(H)  BUN 6 - 20 mg/dL 39(H) 33(H) 29(H)  Creatinine 0.61 - 1.24 mg/dL 6.63(H) 6.31(H) 5.88(H)  Sodium 135 - 145 mmol/L 137 135 136  Potassium 3.5 - 5.1 mmol/L 3.4(L) 3.3(L) 3.7  Chloride 98 - 111 mmol/L 103 102 104  CO2 22 - 32 mmol/L 24 23 21(L)  Calcium 8.9 - 10.3 mg/dL 9.2 9.3 9.1  Total Protein 6.5 - 8.1 g/dL - - -   Total Bilirubin 0.3 - 1.2 mg/dL - - -  Alkaline Phos 38 - 126 U/L - - -  AST 15 - 41 U/L - - -  ALT 0 - 44 U/L - - -    Lab Results  Component Value Date   CALCIUM 9.2 07/01/2019   PHOS 3.5 07/01/2019       Component Value Date/Time   COLORURINE YELLOW 06/22/2019 1800   APPEARANCEUR CLEAR 06/22/2019 1800   LABSPEC 1.012 06/22/2019 1800   PHURINE 5.0 06/22/2019 1800   GLUCOSEU 50 (A) 06/22/2019 1800   HGBUR MODERATE (A) 06/22/2019 1800   BILIRUBINUR NEGATIVE 06/22/2019 1800   KETONESUR NEGATIVE 06/22/2019 1800   PROTEINUR >=300 (A) 06/22/2019 1800   NITRITE NEGATIVE 06/22/2019 1800   LEUKOCYTESUR NEGATIVE 06/22/2019 1800   No results found for: PHART, PCO2ART, PO2ART, HCO3, TCO2, ACIDBASEDEF, O2SAT  No results found for: IRON, TIBC, FERRITIN, IRONPCTSAT     ASSESSMENT/PLAN:    ** AKI:  Cr 1.12 on 12/2017 > 06/21/19 presentation 4.95 and around 5-6. Nonoliguric with 755mL UOP yesterday.   06/23/19 renal US - 10.9 and 10.2cm, echogenic; wedge shaped hyperechoic areas BL =  ddx infarcts vs pyelonephritis; No renal infarcts noted on CT a/p from 06/25/19 (noncontrast).  7/9 UA  +++ proteinuria, + glucose, mod blood, neg LE and nit. Microscopy with 0-5 RBC/hpf, no WBC.  UNa 7/9 <10.  UP/C 2.56.  Will check full serologic work-up.  Suspect acute renal failure secondary to hypertensive emergency.   Suspect AKI due to hypertensive urgency/emergency.   AKI due to hypertension can be slow to improve and many times irreversible.  No indications for RRT at this time, will follow closely.    **  HTN emergency:  Dx with HTN several years ago, no consistent treatment.  MRI 7/9 with BL infarcts (acute in both hemispheres and brainstem), chronic small vessel changes, consider PRES. Adrenal glands normal on 06/25/19 CT.   Currently on hydralazine 50 TID, Diltiazem to 120 q8h, coreg 12.5 BID.  Increase coreg 25 BID.  No RAAS inhibitors now in setting of such low GFR and lack of consistent f/u in the past.    Renin, aldo levels still pending.  Renal artery duplex neg for RAS.    With persistent hypokalemia despite potassium supplementation, started low dose of Aldactone.  Potassium supplements discontinued and will likely need to uptitrate Aldactone.  Blood pressure stable today.  **Cocaine use: counseled for impact on health esp in setting of HTN.   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  TTE without vegetation.  Per cardiology note, they d/w neuro and not thought to be embolic.  Plan for CIR post acute hospitalization.   **Hypokalemia:   Continued to be low despite repletion.  Per above renin and aldo pending.  Added Aldactone on 06/30/2019.  Monitor potassium closely.   [place], DO, [place]

## 2019-07-01 NOTE — Plan of Care (Signed)
  Problem: Consults Goal: RH STROKE PATIENT EDUCATION Description: See Patient Education module for education specifics  Outcome: Progressing   Problem: RH BOWEL ELIMINATION Goal: RH STG MANAGE BOWEL WITH ASSISTANCE Description: STG Manage Bowel with mod I Assistance. Outcome: Progressing   Problem: RH SAFETY Goal: RH STG ADHERE TO SAFETY PRECAUTIONS W/ASSISTANCE/DEVICE Description: STG Adhere to Safety Precautions With supervision/cues Assistance/Device. Outcome: Progressing   Problem: RH PAIN MANAGEMENT Goal: RH STG PAIN MANAGED AT OR BELOW PT'S PAIN GOAL Description: Pain less than 3 on scale of 0-10 Outcome: Progressing   Problem: RH KNOWLEDGE DEFICIT Goal: RH STG INCREASE KNOWLEDGE OF HYPERTENSION Description: Pt will be able to demonstrate understanding of hypertension medications and defined blood pressure parameters. Pt will demonstrate understanding diet restrictions and modifications for blood pressure control using handout provided with cues and reminder assist.  Outcome: Progressing Goal: RH STG INCREASE KNOWLEGDE OF HYPERLIPIDEMIA Description: Pt will be able to demonstrate understanding of antihyperlipidemic medications. Pt will demonstrate understanding diet restrictions and modifications for cholesterol control using handout provided with cues and reminder assist.  Outcome: Progressing

## 2019-07-01 NOTE — Progress Notes (Signed)
[place] PHYSICAL MEDICINE & REHABILITATION PROGRESS NOTE   Subjective/Complaints: No new issues overnite, first therapy session went well remains mildly dysarthric  ROS: Patient denies, nausea, vomiting, diarrhea, cough, shortness of breath or chest pain,  Objective:   No results found. Recent Labs    06/30/19 0558  WBC 9.6  HGB 10.9*  HCT 32.6*  PLT 338   Recent Labs    06/30/19 0558 07/01/19 0546  NA 135 137  K 3.3* 3.4*  CL 102 103  CO2 23 24  GLUCOSE 99 111*  BUN 33* 39*  CREATININE 6.31* 6.63*  CALCIUM 9.3 9.2    Intake/Output Summary (Last 24 hours) at 07/01/2019 0911 Last data filed at 06/30/2019 1700 Gross per 24 hour  Intake 240 ml  Output -  Net 240 ml     Physical Exam: Vital Signs Blood pressure (!) 157/104, pulse 78, temperature 97.6 F (36.4 C), resp. rate 20, height 5\' 9"  (1.753 m), weight 83.6 kg, SpO2 98 %. Constitutional: No distress . Vital signs reviewed. HEENT: EOMI, oral membranes moist Neck: supple Cardiovascular: RRR without murmur. No JVD    Respiratory: CTA Bilaterally without wheezes or rales. Normal effort    GI: BS +, non-tender, non-distended  Musculoskeletal:  General: No tendernessor edema.  Neurological: fair insight, a little distracted. He isalertand oriented to person, place, and time. He displaysnormal reflexes. He exhibitsnormal muscle tone. Pt with reasonable insight and awareness. No focal CN abnl. RUE 4+/5. LUE remains 4/5. B/L LE 4+/5.  No dysmetria or fin motor noted on exam  Psychiatric:  Pleasant, anxious   Assessment/Plan: 1. Functional deficits secondary to PRES which require 3+ hours per day of interdisciplinary therapy in a comprehensive inpatient rehab setting.  Physiatrist is providing close team supervision and 24 hour management of active medical problems listed below.  Physiatrist and rehab team continue to assess barriers to discharge/monitor patient progress toward functional and  medical goals  Care Tool:  Bathing    Body parts bathed by patient: Right arm, Left arm, Abdomen, Chest, Front perineal area, Buttocks, Right upper leg, Left upper leg, Right lower leg, Left lower leg, Face         Bathing assist Assist Level: Minimal Assistance - Patient > 75%     Upper Body Dressing/Undressing Upper body dressing   What is the patient wearing?: Pull over shirt    Upper body assist Assist Level: Supervision/Verbal cueing    Lower Body Dressing/Undressing Lower body dressing      What is the patient wearing?: Underwear/pull up, Pants     Lower body assist Assist for lower body dressing: Minimal Assistance - Patient > 75%     Toileting Toileting    Toileting assist Assist for toileting: Supervision/Verbal cueing     Transfers Chair/bed transfer  Transfers assist     Chair/bed transfer assist level: Minimal Assistance - Patient > 75%     Locomotion Ambulation   Ambulation assist      Assist level: Supervision/Verbal cueing Assistive device: No Device Max distance: 200 ft   Walk 10 feet activity   Assist     Assist level: Supervision/Verbal cueing Assistive device: No Device   Walk 50 feet activity   Assist    Assist level: Supervision/Verbal cueing Assistive device: No Device    Walk 150 feet activity   Assist    Assist level: Supervision/Verbal cueing Assistive device: No Device    Walk 10 feet on uneven surface  activity   Assist  Assist level: Supervision/Verbal cueing Assistive device: (none)   Wheelchair     Assist Will patient use wheelchair at discharge?: No   Wheelchair activity did not occur: N/A(pt ambulatory, supervision without AD, will d/c at ambulatory level)         Wheelchair 50 feet with 2 turns activity    Assist    Wheelchair 50 feet with 2 turns activity did not occur: N/A(pt ambulatory, supervision without AD, will d/c at ambulatory level)       Wheelchair 150  feet activity     Assist Wheelchair 150 feet activity did not occur: N/A(pt ambulatory, supervision without AD, will d/c at ambulatory level)        Medical Problem List and Plan: 1.Functional and cognitive deficits and left hemiparesissecondary to scattered bi-cerebral infarcts/PRES -Cont  CIR therapies today including PT, OT, and SLP  2. Antithrombotics: -DVT/anticoagulation:Pharmaceutical:Lovenox -antiplatelet therapy: ContinueASA/Plavix(added 7/16) X 3 weeks followed by ASA alone. 3. Pain Management:Tylenol as needed 4. Mood:LCSW to follow for evaluation and support -antipsychotic agents: N/A 5. Neuropsych: This patientiscapable of making decisions on own behalf. 6. Skin/Wound Care:Routine pressure relief measures 7. Fluids/Electrolytes/Nutrition:Monitor I's and O's. Check lites in a.m. 8 . FEO:FHQRFXJ blood pressures 3 times daily. Continue coreg, Cardizem, hydralazine -bp elevated again this morning -coreg was just increased to 25mg  bid 7/16   -nephrology following, dopplers negative for RAS  -no RAAS inhibitors d/t #9 9.Acute kidney injury:  Cr continues to climb  -nephrology following, defer to their recs 10. Persistent hypokalemia: potassium 3.4 today  -replace as per renal   - Renin/aldosterone levels still pending. 11. Hx of polysubstance abuse: counseling as appropriate     LOS: 2 days A FACE TO FACE EVALUATION WAS PERFORMED  Charlett Blake 07/01/2019, 9:11 AM

## 2019-07-01 NOTE — Progress Notes (Addendum)
Occupational Therapy Session Note  Patient Details  Name: William [name] MRN: [id number] Date of Birth: [birth year]  Today's Date: 07/01/2019 OT Individual Time: 1033-1200 OT Individual Time Calculation (min): 87 min   Short Term Goals: Week 1:  OT Short Term Goal 1 (Week 1): STG=LTG due to LOS  Skilled Therapeutic Interventions/Progress Updates:    Pt greeted EOB with no c/o pain. Wanting to take a shower. After ambulating in room to gather needed items, he completed bathing (sit<stand from TTB), dressing (sit<stand from standard toilet), oral care/grooming tasks (standing at sink), bedmaking (bed elevated to height at home), vacuuming (in ADL apartment), simulated meal prep (in ADL kitchen), and TTB transfers (in tub shower room) during session. All functional transfers completed without device and supervision assist. He had a few small balance loses during dual task of walking while conversing with OT, mostly during turns. Able to self correct unassisted. Pt mindful of integrating L UE during all functional tasks with min vcs. He used affected limb at nondominant-dominant level throughout. Supervision for dynamic standing balance and ambulation, including vacuuming. He stooped several times to floor during session with unilateral support on stable surfaces, requiring supervision assist. Discussed slowing pace and limiting distractions during functional ambulation at home to decrease fall risk. He will need a TTB for home. Pt also ambulated over mulch in ortho gym with vcs for slowing pace. Pts ambulation distance was approx 1320 ft during session. He returned to room and was left in recliner with all needs within reach and chair alarm set.   Therapy Documentation Precautions:  Precautions Precautions: Fall Precaution Comments: monitor BP; R inattention Restrictions Weight Bearing Restrictions: No Vital Signs: Therapy Vitals Temp: (!) 97.5 F (36.4 C) Temp Source: Oral Pulse Rate:  75 Resp: 18 BP: (!) 160/106 Patient Position (if appropriate): Sitting Oxygen Therapy SpO2: 99 % O2 Device: Room Air ADL:       Therapy/Group: Individual Therapy  Cheynne Virden A Julia Kulzer 07/01/2019, 4:09 PM

## 2019-07-02 ENCOUNTER — Inpatient Hospital Stay (HOSPITAL_COMMUNITY): Payer: Self-pay | Admitting: Speech Pathology

## 2019-07-02 LAB — RENAL FUNCTION PANEL
Albumin: 3.2 g/dL — ABNORMAL LOW (ref 3.5–5.0)
Anion gap: 10 (ref 5–15)
BUN: 38 mg/dL — ABNORMAL HIGH (ref 6–20)
CO2: 23 mmol/L (ref 22–32)
Calcium: 9.2 mg/dL (ref 8.9–10.3)
Chloride: 103 mmol/L (ref 98–111)
Creatinine, Ser: 6.91 mg/dL — ABNORMAL HIGH (ref 0.61–1.24)
GFR calc Af Amer: 11 mL/min — ABNORMAL LOW (ref >60)
GFR calc non Af Amer: 9 mL/min — ABNORMAL LOW (ref >60)
Glucose, Bld: 110 mg/dL — ABNORMAL HIGH (ref 70–99)
Phosphorus: 4.2 mg/dL (ref 2.5–4.6)
Potassium: 3.5 mmol/L (ref 3.5–5.1)
Sodium: 136 mmol/L (ref 135–145)

## 2019-07-02 LAB — C4 COMPLEMENT: Complement C4, Body Fluid: 37 mg/dL (ref 14–44)

## 2019-07-02 LAB — C3 COMPLEMENT: C3 Complement: 136 mg/dL (ref 82–167)

## 2019-07-02 NOTE — Progress Notes (Addendum)
[facility] PHYSICAL MEDICINE & REHABILITATION PROGRESS NOTE   Subjective/Complaints: Fatigued after therapy yesterday   ROS: Patient denies, nausea, vomiting, diarrhea, cough, shortness of breath or chest pain,  Objective:   No results found. Recent Labs    06/30/19 0558  WBC 9.6  HGB 10.9*  HCT 32.6*  PLT 338   Recent Labs    07/01/19 0546 07/02/19 0626  NA 137 136  K 3.4* 3.5  CL 103 103  CO2 24 23  GLUCOSE 111* 110*  BUN 39* 38*  CREATININE 6.63* 6.91*  CALCIUM 9.2 9.2    Intake/Output Summary (Last 24 hours) at 07/02/2019 0912 Last data filed at 07/02/2019 0700 Gross per 24 hour  Intake 480 ml  Output 600 ml  Net -120 ml     Physical Exam: Vital Signs Blood pressure (!) 145/98, pulse 81, temperature 98.1 F (36.7 C), resp. rate 18, height 5\' 9"  (1.753 m), weight 84.3 kg, SpO2 95 %. Constitutional: No distress . Vital signs reviewed. HEENT: EOMI, oral membranes moist Neck: supple Cardiovascular: RRR without murmur. No JVD    Respiratory: CTA Bilaterally without wheezes or rales. Normal effort    GI: BS +, non-tender, non-distended  Musculoskeletal:  General: No tendernessor edema.  Neurological: fair insight, a little distracted. He isalertand oriented to person, place, and time. He displaysnormal reflexes. He exhibitsnormal muscle tone. Pt with reasonable insight and awareness. No focal CN abnl. RUE 4+/5. LUE remains 4/5. B/L LE 4+/5.  No dysmetria or fin motor noted on exam  Psychiatric:  Pleasant, anxious   Assessment/Plan: 1. Functional deficits secondary to PRES which require 3+ hours per day of interdisciplinary therapy in a comprehensive inpatient rehab setting.  Physiatrist is providing close team supervision and 24 hour management of active medical problems listed below.  Physiatrist and rehab team continue to assess barriers to discharge/monitor patient progress toward functional and medical goals  Care Tool:  Bathing     Body parts bathed by patient: Right arm, Left arm, Abdomen, Chest, Front perineal area, Buttocks, Right upper leg, Left upper leg, Right lower leg, Left lower leg, Face         Bathing assist Assist Level: Supervision/Verbal cueing     Upper Body Dressing/Undressing Upper body dressing   What is the patient wearing?: Pull over shirt    Upper body assist Assist Level: Set up assist    Lower Body Dressing/Undressing Lower body dressing      What is the patient wearing?: Underwear/pull up, Pants     Lower body assist Assist for lower body dressing: Supervision/Verbal cueing     Toileting Toileting    Toileting assist Assist for toileting: Independent with assistive device Assistive Device Comment: urinal   Transfers Chair/bed transfer  Transfers assist     Chair/bed transfer assist level: Set up assist     Locomotion Ambulation   Ambulation assist      Assist level: Supervision/Verbal cueing Assistive device: No Device Max distance: 150'   Walk 10 feet activity   Assist     Assist level: Supervision/Verbal cueing Assistive device: No Device   Walk 50 feet activity   Assist    Assist level: Supervision/Verbal cueing Assistive device: No Device    Walk 150 feet activity   Assist    Assist level: Supervision/Verbal cueing Assistive device: No Device    Walk 10 feet on uneven surface  activity   Assist     Assist level: Supervision/Verbal cueing Assistive device: (none)   Wheelchair  Assist Will patient use wheelchair at discharge?: No   Wheelchair activity did not occur: N/A(pt ambulatory, supervision without AD, will d/c at ambulatory level)         Wheelchair 50 feet with 2 turns activity    Assist    Wheelchair 50 feet with 2 turns activity did not occur: N/A(pt ambulatory, supervision without AD, will d/c at ambulatory level)       Wheelchair 150 feet activity     Assist Wheelchair 150 feet  activity did not occur: N/A(pt ambulatory, supervision without AD, will d/c at ambulatory level)        Medical Problem List and Plan: 1.Functional and cognitive deficits and left hemiparesissecondary to scattered bi-cerebral infarcts/PRES -Cont  CIR therapies today including PT, OT, and SLP  Doing well from CVA standpoint, should be ready for D/C this week but if creat cont to rise may need renal biopsy and transfer to acute  ( as discussed with Dr Grayland Ormond, nephro) 2. Antithrombotics: -DVT/anticoagulation:Pharmaceutical:Lovenox -antiplatelet therapy: ContinueASA/Plavix(added 7/16) X 3 weeks followed by ASA alone. 3. Pain Management:Tylenol as needed 4. Mood:LCSW to follow for evaluation and support -antipsychotic agents: N/A 5. Neuropsych: This patientiscapable of making decisions on own behalf. 6. Skin/Wound Care:Routine pressure relief measures 7. Fluids/Electrolytes/Nutrition:Monitor I's and O's. Check lites in a.m. 8 . JKD:TOIZTIW blood pressures 3 times daily. Continue coreg, Cardizem, hydralazine -bp elevated again this morning -coreg was just increased to 25mg  bid 7/16   -nephrology following, dopplers negative for RAS  -no RAAS inhibitors d/t #9 9.Acute kidney injury:  Cr elevatedgoing up,  -nephrology following, defer to their recs 10. Persistent hypokalemia: potassium 3.4 today  -replace as per renal , using aldactone as well   - Renin/aldosterone levels still pending. 11. Hx of polysubstance abuse: counseling as appropriate Neuropsych consult     LOS: 3 days A FACE TO FACE EVALUATION WAS PERFORMED  Charlett Blake 07/02/2019, 9:12 AM

## 2019-07-02 NOTE — Progress Notes (Signed)
[facility]    NEPHROLOGY PROGRESS NOTE  SUBJECTIVE: Patient seen and examined.  Has no specific complaints today.  Reports his speech is improving.  Is working well with physical therapy.  Denies headaches, fevers, chills, chest pain, shortness of breath, nausea, vomiting, diarrhea or dysuria.  All other review of systems are negative.   OBJECTIVE:  Vitals:   07/01/19 1930 07/02/19 0528  BP: (!) 141/88 (!) 145/98  Pulse: 79 81  Resp: 16 18  Temp: 98.3 F (36.8 C) 98.1 F (36.7 C)  SpO2: 97% 95%    Intake/Output Summary (Last 24 hours) at 07/02/2019 1151 Last data filed at 07/02/2019 0700 Gross per 24 hour  Intake 480 ml  Output 600 ml  Net -120 ml      General:  AAOx3 NAD HEENT: MMM [place] AT anicteric sclera Neck:  No JVD, no adenopathy CV:  Heart RRR  Lungs:  L/S CTA bilaterally Abd:  abd SNT/ND with normal BS GU:  Bladder non-palpable Extremities:  No LE edema. Skin:  No skin rash  MEDICATIONS:  . aspirin  81 mg Oral Daily  . atorvastatin  40 mg Oral q1800  . carvedilol  25 mg Oral BID WC  . clopidogrel  75 mg Oral Daily  . diltiazem  120 mg Oral Q8H  . heparin injection (subcutaneous)  5,000 Units Subcutaneous Q8H  . hydrALAZINE  50 mg Oral Q8H  . nicotine  14 mg Transdermal Daily  . spironolactone  25 mg Oral Daily       LABS:   CBC Latest Ref Rng & Units 06/30/2019 06/27/2019 06/26/2019  WBC 4.0 - 10.5 K/uL 9.6 9.5 10.0  Hemoglobin 13.0 - 17.0 g/dL 10.9(L) 11.7(L) 13.1  Hematocrit 39.0 - 52.0 % 32.6(L) 34.2(L) 37.7(L)  Platelets 150 - 400 K/uL 338 301 299    CMP Latest Ref Rng & Units 07/02/2019 07/01/2019 06/30/2019  Glucose 70 - 99 mg/dL 110(H) 111(H) 99  BUN 6 - 20 mg/dL 38(H) 39(H) 33(H)  Creatinine 0.61 - 1.24 mg/dL 6.91(H) 6.63(H) 6.31(H)  Sodium 135 - 145 mmol/L 136 137 135  Potassium 3.5 - 5.1 mmol/L 3.5 3.4(L) 3.3(L)  Chloride 98 - 111 mmol/L 103 103 102  CO2 22 - 32 mmol/L 23 24 23   Calcium 8.9 - 10.3 mg/dL 9.2 9.2 9.3  Total  Protein 6.5 - 8.1 g/dL - - -  Total Bilirubin 0.3 - 1.2 mg/dL - - -  Alkaline Phos 38 - 126 U/L - - -  AST 15 - 41 U/L - - -  ALT 0 - 44 U/L - - -    Lab Results  Component Value Date   CALCIUM 9.2 07/02/2019   PHOS 4.2 07/02/2019       Component Value Date/Time   COLORURINE YELLOW 06/22/2019 1800   APPEARANCEUR CLEAR 06/22/2019 1800   LABSPEC 1.012 06/22/2019 1800   PHURINE 5.0 06/22/2019 1800   GLUCOSEU 50 (A) 06/22/2019 1800   HGBUR MODERATE (A) 06/22/2019 1800   BILIRUBINUR NEGATIVE 06/22/2019 1800   KETONESUR NEGATIVE 06/22/2019 1800   PROTEINUR >=300 (A) 06/22/2019 1800   NITRITE NEGATIVE 06/22/2019 1800   LEUKOCYTESUR NEGATIVE 06/22/2019 1800   No results found for: PHART, PCO2ART, PO2ART, HCO3, TCO2, ACIDBASEDEF, O2SAT  No results found for: IRON, TIBC, FERRITIN, IRONPCTSAT     ASSESSMENT/PLAN:    ** AKI:  Cr 1.12 on 12/2017 > 06/21/19 presentation 4.95 and around 5-6. Nonoliguric with 765mL UOP yesterday.   06/23/19 renal US - 10.9 and 10.2cm,  echogenic; wedge shaped hyperechoic areas BL = ddx infarcts vs pyelonephritis; No renal infarcts noted on CT a/p from 06/25/19 (noncontrast).  7/9 UA  +++ proteinuria, + glucose, mod blood, neg LE and nit. Microscopy with 0-5 RBC/hpf, no WBC.  UNa 7/9 <10.  UP/C 2.56.  Serologic work-up is pending.  Suspect acute renal failure secondary to hypertensive emergency.   Creatinine continues to worsen.  May ultimately need renal biopsy if creatinine does not plateau or improve.  Discussed with patient.  Monitor daily labs.  Suspect AKI due to hypertensive urgency/emergency.   AKI due to hypertension can be slow to improve and many times irreversible.  No indications for RRT at this time, will follow closely.    **  HTN emergency:  Dx with HTN several years ago, no consistent treatment.  MRI 7/9 with BL infarcts (acute in both hemispheres and brainstem), chronic small vessel changes, consider PRES. Adrenal glands normal on 06/25/19 CT.    Currently on hydralazine 50 TID, Diltiazem to 120 q8h, coreg 12.5 BID.  Increase coreg 25 BID.  No RAAS inhibitors now in setting of such low GFR and lack of consistent f/u in the past.   Renin, aldo levels still pending.  Renal artery duplex neg for RAS.    With persistent hypokalemia despite potassium supplementation, started low dose of Aldactone.  Potassium supplements discontinued and will likely need to uptitrate Aldactone.  Blood pressure stable today.  **Cocaine use: counseled for impact on health esp in setting of HTN.   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  TTE without vegetation.  Per cardiology note, they d/w neuro and not thought to be embolic.  Plan for CIR post acute hospitalization.   **Hypokalemia:   Continued to be low despite repletion.  Per above renin and aldo pending.  Added Aldactone on 06/30/2019.  Monitor potassium closely.   [place], DO, [place]

## 2019-07-02 NOTE — Progress Notes (Signed)
Speech Language Pathology Daily Session Note  Patient Details  Name: Dameion Briles MRN: [id number] Date of Birth: 1986-08-27  Today's Date: 07/02/2019 SLP Individual Time: 2919-1660 SLP Individual Time Calculation (min): 41 min  Short Term Goals: Week 1: SLP Short Term Goal 1 (Week 1): STGs = LTGs d/t short ELOS  Skilled Therapeutic Interventions:  Pt was seen for skilled ST targeting cognitive goals.  SLP facilitated the session with money management tasks to address problem solving goals.  Pt was able to complete functional math calculations as they pertain to daily word problems for ~75% accuracy with supervision and use of a calculator.  Pt reports that calculations took him "a little longer" to complete but were not vastly different in comparison to his baseline.  Pt was able to recall daily information regarding his plan of care and care team with supervision question cues during conversations with SLP.  Pt overall feels his cognition and communication are close to baseline.  Pt was left in bed with bed alarm set and all needs within reach.  Continue per current plan of care.    Pain Pain Assessment Pain Scale: 0-10 Pain Score: 0-No pain  Therapy/Group: Individual Therapy  Dietrick Barris, Selinda Orion 07/02/2019, 3:55 PM

## 2019-07-03 ENCOUNTER — Inpatient Hospital Stay (HOSPITAL_COMMUNITY): Payer: Self-pay | Admitting: Occupational Therapy

## 2019-07-03 ENCOUNTER — Inpatient Hospital Stay (HOSPITAL_COMMUNITY): Payer: Self-pay | Admitting: Speech Pathology

## 2019-07-03 ENCOUNTER — Inpatient Hospital Stay (HOSPITAL_COMMUNITY): Payer: Self-pay | Admitting: Physical Therapy

## 2019-07-03 ENCOUNTER — Encounter (HOSPITAL_COMMUNITY): Payer: Self-pay | Admitting: Psychology

## 2019-07-03 LAB — PROTEIN / CREATININE RATIO, URINE
Creatinine, Urine: 49.56 mg/dL
Protein Creatinine Ratio: 1.41 mg/mg{Cre} — ABNORMAL HIGH (ref 0.00–0.15)
Total Protein, Urine: 70 mg/dL

## 2019-07-03 LAB — RENAL FUNCTION PANEL
Albumin: 3.4 g/dL — ABNORMAL LOW (ref 3.5–5.0)
Anion gap: 12 (ref 5–15)
BUN: 40 mg/dL — ABNORMAL HIGH (ref 6–20)
CO2: 22 mmol/L (ref 22–32)
Calcium: 9.2 mg/dL (ref 8.9–10.3)
Chloride: 102 mmol/L (ref 98–111)
Creatinine, Ser: 7.15 mg/dL — ABNORMAL HIGH (ref 0.61–1.24)
GFR calc Af Amer: 11 mL/min — ABNORMAL LOW (ref >60)
GFR calc non Af Amer: 9 mL/min — ABNORMAL LOW (ref >60)
Glucose, Bld: 110 mg/dL — ABNORMAL HIGH (ref 70–99)
Phosphorus: 4.3 mg/dL (ref 2.5–4.6)
Potassium: 3.7 mmol/L (ref 3.5–5.1)
Sodium: 136 mmol/L (ref 135–145)

## 2019-07-03 LAB — URINALYSIS, ROUTINE W REFLEX MICROSCOPIC
Bacteria, UA: NONE SEEN
Bilirubin Urine: NEGATIVE
Glucose, UA: NEGATIVE mg/dL
Hgb urine dipstick: NEGATIVE
Ketones, ur: NEGATIVE mg/dL
Leukocytes,Ua: NEGATIVE
Nitrite: NEGATIVE
Protein, ur: 100 mg/dL — AB
Specific Gravity, Urine: 1.004 — ABNORMAL LOW (ref 1.005–1.030)
pH: 7 (ref 5.0–8.0)

## 2019-07-03 LAB — PROTEIN ELECTROPHORESIS, SERUM
A/G Ratio: 1.4 (ref 0.7–1.7)
Albumin ELP: 3.2 g/dL (ref 2.9–4.4)
Alpha-1-Globulin: 0.3 g/dL (ref 0.0–0.4)
Alpha-2-Globulin: 0.6 g/dL (ref 0.4–1.0)
Beta Globulin: 0.9 g/dL (ref 0.7–1.3)
Gamma Globulin: 0.5 g/dL (ref 0.4–1.8)
Globulin, Total: 2.3 g/dL (ref 2.2–3.9)
Total Protein ELP: 5.5 g/dL — ABNORMAL LOW (ref 6.0–8.5)

## 2019-07-03 LAB — HEPATITIS C ANTIBODY: HCV Ab: <0.1 s/co ratio (ref 0.0–0.9)

## 2019-07-03 LAB — CBC
HCT: 31.7 % — ABNORMAL LOW (ref 39.0–52.0)
Hemoglobin: 10.8 g/dL — ABNORMAL LOW (ref 13.0–17.0)
MCH: 31.8 pg (ref 26.0–34.0)
MCHC: 34.1 g/dL (ref 30.0–36.0)
MCV: 93.2 fL (ref 80.0–100.0)
Platelets: 346 10*3/uL (ref 150–400)
RBC: 3.4 MIL/uL — ABNORMAL LOW (ref 4.22–5.81)
RDW: 12.3 % (ref 11.5–15.5)
WBC: 9.2 10*3/uL (ref 4.0–10.5)
nRBC: 0 % (ref 0.0–0.2)

## 2019-07-03 LAB — ANCA TITERS
Atypical P-ANCA titer: <1:20 {titer}
C-ANCA: <1:20 {titer}
P-ANCA: <1:20 {titer}

## 2019-07-03 LAB — ANA W/REFLEX IF POSITIVE: Anti Nuclear Antibody (ANA): NEGATIVE

## 2019-07-03 LAB — HEPATITIS B SURFACE ANTIGEN: Hepatitis B Surface Ag: NEGATIVE

## 2019-07-03 LAB — GLOMERULAR BASEMENT MEMBRANE ANTIBODIES: GBM Ab: 2 units (ref 0–20)

## 2019-07-03 NOTE — Progress Notes (Addendum)
[facility]    NEPHROLOGY PROGRESS NOTE  SUBJECTIVE: Patient seen and examined.  Has no specific complaints today.  Reports his speech is improving.  Is working well with physical therapy.  Denies headaches, fevers, chills, chest pain, shortness of breath, nausea, vomiting, diarrhea or dysuria.  All other review of systems are negative.   OBJECTIVE:  Vitals:   07/03/19 0533 07/03/19 1257  BP: (!) 140/94 (!) 156/107  Pulse: 73 74  Resp: 16 16  Temp:  98 F (36.7 C)  SpO2: 100% 100%    Intake/Output Summary (Last 24 hours) at 07/03/2019 1306 Last data filed at 07/02/2019 2200 Gross per 24 hour  Intake 720 ml  Output -  Net 720 ml      General:  AAOx3 NAD HEENT: MMM [place] AT anicteric sclera Neck:  No JVD, no adenopathy CV:  Heart RRR  Lungs:  L/S CTA bilaterally Abd:  abd SNT/ND with normal BS GU:  Bladder non-palpable Extremities:  No LE edema. Skin:  No skin rash  MEDICATIONS:  . aspirin  81 mg Oral Daily  . atorvastatin  40 mg Oral q1800  . carvedilol  25 mg Oral BID WC  . clopidogrel  75 mg Oral Daily  . diltiazem  120 mg Oral Q8H  . heparin injection (subcutaneous)  5,000 Units Subcutaneous Q8H  . hydrALAZINE  50 mg Oral Q8H  . nicotine  14 mg Transdermal Daily  . spironolactone  25 mg Oral Daily       LABS:   CBC Latest Ref Rng & Units 07/03/2019 06/30/2019 06/27/2019  WBC 4.0 - 10.5 K/uL 9.2 9.6 9.5  Hemoglobin 13.0 - 17.0 g/dL 10.8(L) 10.9(L) 11.7(L)  Hematocrit 39.0 - 52.0 % 31.7(L) 32.6(L) 34.2(L)  Platelets 150 - 400 K/uL 346 338 301    CMP Latest Ref Rng & Units 07/03/2019 07/02/2019 07/01/2019  Glucose 70 - 99 mg/dL 110(H) 110(H) 111(H)  BUN 6 - 20 mg/dL 40(H) 38(H) 39(H)  Creatinine 0.61 - 1.24 mg/dL 7.15(H) 6.91(H) 6.63(H)  Sodium 135 - 145 mmol/L 136 136 137  Potassium 3.5 - 5.1 mmol/L 3.7 3.5 3.4(L)  Chloride 98 - 111 mmol/L 102 103 103  CO2 22 - 32 mmol/L 22 23 24   Calcium 8.9 - 10.3 mg/dL 9.2 9.2 9.2  Total Protein 6.5 - 8.1  g/dL - - -  Total Bilirubin 0.3 - 1.2 mg/dL - - -  Alkaline Phos 38 - 126 U/L - - -  AST 15 - 41 U/L - - -  ALT 0 - 44 U/L - - -    Lab Results  Component Value Date   CALCIUM 9.2 07/03/2019   PHOS 4.3 07/03/2019       Component Value Date/Time   COLORURINE YELLOW 06/22/2019 1800   APPEARANCEUR CLEAR 06/22/2019 1800   LABSPEC 1.012 06/22/2019 1800   PHURINE 5.0 06/22/2019 1800   GLUCOSEU 50 (A) 06/22/2019 1800   HGBUR MODERATE (A) 06/22/2019 1800   BILIRUBINUR NEGATIVE 06/22/2019 1800   KETONESUR NEGATIVE 06/22/2019 1800   PROTEINUR >=300 (A) 06/22/2019 1800   NITRITE NEGATIVE 06/22/2019 1800   LEUKOCYTESUR NEGATIVE 06/22/2019 1800   No results found for: PHART, PCO2ART, PO2ART, HCO3, TCO2, ACIDBASEDEF, O2SAT  No results found for: IRON, TIBC, FERRITIN, IRONPCTSAT     ASSESSMENT/PLAN:    ** AKI:  Cr 1.12 on 12/2017 > 06/21/19 presentation 4.95 and around 5-6. Nonoliguric with 719mL UOP yesterday.   06/23/19 renal US - 10.9 and 10.2cm, echogenic; wedge shaped hyperechoic  areas BL = ddx infarcts vs pyelonephritis; No renal infarcts noted on CT a/p from 06/25/19 (noncontrast).  7/9 UA  +++ proteinuria, + glucose, mod blood, neg LE and nit. Microscopy with 0-5 RBC/hpf, no WBC.  UNa 7/9 <10.  UP/C 2.56.  Serologic work-up is pending.  Suspect acute renal failure secondary to hypertensive emergency.   Creatinine continues to worsen.  Aldactone introduced 2 days ago which may have bumped creatinine.  Repeat UA and UPC pending.  May ultimately need renal biopsy if persistent hematuria/proteinuria +/- positive serology (would need to hold ASA and plavix for a week).  Discussed with patient.  Monitor daily labs.  No urgent need for HD.   **  HTN emergency:  Dx with HTN several years ago, no consistent treatment.  MRI 7/9 with BL infarcts (acute in both hemispheres and brainstem), chronic small vessel changes, consider PRES. Adrenal glands normal on 06/25/19 CT.   Currently on hydralazine 50 TID,  Diltiazem to 120 q8h, coreg 12.5 BID.  Increase coreg 25 BID.  No RAAS inhibitors now in setting of such low GFR and lack of consistent f/u in the past.   Renin, aldo levels still pending.  Renal artery duplex neg for RAS.    With persistent hypokalemia despite potassium supplementation, started low dose of Aldactone.  Potassium supplements discontinued and will likely need to uptitrate Aldactone.  Blood pressure stable today.  **Cocaine use: counseled for impact on health esp in setting of HTN.   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  TTE without vegetation.  Per cardiology note, they d/w neuro and not thought to be embolic.  Plan for CIR post acute hospitalization.   **Hypokalemia:   Continued to be low despite repletion.  Per above renin and aldo pending.  Added Aldactone on 06/30/2019.  Monitor potassium closely.   [place], DO, [place]

## 2019-07-03 NOTE — Progress Notes (Signed)
Occupational Therapy Session Note  Patient Details  Name: William [name] MRN: [id number] Date of Birth: 06-10-1986  Today's Date: 07/03/2019 OT Individual Time: 1400-1500 OT Individual Time Calculation (min): 60 min    Short Term Goals: Week 1:  OT Short Term Goal 1 (Week 1): STG=LTG due to LOS  Skilled Therapeutic Interventions/Progress Updates:    Patient seated in bed, alert and ready for therapy session.  He is pleasant and cooperative t/o session.  S level for don/doff shoes and toileting.  Functional transfers at S level.  Ambulation on unit without AD S level without LOB.   Dynavision:  In stance, 2 min, full screen:   R = 1.43 sec, L = 1.14 sec                      In stance, 2 min, full screen, R (red), L (green) = R 1.49 sec, L 1.62 sec Design copy with small pipe tree - WFL with increased time Position in space puzzle - level 1 - unable  Visual motor:  Pursuits - R/L (horizontal) jerky movement in both directions - pt notes dizziness (7/10)                                           Ant/post with smooth motion - notes mild dizziness                          Accomodation activity - WFL for time but pt notes strain and "woozy" feeling                          Fransisco Beau string - able to see "X" pattern within 4 feet but possible suppression > 4 ft - again notes strain and eye fatigue.  Reviewed activities that he can complete on his own to promote visual motor recovery - may need to reinforce in upcoming sessions.  Patient returned to bed at close of session - bed alarm set and call bell, phone in reach.    Therapy Documentation Precautions:  Precautions Precautions: Fall Precaution Comments: monitor BP; R inattention Restrictions Weight Bearing Restrictions: No General:   Vital Signs: Therapy Vitals Temp: 98 F (36.7 C) Temp Source: Oral Pulse Rate: 74 Resp: 16 BP: (!) 156/107 Patient Position (if appropriate): Sitting Oxygen Therapy SpO2: 100 % O2 Device: Room  Air Pain: Pain Assessment Pain Scale: 0-10 Pain Score: 0-No pain   Other Treatments:     Therapy/Group: Individual Therapy  Carlos Levering 07/03/2019, 3:58 PM

## 2019-07-03 NOTE — Care Management (Signed)
[facility] Individual Statement of Services  Patient Name:  Traycen Goyer  Date:  07/03/2019  Welcome to the [facility].  Our goal is to provide you with an individualized program based on your diagnosis and situation, designed to meet your specific needs.  With this comprehensive rehabilitation program, you will be expected to participate in at least 3 hours of rehabilitation therapies Monday-Friday, with modified therapy programming on the weekends.  Your rehabilitation program will include the following services:  Physical Therapy (PT), Occupational Therapy (OT), Speech Therapy (ST), 24 hour per day rehabilitation nursing, Neuropsychology, Case Management (Social Worker), Rehabilitation Medicine, Nutrition Services and Pharmacy Services  Weekly team conferences will be held on Tuesdays to discuss your progress.  Your Social Worker will talk with you frequently to get your input and to update you on team discussions.  Team conferences with you and your family in attendance may also be held.  Expected length of stay: 5-7 days   Overall anticipated outcome: modified independent  Depending on your progress and recovery, your program may change. Your Social Worker will coordinate services and will keep you informed of any changes. Your Social Worker's name and contact numbers are listed  below.  The following services may also be recommended but are not provided by the [facility] will be made to provide these services after discharge if needed.  Arrangements include referral to agencies that provide these services.  Your insurance has been verified to be:  uninsured Your primary doctor is:  none  Pertinent information will be shared with your doctor and your insurance company.  Social  Worker:  [place], [place] or ([phone]   Information discussed with and copy given to patient by: Lennart Pall, 07/03/2019, 11:50 AM

## 2019-07-03 NOTE — Consult Note (Signed)
Neuropsychological Consultation   Patient:   William [name]   DOB:   [birth year]  MR Number:  [id number]  Location:  [facility] [id number] [place] [address] Dept: [place]: [phone]           Date of Service:   07/03/2019  Start Time:   8 AM 9 AM End Time:   9 AM  Provider/Observer:  Ilean Skill, Psy.D.       Clinical Neuropsychologist       Billing Code/Service: [phone]  Chief Complaint:    William [name] is a 33 year old male with a history of hypertension, polysubstance abuse who was admitted to the emergency department on 06/22/2019 due to shortness of breath, headache and malignant hypertension.  The patient reports that he had been out of his blood pressure meds for the prior 6 months.  The patient's urine drug screen was positive for THC and cocaine.  He was noted to have acute renal failure and head CT showed patchy areas of low density bilateral caudate periventricular white matter and right thalamic region infarcts.  MRI brain showed numerous scattered acute infarcts in both cerebral hemispheres and brainstem, severe T2 hyperintensities and expansion of pons, but Dula and medial thalami with superimposed corpus callosum and cerebral white matter disease.  Radiology questioned whether these changes were due to PRES.  Neurology consult felt that the cortical and subcortical changes were likely due to PRES versus multifactorial demyelination osmotic demyelinization due to borderline nutritional status, hyponatremia, renal failure and heavy alcohol use.  Following his acute care the patient was referred to the comprehensive inpatient rehabilitation unit for rehab due to functional deficits.  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 below is  the HPI for the current admission.  William [name] is a 82 year oldLH-male with history of HTN, polysubstance abuse who was admitted via ED on 07/09/20shortness of breath,headaches and malignant hypertension. He reported having been out of BP meds x 6 months. He was started on Cardene drip and UDS positive for benzos, THC and cocaine.He was noted to have acute renal failure with BUN/SCR-25/4.95 and significantly elevated troponin of6239. 2D echo done showing EF greater than 65% with severe concentric left ventricular hypertrophy and small pericardial effusion. CT of head showed patchy areas low-density bilateral caudate periventricular white matter and right thalamic region. MRI brain showed numerous scattered acute infarcts in both cerebral hemispheres and brainstem's, severe T2 hyperintensity and expansion of pons, medullary and medial thalami with superimposed corpus callosum and cerebral white matter disease--radiology question changes due to PRES. CTA head/neck was negative for stenosis, aneurysm and showed fetal PCA origins greater on the right.  Dr. Percival Spanish was consulted for input and felt elevated troponin likely due to demand ischemia from hypertensive urgency,tachycardia and severe LVH. They felt LVH likely due to longstanding uncontrolled hypertension. Troponin's peakedat9,189 and cards felt likely portend to worsened prognosis. Patient has had issues with disorientation as well as mild right-sided weakness. Dr. Leonie Man felt that abnormal MRI with edema brainstem and cytotoxic edema likely from PR ES versus multifactorial demyelination osmotic demyelinationdue to borderline nutritional status, hyponatremia, renal failure and heavy alcohol use. DAPT recommended x3 weeks followed by aspirin alone. Nephrology consulted for input and felt that AKI due to hypertensive emergency and would be slow to improve. Renal ultrasound showed wedge-shaped  hyperechoic areas bilaterally  question due to infarcts versus pyelonephritis. CT abdomen showed question of gallbladder wall thickening and minimal bilateral perirenal fat stranding. Blood pressures have continued to be labile and renal artery ultrasound was negative for stenosis.Dr. Nadene Rubins that blood pressures appear to be improving and reasonable SBP would be 150s with titration over the next few weeks to months to achieve optimal BP control. He has been educated on cessation of cocaine on his overall health. Therapy has been ongoing and patient continues to be impulsive with poor safety awareness,has cognitive deficits impacting executive functioning and memory has impairments in ability. CIR recommended due to functional decline  Current Status:  The patient reports that he is coping better with being in the hospital for this extended time but is anxious to get home.  The patient reports that he has become acutely aware of how his substance use and lifestyle was placing him at greater and greater risk over time.  The patient reports that he is motivated to work hard in therapies and does plan on taking his medical issues seriously once he is discharged.  Behavioral Observation: William [name]  presents as a 33 y.o.-year-old Right African American Male who appeared his stated age. his dress was Appropriate and he was Well Groomed and his manners were Appropriate to the situation.  his participation was indicative of Appropriate and Redirectable behaviors.  There were any physical disabilities noted.  he displayed an appropriate level of cooperation and motivation.     Interactions:    Active Appropriate and Redirectable  Attention:   abnormal and attention span appeared shorter than expected for age  Memory:   abnormal; remote memory intact, recent memory impaired  Visuo-spatial:  not examined  Speech (Volume):  low  Speech:   slurred; garbled  Thought Process:  Coherent and Relevant  Though Content:  WNL;  not suicidal and not homicidal  Orientation:   person, place and time/date  Judgment:   Fair  Planning:   Poor  Affect:    Appropriate  Mood:    Euthymic  Insight:   Fair  Intelligence:   normal  Medical History:   Past Medical History:  Diagnosis Date  . Drug abuse, cocaine type ([place]) 06/23/2019  . Hyperlipidemia   . Hypertension    Psychiatric History:  The patient denies any prior psychiatric history but does have a significant history of cocaine and alcohol abuse.  Family Med/Psych History:  Family History  Problem Relation Age of Onset  . Diabetes Sister   . Diabetes Maternal Uncle     Impression/DX:  William [name] is a 33 year old male with a history of hypertension, polysubstance abuse who was admitted to the emergency department on 06/22/2019 due to shortness of breath, headache and malignant hypertension.  The patient reports that he had been out of his blood pressure meds for the prior 6 months.  The patient's urine drug screen was positive for THC and cocaine.  He was noted to have acute renal failure and head CT showed patchy areas of low density bilateral caudate periventricular white matter and right thalamic region infarcts.  MRI brain showed numerous scattered acute infarcts in both cerebral hemispheres and brainstem, severe T2 hyperintensities and expansion of pons, but Dula and medial thalami with superimposed corpus callosum and cerebral white matter disease.  Radiology questioned whether these changes were due to PRES.  Neurology consult felt that the cortical and subcortical changes were likely due to PRES versus multifactorial demyelination osmotic demyelinization due  to borderline nutritional status, hyponatremia, renal failure and heavy alcohol use.  Following his acute care the patient was referred to the comprehensive inpatient rehabilitation unit for rehab due to functional deficits.  The patient reports that he is coping better with being in the hospital  for this extended time but is anxious to get home.  The patient reports that he has become acutely aware of how his substance use and lifestyle was placing him at greater and greater risk over time.  The patient reports that he is motivated to work hard in therapies and does plan on taking his medical issues seriously once he is discharged.  Disposition/Plan:  Today we worked on coping with the acute changes that he has had from his multiple infarcts as well as chronic changes in functioning that he has had due to extended hypertensive and other issues.  The patient denies severe depression but does report that he is having some difficulties coping with sudden loss of function and concern/worry about what his future prognosis is.  Diagnosis:   PRES        Electronically Signed   _______________________ Ilean Skill, Psy.D.

## 2019-07-03 NOTE — Progress Notes (Signed)
[facility] PHYSICAL MEDICINE & REHABILITATION PROGRESS NOTE   Subjective/Complaints: No new issues this morning. Up with neuro-psych early today  ROS: Patient denies fever, rash, sore throat, blurred vision, nausea, vomiting, diarrhea, cough, shortness of breath or chest pain, joint or back pain, headache, or mood change.    Objective:   No results found. Recent Labs    07/03/19 0738  WBC 9.2  HGB 10.8*  HCT 31.7*  PLT 346   Recent Labs    07/02/19 0626 07/03/19 0738  NA 136 136  K 3.5 3.7  CL 103 102  CO2 23 22  GLUCOSE 110* 110*  BUN 38* 40*  CREATININE 6.91* 7.15*  CALCIUM 9.2 9.2    Intake/Output Summary (Last 24 hours) at 07/03/2019 1034 Last data filed at 07/02/2019 2200 Gross per 24 hour  Intake 720 ml  Output -  Net 720 ml     Physical Exam: Vital Signs Blood pressure (!) 140/94, pulse 73, temperature 98.6 F (37 C), temperature source Oral, resp. rate 16, height 5\' 9"  (1.753 m), weight 83.7 kg, SpO2 100 %. Constitutional: No distress . Vital signs reviewed. HEENT: EOMI, oral membranes moist Neck: supple Cardiovascular: RRR without murmur. No JVD    Respiratory: CTA Bilaterally without wheezes or rales. Normal effort    GI: BS +, non-tender, non-distended  Musculoskeletal:  General: No tendernessor edema.  Neurological: reasonable insight and awareness. Still  slightly distracted. exhibitsnormal muscle tone. Pt with reasonable insight and awareness. No focal CN abnl. RUE 4+/5. LUE remains 4/5. B/L LE 4+/5.  No dysmetria or fin motor noted on exam  Psychiatric: pleasant   Assessment/Plan: 1. Functional deficits secondary to PRES which require 3+ hours per day of interdisciplinary therapy in a comprehensive inpatient rehab setting.  Physiatrist is providing close team supervision and 24 hour management of active medical problems listed below.  Physiatrist and rehab team continue to assess barriers to discharge/monitor patient progress  toward functional and medical goals  Care Tool:  Bathing    Body parts bathed by patient: Right arm, Left arm, Abdomen, Chest, Front perineal area, Buttocks, Right upper leg, Left upper leg, Right lower leg, Left lower leg, Face         Bathing assist Assist Level: Supervision/Verbal cueing     Upper Body Dressing/Undressing Upper body dressing   What is the patient wearing?: Pull over shirt    Upper body assist Assist Level: Set up assist    Lower Body Dressing/Undressing Lower body dressing      What is the patient wearing?: Underwear/pull up, Pants     Lower body assist Assist for lower body dressing: Supervision/Verbal cueing     Toileting Toileting    Toileting assist Assist for toileting: Supervision/Verbal cueing Assistive Device Comment: urinal   Transfers Chair/bed transfer  Transfers assist     Chair/bed transfer assist level: Set up assist     Locomotion Ambulation   Ambulation assist      Assist level: Supervision/Verbal cueing Assistive device: No Device Max distance: 150 ft   Walk 10 feet activity   Assist     Assist level: Supervision/Verbal cueing Assistive device: No Device   Walk 50 feet activity   Assist    Assist level: Supervision/Verbal cueing Assistive device: No Device    Walk 150 feet activity   Assist    Assist level: Supervision/Verbal cueing Assistive device: No Device    Walk 10 feet on uneven surface  activity   Assist  Assist level: Supervision/Verbal cueing Assistive device: (none)   Wheelchair     Assist Will patient use wheelchair at discharge?: No   Wheelchair activity did not occur: N/A(pt ambulatory, supervision without AD, will d/c at ambulatory level)         Wheelchair 50 feet with 2 turns activity    Assist    Wheelchair 50 feet with 2 turns activity did not occur: N/A(pt ambulatory, supervision without AD, will d/c at ambulatory level)       Wheelchair  150 feet activity     Assist Wheelchair 150 feet activity did not occur: N/A(pt ambulatory, supervision without AD, will d/c at ambulatory level)        Medical Problem List and Plan: 1.Functional and cognitive deficits and left hemiparesissecondary to scattered bi-cerebral infarcts/PRES -Cont  CIR therapies today including PT, OT, and SLP            -should be functionally ready for D/C this week.   -however may need renal biopsy and transfer to acute .      (was discussed with Dr Grayland Ormond, nephro) 2. Antithrombotics: -DVT/anticoagulation:Pharmaceutical:Lovenox -antiplatelet therapy: ContinueASA/Plavix(added 7/16) X 3 weeks followed by ASA alone. 3. Pain Management:Tylenol as needed 4. Mood:LCSW to follow for evaluation and support -antipsychotic agents: N/A 5. Neuropsych: This patientiscapable of making decisions on own behalf. 6. Skin/Wound Care:Routine pressure relief measures 7. Fluids/Electrolytes/Nutrition:Monitor I's and O's. Check lites in a.m. 8 . GBM:[id number] blood pressures 3 times daily. Continue coreg, Cardizem, hydralazine -bp still elevated but better, tolerable -coreg  increased to 25mg  bid 7/16   -nephrology following, dopplers negative for RAS  -no RAAS inhibitors d/t #9 9.Acute kidney injury: Cr continue to rise  -further w.u per nephrology 10. Persistent hypokalemia: potassium 3.4 today  -replace as per renal ,aldactone as well   - aldosterone normal range, PRA elevated 11. Hx of polysubstance abuse: counseling as appropriate Neuropsych consult     LOS: 4 days A FACE TO FACE EVALUATION WAS PERFORMED  Meredith Staggers 07/03/2019, 10:34 AM

## 2019-07-03 NOTE — Progress Notes (Signed)
Physical Therapy Session Note  Patient Details  Name: William [name] MRN: [id number] Date of Birth: [birth year]  Today's Date: 07/03/2019 PT Individual Time: 7893-8101 PT Individual Time Calculation (min): 70 min   Short Term Goals: Week 1:  PT Short Term Goal 1 (Week 1): STG = LTG due to short ELOS.  Skilled Therapeutic Interventions/Progress Updates:  Pt received in bed & agreeable to tx. Pt transfers out of bed with independence and ambulates in room/bathroom mod I to stand to void at toilet and perform grooming tasks at sink (apply deodorant, wash hands). Pt dons socks & shoes with set up assist.  Pt ambulates around unit without AD & distant supervision. Pt negotiates 8 steps without rails with step over then instructional cuing for step to pattern as pt with frequent LOB with step over step pattern but still requires min assist for step-to pattern. Pt completes floor transfer with supervision. While supine on mat on floor pt performs BLE bridges then single leg bridges with each LE with slightly more impaired neuromuscular control L>R. Pt transitions to quadruped and engages in lifting 1 extremity, progressing to bird dog exercises with focus on core/trunk control and balance and NMR; pt demonstrates more impaired balance when lifting RLE/LUE. Pt carries cups stacked on board around unit with focus on dual task, and attempted to add in cognitive task as well, but pt frequently dropping cups without cognitive portion. At rail in hallway pt engages in grapevine walking x 15 ft L & R and tandem walking with intermittent UE support and pt requiring up to mod assist 2/2 LOB. Pt performs single leg stance without UE support and min assist for balance with task focusing on SLS balance. Pt performed cone taps without UE support with min assist for balance with task focusing on weight shifting L<>R, dynamic balance, and coordination. At end of session pt left sitting in recliner with chair alarm donned,  needs in reach, & MD in room.  Pt continues to report "it feels like a dream, like when you just wake up" and upon questioning reports he feels it's more his vision than cognition but cannot describe if his vision is blurry or not.   Therapy Documentation Precautions:  Precautions Precautions: Fall Precaution Comments: monitor BP; R inattention Restrictions Weight Bearing Restrictions: No   Pain: Denies c/o pain.   Therapy/Group: Individual Therapy  [name] 07/03/2019, 10:17 AM

## 2019-07-03 NOTE — Progress Notes (Signed)
Speech Language Pathology Daily Session Note  Patient Details  Name: William [name] MRN: [id number] Date of Birth: Oct 27, 1986  Today's Date: 07/03/2019 SLP Individual Time: 1035-1130 SLP Individual Time Calculation (min): 55 min  Short Term Goals: Week 1: SLP Short Term Goal 1 (Week 1): STGs = LTGs d/t short ELOS  Skilled Therapeutic Interventions: Skilled treatment session focused on cognitive goals. Patient alternated his attention between 2 mildly complex problem solving and organization tasks for ~45 minutes with Mod I. However, patient required Mod verbal and visual cue to self-monitor and correct errors during the complex, scheduling task and Min A verbal cues for specificity during an appointment/calendar making task to maximize recall. Patient left upright in recliner with alarm on and all needs within reach. Continue with current plan of care.      Pain Pain Assessment Pain Scale: 0-10 Pain Score: 0-No pain  Therapy/Group: Individual Therapy  Maryela Tapper 07/03/2019, 12:42 PM

## 2019-07-04 ENCOUNTER — Inpatient Hospital Stay (HOSPITAL_COMMUNITY): Payer: Self-pay | Admitting: Physical Therapy

## 2019-07-04 ENCOUNTER — Inpatient Hospital Stay (HOSPITAL_COMMUNITY): Payer: Self-pay

## 2019-07-04 ENCOUNTER — Inpatient Hospital Stay (HOSPITAL_COMMUNITY): Payer: Self-pay | Admitting: Speech Pathology

## 2019-07-04 LAB — RENAL FUNCTION PANEL
Albumin: 3.4 g/dL — ABNORMAL LOW (ref 3.5–5.0)
Anion gap: 12 (ref 5–15)
BUN: 40 mg/dL — ABNORMAL HIGH (ref 6–20)
CO2: 21 mmol/L — ABNORMAL LOW (ref 22–32)
Calcium: 9.4 mg/dL (ref 8.9–10.3)
Chloride: 101 mmol/L (ref 98–111)
Creatinine, Ser: 7.16 mg/dL — ABNORMAL HIGH (ref 0.61–1.24)
GFR calc Af Amer: 10 mL/min — ABNORMAL LOW (ref >60)
GFR calc non Af Amer: 9 mL/min — ABNORMAL LOW (ref >60)
Glucose, Bld: 112 mg/dL — ABNORMAL HIGH (ref 70–99)
Phosphorus: 4.2 mg/dL (ref 2.5–4.6)
Potassium: 4 mmol/L (ref 3.5–5.1)
Sodium: 134 mmol/L — ABNORMAL LOW (ref 135–145)

## 2019-07-04 LAB — PROTEIN ELECTROPHORESIS, SERUM
A/G Ratio: 1.3 (ref 0.7–1.7)
Albumin ELP: 3.8 g/dL (ref 2.9–4.4)
Alpha-1-Globulin: 0.4 g/dL (ref 0.0–0.4)
Alpha-2-Globulin: 0.8 g/dL (ref 0.4–1.0)
Beta Globulin: 1.1 g/dL (ref 0.7–1.3)
Gamma Globulin: 0.7 g/dL (ref 0.4–1.8)
Globulin, Total: 3 g/dL (ref 2.2–3.9)
Total Protein ELP: 6.8 g/dL (ref 6.0–8.5)

## 2019-07-04 LAB — ANA W/REFLEX IF POSITIVE: Anti Nuclear Antibody (ANA): NEGATIVE

## 2019-07-04 LAB — HEPATITIS C ANTIBODY: HCV Ab: <0.1 s/co ratio (ref 0.0–0.9)

## 2019-07-04 LAB — HEPATITIS B SURFACE ANTIGEN: Hepatitis B Surface Ag: NEGATIVE

## 2019-07-04 MED ORDER — HYDRALAZINE HCL 50 MG PO TABS
100.0000 mg | ORAL_TABLET | Freq: Three times a day (TID) | ORAL | Status: DC
  Administered 2019-07-04 – 2019-07-07 (×9): 100 mg via ORAL
  Filled 2019-07-04 (×10): qty 2

## 2019-07-04 NOTE — Progress Notes (Signed)
Physical Therapy Discharge Summary  Patient Details  Name: William [name] MRN: [id number] Date of Birth: 26-Aug-1986  Today's Date: 07/07/2019   Patient has met 10 of 10 long term goals due to improved activity tolerance, improved balance, improved postural control, increased strength, ability to compensate for deficits, functional use of  right upper extremity, right lower extremity, left upper extremity and left lower extremity, improved attention and improved coordination.  Patient to discharge at an ambulatory level mod I in home without AD, supervision in community mobility & stairs.      Recommendation:  Patient will benefit from ongoing skilled PT services in outpatient setting to continue to advance safe functional mobility, address ongoing impairments in high level balance, awareness/cognition, endurance, strengthening, NMR, and minimize fall risk.  Equipment: No equipment provided  Reasons for discharge: treatment goals met and discharge from hospital  Patient/family agrees with progress made and goals achieved: Yes  PT Discharge Precautions/Restrictions Precautions Precautions: None  Vision/Perception  Pt wears glasses for reading only. No changes in baseline vision. Pt with slight decreased attention to R side.   Cognition Overall Cognitive Status: Impaired/Different from baseline Arousal/Alertness: Awake/alert Orientation Level: Oriented X4 Memory: Impaired Memory Impairment: Decreased short term memory;Decreased recall of new information Awareness: Impaired Awareness Impairment: Anticipatory impairment Problem Solving: Impaired Problem Solving Impairment: Functional complex Executive Function: Self Monitoring;Self Correcting Self Monitoring: Impaired Self Correcting: Impaired Behaviors: Impulsive Safety/Judgment: Impaired   Sensation Sensation Light Touch: Appears Intact Coordination Gross Motor Movements are Fluid and Coordinated: Yes Fine Motor  Movements are Fluid and Coordinated: Yes  Motor  Motor Motor: Abnormal postural alignment and control Motor - Discharge Observations: mild R hemiparesis, deconditioning (pt fatigues with higher level exercises)   Mobility Bed Mobility Bed Mobility: Rolling Right;Rolling Left;Sit to Supine;Supine to Sit Rolling Right: Independent Rolling Left: Independent Supine to Sit: Independent Sit to Supine: Independent Transfers Transfers: Sit to Stand;Stand to Sit Sit to Stand: Independent Stand to Sit: Independent  Locomotion  Gait Ambulation: Yes Gait Assistance: Independent Gait Distance (Feet): (>150 ft) Assistive device: None Gait Gait: Yes Gait velocity: increased High Level Ambulation High Level Ambulation: Backwards walking;Sudden stops;Direction changes Backwards Walking: CGA/min assist Direction Changes: close supervision without AD Sudden Stops: close supervision without AD Stairs / Additional Locomotion Stairs: Yes Stairs Assistance: Supervision/Verbal cueing Stair Management Technique: (1 rail) Number of Stairs: 12 Height of Stairs: 6(inches) Ramp: Independent(ambulatory no AD) Wheelchair Mobility Wheelchair Mobility: No   Trunk/Postural Assessment  Thoracic Assessment Thoracic Assessment: Within Functional Limits Lumbar Assessment Lumbar Assessment: Within Functional Limits Postural Control Righting Reactions: slightly delayed Protective Responses: slightly delayed   Balance Balance Balance Assessed: Yes  Berg Balance Test Sit to Stand: Able to stand without using hands and stabilize independently Standing Unsupported: Able to stand safely 2 minutes Sitting with Back Unsupported but Feet Supported on Floor or Stool: Able to sit safely and securely 2 minutes Stand to Sit: Sits safely with minimal use of hands Transfers: Able to transfer safely, minor use of hands Standing Unsupported with Eyes Closed: Able to stand 10 seconds safely Standing  Ubsupported with Feet Together: Able to place feet together independently and stand 1 minute safely From Standing, Reach Forward with Outstretched Arm: Can reach confidently >25 cm (10") From Standing Position, Pick up Object from Floor: Able to pick up shoe safely and easily From Standing Position, Turn to Look Behind Over each Shoulder: Looks behind from both sides and weight shifts well Turn 360 Degrees: Able to turn 360 degrees safely in  4 seconds or less Standing Unsupported, Alternately Place Feet on Step/Stool: Able to complete 4 steps without aid or supervision Standing Unsupported, One Foot in Front: Able to place foot ahead of the other independently and hold 30 seconds Standing on One Leg: Tries to lift leg/unable to hold 3 seconds but remains standing independently Total Score: 50/56 Patient demonstrates increased fall risk as noted by score of  50/56 on Berg Balance Scale.  (<36= high risk for falls, close to 100%; 37-45 significant >80%; 46-51 moderate >50%; 52-55 lower >25%)   Normal TUG: 07/04/2019: 8.78 seconds 07/06/2019: 7.8 seconds  Cognitive TUG (counting backwards by 3's): 07/04/2019: 13.15 seconds 07/06/2019: 14 seconds   FGA: 07/01/2019: 23/30 07/06/2019: 26/30   Extremity Assessment  All extremities WFL.   [name] 07/07/2019, 2:05 PM

## 2019-07-04 NOTE — Progress Notes (Signed)
Physical Therapy Session Note  Patient Details  Name: William [name] MRN: [id number] Date of Birth: [birth year]  Today's Date: 07/04/2019 PT Individual Time: 1139-1206 and 1305-1400 PT Individual Time Calculation (min): 27 min and 55 min  Short Term Goals: Week 1:  PT Short Term Goal 1 (Week 1): STG = LTG due to short ELOS.  Skilled Therapeutic Interventions/Progress Updates:  Treatment 1: Pt received in recliner & agreeable to tx, no c/o pain reported. Pt ambulates around unit to dayroom & back without AD & mod I. Pt utilizes nu-step on level 6 x 10 minutes with all four extremities with task focusing on coordination of reciprocal movements, global strengthening & endurance training, and NMR. Therapist observes pt to be bleeding from stomach and pt reports it's due to blood thinner shot this morning - RN made aware & applied dressing. In room pt ambulates forwards & backwards x 12 ft x 3 times with min assist for retrograde gait with task focusing on high level balance. Pt engaged in single leg stance with ability to hold position for longer periods of time on this date compared to previous date (5-7 seconds) with min assist for balance. At end of session pt left sitting in recliner with chair alarm donned & all needs at hand.   Treatment 2: Pt received in bathroom, ambulating out with distant supervision. Pt agreeable to tx, no c/o pain reported. Pt ambulates around unit without AD and mod I. Pt engages in n-1 memory task with pt able to recall cards with almost 100% accuracy. Provided pt with 4 items to find on unit, pt only able to verbally recall 2/4. Pt requires cuing/prompting to make written list and min/mod cuing to locate 2 items on the unit. Discussed pt's impulsivity, impaired awareness, impaired ability to self monitor and correct and how it impacts his safety with mobility; pt appears to have poor understanding of information. Pt engages in dual task of ambulating around unit & naming  foods with alternating letters of the alphabet with pt requiring min/mod assist to recall correct next letter or a food. Pt performed the normal and cognitive TUGs (scores noted below) with therapist educating pt on increased fall risk with cognitive TUG & recommendations to limit dual cognitive & physical task when ambulating at home. Pt ambulates with therapist calling out changes in head direction (up/down/left/right), stopping and turning with pt experiencing 1 LOB with min assist to correct. Pt negotiates 12 steps with 1 rail with supervision with 1 LOB but pt able to correct without physical assist for balance; pt unable to recognize whey he lost his balance with therapist educating him on pt's impulsivity & quick movements, with cuing to slow down. Pt also presents with impaired awareness as he requires cuing to ambulate without hands in pockets for safety. At end of session pt left in bed with alarm set & all needs in reach.   Normal TUG: 1) 9.56 seconds 2) 8.68 seconds 3) 8.10 seconds Average: 8.78 seconds  Cognitive TUG (counting backwards by three's from 100):  1) 10.78 seconds 2) 15.93 seconds 3) 12.75 seconds Average: 13.15 seconds Pt with incorrect counting & unaware of this.   Therapy Documentation Precautions:  Precautions Precautions: Fall Precaution Comments: monitor BP; R inattention Restrictions Weight Bearing Restrictions: No     Therapy/Group: Individual Therapy  William [name] 07/04/2019, 2:00 PM

## 2019-07-04 NOTE — Patient Care Conference (Signed)
Inpatient RehabilitationTeam Conference and Plan of Care Update Date: 07/04/2019   Time: 2:30 PM    Patient Name: William [name]      Medical Record Number: [id number]  Date of Birth: [birth year] Sex: Male         Room/Bed: 4W21C/4W21C-01 Payor Info: Payor: MEDICAID [place] / Plan: MEDICAID OF [place] / Product Type: *No Product type* /    Admitting Diagnosis: 2. TBI Team  B CVA; 10-12days  Admit Date/Time:  06/29/2019  5:00 PM Admission Comments: No comment available   Primary Diagnosis:  <principal problem not specified> Principal Problem: <principal problem not specified>  Patient Active Problem List   Diagnosis Date Noted  . Uncontrolled hypertension   . Acute kidney injury ([place])   . PRES (posterior reversible encephalopathy syndrome) 06/29/2019  . 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 Hypertensive emergency 06/22/2019  . Acute renal failure [facility])     Expected Discharge Date: Expected Discharge Date: 07/06/19(or 7/24)  Team Members Present: Physician leading conference: Dr. Alger Simons Social Worker Present: Lennart Pall, LCSW Nurse Present: Mohammed Kindle, RN PT Present: Lavone Nian, PT OT Present: Willeen Cass, OT SLP Present: Weston Anna, SLP PPS Coordinator present : Gunnar Fusi, SLP     Current Status/Progress Goal Weekly Team Focus  Medical   PRES, uncontrolled HTN, worsening renal failure. ?BX  improve bp, stabilize renal function  determine need for HD, ongoing renal work up and needs.   Bowel/Bladder   Continent b/b. LBM 06-30-2019  Remain continent b/b  assess toileting needs q shift and prn   Swallow/Nutrition/ Hydration             ADL's   supervision overall without AD  mod I overall  cognition, functional problem solving, higher level balance, awareness   Mobility   FGA = 23/30, Berg Balance Test = 38/56, distant supervision gait without AD, min assist stairs without rails  mod I gait, supervision stairs  high level balance &  awareness, stair negotiation, gait, endurance, NMR   Communication   Min-Mod A  Supervision  use of speech strategies at the sentence level   Safety/Cognition/ Behavioral Observations  Min-Mod A  Min A  complex problem solving and awareness   Pain   no complaints of pain  2/10  assess pain q shift and prn, medicate as necessary   Skin   eccymosis lower abdomen  skin remain intact fre of infection and breakdown  assess skin q shift and prn    Rehab Goals Patient on target to meet rehab goals: Yes *See Care Plan and progress notes for long and short-term goals.     Barriers to Discharge  Current Status/Progress Possible Resolutions Date Resolved   Physician    Medical stability        see medical progress notes      Nursing                  PT                    OT                  SLP                SW                Discharge Planning/Teaching Needs:  PLan home with wife who can provide 24/7 supervision.      Team Discussion:  PRES + infarcts and  renal failure - labs concerning and biopsy planned.  Supervision to mod independent overall. Moderate impairments with high level executive fxn.  Safety awareness can affect balance at times.   If not medically ready (renal) for d/c by end of week, may need to transfer to renal service.    Revisions to Treatment Plan:  NA    Continued Need for Acute Rehabilitation Level of Care: The patient requires daily medical management by a physician with specialized training in physical medicine and rehabilitation for the following conditions: Daily direction of a multidisciplinary physical rehabilitation program to ensure safe treatment while eliciting the highest outcome that is of practical value to the patient.: Yes Daily medical management of patient stability for increased activity during participation in an intensive rehabilitation regime.: Yes Daily analysis of laboratory values and/or radiology reports with any subsequent need for  medication adjustment of medical intervention for : Neurological problems;Renal problems   I attest that I was present, lead the team conference, and concur with the assessment and plan of the team.   Lennart Pall 07/04/2019, 3:13 PM   Team conference was held via web/ teleconference due to [place] - 57

## 2019-07-04 NOTE — Progress Notes (Signed)
[place] PHYSICAL MEDICINE & REHABILITATION PROGRESS NOTE   Subjective/Complaints: Pt in bed. Feeling well. Denies headache  ROS: Patient denies fever, rash, sore throat, blurred vision, nausea, vomiting, diarrhea, cough, shortness of breath or chest pain, joint or back pain, headache, or mood change.   Objective:   No results found. Recent Labs    07/03/19 0738  WBC 9.2  HGB 10.8*  HCT 31.7*  PLT 346   Recent Labs    07/03/19 0738 07/04/19 0859  NA 136 134*  K 3.7 4.0  CL 102 101  CO2 22 21*  GLUCOSE 110* 112*  BUN 40* 40*  CREATININE 7.15* 7.16*  CALCIUM 9.2 9.4    Intake/Output Summary (Last 24 hours) at 07/04/2019 1037 Last data filed at 07/04/2019 0900 Gross per 24 hour  Intake 840 ml  Output -  Net 840 ml     Physical Exam: Vital Signs Blood pressure (!) 171/106, pulse 72, temperature 97.9 F (36.6 C), resp. rate 16, height 5\' 9"  (1.753 m), weight 83.3 kg, SpO2 100 %. Constitutional: No distress . Vital signs reviewed. HEENT: EOMI, oral membranes moist Neck: supple Cardiovascular: RRR without murmur. No JVD    Respiratory: CTA Bilaterally without wheezes or rales. Normal effort    GI: BS +, non-tender, non-distended  Musculoskeletal:  General: No tendernessor edema.  Neurological: reasonable insight and awareness. Still  slightly distracted. exhibitsnormal muscle tone. Pt with reasonable insight and awareness. No focal CN abnl. RUE 4+/5. LUE remains 4/5. B/L LE 4+/5.  No dysmetria or fin motor noted on exam  Psychiatric: pleasant, cooperative   Assessment/Plan: 1. Functional deficits secondary to PRES which require 3+ hours per day of interdisciplinary therapy in a comprehensive inpatient rehab setting.  Physiatrist is providing close team supervision and 24 hour management of active medical problems listed below.  Physiatrist and rehab team continue to assess barriers to discharge/monitor patient progress toward functional and medical  goals  Care Tool:  Bathing    Body parts bathed by patient: Right arm, Left arm, Abdomen, Chest, Front perineal area, Buttocks, Right upper leg, Left upper leg, Right lower leg, Left lower leg, Face         Bathing assist Assist Level: Supervision/Verbal cueing     Upper Body Dressing/Undressing Upper body dressing   What is the patient wearing?: Pull over shirt    Upper body assist Assist Level: Set up assist    Lower Body Dressing/Undressing Lower body dressing      What is the patient wearing?: Underwear/pull up, Pants     Lower body assist Assist for lower body dressing: Supervision/Verbal cueing     Toileting Toileting    Toileting assist Assist for toileting: Supervision/Verbal cueing Assistive Device Comment: urinal   Transfers Chair/bed transfer  Transfers assist     Chair/bed transfer assist level: Set up assist     Locomotion Ambulation   Ambulation assist      Assist level: Supervision/Verbal cueing Assistive device: No Device Max distance: 150 ft   Walk 10 feet activity   Assist     Assist level: Supervision/Verbal cueing Assistive device: No Device   Walk 50 feet activity   Assist    Assist level: Supervision/Verbal cueing Assistive device: No Device    Walk 150 feet activity   Assist    Assist level: Supervision/Verbal cueing Assistive device: No Device    Walk 10 feet on uneven surface  activity   Assist     Assist level: Supervision/Verbal cueing Assistive  device: (none)   Wheelchair     Assist Will patient use wheelchair at discharge?: No   Wheelchair activity did not occur: N/A(pt ambulatory, supervision without AD, will d/c at ambulatory level)         Wheelchair 50 feet with 2 turns activity    Assist    Wheelchair 50 feet with 2 turns activity did not occur: N/A(pt ambulatory, supervision without AD, will d/c at ambulatory level)       Wheelchair 150 feet activity     Assist  Wheelchair 150 feet activity did not occur: N/A(pt ambulatory, supervision without AD, will d/c at ambulatory level)        Medical Problem List and Plan: 1.Functional and cognitive deficits and left hemiparesissecondary to scattered bi-cerebral infarcts/PRES -Cont  CIR therapies today including PT, OT, and SLP            -team conference today  -will need to stay longer for renal w/u below 2. Antithrombotics: -DVT/anticoagulation:Pharmaceutical:Lovenox -antiplatelet therapy: ContinueASA/Plavix(added 7/16) X 3 weeks followed by ASA alone. 3. Pain Management:Tylenol as needed 4. Mood:LCSW to follow for evaluation and support -antipsychotic agents: N/A 5. Neuropsych: This patientiscapable of making decisions on own behalf. 6. Skin/Wound Care:Routine pressure relief measures 7. Fluids/Electrolytes/Nutrition:Monitor I's and O's. Check lites in a.m. 8 . QTM:AUQJFHL blood pressures 3 times daily. Continue coreg, Cardizem, hydralazine -bp remains elevated although not at original extremes -coreg  increased to 25mg  bid 7/16   -nephrology following, dopplers negative for RAS  -no RAAS inhibitors d/t #9 9.Acute kidney injury: Cr unchanged for first time today  -nephrology recs biopsy  -recent labs pending 10. Persistent hypokalemia: potassium 3.4 today  -replace as per renal ,aldactone as well   - aldosterone normal range, PRA elevated 11. Hx of polysubstance abuse: counseling as appropriate Neuropsych consult     LOS: 5 days A FACE TO FACE EVALUATION WAS PERFORMED  Meredith Staggers 07/04/2019, 10:37 AM

## 2019-07-04 NOTE — Plan of Care (Signed)
  Problem: Consults Goal: RH STROKE PATIENT EDUCATION Description: See Patient Education module for education specifics  Outcome: Progressing   Problem: RH BOWEL ELIMINATION Goal: RH STG MANAGE BOWEL WITH ASSISTANCE Description: STG Manage Bowel with mod I Assistance. Outcome: Progressing   Problem: RH SAFETY Goal: RH STG ADHERE TO SAFETY PRECAUTIONS W/ASSISTANCE/DEVICE Description: STG Adhere to Safety Precautions With supervision/cues Assistance/Device. Outcome: Progressing   Problem: RH PAIN MANAGEMENT Goal: RH STG PAIN MANAGED AT OR BELOW PT'S PAIN GOAL Description: Pain less than 3 on scale of 0-10 Outcome: Progressing   Problem: RH KNOWLEDGE DEFICIT Goal: RH STG INCREASE KNOWLEDGE OF HYPERTENSION Description: Pt will be able to demonstrate understanding of hypertension medications and defined blood pressure parameters. Pt will demonstrate understanding diet restrictions and modifications for blood pressure control using handout provided with cues and reminder assist.  Outcome: Progressing Goal: RH STG INCREASE KNOWLEGDE OF HYPERLIPIDEMIA Description: Pt will be able to demonstrate understanding of antihyperlipidemic medications. Pt will demonstrate understanding diet restrictions and modifications for cholesterol control using handout provided with cues and reminder assist.  Outcome: Progressing

## 2019-07-04 NOTE — Progress Notes (Signed)
Speech Language Pathology Daily Session Note  Patient Details  Name: William [name] MRN: [id number] Date of Birth: 1986-09-26  Today's Date: 07/04/2019 SLP Individual Time: 0815-0915 SLP Individual Time Calculation (min): 60 min  Short Term Goals: Week 1: SLP Short Term Goal 1 (Week 1): STGs = LTGs d/t short ELOS  Skilled Therapeutic Interventions: Skilled treatment session focused on cognitive goals. SLP facilitated session by administering the CLQT. Patient demonstrated mild impairments in attention and visuospatial skills and moderate impairments in executive functioning. Patient educated in regards to results of testing and verbalized understanding. Patient left upright in bed with alarm on and all needs within reach. Continue with current plan of care.      Pain No/Denies Pain   Therapy/Group: Individual Therapy  Suhana Wilner 07/04/2019, 12:27 PM

## 2019-07-04 NOTE — Progress Notes (Signed)
Occupational Therapy Session Note  Patient Details  Name: William [name] MRN: [id number] Date of Birth: 15-Mar-1986  Today's Date: 07/04/2019 OT Individual Time: 1000-1100 OT Individual Time Calculation (min): 60 min    Short Term Goals: Week 1:  OT Short Term Goal 1 (Week 1): STG=LTG due to LOS  Skilled Therapeutic Interventions/Progress Updates:    Pt resting in bed upon arrival.  OT intervention with focus on funcitonal amb without AD, BADL retraining, activity tolerance, standing balance, and safety awareness to increase independence with bADLs.  Pt amb without AD in room to gather clothing before entering bathroom for shower.  Pt completed all bathing and dressing tasks at supervision level.  Sit<>stand in shower and LB dressing.  Pt amb without AD to gym and engaged in dynamic standing balance (boxing with heavy bag). Pt returned to room tossing ball while walking.  No lob noted.  Pt remained in recliner with seat alarm activated and all needs within reach.   Therapy Documentation Precautions:  Precautions Precautions: Fall Precaution Comments: monitor BP; R inattention Restrictions Weight Bearing Restrictions: No  Pain:  Pt c/o headache (unrated) but improved during session   Therapy/Group: Individual Therapy  Leroy Libman 07/04/2019, 12:05 PM

## 2019-07-04 NOTE — Progress Notes (Signed)
Admit: 06/29/2019 LOS: 68  33 year old male with AKI, hypertensive emergency, cocaine use, acute CVA; in CIR.  Subjective:  . No interval events.  No complaints. . Serum creatinine stable at 7.16, potassium and serum bicarbonate stable. Marland Kitchen UOP not quantified . Blood pressures moderately uncontrolled: Carvedilol, diltiazem, hydralazine, spironolactone  07/20 0701 - 07/21 0700 In: 840 [P.O.:840] Out: -   Filed Weights   07/01/19 1930 07/03/19 0500 07/04/19 0639  Weight: 84.3 kg 83.7 kg 83.3 kg    Scheduled Meds: . aspirin  81 mg Oral Daily  . atorvastatin  40 mg Oral q1800  . carvedilol  25 mg Oral BID WC  . clopidogrel  75 mg Oral Daily  . diltiazem  120 mg Oral Q8H  . heparin injection (subcutaneous)  5,000 Units Subcutaneous Q8H  . hydrALAZINE  50 mg Oral Q8H  . nicotine  14 mg Transdermal Daily  . spironolactone  25 mg Oral Daily   Continuous Infusions: PRN Meds:.acetaminophen, aluminum hydroxide, bisacodyl, diphenhydrAMINE, guaiFENesin-dextromethorphan, polyethylene glycol, prochlorperazine **OR** prochlorperazine **OR** prochlorperazine, simethicone, sodium phosphate, traZODone  Current Labs: reviewed  Results for William, [name] (MRN [id number]) as of 07/04/2019 14:32  Ref. Range 06/26/2019 08:53  ALDOSTERONE Latest Ref Range: 0.0 - 30.0 ng/dL 6.7  PRA LC/MS/MS Latest Ref Range: 0.167 - 5.380 ng/mL/hr 23.604 (H)  Results for William, [name] (MRN [id number]) as of 07/04/2019 14:32  Ref. Range 07/01/2019 05:46  Anti Nuclear Antibody (ANA) Latest Ref Range: Negative  Negative  GBM Ab Latest Ref Range: 0 - 20 units 2  Cytoplasmic (C-ANCA) Latest Ref Range: Neg:<1:20 titer <1:20  P-ANCA Latest Ref Range: Neg:<1:20 titer <1:20  Atypical P-ANCA titer Latest Ref Range: Neg:<1:20 titer <1:20  C3 Complement Latest Ref Range: 82 - 167 mg/dL 136  Complement C4, Body Fluid Latest Ref Range: 14 - 44 mg/dL 37   Results for William, [name] (MRN [id number]) as of 07/04/2019 14:32   Ref. Range 07/01/2019 05:46  Anti Nuclear Antibody (ANA) Latest Ref Range: Negative  Negative  GBM Ab Latest Ref Range: 0 - 20 units 2  Cytoplasmic (C-ANCA) Latest Ref Range: Neg:<1:20 titer <1:20  P-ANCA Latest Ref Range: Neg:<1:20 titer <1:20  Atypical P-ANCA titer Latest Ref Range: Neg:<1:20 titer <1:20  C3 Complement Latest Ref Range: 82 - 167 mg/dL 136  Complement C4, Body Fluid Latest Ref Range: 14 - 44 mg/dL 37   Physical Exam:  Blood pressure (!) 171/106, pulse 72, temperature 97.9 F (36.6 C), resp. rate 16, height 5\' 9"  (1.753 m), weight 83.3 kg, SpO2 100 %. NAD, WA/NAD RRR, normal S1 and S2 CTA B No lower extremity edema Conversant, alert and oriented NCAT EOMI  A 1. Severe AKI, not uremic, has not received dialysis.  Urine protein creatinine ratio 1.4.  GN work-up unrevealing, as above.  Given stability of GFR will hold on consideration of biopsy and reassess tomorrow  2. Hypertension, uncontrolled, increase hydralazine 3. Status post CVA, and CIR 4. Cocaine use 5. Hypokalemia, improved with spironolactone, monitor  P . Increase hydralazine . Reassess tomorrow for dialysis and/or biopsy indications . Medication Issues; o Preferred narcotic agents for pain control are hydromorphone, fentanyl, and methadone. Morphine should not be used.  o Baclofen should be avoided o Avoid oral sodium phosphate and magnesium citrate based laxatives / bowel preps    Pearson Grippe MD 07/04/2019, 2:31 PM  Recent Labs  Lab 07/02/19 0626 07/03/19 0738 07/04/19 0859  NA 136 136 134*  K 3.5 3.7 4.0  CL 103 102 101  CO2 23 22 21*  GLUCOSE 110* 110* 112*  BUN 38* 40* 40*  CREATININE 6.91* 7.15* 7.16*  CALCIUM 9.2 9.2 9.4  PHOS 4.2 4.3 4.2   Recent Labs  Lab 06/30/19 0558 07/03/19 0738  WBC 9.6 9.2  NEUTROABS 5.5  --   HGB 10.9* 10.8*  HCT 32.6* 31.7*  MCV 93.9 93.2  PLT 338 346

## 2019-07-05 ENCOUNTER — Inpatient Hospital Stay (HOSPITAL_COMMUNITY): Payer: Medicaid Other | Admitting: Speech Pathology

## 2019-07-05 ENCOUNTER — Inpatient Hospital Stay (HOSPITAL_COMMUNITY): Payer: Medicaid Other

## 2019-07-05 ENCOUNTER — Inpatient Hospital Stay (HOSPITAL_COMMUNITY): Payer: Medicaid Other | Admitting: Physical Therapy

## 2019-07-05 LAB — RENAL FUNCTION PANEL
Albumin: 3.2 g/dL — ABNORMAL LOW (ref 3.5–5.0)
Anion gap: 11 (ref 5–15)
BUN: 44 mg/dL — ABNORMAL HIGH (ref 6–20)
CO2: 22 mmol/L (ref 22–32)
Calcium: 9.3 mg/dL (ref 8.9–10.3)
Chloride: 102 mmol/L (ref 98–111)
Creatinine, Ser: 7.46 mg/dL — ABNORMAL HIGH (ref 0.61–1.24)
GFR calc Af Amer: 10 mL/min — ABNORMAL LOW (ref >60)
GFR calc non Af Amer: 9 mL/min — ABNORMAL LOW (ref >60)
Glucose, Bld: 107 mg/dL — ABNORMAL HIGH (ref 70–99)
Phosphorus: 4.7 mg/dL — ABNORMAL HIGH (ref 2.5–4.6)
Potassium: 4.1 mmol/L (ref 3.5–5.1)
Sodium: 135 mmol/L (ref 135–145)

## 2019-07-05 LAB — ANCA TITERS
Atypical P-ANCA titer: <1:20 {titer}
C-ANCA: <1:20 {titer}
P-ANCA: <1:20 {titer}

## 2019-07-05 LAB — GLOMERULAR BASEMENT MEMBRANE ANTIBODIES: GBM Ab: 7 units (ref 0–20)

## 2019-07-05 NOTE — Progress Notes (Signed)
Speech Language Pathology Daily Session Note  Patient Details  Name: William [name] MRN: [id number] Date of Birth: 05-05-1986  Today's Date: 07/05/2019 SLP Individual Time: 1330-1355 SLP Individual Time Calculation (min): 25 min  Short Term Goals: Week 1: SLP Short Term Goal 1 (Week 1): STGs = LTGs d/t short ELOS  Skilled Therapeutic Interventions: Skilled treatment session focused on speech goals. SLP facilitated session by providing supervision level verbal cues for use of speech intelligibility strategies at the sentence level during a verbal description task to achieve ~90% intelligibility. Patient left upright in bed with alarm on and all needs within reach. Continue with current plan of care.      Pain No/Denies Pain   Therapy/Group: Individual Therapy  Hershel Corkery 07/05/2019, 3:47 PM

## 2019-07-05 NOTE — Progress Notes (Signed)
Physical Therapy Session Note  Patient Details  Name: William [name] MRN: [id number] Date of Birth: Sep 17, 1986  Today's Date: 07/05/2019 PT Individual Time: 0800-0830 PT Individual Time Calculation (min): 30 min   Short Term Goals: Week 1:  PT Short Term Goal 1 (Week 1): STG = LTG due to short ELOS.  Skilled Therapeutic Interventions/Progress Updates:     Patient in bed stating, "I just opened my eyes" upon PT arrival. Patient alert and agreeable to PT session. Focused session on progressing patient to independent mobility within the room while he got ready for the day.   Therapeutic Activity: Bed Mobility: Patient performed supine to/from sit independently in a flat bed without use of bed rails. Performed UE dressing and LB dressing and donned tennis shoes sitting EOB and standing to pull pants up independently with no LOB and good safety awareness. Transfers: Patient performed sit to/from stand x4 independently without an AD with safe technique and no signs of LOB. He stood at the toiled to void and at the sink to wash his hands indpendently without UE support, required cues for using paper towels to dry his hands.    Gait Training:  Patient ambulated 15 feet to from the bathroom using no AD independently, without signs of LOB and good safety awareness. Ambulated with decreased gait speed and wide BOS.   Neuromuscular Re-ed: Patient performed "grapevine" side stepping 10 feet R and L x5, initially without UE wit min A, then with B UE against a wall with supervision. Has difficulty with coordination, only 3/6 crossovers correct on the first trial, progressed to 6/6 on final trial.  Patient in recliner at end of session with breaks locked, chair alarm set, and all needs within reach. Will discussed making patient independent in the room with lead therapist due to independent level with all mobility in the room this morning.    Therapy Documentation Precautions:   Precautions Precautions: None Precaution Comments: monitor BP; R inattention Restrictions Weight Bearing Restrictions: No Vital Signs: Therapy Vitals Pulse Rate: 81 BP: (!) 151/93 Patient Position (if appropriate): Lying Oxygen Therapy SpO2: 99 % O2 Device: Room Air Pain: Patient denied pain throughout session.    Therapy/Group: Individual Therapy  Lissete Maestas L [name] PT, DPT  07/05/2019, 12:29 PM

## 2019-07-05 NOTE — Plan of Care (Signed)
  Problem: Consults Goal: RH STROKE PATIENT EDUCATION Description: See Patient Education module for education specifics  Outcome: Progressing   Problem: RH BOWEL ELIMINATION Goal: RH STG MANAGE BOWEL WITH ASSISTANCE Description: STG Manage Bowel with mod I Assistance. Outcome: Progressing   Problem: RH SAFETY Goal: RH STG ADHERE TO SAFETY PRECAUTIONS W/ASSISTANCE/DEVICE Description: STG Adhere to Safety Precautions With supervision/cues Assistance/Device. Outcome: Progressing   Problem: RH PAIN MANAGEMENT Goal: RH STG PAIN MANAGED AT OR BELOW PT'S PAIN GOAL Description: Pain less than 3 on scale of 0-10 Outcome: Progressing   Problem: RH KNOWLEDGE DEFICIT Goal: RH STG INCREASE KNOWLEDGE OF HYPERTENSION Description: Pt will be able to demonstrate understanding of hypertension medications and defined blood pressure parameters. Pt will demonstrate understanding diet restrictions and modifications for blood pressure control using handout provided with cues and reminder assist.  Outcome: Progressing Goal: RH STG INCREASE KNOWLEGDE OF HYPERLIPIDEMIA Description: Pt will be able to demonstrate understanding of antihyperlipidemic medications. Pt will demonstrate understanding diet restrictions and modifications for cholesterol control using handout provided with cues and reminder assist.  Outcome: Progressing

## 2019-07-05 NOTE — Progress Notes (Signed)
Occupational Therapy Session Note  Patient Details  Name: William [name] MRN: [id number] Date of Birth: 1986/11/16  Today's Date: 07/05/2019 OT Individual Time: 0900-1000 OT Individual Time Calculation (min): 60 min    Short Term Goals: Week 1:  OT Short Term Goal 1 (Week 1): STG=LTG due to LOS  Skilled Therapeutic Interventions/Progress Updates:    Pt resting in recliner upon arrival.  Pt already dressed from previous PT session.  OT intervention with focus on dynamic standing balance and functional amb tasks without AD.  Activities included Dyn[employer]ision on compliant surface with average reaction of 1.33 secs with no LOB, dribbling soccer ball, dribbling basketball, practicing stepping into bath tub without guard rails, and speed walking.  Pt with occasional slight LOB but able to self correct. Pt returned to room and remained in recliner with seat alarm activated and all needs within reach.   Therapy Documentation Precautions:  Precautions Precautions: Fall Precaution Comments: monitor BP; R inattention Restrictions Weight Bearing Restrictions: No Pain: Pain Assessment Pain Scale: 0-10 Pain Score: 0-No pain   Therapy/Group: Individual Therapy  Leroy Libman 07/05/2019, 10:31 AM

## 2019-07-05 NOTE — Progress Notes (Signed)
Social Work Patient ID: William [name], male   DOB: 07-22-1986, 33 y.o.   MRN: [id number]  Pt aware that team is holding on his d/c due to medical issues.  Per Dr. Joelyn Oms, may need to transfer back to acute if not resolved by Friday.  Planning for tomorrow to be last therapy day.    William Mordan, LCSW

## 2019-07-05 NOTE — Progress Notes (Signed)
[place] PHYSICAL MEDICINE & REHABILITATION PROGRESS NOTE   Subjective/Complaints: No new issues. Happy with functional progress. No complaints today  ROS: Patient denies fever, rash, sore throat, blurred vision, nausea, vomiting, diarrhea, cough, shortness of breath or chest pain, joint or back pain, headache, or mood change.    Objective:   No results found. Recent Labs    07/03/19 0738  WBC 9.2  HGB 10.8*  HCT 31.7*  PLT 346   Recent Labs    07/04/19 0859 07/05/19 0524  NA 134* 135  K 4.0 4.1  CL 101 102  CO2 21* 22  GLUCOSE 112* 107*  BUN 40* 44*  CREATININE 7.16* 7.46*  CALCIUM 9.4 9.3    Intake/Output Summary (Last 24 hours) at 07/05/2019 1220 Last data filed at 07/05/2019 0847 Gross per 24 hour  Intake 720 ml  Output -  Net 720 ml     Physical Exam: Vital Signs Blood pressure (!) 151/93, pulse 81, temperature (!) 97.4 F (36.3 C), resp. rate 17, height 5\' 9"  (1.753 m), weight 83.1 kg, SpO2 99 %. Constitutional: No distress . Vital signs reviewed. HEENT: EOMI, oral membranes moist Neck: supple Cardiovascular: RRR without murmur. No JVD    Respiratory: CTA Bilaterally without wheezes or rales. Normal effort    GI: BS +, non-tender, non-distended  Musculoskeletal:  General: No tendernessor edema.  Neurological: reasonable insight and awareness. Still  slightly distracted. exhibitsnormal muscle tone. Pt with reasonable insight and awareness. No focal CN abnl. RUE 4+/5. LUE remains 4/5. B/L LE 4+/5.  No dysmetria or fin motor noted on exam  Psychiatric: pleasant, cooperative   Assessment/Plan: 1. Functional deficits secondary to PRES which require 3+ hours per day of interdisciplinary therapy in a comprehensive inpatient rehab setting.  Physiatrist is providing close team supervision and 24 hour management of active medical problems listed below.  Physiatrist and rehab team continue to assess barriers to discharge/monitor patient progress  toward functional and medical goals  Care Tool:  Bathing    Body parts bathed by patient: Right arm, Left arm, Abdomen, Chest, Front perineal area, Buttocks, Right upper leg, Left upper leg, Right lower leg, Left lower leg, Face         Bathing assist Assist Level: Independent with assistive device     Upper Body Dressing/Undressing Upper body dressing   What is the patient wearing?: Pull over shirt    Upper body assist Assist Level: Independent    Lower Body Dressing/Undressing Lower body dressing      What is the patient wearing?: Underwear/pull up, Pants     Lower body assist Assist for lower body dressing: Independent with assitive device     Toileting Toileting    Toileting assist Assist for toileting: Independent Assistive Device Comment: urinal   Transfers Chair/bed transfer  Transfers assist     Chair/bed transfer assist level: Set up assist     Locomotion Ambulation   Ambulation assist      Assist level: Independent Assistive device: No Device Max distance: 150 ft   Walk 10 feet activity   Assist     Assist level: Independent Assistive device: No Device   Walk 50 feet activity   Assist    Assist level: Independent Assistive device: No Device    Walk 150 feet activity   Assist    Assist level: Independent Assistive device: No Device    Walk 10 feet on uneven surface  activity   Assist     Assist level: Supervision/Verbal  cueing Assistive device: (none)   Wheelchair     Assist Will patient use wheelchair at discharge?: No   Wheelchair activity did not occur: N/A(pt ambulatory, supervision without AD, will d/c at ambulatory level)         Wheelchair 50 feet with 2 turns activity    Assist    Wheelchair 50 feet with 2 turns activity did not occur: N/A(pt ambulatory, supervision without AD, will d/c at ambulatory level)       Wheelchair 150 feet activity     Assist Wheelchair 150 feet  activity did not occur: N/A(pt ambulatory, supervision without AD, will d/c at ambulatory level)        Medical Problem List and Plan: 1.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 Antithrombotics: -DVT/anticoagulation:Pharmaceutical:Lovenox -antiplatelet therapy: ContinueASA/Plavix(added 7/16) X 3 weeks followed by ASA alone. 3. Pain Management:Tylenol as needed 4. Mood:LCSW to follow for evaluation and support -antipsychotic agents: N/A 5. Neuropsych: This patientiscapable of making decisions on own behalf. 6. Skin/Wound Care:Routine pressure relief measures 7. Fluids/Electrolytes/Nutrition:Monitor I's and O's. Check lites in a.m. 8 . RRN:HAFBXUX blood pressures 3 times daily. Continue coreg, Cardizem, hydralazine -bp remains elevated although not at original extremes from admit -coreg  increased to 25mg  bid 7/16 hydralazine to 100mg  q8 on 7/21  -nephrology following, dopplers negative for RAS  -no RAAS inhibitors d/t #9 9.Acute kidney injury: Cr up to 7.43 today  -nephrology recs biopsy  -protein studies, ANA. ANCA, C3,4 negative 10. Persistent hypokalemia: potassium 4.1  -replace as per renal ,aldactone as well   - aldosterone normal range, PRA elevated 11. Hx of polysubstance abuse: counseling as appropriate Neuropsych consult     LOS: 6 days A FACE TO FACE EVALUATION WAS PERFORMED  Meredith Staggers 07/05/2019, 12:20 PM

## 2019-07-05 NOTE — Progress Notes (Signed)
Admit: 06/29/2019 LOS: 72  33 year old male with AKI, hypertensive emergency, cocaine use, acute CVA; in CIR.  Subjective:  . No interval events.  No complaints. No N/V/dysgeusia, itching, hiccups.  . SCr slighlty worsened overnight UOP not quantified . Blood pressures remain moderately uncontrolled: Carvedilol, diltiazem, hydralazine -- inc yesterday, spironolactone  07/21 0701 - 07/22 0700 In: 720 [P.O.:720] Out: -   Filed Weights   07/03/19 0500 07/04/19 0639 07/05/19 0303  Weight: 83.7 kg 83.3 kg 83.1 kg    Scheduled Meds: . aspirin  81 mg Oral Daily  . atorvastatin  40 mg Oral q1800  . carvedilol  25 mg Oral BID WC  . clopidogrel  75 mg Oral Daily  . diltiazem  120 mg Oral Q8H  . heparin injection (subcutaneous)  5,000 Units Subcutaneous Q8H  . hydrALAZINE  100 mg Oral Q8H  . nicotine  14 mg Transdermal Daily  . spironolactone  25 mg Oral Daily   Continuous Infusions: PRN Meds:.acetaminophen, aluminum hydroxide, bisacodyl, diphenhydrAMINE, guaiFENesin-dextromethorphan, polyethylene glycol, prochlorperazine **OR** prochlorperazine **OR** prochlorperazine, simethicone, sodium phosphate, traZODone  Current Labs: reviewed  Results for William, William [name] (MRN [id number]) as of 07/05/2019 13:36  Ref. Range 06/28/2019 10:38 07/03/2019 23:20  Protein Creatinine Ratio Latest Ref Range: 0.00 - 0.15 mg/mgCre 2.56 (H) 1.41 (H)    Ref. Range 06/26/2019 08:53  ALDOSTERONE Latest Ref Range: 0.0 - 30.0 ng/dL 6.7  PRA LC/MS/MS Latest Ref Range: 0.167 - 5.380 ng/mL/hr 23.604 (H)  Results for William, William [name] (MRN [id number]) as of 07/04/2019 14:32  Ref. Range 07/01/2019 05:46  Anti Nuclear Antibody (ANA) Latest Ref Range: Negative  Negative  GBM Ab Latest Ref Range: 0 - 20 units 2  Cytoplasmic (C-ANCA) Latest Ref Range: Neg:<1:20 titer <1:20  P-ANCA Latest Ref Range: Neg:<1:20 titer <1:20  Atypical P-ANCA titer Latest Ref Range: Neg:<1:20 titer <1:20  C3 Complement Latest Ref Range: 82 - 167  mg/dL 136  Complement C4, Body Fluid Latest Ref Range: 14 - 44 mg/dL 37    Physical Exam:  Blood pressure (!) 151/93, pulse 81, temperature (!) 97.4 F (36.3 C), resp. rate 17, height 5\' 9"  (1.753 m), weight 83.1 kg, SpO2 99 %. NAD, WA/NAD RRR, normal S1 and S2 CTA B No lower extremity edema Conversant, alert and oriented NCAT EOMI  A 1. Severe AKI, not uremic, has not received dialysis.  Urine protein creatinine ratio 1.4 and 2.5.  Subnephrothic. GN work-up unrevealing, as above.  I don't think that renal biopsy would change plans a this time.   2. Hypertension, uncontrolled, increase hydralazine 3. Status post CVA, and CIR 4. Cocaine use 5. Hypokalemia, improved with spironolactone, monitor  P . Given ongoing falling GFR not ready for dc, need stable GFR and absence of uremia for DC; might have be come back to acute care . No biopsy for now . F/u on BPs tomorrow . Medication Issues; o Preferred narcotic agents for pain control are hydromorphone, fentanyl, and methadone. Morphine should not be used.  o Baclofen should be avoided o Avoid oral sodium phosphate and magnesium citrate based laxatives / bowel preps    Pearson Grippe MD 07/05/2019, 1:36 PM  Recent Labs  Lab 07/03/19 0738 07/04/19 0859 07/05/19 0524  NA 136 134* 135  K 3.7 4.0 4.1  CL 102 101 102  CO2 22 21* 22  GLUCOSE 110* 112* 107*  BUN 40* 40* 44*  CREATININE 7.15* 7.16* 7.46*  CALCIUM 9.2 9.4 9.3  PHOS 4.3 4.2 4.7*   Recent Labs  Lab 06/30/19 0558 07/03/19 0738  WBC 9.6 9.2  NEUTROABS 5.5  --   HGB 10.9* 10.8*  HCT 32.6* 31.7*  MCV 93.9 93.2  PLT 338 346

## 2019-07-05 NOTE — Progress Notes (Signed)
Physical Therapy Session Note  Patient Details  Name: William [name] MRN: [id number] Date of Birth: [birth year]  Today's Date: 07/05/2019 PT Individual Time: 1133-1200 and 5732-2025 PT Individual Time Calculation (min): 27 min and 68 min  Short Term Goals: Week 1:  PT Short Term Goal 1 (Week 1): STG = LTG due to short ELOS.  Skilled Therapeutic Interventions/Progress Updates:  Treatment 1: Pt received in recliner & agreeable to tx, denying c/o pain. Pt transfers sit<>stand independently and ambulates around unit mod I. Pt dons green theraband in place of belt that is too small. Pt completes Berg Balance Test & scores 50/56; educated pt on interpretation of score & current fall risk. Patient demonstrates increased fall risk as noted by score of 50/56 on Berg Balance Scale.  (<36= high risk for falls, close to 100%; 37-45 significant >80%; 46-51 moderate >50%; 52-55 lower >25%). Pt negotiates 12 steps (6") with 1 rail with supervision and cuing to decrease speed for increased safety with pt slightly less impulsive & quick to move on this date. Pt ambulates around unit while engaging in physical dual task of tossing ball back & forth with therapist, then walking while counting down from 100 by 3's with pt requiring MAX assist for accuracy as pt with impaired thinking. Pt able to recall all activities completed during session with min cuing at end of session. Pt made mod I in room, sign posted & RN made aware. Pt left in recliner.    Treatment 2: Pt received in bed & agreeable to tx, denying c/o pain. Pt ambulates around unit without AD & independence, negotiating ramp with independence and mulch with distant supervision & curb with 1UE support and supervision for community mobility. Pt completes car transfer at SUV simulated height with independence. Pt participated in Wii bowling for balance and functional use of RUE. Pt utilized cybex kinetron in standing without BUE support and supervision with task  focusing on BLE strengthening, weight shifting, dynamic balance, and NMR, 3 bouts x 1 minute each. Pt experiences multiple LOB while using machine but pt able to physically self correct but with poor awareness overall & continues to experience LOB without decreasing speed of activity. Pt utilizes nu-step on level 7 x 12 minutes with all four extremities with task focusing on global strengthening & endurance training. Pt performed 10x sit<>stand x 2 for BLE strengthening. Educated pt on importance of medication compliance upon d/c and risks of another stroke. At end of session pt left in recliner, mod I in room, call bell in reach.   Therapy Documentation Precautions:  Precautions Precautions: None Precaution Comments: monitor BP; R inattention Restrictions Weight Bearing Restrictions: No   Balance: Balance Balance Assessed: Yes Berg Balance Test Sit to Stand: Able to stand without using hands and stabilize independently Standing Unsupported: Able to stand safely 2 minutes Sitting with Back Unsupported but Feet Supported on Floor or Stool: Able to sit safely and securely 2 minutes Stand to Sit: Sits safely with minimal use of hands Transfers: Able to transfer safely, minor use of hands Standing Unsupported with Eyes Closed: Able to stand 10 seconds safely Standing Ubsupported with Feet Together: Able to place feet together independently and stand 1 minute safely From Standing, Reach Forward with Outstretched Arm: Can reach confidently >25 cm (10") From Standing Position, Pick up Object from Floor: Able to pick up shoe safely and easily From Standing Position, Turn to Look Behind Over each Shoulder: Looks behind from both sides and weight shifts well Turn  360 Degrees: Able to turn 360 degrees safely in 4 seconds or less Standing Unsupported, Alternately Place Feet on Step/Stool: Able to complete 4 steps without aid or supervision Standing Unsupported, One Foot in Front: Able to plae foot  ahead of the other independently and hold 30 seconds Standing on One Leg: Tries to lift leg/unable to hold 3 seconds but remains standing independently Total Score: 50    Therapy/Group: Individual Therapy  [name] 07/05/2019, 3:46 PM

## 2019-07-06 ENCOUNTER — Inpatient Hospital Stay (HOSPITAL_COMMUNITY): Payer: Medicaid Other | Admitting: Speech Pathology

## 2019-07-06 ENCOUNTER — Inpatient Hospital Stay (HOSPITAL_COMMUNITY): Payer: Medicaid Other

## 2019-07-06 LAB — RENAL FUNCTION PANEL
Albumin: 3.3 g/dL — ABNORMAL LOW (ref 3.5–5.0)
Anion gap: 13 (ref 5–15)
BUN: 44 mg/dL — ABNORMAL HIGH (ref 6–20)
CO2: 21 mmol/L — ABNORMAL LOW (ref 22–32)
Calcium: 9.2 mg/dL (ref 8.9–10.3)
Chloride: 101 mmol/L (ref 98–111)
Creatinine, Ser: 7.62 mg/dL — ABNORMAL HIGH (ref 0.61–1.24)
GFR calc Af Amer: 10 mL/min — ABNORMAL LOW (ref >60)
GFR calc non Af Amer: 8 mL/min — ABNORMAL LOW (ref >60)
Glucose, Bld: 107 mg/dL — ABNORMAL HIGH (ref 70–99)
Phosphorus: 4.4 mg/dL (ref 2.5–4.6)
Potassium: 4 mmol/L (ref 3.5–5.1)
Sodium: 135 mmol/L (ref 135–145)

## 2019-07-06 NOTE — Progress Notes (Signed)
Speech Language Pathology Daily Session Note  Patient Details  Name: William [name] MRN: [id number] Date of Birth: 1986-12-04  Today's Date: 07/06/2019 SLP Individual Time: 1300-1355 SLP Individual Time Calculation (min): 55 min  Short Term Goals: Week 1: SLP Short Term Goal 1 (Week 1): STGs = LTGs d/t short ELOS  Skilled Therapeutic Interventions: Skilled treatment session focused on cognitive goals. SLP facilitated session by providing Min A verbal cues for organization and Mod A verbal cues for problem solving during a complex money management task. Patient demonstrated selective attention to task for ~30 minutes with Mod I. Patient with increased speech intelligibility today at ~90% at the sentence level but required Min A verbal cues for recall of current medical information as it relates to the impact of his discharge. Patient left upright in bed with all needs within reach. Continue with current plan of care.      Pain No/Denies Pain   Therapy/Group: Individual Therapy  [name] 07/06/2019, 1:57 PM

## 2019-07-06 NOTE — Plan of Care (Signed)
  Problem: Consults Goal: RH STROKE PATIENT EDUCATION Description: See Patient Education module for education specifics  Outcome: Progressing   Problem: RH BOWEL ELIMINATION Goal: RH STG MANAGE BOWEL WITH ASSISTANCE Description: STG Manage Bowel with mod I Assistance. Outcome: Progressing   Problem: RH SAFETY Goal: RH STG ADHERE TO SAFETY PRECAUTIONS W/ASSISTANCE/DEVICE Description: STG Adhere to Safety Precautions With supervision/cues Assistance/Device. Outcome: Progressing   Problem: RH PAIN MANAGEMENT Goal: RH STG PAIN MANAGED AT OR BELOW PT'S PAIN GOAL Description: Pain less than 3 on scale of 0-10 Outcome: Progressing   Problem: RH KNOWLEDGE DEFICIT Goal: RH STG INCREASE KNOWLEDGE OF HYPERTENSION Description: Pt will be able to demonstrate understanding of hypertension medications and defined blood pressure parameters. Pt will demonstrate understanding diet restrictions and modifications for blood pressure control using handout provided with cues and reminder assist.  Outcome: Progressing Goal: RH STG INCREASE KNOWLEGDE OF HYPERLIPIDEMIA Description: Pt will be able to demonstrate understanding of antihyperlipidemic medications. Pt will demonstrate understanding diet restrictions and modifications for cholesterol control using handout provided with cues and reminder assist.  Outcome: Progressing

## 2019-07-06 NOTE — Progress Notes (Signed)
Occupational Therapy Session Note  Patient Details  Name: William [name] MRN: [id number] Date of Birth: July 07, 1986  Today's Date: 07/06/2019 OT Individual Time: 4159-7331 OT Individual Time Calculation (min): 24 min    Short Term Goals: Week 1:  OT Short Term Goal 1 (Week 1): STG=LTG due to LOS  Skilled Therapeutic Interventions/Progress Updates:    1:1. Pt received in bed agreeable to OT. Pt ambulates throughout session with MOD I and No AD. Pt completes 4 standing wii balance board activities with no LOB noted and supervision with VC for using head/hips to facilitate weight shifting. Pt laughing an enjoying activity. Exited session with pt seated in bed with call light in reach and all needs met.  Therapy Documentation Precautions:  Precautions Precautions: None Precaution Comments: monitor BP; R inattention Restrictions Weight Bearing Restrictions: No General:   Vital Signs: Therapy Vitals Temp: 98 F (36.7 C) Pulse Rate: 79 Resp: 16 BP: (!) 165/96 Patient Position (if appropriate): Lying Oxygen Therapy SpO2: 98 % O2 Device: Room Air Pain:   ADL:   Vision   Perception    Praxis   Exercises:   Other Treatments:     Therapy/Group: Individual Therapy  Tonny Branch 07/06/2019, 3:55 PM

## 2019-07-06 NOTE — Progress Notes (Signed)
[place] PHYSICAL MEDICINE & REHABILITATION PROGRESS NOTE   Subjective/Complaints: No complaints. Denies pain or headache  ROS: Patient denies fever, rash, sore throat, blurred vision, nausea, vomiting, diarrhea, cough, shortness of breath or chest pain, joint or back pain, headache, or mood change.    Objective:   No results found. No results for input(s): WBC, HGB, HCT, PLT in the last 72 hours. Recent Labs    07/05/19 0524 07/06/19 0552  NA 135 135  K 4.1 4.0  CL 102 101  CO2 22 21*  GLUCOSE 107* 107*  BUN 44* 44*  CREATININE 7.46* 7.62*  CALCIUM 9.3 9.2    Intake/Output Summary (Last 24 hours) at 07/06/2019 1000 Last data filed at 07/05/2019 1848 Gross per 24 hour  Intake 540 ml  Output -  Net 540 ml     Physical Exam: Vital Signs Blood pressure (!) 148/92, pulse 88, temperature 98.7 F (37.1 C), temperature source Oral, resp. rate 18, height '5\' 9"'  (1.753 m), weight 88.2 kg, SpO2 100 %. Constitutional: No distress . Vital signs reviewed. HEENT: EOMI, oral membranes moist Neck: supple Cardiovascular: RRR without murmur. No JVD    Respiratory: CTA Bilaterally without wheezes or rales. Normal effort    GI: BS +, non-tender, non-distended  Musculoskeletal:  General: No tendernessor edema.  Neurological: reasonable insight and awareness. Still  slightly distracted. exhibitsnormal muscle tone. Pt with reasonable insight and awareness. No focal CN abnl. RUE 4+ to 5/5. LUE remains 4/5. B/L LE 4+ to 5/5.  No dysmetria or fin motor noted on exam  Psychiatric: pleasant, cooperative   Assessment/Plan: 1. Functional deficits secondary to PRES which require 3+ hours per day of interdisciplinary therapy in a comprehensive inpatient rehab setting.  Physiatrist is providing close team supervision and 24 hour management of active medical problems listed below.  Physiatrist and rehab team continue to assess barriers to discharge/monitor patient progress toward  functional and medical goals  Care Tool:  Bathing    Body parts bathed by patient: Right arm, Left arm, Abdomen, Chest, Front perineal area, Buttocks, Right upper leg, Left upper leg, Right lower leg, Left lower leg, Face         Bathing assist Assist Level: Independent with assistive device     Upper Body Dressing/Undressing Upper body dressing   What is the patient wearing?: Pull over shirt    Upper body assist Assist Level: Independent    Lower Body Dressing/Undressing Lower body dressing      What is the patient wearing?: Underwear/pull up, Pants     Lower body assist Assist for lower body dressing: Independent with assitive device     Toileting Toileting    Toileting assist Assist for toileting: Independent Assistive Device Comment: urinal   Transfers Chair/bed transfer  Transfers assist     Chair/bed transfer assist level: Independent     Locomotion Ambulation   Ambulation assist      Assist level: Independent Assistive device: No Device Max distance: 150 ft   Walk 10 feet activity   Assist     Assist level: Independent Assistive device: No Device   Walk 50 feet activity   Assist    Assist level: Independent Assistive device: No Device    Walk 150 feet activity   Assist    Assist level: Independent Assistive device: No Device    Walk 10 feet on uneven surface  activity   Assist     Assist level: Supervision/Verbal cueing Assistive device: (none)   Wheelchair  Assist Will patient use wheelchair at discharge?: No   Wheelchair activity did not occur: N/A(pt ambulatory, supervision without AD, will d/c at ambulatory level)         Wheelchair 50 feet with 2 turns activity    Assist    Wheelchair 50 feet with 2 turns activity did not occur: N/A(pt ambulatory, supervision without AD, will d/c at ambulatory level)       Wheelchair 150 feet activity     Assist Wheelchair 150 feet activity did  not occur: N/A(pt ambulatory, supervision without AD, will d/c at ambulatory level)        Medical Problem List and Plan: 1.Functional and cognitive deficits and 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 I have discussed with nephrology 2. Antithrombotics: -DVT/anticoagulation:Pharmaceutical:Lovenox -antiplatelet therapy: ContinueASA/Plavix(added 7/16) X 3 weeks followed by ASA alone. 3. Pain Management:Tylenol as needed 4. Mood:LCSW to follow for evaluation and support -antipsychotic agents: N/A 5. Neuropsych: This patientiscapable of making decisions on own behalf. 6. Skin/Wound Care:Routine pressure relief measures 7. Fluids/Electrolytes/Nutrition:Monitor I's and O's. Check lites in a.m. 8 . FXT:KWIOXBD blood pressures 3 times daily. Continue coreg, Cardizem, hydralazine -bp with some improvement but remains elevated  -coreg  increased to 64m bid 7/16 hydralazine to [id number] on 7/21  -nephrology following, dopplers negative for RAS  -no RAAS inhibitors d/t #9 9.Acute kidney injury: Cr up to 7.43 today  -nephrology recs biopsy  -protein studies, ANA. ANCA, C3,4 negative 10. Persistent hypokalemia: potassium 4.1  -replace as per renal ,aldactone as well   - aldosterone normal range, PRA elevated 11. Hx of polysubstance abuse: counseling as appropriate Neuropsych consult     LOS: 7 days A [place]/23/2020, 10:00 AM

## 2019-07-06 NOTE — Progress Notes (Signed)
Occupational Therapy Session Note  Patient Details  Name: Booker Bhatnagar MRN: [id number] Date of Birth: 11/01/1986  Today's Date: 07/06/2019 OT Individual Time: 8003-4917 OT Individual Time Calculation (min): 57 min    Short Term Goals: Week 1:  OT Short Term Goal 1 (Week 1): STG=LTG due to LOS  Skilled Therapeutic Interventions/Progress Updates:    1:1. Pt received in bed agreeable to OT reporting already completed bathing, oral care and dressing this morning with no assist. Pt completes ambulation throughout session with no AD and MOD I. Pt completes TTB transfer stepping over with Supervision and VC for taking time/slowing down. Pt completes biodex balance activites for weight shifting with VC for weight shifting at hips with superviison. Pt completes [place] walking with S-MIN A for LOB anterior/posterior LOB and VC for anterior weight shift. Pt completes ambulatory ball toss while naming animals and foods in alphabetical order. Pt unable to keep track of alphabet while bouncing ball back and forth. Pt completes hallway search for post its in pattern 1-a-2-b...with minimally disorganized searching pattern and pt unable to recall where post its were from prior passes in the hallway, but pt able to recall alternating pattern with min question cues. Exited session with pt seated in w/c, call light in reach and all needs met  Therapy Documentation Precautions:  Precautions Precautions: None Precaution Comments: monitor BP; R inattention Restrictions Weight Bearing Restrictions: No General:   Vital Signs: Therapy Vitals Pulse Rate: 88 BP: (!) 148/92 Patient Position (if appropriate): Sitting Oxygen Therapy SpO2: 100 % O2 Device: Room Air Pain: Pain Assessment Pain Scale: 0-10 Pain Score: 0-No pain ADL:   Vision   Perception    Praxis   Exercises:   Other Treatments:     Therapy/Group: Individual Therapy  Tonny Branch 07/06/2019, 10:38 AM

## 2019-07-06 NOTE — Progress Notes (Signed)
Admit: 06/29/2019 LOS: 25  33 year old male with AKI, hypertensive emergency, cocaine use, acute CVA; in CIR.  Subjective:  . No interval events.  No complaints. No N/V/dysgeusia, itching, hiccups.  . SCr slighlty worsened overnight UOP not quantified; K 4.0 . Blood pressures remain moderately uncontrolled: Carvedilol, diltiazem, hydralazine -- inc yesterday, spironolactone  07/22 0701 - 07/23 0700 In: 780 [P.O.:780] Out: -   Filed Weights   07/05/19 0303 07/06/19 0500 07/06/19 0700  Weight: 83.1 kg 83.1 kg 88.2 kg    Scheduled Meds: . aspirin  81 mg Oral Daily  . atorvastatin  40 mg Oral q1800  . carvedilol  25 mg Oral BID WC  . clopidogrel  75 mg Oral Daily  . diltiazem  120 mg Oral Q8H  . heparin injection (subcutaneous)  5,000 Units Subcutaneous Q8H  . hydrALAZINE  100 mg Oral Q8H  . nicotine  14 mg Transdermal Daily  . spironolactone  25 mg Oral Daily   Continuous Infusions: PRN Meds:.acetaminophen, aluminum hydroxide, bisacodyl, diphenhydrAMINE, guaiFENesin-dextromethorphan, polyethylene glycol, prochlorperazine **OR** prochlorperazine **OR** prochlorperazine, simethicone, sodium phosphate, traZODone  Current Labs: reviewed  Results for ZAVON, HYSON (MRN [id number]) as of 07/05/2019 13:36  Ref. Range 06/28/2019 10:38 07/03/2019 23:20  Protein Creatinine Ratio Latest Ref Range: 0.00 - 0.15 mg/mgCre 2.56 (H) 1.41 (H)    Ref. Range 06/26/2019 08:53  ALDOSTERONE Latest Ref Range: 0.0 - 30.0 ng/dL 6.7  PRA LC/MS/MS Latest Ref Range: 0.167 - 5.380 ng/mL/hr 23.604 (H)  Results for [place], ANTOLIN (MRN [id number]) as of 07/04/2019 14:32  Ref. Range 07/01/2019 05:46  Anti Nuclear Antibody (ANA) Latest Ref Range: Negative  Negative  GBM Ab Latest Ref Range: 0 - 20 units 2  Cytoplasmic (C-ANCA) Latest Ref Range: Neg:<1:20 titer <1:20  P-ANCA Latest Ref Range: Neg:<1:20 titer <1:20  Atypical P-ANCA titer Latest Ref Range: Neg:<1:20 titer <1:20  C3 Complement Latest Ref Range:  82 - 167 mg/dL 136  Complement C4, Body Fluid Latest Ref Range: 14 - 44 mg/dL 37    Physical Exam:  Blood pressure (!) 148/92, pulse 88, temperature 98.7 F (37.1 C), temperature source Oral, resp. rate 18, height 5\' 9"  (1.753 m), weight 88.2 kg, SpO2 100 %. NAD, WA/NAD RRR, normal S1 and S2 CTA B No lower extremity edema Conversant, alert and oriented NCAT EOMI  A 1. Severe AKI, not uremic, has not received dialysis.  Urine protein creatinine ratio 1.4 and 2.5.  Subnephrothic. GN work-up unrevealing, as above.  I don't think that renal biopsy would change plans a this time.   2. Hypertension, improved control CTM 3. Status post CVA, and CIR 4. Cocaine use 5. Hypokalemia, improved with spironolactone, monitor  P . Given ongoing falling GFR not ready for dc, need stable GFR and absence of uremia for DC; might have be come back to acute care . No biopsy for now . F/u on BPs and RFP tomorrow . Medication Issues; o Preferred narcotic agents for pain control are hydromorphone, fentanyl, and methadone. Morphine should not be used.  o Baclofen should be avoided o Avoid oral sodium phosphate and magnesium citrate based laxatives / bowel preps    Pearson Grippe MD 07/06/2019, 11:29 AM  Recent Labs  Lab 07/04/19 0859 07/05/19 0524 07/06/19 0552  NA 134* 135 135  K 4.0 4.1 4.0  CL 101 102 101  CO2 21* 22 21*  GLUCOSE 112* 107* 107*  BUN 40* 44* 44*  CREATININE 7.16* 7.46* 7.62*  CALCIUM 9.4 9.3 9.2  PHOS 4.2 4.7*  4.4   Recent Labs  Lab 06/30/19 0558 07/03/19 0738  WBC 9.6 9.2  NEUTROABS 5.5  --   HGB 10.9* 10.8*  HCT 32.6* 31.7*  MCV 93.9 93.2  PLT 338 346

## 2019-07-06 NOTE — Discharge Summary (Signed)
Physician Discharge Summary  Patient ID: William [name] MRN: [id number] DOB/AGE: [birth year] 33 y.o.  Admit date: 06/29/2019 Discharge date: 07/07/2019  Discharge Diagnoses:  Principal Problem:   PRES (posterior reversible encephalopathy syndrome) Active Problems:   Uncontrolled hypertension   Acute kidney injury [facility])   Discharged Condition:  Stable   Significant Diagnostic Studies:N/A l     Labs:  Basic Metabolic Panel: Recent Labs  Lab 07/01/19 0546 07/02/19 0626 07/03/19 0738 07/04/19 0859 07/05/19 0524 07/06/19 0552 07/07/19 0638  NA 137 136 136 134* 135 135 134*  K 3.4* 3.5 3.7 4.0 4.1 4.0 4.1  CL 103 103 102 101 102 101 101  CO2 24 23 22  21* 22 21* 22  GLUCOSE 111* 110* 110* 112* 107* 107* 108*  BUN 39* 38* 40* 40* 44* 44* 42*  CREATININE 6.63* 6.91* 7.15* 7.16* 7.46* 7.62* 7.75*  CALCIUM 9.2 9.2 9.2 9.4 9.3 9.2 9.3  PHOS 3.5 4.2 4.3 4.2 4.7* 4.4 4.6    CBC: CBC Latest Ref Rng & Units 07/03/2019 06/30/2019 06/27/2019  WBC 4.0 - 10.5 K/uL 9.2 9.6 9.5  Hemoglobin 13.0 - 17.0 g/dL 10.8(L) 10.9(L) 11.7(L)  Hematocrit 39.0 - 52.0 % 31.7(L) 32.6(L) 34.2(L)  Platelets 150 - 400 K/uL 346 338 301    CBG: No results for input(s): GLUCAP in the last 168 hours.  Brief HPI:     Hospital Course: Tarquin Welcher was admitted to rehab 06/29/2019 for inpatient therapies to consist of PT, ST and OT at least three hours five days a week. Past admission physiatrist, therapy team and rehab RN have worked together to provide customized collaborative inpatient rehab. Nephrology has been following closely for input with daily labs for monitoring of renal status.  His blood pressures continued to be labile and Coreg and hydralazine has steadily been titrated upwards and spironolactone was added for better control.  He has had steady rise in SCr but continues to have good urine output with out signs of uremia or overload.  Hypokalemia has resolved with addition of hydralazine. His  aldosterone levels, protein studies, ANA, ANCA were WNL, C3/C4 negative and PRA was elevated. He was making great gains with rehab and had progressed to modified independent to supervision level.  Nephrology felt that renal biopsy not needed but patient at high risk for readmission due to ongoing decline in GFR. Patient was discharge to acute hospital for continued monitoring.   Rehab course: During patient's stay in rehab weekly team conferences were held to monitor patient's progress, set goals and discuss barriers to discharge. At admission, patient required min assist for ADL tasks and for mobility.  He demonstrate moderate cognitive deficits affecting semi complex tasks, executive function, memory and awareness.  He  has had improvement in activity tolerance, balance, postural control as well as ability to compensate for deficits. He is able to complete ADL tasks at modified independent level and is ambulating.  He requires supervision with cues to utilize speech intelligibility strategies. He requires min verbal cues to complete semi-complex tasks safely. Family education was completed with wife via phone [place] current deficits and strategies to utilize at home.      Disposition: To acute hospital  Diet: Regular  Special Instructions: 1. Discontinue Plavix after August 6th. 2. Will require ongoing speech therapy to address higher level cognitive deficits 3. Follow up with Cardiology and Neurology in 3-4 weeks.     Current Medications:  . amLODipine  10 mg Oral QHS  . aspirin  81 mg Oral Daily  .  atorvastatin  40 mg Oral q1800  . carvedilol  25 mg Oral BID WC  . clopidogrel  75 mg Oral Daily  . heparin injection (subcutaneous)  5,000 Units Subcutaneous Q8H  . hydrALAZINE  100 mg Oral Q8H  . nicotine  14 mg Transdermal Daily  . spironolactone  25 mg Oral Daily    Follow-up Information    Meredith Staggers, MD Follow up.   Specialty: Physical Medicine and Rehabilitation Why:  Office will call you with follow up appointment Contact information: [address] [place] [address] [phone]           Signed: Bary Leriche 07/07/2019, 5:50 PM

## 2019-07-06 NOTE — Progress Notes (Signed)
Physical Therapy Session Note  Patient Details  Name: William [name] MRN: [id number] Date of Birth: [birth year]  Today's Date: 07/06/2019 PT Individual Time: 0800-0915 PT Individual Time Calculation (min): 75 min   Short Term Goals: Week 1:  PT Short Term Goal 1 (Week 1): STG = LTG due to short ELOS.  Skilled Therapeutic Interventions/Progress Updates:     Patient sitting EOB finishing getting dressed independently upon PT arrival. Patient alert and agreeable to PT session. He sat EOB to finish breakfast as PT educated on fall risk/prevention and home safety, BP management, ranges, and signs and symptoms of elevated BP. Also educated on signs and symptoms of stroke and types of strokes. Provided handouts for all education and reviewed them with the patient during session.  Therapeutic Activity: Bed Mobility: Patient performed rolling R and L supine to/from sit independently in the ADL bed.  Transfers: Patient performed sit to/from stand x10 independently demonstrating safe technique and good safety awareness. He performed a car transfer with set up assist and a floor transfer with supervision for safety. Educated patient on what to do in the event of a fall at home, including self check, activating emergency response if injured or symptomatic, keeping phone in a pocket at all times, and using a sturdy chair or couch and someone near by when getting up if it is safe to do so. Provided written instructions for fall recover and floor transfer.   Gait Training:  Patient ambulated 200 feet x2 using no AD independently. Ambulated with increased BOS and decreased arm swing on R.   Neuromuscular Re-ed: Patient performed FGA, TUG, and cognitive TUG, please see details below. .  Patient in room at end of session, and all needs within reach.    Therapy Documentation Precautions:  Precautions Precautions: None Precaution Comments: monitor BP; R inattention Restrictions Weight Bearing  Restrictions: No Pain: Patient denied pain throughout session.    Balance: Standardized Balance Assessment:  Timed Up and Go Test TUG: Normal TUG;Cognitive TUG Normal TUG (seconds): 7.8 Cognitive TUG (seconds): 14(no correct answers counting back by 3s from a preselected number)  Functional Gait  Assessment Gait assessed : Yes Gait Level Surface: Walks 20 ft in less than 5.5 sec, no assistive devices, good speed, no evidence for imbalance, normal gait pattern, deviates no more than 6 in outside of the 12 in walkway width. Change in Gait Speed: Able to smoothly change walking speed without loss of balance or gait deviation. Deviate no more than 6 in outside of the 12 in walkway width. Gait with Horizontal Head Turns: Performs head turns smoothly with no change in gait. Deviates no more than 6 in outside 12 in walkway width Gait with Vertical Head Turns: Performs head turns with no change in gait. Deviates no more than 6 in outside 12 in walkway width. Gait and Pivot Turn: Pivot turns safely within 3 sec and stops quickly with no loss of balance. Step Over Obstacle: Is able to step over 2 stacked shoe boxes taped together (9 in total height) without changing gait speed. No evidence of imbalance. Gait with Narrow Base of Support: Ambulates 7-9 steps. Gait with Eyes Closed: Walks 20 ft, uses assistive device, slower speed, mild gait deviations, deviates 6-10 in outside 12 in walkway width. Ambulates 20 ft in less than 9 sec but greater than 7 sec. Ambulating Backwards: Walks 20 ft, uses assistive device, slower speed, mild gait deviations, deviates 6-10 in outside 12 in walkway width. Steps: Alternating feet, must use rail.  Total Score: 26/30    Therapy/Group: Individual Therapy  Jayvon Mounger L Nandan Willems PT, DPT  07/06/2019, 12:34 PM

## 2019-07-07 ENCOUNTER — Inpatient Hospital Stay (HOSPITAL_COMMUNITY): Payer: Medicaid Other | Admitting: Speech Pathology

## 2019-07-07 ENCOUNTER — Observation Stay (HOSPITAL_COMMUNITY)
Admission: AD | Admit: 2019-07-07 | Discharge: 2019-07-08 | Disposition: A | Discharge status: Home / Self Care | Payer: Medicaid Other | Source: Other Acute Inpatient Hospital | Attending: Internal Medicine | Admitting: Internal Medicine

## 2019-07-07 ENCOUNTER — Inpatient Hospital Stay (HOSPITAL_COMMUNITY): Payer: Medicaid Other

## 2019-07-07 ENCOUNTER — Inpatient Hospital Stay (HOSPITAL_COMMUNITY): Payer: Medicaid Other | Admitting: Physical Therapy

## 2019-07-07 DIAGNOSIS — E785 Hyperlipidemia, unspecified: Secondary | ICD-10-CM | POA: Diagnosis present

## 2019-07-07 DIAGNOSIS — Z888 Allergy status to other drugs, medicaments and biological substances status: Secondary | ICD-10-CM | POA: Insufficient documentation

## 2019-07-07 DIAGNOSIS — I6782 Cerebral ischemia: Secondary | ICD-10-CM | POA: Insufficient documentation

## 2019-07-07 DIAGNOSIS — Z8673 Personal history of transient ischemic attack (TIA), and cerebral infarction without residual deficits: Secondary | ICD-10-CM | POA: Insufficient documentation

## 2019-07-07 DIAGNOSIS — F1310 Sedative, hypnotic or anxiolytic abuse, uncomplicated: Secondary | ICD-10-CM | POA: Insufficient documentation

## 2019-07-07 DIAGNOSIS — Z716 Tobacco abuse counseling: Secondary | ICD-10-CM | POA: Insufficient documentation

## 2019-07-07 DIAGNOSIS — Z7982 Long term (current) use of aspirin: Secondary | ICD-10-CM | POA: Insufficient documentation

## 2019-07-07 DIAGNOSIS — Z7902 Long term (current) use of antithrombotics/antiplatelets: Secondary | ICD-10-CM | POA: Insufficient documentation

## 2019-07-07 DIAGNOSIS — I161 Hypertensive emergency: Secondary | ICD-10-CM | POA: Diagnosis not present

## 2019-07-07 DIAGNOSIS — N179 Acute kidney failure, unspecified: Principal | ICD-10-CM | POA: Diagnosis present

## 2019-07-07 DIAGNOSIS — I10 Essential (primary) hypertension: Secondary | ICD-10-CM | POA: Diagnosis not present

## 2019-07-07 DIAGNOSIS — F1410 Cocaine abuse, uncomplicated: Secondary | ICD-10-CM | POA: Insufficient documentation

## 2019-07-07 DIAGNOSIS — F17210 Nicotine dependence, cigarettes, uncomplicated: Secondary | ICD-10-CM | POA: Insufficient documentation

## 2019-07-07 DIAGNOSIS — E876 Hypokalemia: Secondary | ICD-10-CM | POA: Insufficient documentation

## 2019-07-07 DIAGNOSIS — D638 Anemia in other chronic diseases classified elsewhere: Secondary | ICD-10-CM

## 2019-07-07 DIAGNOSIS — E871 Hypo-osmolality and hyponatremia: Secondary | ICD-10-CM | POA: Insufficient documentation

## 2019-07-07 DIAGNOSIS — F1210 Cannabis abuse, uncomplicated: Secondary | ICD-10-CM | POA: Insufficient documentation

## 2019-07-07 DIAGNOSIS — F1910 Other psychoactive substance abuse, uncomplicated: Secondary | ICD-10-CM | POA: Diagnosis not present

## 2019-07-07 DIAGNOSIS — R41841 Cognitive communication deficit: Secondary | ICD-10-CM | POA: Insufficient documentation

## 2019-07-07 DIAGNOSIS — Z79899 Other long term (current) drug therapy: Secondary | ICD-10-CM | POA: Insufficient documentation

## 2019-07-07 DIAGNOSIS — R7989 Other specified abnormal findings of blood chemistry: Secondary | ICD-10-CM | POA: Insufficient documentation

## 2019-07-07 DIAGNOSIS — M6281 Muscle weakness (generalized): Secondary | ICD-10-CM | POA: Insufficient documentation

## 2019-07-07 DIAGNOSIS — I248 Other forms of acute ischemic heart disease: Secondary | ICD-10-CM | POA: Insufficient documentation

## 2019-07-07 DIAGNOSIS — Z720 Tobacco use: Secondary | ICD-10-CM | POA: Diagnosis present

## 2019-07-07 DIAGNOSIS — I639 Cerebral infarction, unspecified: Secondary | ICD-10-CM | POA: Diagnosis present

## 2019-07-07 LAB — CREATININE, SERUM
Creatinine, Ser: 8.06 mg/dL — ABNORMAL HIGH (ref 0.61–1.24)
GFR calc Af Amer: 9 mL/min — ABNORMAL LOW (ref >60)
GFR calc non Af Amer: 8 mL/min — ABNORMAL LOW (ref >60)

## 2019-07-07 LAB — RENAL FUNCTION PANEL
Albumin: 3.3 g/dL — ABNORMAL LOW (ref 3.5–5.0)
Anion gap: 11 (ref 5–15)
BUN: 42 mg/dL — ABNORMAL HIGH (ref 6–20)
CO2: 22 mmol/L (ref 22–32)
Calcium: 9.3 mg/dL (ref 8.9–10.3)
Chloride: 101 mmol/L (ref 98–111)
Creatinine, Ser: 7.75 mg/dL — ABNORMAL HIGH (ref 0.61–1.24)
GFR calc Af Amer: 10 mL/min — ABNORMAL LOW (ref >60)
GFR calc non Af Amer: 8 mL/min — ABNORMAL LOW (ref >60)
Glucose, Bld: 108 mg/dL — ABNORMAL HIGH (ref 70–99)
Phosphorus: 4.6 mg/dL (ref 2.5–4.6)
Potassium: 4.1 mmol/L (ref 3.5–5.1)
Sodium: 134 mmol/L — ABNORMAL LOW (ref 135–145)

## 2019-07-07 MED ORDER — FLEET ENEMA 7-19 GM/118ML RE ENEM: 1.0000 | ENEMA | Freq: Once | RECTAL | Status: DC | PRN

## 2019-07-07 MED ORDER — PROCHLORPERAZINE 25 MG RE SUPP
12.5000 mg | Freq: Four times a day (QID) | RECTAL | Status: DC | PRN
  Filled 2019-07-07: qty 1

## 2019-07-07 MED ORDER — NICOTINE 14 MG/24HR TD PT24
14.0000 mg | MEDICATED_PATCH | Freq: Every day | TRANSDERMAL | Status: DC
  Administered 2019-07-08: 09:00:00 14 mg via TRANSDERMAL
  Filled 2019-07-07: qty 1

## 2019-07-07 MED ORDER — ALUMINUM HYDROXIDE GEL 320 MG/5ML PO SUSP
30.0000 mL | Freq: Four times a day (QID) | ORAL | Status: DC | PRN
  Filled 2019-07-07: qty 30

## 2019-07-07 MED ORDER — GUAIFENESIN-DM 100-10 MG/5ML PO SYRP: 5.0000 mL | ORAL_SOLUTION | Freq: Four times a day (QID) | ORAL | Status: DC | PRN

## 2019-07-07 MED ORDER — HYDRALAZINE HCL 20 MG/ML IJ SOLN: 5.0000 mg | INTRAMUSCULAR | Status: DC | PRN

## 2019-07-07 MED ORDER — CLOPIDOGREL BISULFATE 75 MG PO TABS
75.0000 mg | ORAL_TABLET | Freq: Every day | ORAL | Status: DC
  Administered 2019-07-08: 75 mg via ORAL
  Filled 2019-07-07: qty 1

## 2019-07-07 MED ORDER — PROCHLORPERAZINE EDISYLATE 10 MG/2ML IJ SOLN: 5.0000 mg | Freq: Four times a day (QID) | INTRAMUSCULAR | Status: DC | PRN

## 2019-07-07 MED ORDER — SIMETHICONE 80 MG PO CHEW: 80.0000 mg | CHEWABLE_TABLET | Freq: Four times a day (QID) | ORAL | Status: DC | PRN

## 2019-07-07 MED ORDER — ATORVASTATIN CALCIUM 40 MG PO TABS: 40.0000 mg | ORAL_TABLET | Freq: Every day | ORAL | Status: DC

## 2019-07-07 MED ORDER — ACETAMINOPHEN 325 MG PO TABS: 325.0000 mg | ORAL_TABLET | ORAL | Status: DC | PRN

## 2019-07-07 MED ORDER — CARVEDILOL 25 MG PO TABS
25.0000 mg | ORAL_TABLET | Freq: Two times a day (BID) | ORAL | Status: DC
  Administered 2019-07-08: 09:00:00 25 mg via ORAL
  Filled 2019-07-07: qty 1

## 2019-07-07 MED ORDER — HEPARIN SODIUM (PORCINE) 5000 UNIT/ML IJ SOLN
5000.0000 [IU] | Freq: Three times a day (TID) | INTRAMUSCULAR | Status: DC
  Administered 2019-07-07 – 2019-07-08 (×2): 5000 [IU] via SUBCUTANEOUS
  Filled 2019-07-07 (×2): qty 1

## 2019-07-07 MED ORDER — HYDRALAZINE HCL 50 MG PO TABS
100.0000 mg | ORAL_TABLET | Freq: Three times a day (TID) | ORAL | Status: DC
  Administered 2019-07-07 – 2019-07-08 (×2): 100 mg via ORAL
  Filled 2019-07-07 (×2): qty 2

## 2019-07-07 MED ORDER — SPIRONOLACTONE 25 MG PO TABS
25.0000 mg | ORAL_TABLET | Freq: Every day | ORAL | Status: DC
  Administered 2019-07-08: 09:00:00 25 mg via ORAL
  Filled 2019-07-07: qty 1

## 2019-07-07 MED ORDER — PROCHLORPERAZINE MALEATE 5 MG PO TABS
5.0000 mg | ORAL_TABLET | Freq: Four times a day (QID) | ORAL | Status: DC | PRN
  Filled 2019-07-07: qty 2

## 2019-07-07 MED ORDER — SENNOSIDES-DOCUSATE SODIUM 8.6-50 MG PO TABS: 1.0000 | ORAL_TABLET | Freq: Every evening | ORAL | Status: DC | PRN

## 2019-07-07 MED ORDER — AMLODIPINE BESYLATE 10 MG PO TABS
10.0000 mg | ORAL_TABLET | Freq: Every day | ORAL | Status: DC
  Filled 2019-07-07: qty 1

## 2019-07-07 MED ORDER — BISACODYL 10 MG RE SUPP: 10.0000 mg | Freq: Every day | RECTAL | Status: DC | PRN

## 2019-07-07 MED ORDER — ONDANSETRON HCL 4 MG/2ML IJ SOLN: 4.0000 mg | Freq: Three times a day (TID) | INTRAMUSCULAR | Status: DC | PRN

## 2019-07-07 MED ORDER — ASPIRIN 81 MG PO CHEW
81.0000 mg | CHEWABLE_TABLET | Freq: Every day | ORAL | Status: DC
  Administered 2019-07-08: 81 mg via ORAL
  Filled 2019-07-07 (×2): qty 1

## 2019-07-07 MED ORDER — AMLODIPINE BESYLATE 10 MG PO TABS: 10.0000 mg | ORAL_TABLET | Freq: Every day | ORAL | Status: DC

## 2019-07-07 MED ORDER — POLYETHYLENE GLYCOL 3350 17 G PO PACK: 17.0000 g | PACK | Freq: Every day | ORAL | Status: DC | PRN

## 2019-07-07 MED ORDER — AMLODIPINE BESYLATE 10 MG PO TABS
10.0000 mg | ORAL_TABLET | Freq: Every day | ORAL | Status: DC
  Administered 2019-07-07: 10 mg via ORAL

## 2019-07-07 MED ORDER — DIPHENHYDRAMINE HCL 12.5 MG/5ML PO ELIX: 12.5000 mg | ORAL_SOLUTION | Freq: Four times a day (QID) | ORAL | Status: DC | PRN

## 2019-07-07 MED ORDER — TRAZODONE HCL 50 MG PO TABS: 25.0000 mg | ORAL_TABLET | Freq: Every evening | ORAL | Status: DC | PRN

## 2019-07-07 NOTE — Progress Notes (Signed)
Occupational Therapy Session Note  Patient Details  Name: William [name] MRN: [id number] Date of Birth: 08-31-1986  Today's Date: 07/07/2019 OT Individual Time: 1100-1200 OT Individual Time Calculation (min): 60 min    Short Term Goals: Week 1:  OT Short Term Goal 1 (Week 1): STG=LTG due to LOS  Skilled Therapeutic Interventions/Progress Updates:    1;1. Pt received in room in recliner on the phone with disability requesting 15 min to finish phone calls. Pt aggereable to tx after 15 min. Pt ambulates throughout session with MOD I. Pt completes scavenger hunt locating 8 items in halls with min question cues. Pt completes 6x22mn intervals of seated and standing kinetron with up to MOD A for lateral and posterior LOB while standing on kinetron. Pt completes 2x10 bird/dog, kneeling hip extension and squat with ball lift/twist in both PNF patterns for LE, back and core strengthening required for dynamic balance during ADLs/IADLs. Pt completes 9HPT RUE:43.4 seconds and LUE 34.7 seconds demoing decreased [place] BUE. Exited session with pt seated in recliner with call light in reach and all needs met  Therapy Documentation Precautions:  Precautions Precautions: None Precaution Comments: monitor BP; R inattention Restrictions Weight Bearing Restrictions: No General: General PT Missed Treatment Reason: (breakfast) Vital Signs:  Pain: Pain Assessment Pain Scale: 0-10 Pain Score: 0-No pain ADL: ADL Eating: Independent Grooming: Independent Upper Body Bathing: Independent Lower Body Bathing: Independent Upper Body Dressing: Independent Lower Body Dressing: Independent Toileting: Independent Toilet Transfer: Independent Tub/Shower Transfer: Independent Vision   Perception    Praxis Praxis: Intact Exercises:   Other Treatments:     Therapy/Group: Individual Therapy  STonny Branch7/24/2020, 11:20 AM

## 2019-07-07 NOTE — Progress Notes (Signed)
Admit: 06/29/2019 LOS: 49  33 year old male with AKI, hypertensive emergency, cocaine use, acute CVA; in CIR.  Subjective:  . No interval events.  No complaints. No N/V/dysgeusia, itching, hiccups.  . Again, SCr slighlty worsened overnight UOP not quantified; K 4.1 . Blood pressures remain moderately uncontrolled: Carvedilol, diltiazem, hydralazine -- inc yesterday, spironolactone  07/23 0701 - 07/24 0700 In: 840 [P.O.:840] Out: -   Filed Weights   07/06/19 0500 07/06/19 0700 07/07/19 0455  Weight: 83.1 kg 88.2 kg 88 kg    Scheduled Meds: . aspirin  81 mg Oral Daily  . atorvastatin  40 mg Oral q1800  . carvedilol  25 mg Oral BID WC  . clopidogrel  75 mg Oral Daily  . diltiazem  120 mg Oral Q8H  . heparin injection (subcutaneous)  5,000 Units Subcutaneous Q8H  . hydrALAZINE  100 mg Oral Q8H  . nicotine  14 mg Transdermal Daily  . spironolactone  25 mg Oral Daily   Continuous Infusions: PRN Meds:.acetaminophen, aluminum hydroxide, bisacodyl, diphenhydrAMINE, guaiFENesin-dextromethorphan, polyethylene glycol, prochlorperazine **OR** prochlorperazine **OR** prochlorperazine, simethicone, sodium phosphate, traZODone  Current Labs: reviewed  Results for MONTREY, BUIST (MRN [id number]) as of 07/05/2019 13:36  Ref. Range 06/28/2019 10:38 07/03/2019 23:20  Protein Creatinine Ratio Latest Ref Range: 0.00 - 0.15 mg/mgCre 2.56 (H) 1.41 (H)    Ref. Range 06/26/2019 08:53  ALDOSTERONE Latest Ref Range: 0.0 - 30.0 ng/dL 6.7  PRA LC/MS/MS Latest Ref Range: 0.167 - 5.380 ng/mL/hr 23.604 (H)  Results for KYLAN, LIBERATI (MRN [id number]) as of 07/04/2019 14:32  Ref. Range 07/01/2019 05:46  Anti Nuclear Antibody (ANA) Latest Ref Range: Negative  Negative  GBM Ab Latest Ref Range: 0 - 20 units 2  Cytoplasmic (C-ANCA) Latest Ref Range: Neg:<1:20 titer <1:20  P-ANCA Latest Ref Range: Neg:<1:20 titer <1:20  Atypical P-ANCA titer Latest Ref Range: Neg:<1:20 titer <1:20  C3 Complement Latest Ref  Range: 82 - 167 mg/dL 136  Complement C4, Body Fluid Latest Ref Range: 14 - 44 mg/dL 37    Physical Exam:  Blood pressure (!) 160/102, pulse 86, temperature 98.5 F (36.9 C), resp. rate 18, height 5\' 9"  (1.753 m), weight 88 kg, SpO2 99 %. NAD, WA/NAD RRR, normal S1 and S2 CTA B No lower extremity edema Conversant, alert and oriented NCAT EOMI  A 1. Severe AKI, not uremic, has not received dialysis.  Urine protein creatinine ratio 1.4 and 2.5.  Subnephrothic. GN work-up unrevealing, as above.  I don't think that renal biopsy would change plans a this time.   2. Hypertension, uncontrolled 3. Status post CVA, and CIR 4. Cocaine use 5. Hypokalemia, improved with spironolactone, monitor  P . Given ongoing falling GFR not ready for dc, need stable GFR and absence of uremia for DC; might have be come back to acute care . Stop diltiazem, change to amlodipine . Medication Issues; o Preferred narcotic agents for pain control are hydromorphone, fentanyl, and methadone. Morphine should not be used.  o Baclofen should be avoided o Avoid oral sodium phosphate and magnesium citrate based laxatives / bowel preps    Pearson Grippe MD 07/07/2019, 3:17 PM  Recent Labs  Lab 07/05/19 0524 07/06/19 0552 07/07/19 0638  NA 135 135 134*  K 4.1 4.0 4.1  CL 102 101 101  CO2 22 21* 22  GLUCOSE 107* 107* 108*  BUN 44* 44* 42*  CREATININE 7.46* 7.62* 7.75*  CALCIUM 9.3 9.2 9.3  PHOS 4.7* 4.4 4.6   Recent Labs  Lab 07/03/19 [phone]  WBC 9.2  HGB 10.8*  HCT 31.7*  MCV 93.2  PLT 346

## 2019-07-07 NOTE — Progress Notes (Signed)
[facility] PHYSICAL MEDICINE & REHABILITATION PROGRESS NOTE   Subjective/Complaints: No issues. Standing up with PT. Asked me what plan was for today  ROS: Patient denies fever, rash, sore throat, blurred vision, nausea, vomiting, diarrhea, cough, shortness of breath or chest pain, joint or back pain, headache, or mood change.   Objective:   No results found. No results for input(s): WBC, HGB, HCT, PLT in the last 72 hours. Recent Labs    07/06/19 0552 07/07/19 0638  NA 135 134*  K 4.0 4.1  CL 101 101  CO2 21* 22  GLUCOSE 107* 108*  BUN 44* 42*  CREATININE 7.62* 7.75*  CALCIUM 9.2 9.3    Intake/Output Summary (Last 24 hours) at 07/07/2019 1052 Last data filed at 07/06/2019 2204 Gross per 24 hour  Intake 720 ml  Output -  Net 720 ml     Physical Exam: Vital Signs Blood pressure (!) 174/110, pulse 76, temperature 98 F (36.7 C), temperature source Oral, resp. rate 16, height _0  (1.753 m), weight 88 kg, SpO2 100 %. Constitutional: No distress . Vital signs reviewed. HEENT: EOMI, oral membranes moist Neck: supple Cardiovascular: RRR without murmur. No JVD    Respiratory: CTA Bilaterally without wheezes or rales. Normal effort    GI: BS +, non-tender, non-distended  Musculoskeletal:  General: No tendernessor edema.  Neurological: reasonable insight and awareness. Still  slightly distracted. exhibitsnormal muscle tone. Pt with reasonable insight and awareness. No focal CN abnl. RUE 4+ to 5/5. LUE remains 4/5. B/L LE 4+ to 5/5.  No dysmetria or fin motor noted on exam  Psychiatric: pleasant   Assessment/Plan: 1. Functional deficits secondary to PRES which require 3+ hours per day of interdisciplinary therapy in a comprehensive inpatient rehab setting.  Physiatrist is providing close team supervision and 24 hour management of active medical problems listed below.  Physiatrist and rehab team continue to assess barriers to discharge/monitor patient progress  toward functional and medical goals  Care Tool:  Bathing    Body parts bathed by patient: Right arm, Left arm, Abdomen, Chest, Front perineal area, Buttocks, Right upper leg, Left upper leg, Right lower leg, Left lower leg, Face(per pt report)         Bathing assist Assist Level: Independent with assistive device     Upper Body Dressing/Undressing Upper body dressing   What is the patient wearing?: Pull over shirt    Upper body assist Assist Level: Independent(per pt report)    Lower Body Dressing/Undressing Lower body dressing      What is the patient wearing?: Underwear/pull up, Pants(per pt report)     Lower body assist Assist for lower body dressing: Independent with assitive device     Toileting Toileting    Toileting assist Assist for toileting: Independent Assistive Device Comment: urinal   Transfers Chair/bed transfer  Transfers assist     Chair/bed transfer assist level: Independent     Locomotion Ambulation   Ambulation assist      Assist level: Independent Assistive device: No Device Max distance: 150 ft   Walk 10 feet activity   Assist     Assist level: Independent Assistive device: No Device   Walk 50 feet activity   Assist    Assist level: Independent Assistive device: No Device    Walk 150 feet activity   Assist    Assist level: Independent Assistive device: No Device    Walk 10 feet on uneven surface  activity   Assist  Assist level: Supervision/Verbal cueing Assistive device: Other (comment)(none)   Wheelchair     Assist Will patient use wheelchair at discharge?: No   Wheelchair activity did not occur: N/A         Wheelchair 50 feet with 2 turns activity    Assist    Wheelchair 50 feet with 2 turns activity did not occur: N/A       Wheelchair 150 feet activity     Assist Wheelchair 150 feet activity did not occur: N/A        Medical Problem List and  Plan: 1.Functional and cognitive deficits and left hemiparesissecondary to scattered bi-cerebral infarcts/PRES -Cont  CIR therapies today including PT, OT, and SLP            -pt has met all functional goals and is independent  -it appears that patient will need to stay in hospital for ongoing renal mgt given rising Cr. Patient is aware. Awaiting nephrology follow up. 2. Antithrombotics: -DVT/anticoagulation:Pharmaceutical:Lovenox -antiplatelet therapy: ContinueASA/Plavix(added 7/16) X 3 weeks followed by ASA alone. 3. Pain Management:Tylenol as needed 4. Mood:LCSW to follow for evaluation and support -antipsychotic agents: N/A 5. Neuropsych: This patientiscapable of making decisions on own behalf. 6. Skin/Wound Care:Routine pressure relief measures 7. Fluids/Electrolytes/Nutrition:Monitor I's and O's. Check lites in a.m. 8 . ZTI:WPYKDXI blood pressures 3 times daily. Continue coreg, Cardizem, hydralazine.  -consider labetalol to replace coreg?  -nephrology following -coreg  increased to 52m bid 7/16 hydralazine to [id number] on 7/21  -nephrology following, dopplers negative for RAS  -no RAAS inhibitors d/t #9 9.Acute kidney injury: Cr up to 7.43 today  -nephrology recs biopsy  -protein studies, ANA. ANCA, C3,4 negative 10. Persistent hypokalemia: potassium 4.1  -replace as per renal ,aldactone as well   - aldosterone normal range, PRA elevated 11. Hx of polysubstance abuse: counseling as appropriate Neuropsych consult     LOS: 8 days A FACE TO FACE EVALUATION WAS PERFORMED  ZaMeredith Staggers/24/2020, 10:52 AM

## 2019-07-07 NOTE — Progress Notes (Signed)
Pt transferred to 6N17. Pt stable, vital signs at baseline. No complaints at time of discharge. Gerald Stabs, RN

## 2019-07-07 NOTE — Progress Notes (Signed)
Patient admitted to unit from rehab, alert and oriented. Patient settled in bed and oriented to unit and hospital routine. Pt resting comfortably in chair with call bell in reach. Instructed patient to utilize call bell for assistance. Will continue to monitor closely for remainder of shift.

## 2019-07-07 NOTE — Progress Notes (Signed)
Dr. Joelyn Oms states pt is now under [facility] as an admit, text sent to the [facility] for admission.

## 2019-07-07 NOTE — Progress Notes (Signed)
Speech Language Pathology Discharge Summary  Patient Details  Name: William [name] MRN: [id number] Date of Birth: February 06, 1986  Today's Date: 07/07/2019  Session 1: SLP Individual Time: 1000-1030 SLP Individual Time Calculation (min): 30 min    Session 2: SLP Individual Time: 1230-1240 SLP Individual Time Calculation (min): 10 min  Skilled Therapeutic Interventions:    Session 1: Skilled treatment session focused on cognitive goals. SLP facilitated session by providing supervision level verbal cues for problem solving during a novel, mildly complex card task. Patient independently recalled events from previous therapy sessions and conversation with physician. Patient left upright in bed with all needs within reach. Continue with current plan of care.   Session 2: SLP completed education with the patient's wife via telephone in regards to patient's current cognitive deficits and strategies to utilize at home to maximize recall, problem solving and overall safety. She verbalized understanding and all questions were answered.   Patient has met 3 of 4 long term goals.  Patient to discharge at [facility] level.   Reasons goals not met: Patient continues to require overall supervision level verbal cues for use of speech intelligibility strategies   Clinical Impression/Discharge Summary: Patient has made functional gains and has met 3 of 4 LTGs this admission. Currently, patient requires overall supervision level verbal cues for use of speech intelligibility strategies to achieve ~90% intelligibility at the sentence level. Patient also requires overall Min A verbal cues to complete semi-complex tasks safely in regards to problem solving, recall and awareness. Patient and family education is complete and patient will discharge home with 24 hour supervision from family. Patient would benefit from f/u SLP services to maximize his cognitive functioning, speech intelligibility and overall functional  independence in order to reduce caregiver burden.   Care Partner:  Caregiver Able to Provide Assistance: Yes  Type of Caregiver Assistance: Cognitive  Recommendation:  Outpatient SLP;24 hour supervision/assistance  Rationale for SLP Follow Up: Maximize cognitive function and independence;Reduce caregiver burden;Maximize functional communication   Equipment: N/A   Reasons for discharge: Discharged from hospital   Patient/Family Agrees with Progress Made and Goals Achieved: Yes    Lares, Huey 07/07/2019, 6:53 AM

## 2019-07-07 NOTE — H&P (Signed)
History and Physical    William [name] PZW:[id number] DOB: October 08, 1986 DOA: 07/07/2019  Referring MD/NP/PA:   PCP: Patient, No Pcp Per   Patient coming from:  The patient is coming from home.  At baseline, pt is independent for most of ADL.        Chief Complaint: acute renal injury  HPI: William [name] is a 33 y.o. male with medical history significant of hypertension, hyperlipidemia, polysubstance abuse (tobacco, cocaine, marijuana, benzo), recent acute stroke, who is transferred from inpatient rehab to floor due to persistent acute renal injury.  Patient was recently hospitalized from 7/8-7/16 due to acute stroke, hypertensive urgency, acute renal injury.  Patient has completed a course of inpatient rehab.  Due to persistent acute renal injury, patient needs to be monitored in hospital per renal, Dr. Lajoyce Lauber. Pt initially had creatinine 4.95, BUN 25. He still has creatinine 7.75, BUN 42 today.  Per renal, patient did not need dialysis.  Regarding acute stroke, patient states that he feels normal.  No unilateral weakness, numbness or tingling to extremities.  No facial droop or slurred speech.  Patient denies any chest pain, shortness of breath, cough, fever or chills.  No nausea vomiting, diarrhea, abdominal pain, symptoms of UTI.  Currently, his blood pressure is 162/9, heart rate 84, RR 18, oxygen saturation 100% on room air, temperature normal.  Review of Systems:   General: no fevers, chills, no body weight gain, fatigue HEENT: no blurry vision, hearing changes or sore throat Respiratory: no dyspnea, coughing, wheezing CV: no chest pain, no palpitations GI: no nausea, vomiting, abdominal pain, diarrhea, constipation GU: no dysuria, burning on urination, increased urinary frequency, hematuria  Ext: no leg edema Neuro: no unilateral weakness, numbness, or tingling, no vision change or hearing loss Skin: no rash, no skin tear. MSK: No muscle spasm, no deformity, no limitation of  range of movement in spin Heme: No easy bruising.  Travel history: No recent long distant travel.  Allergy:  Allergies  Allergen Reactions  . Reglan [Metoclopramide]     Develops akathisia    Past Medical History:  Diagnosis Date  . Drug abuse, cocaine type ([place]) 06/23/2019  . Hyperlipidemia   . Hypertension     Past Surgical History:  Procedure Laterality Date  . RADIOLOGY WITH ANESTHESIA N/A 06/23/2019   Procedure: MRI OF VESSELS;  Surgeon: Radiologist, Medication, MD;  Location: [facility];  Service: Radiology;  Laterality: N/A;    Social History:  reports that he has been smoking. He has been smoking about 1.00 pack per day. He has never used smokeless tobacco. He reports current alcohol use. He reports current drug use. Drugs: Marijuana, Cocaine, and Benzodiazepines.  Family History:  Family History  Problem Relation Age of Onset  . Diabetes Sister   . Diabetes Maternal Uncle      Prior to Admission medications   Medication Sig Start Date End Date Taking? Authorizing Provider  aspirin 81 MG chewable tablet Chew 1 tablet (81 mg total) by mouth daily. 06/30/19   Shelly Coss, MD  atorvastatin (LIPITOR) 40 MG tablet Take 1 tablet (40 mg total) by mouth daily at 6 PM. 06/29/19   Shelly Coss, MD  carvedilol (COREG) 25 MG tablet Take 1 tablet (25 mg total) by mouth 2 (two) times daily with a meal. 06/29/19   Shelly Coss, MD  clopidogrel (PLAVIX) 75 MG tablet Take 1 tablet (75 mg total) by mouth daily for 21 days. 06/29/19 07/20/19  Shelly Coss, MD  diltiazem (CARDIZEM) 120 MG  tablet Take 1 tablet (120 mg total) by mouth every 8 (eight) hours. 06/29/19   Shelly Coss, MD  hydrALAZINE (APRESOLINE) 20 MG/ML injection Inject 0.5 mLs (10 mg total) into the vein every 4 (four) hours as needed. 06/29/19   Shelly Coss, MD  hydrALAZINE (APRESOLINE) 50 MG tablet Take 1 tablet (50 mg total) by mouth every 8 (eight) hours. 06/29/19   Shelly Coss, MD  nicotine (NICODERM CQ -  DOSED IN MG/24 HOURS) 14 mg/24hr patch Place 1 patch (14 mg total) onto the skin daily. 06/30/19   Shelly Coss, MD    Physical Exam: Vitals:   07/07/19 1811  BP: (!) 162/91  Pulse: 84  Resp: 18  Temp: 97.6 F (36.4 C)  TempSrc: Oral  SpO2: 100%  Weight: 83.5 kg  Height: 5\' 9"  (1.753 m)   General: Not in acute distress HEENT:       Eyes: PERRL, EOMI, no scleral icterus.       ENT: No discharge from the ears and nose, no pharynx injection, no tonsillar enlargement.        Neck: No JVD, no bruit, no mass felt. Heme: No neck lymph node enlargement. Cardiac: S1/S2, RRR, No murmurs, No gallops or rubs. Respiratory: No rales, wheezing, rhonchi or rubs. GI: Soft, nondistended, nontender, no rebound pain, no organomegaly, BS present. GU: No hematuria Ext: No pitting leg edema bilaterally. 2+DP/PT pulse bilaterally. Musculoskeletal: No joint deformities, No joint redness or warmth, no limitation of ROM in spin. Skin: No rashes.  Neuro: Alert, oriented X3, cranial nerves II-XII grossly intact, moves all extremities normally. Psych: Patient is not psychotic, no suicidal or hemocidal ideation.  Labs on Admission: I have personally reviewed following labs and imaging studies  CBC: Recent Labs  Lab 07/03/19 0738  WBC 9.2  HGB 10.8*  HCT 31.7*  MCV 93.2  PLT 604   Basic Metabolic Panel: Recent Labs  Lab 07/03/19 0738 07/04/19 0859 07/05/19 0524 07/06/19 0552 07/07/19 0638 07/07/19 2039  NA 136 134* 135 135 134*  --   K 3.7 4.0 4.1 4.0 4.1  --   CL 102 101 102 101 101  --   CO2 22 21* 22 21* 22  --   GLUCOSE 110* 112* 107* 107* 108*  --   BUN 40* 40* 44* 44* 42*  --   CREATININE 7.15* 7.16* 7.46* 7.62* 7.75* 8.06*  CALCIUM 9.2 9.4 9.3 9.2 9.3  --   PHOS 4.3 4.2 4.7* 4.4 4.6  --    GFR: Estimated Creatinine Clearance: 12.9 mL/min (A) (by C-G formula based on SCr of 8.06 mg/dL (H)). Liver Function Tests: Recent Labs  Lab 07/03/19 5409 07/04/19 0859 07/05/19 0524  07/06/19 0552 07/07/19 8119  ALBUMIN 3.4* 3.4* 3.2* 3.3* 3.3*   No results for input(s): LIPASE, AMYLASE in the last 168 hours. No results for input(s): AMMONIA in the last 168 hours. Coagulation Profile: No results for input(s): INR, PROTIME in the last 168 hours. Cardiac Enzymes: No results for input(s): CKTOTAL, CKMB, CKMBINDEX, TROPONINI in the last 168 hours. BNP (last 3 results) No results for input(s): PROBNP in the last 8760 hours. HbA1C: No results for input(s): HGBA1C in the last 72 hours. CBG: No results for input(s): GLUCAP in the last 168 hours. Lipid Profile: No results for input(s): CHOL, HDL, LDLCALC, TRIG, CHOLHDL, LDLDIRECT in the last 72 hours. Thyroid Function Tests: No results for input(s): TSH, T4TOTAL, FREET4, T3FREE, THYROIDAB in the last 72 hours. Anemia Panel: No results for input(s): VITAMINB12,  FOLATE, FERRITIN, TIBC, IRON, RETICCTPCT in the last 72 hours. Urine analysis:    Component Value Date/Time   COLORURINE STRAW (A) 07/03/2019 2320   APPEARANCEUR CLEAR 07/03/2019 2320   LABSPEC 1.004 (L) 07/03/2019 2320   PHURINE 7.0 07/03/2019 2320   GLUCOSEU NEGATIVE 07/03/2019 2320   HGBUR NEGATIVE 07/03/2019 2320   BILIRUBINUR NEGATIVE 07/03/2019 2320   KETONESUR NEGATIVE 07/03/2019 2320   PROTEINUR 100 (A) 07/03/2019 2320   NITRITE NEGATIVE 07/03/2019 2320   LEUKOCYTESUR NEGATIVE 07/03/2019 2320   Sepsis Labs: @LABRCNTIP (procalcitonin:4,lacticidven:4) )No results found for this or any previous visit (from the past 240 hour(s)).   Radiological Exams on Admission: No results found.   EKG: will get one.   Assessment/Plan Principal Problem:   Acute kidney injury ([place]) Active Problems:   HTN (hypertension)   Stroke ([place])   HLD (hyperlipidemia)   Polysubstance abuse ([place])   Tobacco abuse  Acute kidney injury ([place]):  Pt initially had creatinine 4.95, BUN 25. He still has creatinine 7.75, BUN 42 today. Renal is on board. Per Dr. Joelyn Oms, GN  work-up unrevealing, not need HD now.   -will admit to tele bed as inpt -strict in and out monitoring -f/u [facility] -f/u renal's recommendations -Avoid renal toxic medications. -Per Dr. Adin Hector note:  Medication Issues; ? Preferred narcotic agents for pain control are hydromorphone, fentanyl, and methadone. Morphine should not be used.  ? Baclofen should be avoided ? Avoid oral sodium phosphate and magnesium citrate based laxatives / bowel preps   HTN (hypertension): pt had hypertensive urgency initially.  Currently blood pressures are 162/91. -IV hydralazine. - Amlodipine, hydralazine, Coreg, spironolactone  Recent Stroke [facility]): Complete inpatient rehab.  No acute issues. -Continue aspirin, Plavix, Lipitor  HLD (hyperlipidemia): -lipitor  Polysubstance abuse ([place]) andTobacco abuse: -Did counseling about importance of quitting substance use. -Nicotine patch   Inpatient status:  # Patient requires inpatient status due to high intensity of service, high risk for further deterioration and high frequency of surveillance required.  I certify that at the point of admission it is my clinical judgment that the patient will require inpatient hospital care spanning beyond 2 midnights from the point of admission.   This patient has multiple chronic comorbidities including hypertension, hyperlipidemia, polysubstance abuse ( tobacco, cocaine, marijuana, benzo), recently hospitalized from 7/8-7/16 due to acute stroke, hypertensive urgency, acute renal injury. . Now patient is transferred from inpatient rehab to floor due to persistent acute renal injury. . The initial radiographic and laboratory data are worrisome because of persistent elevation for creatinine and BUN. . Current medical needs: please see my assessment and plan . Predictability of an adverse outcome (risk): Patient has multiple comorbidities. Recently hospitalized from 7/8-7/16 due to acute stroke, hypertensive urgency, acute  renal injury. Pt has persistently elevated creatinine and BUN.  Creatinine is 7.75, BUN 42, which is worse than the initial presentation.  It is very likely that the patient will need to be treated in hospital for at least 2 days to get renal function back to close to baseline.  If patient kidney function is worsening, patient may need dialysis.    DVT ppx: SQ Heparin  Code Status: Full code Family Communication: None at bed side Disposition Plan:  Anticipate discharge back to previous home environment Consults called:  Dr. Lajoyce Lauber of renal Admission status:   Inpatient/tele     Date of Service 07/07/2019    [facility]   If 7PM-7AM, please contact night-coverage [email or web address] Password [facility] 07/07/2019, 9:56 PM

## 2019-07-07 NOTE — Progress Notes (Signed)
Physical Therapy Session Note  Patient Details  Name: William [name] MRN: [id number] Date of Birth: 10/27/1986  Today's Date: 07/07/2019 PT Individual Time: 0830-0915 PT Individual Time Calculation (min): 45 min   Short Term Goals: Week 1:  PT Short Term Goal 1 (Week 1): STG = LTG due to short ELOS.  Skilled Therapeutic Interventions/Progress Updates:  Pt received asleep in bed but awakened & pt reporting he wishes to eat breakfast & wash up. Therapist provided pt with time & returned shortly later. Pt received in room, finishing grooming tasks standing at sink. Pt spilled liquid on pants so changed clothing independently in room. Pt ambulates around unit without AD and independently. Pt negotiates 12 steps (6") with 1 rail and supervision with improved safety and min cuing. Pt performs standing cone taps with supervision, knocking cones over 2x with LLE and seated rest breaks 2/2 fatigue. Pt transitioned to quadruped on mat table and performed bird dog exercises with continued impaired balance and trunk control but only requiring supervision on this date with instructional cuing for technique. Pt utilizes [employer] Limits of Stability without BUE support with compliant surface & min assist for balance with task focusing on balance & weight shifting in all directions. Pt reports fatigue but utilizes nu-step on level 8 x 7 minutes with all four extremities to focus on coordination of reciprocal movements & global strengthening. At end of session pt left in room.   Pain: Pt reports "everything hurts" then elaborates & says he has B shoulder soreness with rest breaks & repositioning provided.  Therapy Documentation Precautions:  Precautions Precautions: None Precaution Comments: monitor BP; R inattention Restrictions Weight Bearing Restrictions: No   General: PT Amount of Missed Time (min): 30 Minutes PT Missed Treatment Reason: (breakfast)    Therapy/Group: Individual Therapy  [name] 07/07/2019, 9:18 AM

## 2019-07-07 NOTE — Plan of Care (Signed)
  Problem: RH KNOWLEDGE DEFICIT Goal: RH STG INCREASE KNOWLEDGE OF HYPERTENSION Description: Pt will be able to demonstrate understanding of hypertension medications and defined blood pressure parameters. Pt will demonstrate understanding diet restrictions and modifications for blood pressure control using handout provided with cues and reminder assist.  Outcome: Progressing Goal: RH STG INCREASE KNOWLEGDE OF HYPERLIPIDEMIA Description: Pt will be able to demonstrate understanding of antihyperlipidemic medications. Pt will demonstrate understanding diet restrictions and modifications for cholesterol control using handout provided with cues and reminder assist.  Outcome: Progressing   Problem: Consults Goal: RH STROKE PATIENT EDUCATION Description: See Patient Education module for education specifics  Outcome: Completed/Met   Problem: RH BOWEL ELIMINATION Goal: RH STG MANAGE BOWEL WITH ASSISTANCE Description: STG Manage Bowel with mod I Assistance. Outcome: Completed/Met   Problem: RH SAFETY Goal: RH STG ADHERE TO SAFETY PRECAUTIONS W/ASSISTANCE/DEVICE Description: STG Adhere to Safety Precautions With supervision/cues Assistance/Device. Outcome: Completed/Met   Problem: RH PAIN MANAGEMENT Goal: RH STG PAIN MANAGED AT OR BELOW PT'S PAIN GOAL Description: Pain less than 3 on scale of 0-10 Outcome: Completed/Met

## 2019-07-07 NOTE — Plan of Care (Signed)
  Problem: RH KNOWLEDGE DEFICIT Goal: RH STG INCREASE KNOWLEDGE OF HYPERTENSION Description: Pt will be able to demonstrate understanding of hypertension medications and defined blood pressure parameters. Pt will demonstrate understanding diet restrictions and modifications for blood pressure control using handout provided with cues and reminder assist.  Outcome: completed/met Problem: RH KNOWLEDGE DEFICIT Goal: RH STG INCREASE KNOWLEGDE OF HYPERLIPIDEMIA Description: Pt will be able to demonstrate understanding of antihyperlipidemic medications. Pt will demonstrate understanding diet restrictions and modifications for cholesterol control using handout provided with cues and reminder assist.  Outcome: completed/met

## 2019-07-07 NOTE — Progress Notes (Signed)
Occupational Therapy Discharge Summary  Patient Details  Name: William [name] MRN: [id number] Date of Birth: Jun 27, 1986  Patient has met 58 of 11 long term goals due to improved activity tolerance, improved balance, postural control, improved attention, improved awareness and improved coordination.  Patient to discharge at overall Modified Independent level.  Patient's care partner is independent to provide the necessary cognitive assistance at discharge.    Reasons goals not met: n/a  Recommendation:  Patient will benefit from ongoing skilled OT services in OPOT to continue to advance functional skills in the area of BADL and iADL.  Equipment: shower chair  Reasons for discharge: treatment goals met  Patient/family agrees with progress made and goals achieved: Yes  OT Discharge Precautions/Restrictions  Precautions Precautions: None Precaution Comments: monitor BP; R inattention Restrictions Weight Bearing Restrictions: No General   Vital Signs Therapy Vitals Temp: 98 F (36.7 C) Temp Source: Oral Pulse Rate: 76 Resp: 16 BP: (!) 174/110 Patient Position (if appropriate): Lying Oxygen Therapy SpO2: 100 % O2 Device: Room Air Pain Pain Assessment Pain Score: 0-No pain ADL ADL Eating: Independent Grooming: Independent Upper Body Bathing: Independent Lower Body Bathing: Independent Upper Body Dressing: Independent Lower Body Dressing: Independent Toileting: Independent Toilet Transfer: Independent Tub/Shower Transfer: Independent Vision Baseline Vision/History: Wears glasses Wears Glasses: Reading only Patient Visual Report: No change from baseline Vision Assessment?: No apparent visual deficits Perception  Perception: Within Functional Limits Praxis Praxis: Intact Cognition Overall Cognitive Status: Impaired/Different from baseline Arousal/Alertness: Awake/alert Orientation Level: Oriented X4 Attention: Sustained Sustained Attention:  Impaired Memory: Impaired Memory Impairment: Decreased short term memory;Decreased recall of new information Awareness: Impaired Awareness Impairment: Anticipatory impairment Problem Solving: Impaired Problem Solving Impairment: Functional complex Executive Function: Self Monitoring;Self Correcting Organizing: Impaired Initiating: Impaired Self Monitoring: Impaired Behaviors: Impulsive Comments: Pt with decreased safety awareness, impulsive and with decreased awareness into deficits and functional implications. Sensation Sensation Light Touch: Appears Intact Proprioception: Appears Intact Coordination Gross Motor Movements are Fluid and Coordinated: Yes Fine Motor Movements are Fluid and Coordinated: Yes Finger Nose Finger Test: Sightly decreased speed and accuracy with R UE Motor  Motor Motor: Abnormal postural alignment and control Motor - Skilled Clinical Observations: mild R hemiparesis Motor - Discharge Observations: mild R hemiparesis, deconditioning (pt fatigues with higher level exercises) Mobility  Bed Mobility Bed Mobility: Rolling Right;Rolling Left;Sit to Supine;Supine to Sit Rolling Right: Independent Rolling Left: Independent Supine to Sit: Independent Sit to Supine: Independent Transfers Stand to Sit: Independent  Trunk/Postural Assessment  Cervical Assessment Cervical Assessment: Within Functional Limits Thoracic Assessment Thoracic Assessment: Within Functional Limits Lumbar Assessment Lumbar Assessment: Within Functional Limits Postural Control Postural Control: Within Functional Limits  Balance Balance Balance Assessed: Yes Extremity/Trunk Assessment RUE Assessment RUE Assessment: Within Functional Limits LUE Assessment LUE Assessment: Within Functional Limits   [place] 07/07/2019, 6:50 AM

## 2019-07-08 DIAGNOSIS — N179 Acute kidney failure, unspecified: Secondary | ICD-10-CM | POA: Diagnosis not present

## 2019-07-08 DIAGNOSIS — E785 Hyperlipidemia, unspecified: Secondary | ICD-10-CM | POA: Diagnosis not present

## 2019-07-08 DIAGNOSIS — D638 Anemia in other chronic diseases classified elsewhere: Secondary | ICD-10-CM

## 2019-07-08 DIAGNOSIS — I10 Essential (primary) hypertension: Secondary | ICD-10-CM | POA: Diagnosis not present

## 2019-07-08 DIAGNOSIS — Z720 Tobacco use: Secondary | ICD-10-CM

## 2019-07-08 DIAGNOSIS — F1910 Other psychoactive substance abuse, uncomplicated: Secondary | ICD-10-CM | POA: Diagnosis not present

## 2019-07-08 LAB — CBC
HCT: 28.4 % — ABNORMAL LOW (ref 39.0–52.0)
Hemoglobin: 9.5 g/dL — ABNORMAL LOW (ref 13.0–17.0)
MCH: 32 pg (ref 26.0–34.0)
MCHC: 33.5 g/dL (ref 30.0–36.0)
MCV: 95.6 fL (ref 80.0–100.0)
Platelets: 385 10*3/uL (ref 150–400)
RBC: 2.97 MIL/uL — ABNORMAL LOW (ref 4.22–5.81)
RDW: 12.2 % (ref 11.5–15.5)
WBC: 9.2 10*3/uL (ref 4.0–10.5)
nRBC: 0 % (ref 0.0–0.2)

## 2019-07-08 LAB — RENAL FUNCTION PANEL
Albumin: 3.4 g/dL — ABNORMAL LOW (ref 3.5–5.0)
Anion gap: 11 (ref 5–15)
BUN: 47 mg/dL — ABNORMAL HIGH (ref 6–20)
CO2: 21 mmol/L — ABNORMAL LOW (ref 22–32)
Calcium: 9.2 mg/dL (ref 8.9–10.3)
Chloride: 100 mmol/L (ref 98–111)
Creatinine, Ser: 7.58 mg/dL — ABNORMAL HIGH (ref 0.61–1.24)
GFR calc Af Amer: 10 mL/min — ABNORMAL LOW (ref >60)
GFR calc non Af Amer: 8 mL/min — ABNORMAL LOW (ref >60)
Glucose, Bld: 109 mg/dL — ABNORMAL HIGH (ref 70–99)
Phosphorus: 4.6 mg/dL (ref 2.5–4.6)
Potassium: 4 mmol/L (ref 3.5–5.1)
Sodium: 132 mmol/L — ABNORMAL LOW (ref 135–145)

## 2019-07-08 MED ORDER — NICOTINE 7 MG/24HR TD PT24: 7.0000 mg | MEDICATED_PATCH | Freq: Every day | TRANSDERMAL | 0 refills | Status: DC

## 2019-07-08 MED ORDER — AMLODIPINE BESYLATE 10 MG PO TABS: 10.0000 mg | ORAL_TABLET | Freq: Every day | ORAL | 0 refills | Status: AC

## 2019-07-08 MED ORDER — SPIRONOLACTONE 25 MG PO TABS: 25.0000 mg | ORAL_TABLET | Freq: Every day | ORAL | 3 refills | Status: DC

## 2019-07-08 MED ORDER — HYDRALAZINE HCL 100 MG PO TABS: 100.0000 mg | ORAL_TABLET | Freq: Three times a day (TID) | ORAL | 3 refills | Status: DC

## 2019-07-08 MED ORDER — CLOPIDOGREL BISULFATE 75 MG PO TABS: 75.0000 mg | ORAL_TABLET | Freq: Every day | ORAL | 0 refills | Status: DC

## 2019-07-08 NOTE — Evaluation (Signed)
Occupational Therapy Evaluation Patient Details Name: William [name] MRN: [id number] DOB: [birth year] Today's Date: 07/08/2019    History of Present Illness 33 year old male with a history of hypertension who initially presented with chest tightness, shortness of breath, and intermittent headaches.  Wife also noted decreased energy, sluggish appearing, and constipation. BP in ED 254/167. Admitted to ICU 06/22/19.  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 Pt with elevated troponin assessed by cardiology to be likely due to demand ischemia. Pt transferred to Rehab 7/16- 7/24 and transferred to Acute for kidney issues.     Clinical Impression   Patient evaluated by Occupational Therapy with no further acute OT needs identified. All education has been completed and the patient has no further questions. Pt is able to perform ADLs independently, but requires assist for IADLs.  He demonstrates impaired memory, difficulty with 3 step commands, as well as deficits with executive functions.  PTA, he was fully independent, lives with his wife and children.  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  He hopes to be able to return to work and will need follow up OT, PT, SLP to address remaining deficits and assist with successful transition into community and work related activities. See below for any follow-up Occupational Therapy or equipment needs. OT is signing off. Thank you for this referral.      Follow Up Recommendations  Outpatient OT, PT, SLP (Pt demonstrates deficits with high level cognition, balance, and endurance needed for work and community activities) ;Supervision - Intermittent(OP SLP )    Equipment Recommendations       Recommendations for Other Services       Precautions / Restrictions  Precautions Precautions: None Restrictions Weight Bearing Restrictions: No      Mobility Bed Mobility Overal bed mobility: Independent                Transfers Overall transfer level: Independent                    Balance Overall balance assessment: Mild deficits observed, not formally tested Sitting-balance support: No upper extremity supported;Feet supported Sitting balance-Leahy Scale: Good     Standing balance support: No upper extremity supported;During functional activity Standing balance-Leahy Scale: Good                   Standardized Balance Assessment Standardized Balance Assessment : Dynamic Gait Index   Dynamic Gait Index Level Surface: Normal Change in Gait Speed: Normal Gait with Horizontal Head Turns: Normal Gait with Vertical Head Turns: Normal Gait and Pivot Turn: Normal Step Over Obstacle: Mild Impairment Step Around Obstacles: Mild Impairment Steps: Mild Impairment Total Score: 21     ADL either performed or assessed with clinical judgement   ADL Overall ADL's : Modified independent                                       General ADL Comments: Pt completed ADL this am without assist      Vision Baseline Vision/History: Wears glasses Wears Glasses: Reading only Patient Visual Report: No change from baseline Vision Assessment?: No apparent visual deficits     Perception Perception Perception Tested?: Yes   Praxis      Pertinent Vitals/Pain Pain Assessment: No/denies pain  Hand Dominance Left   Extremity/Trunk Assessment Upper Extremity Assessment Upper Extremity Assessment: Overall WFL for tasks assessed   Lower Extremity Assessment Lower Extremity Assessment: Overall WFL for tasks assessed       Communication Communication Communication: No difficulties   Cognition Arousal/Alertness: Awake/alert Behavior During Therapy: WFL for tasks assessed/performed Overall Cognitive Status:  Impaired/Different from baseline Area of Impairment: Attention;Memory;Following commands;Problem solving                   Current Attention Level: Alternating;Divided Memory: Decreased short-term memory Following Commands: Follows one step commands consistently;Follows multi-step commands inconsistently   Awareness: Emergent   General Comments: Pt required mod cues to follow 3 step commands.  He He required min cues for scavenger hunt.       General Comments       Exercises     Shoulder Instructions      Home Living Family/patient expects to be discharged to:: Private residence Living Arrangements: Spouse/significant other;Children Available Help at Discharge: Family;Available 24 hours/day Type of Home: House Home Access: Stairs to enter(chart states 2 STE without rails, but pt reports 3 steps with L rail/doorframe) Entrance Stairs-Number of Steps: 2 Entrance Stairs-Rails: None Home Layout: One level     Bathroom Shower/Tub: Corporate investment banker: Standard Bathroom Accessibility: Yes              Prior Functioning/Environment Level of Independence: Independent        Comments: producing music, 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   has 2 pitbulls        OT Problem List: Decreased activity tolerance;Impaired balance (sitting and/or standing);Decreased cognition      OT Treatment/Interventions:      OT Goals(Current goals can be found in the care plan section) Acute Rehab OT Goals Patient Stated Goal: to go home and follow and go to OP to get strength and independence back  OT Goal Formulation: All assessment and education complete, DC therapy  OT Frequency:     Barriers to D/C:            Co-evaluation              AM-PAC OT "6 Clicks" Daily Activity     Outcome Measure Help from another person eating meals?: None Help from another person taking care of personal grooming?: None Help from another person toileting, which  includes using toliet, bedpan, or urinal?: None Help from another person bathing (including washing, rinsing, drying)?: None Help from another person to put on and taking off regular upper body clothing?: None Help from another person to put on and taking off regular lower body clothing?: None 6 Click Score: 24   End of Session    Activity Tolerance: Patient tolerated treatment well Patient left: in bed  OT Visit Diagnosis: Cognitive communication deficit (R41.841) Symptoms and signs involving cognitive functions: Cerebral infarction                Time: 1751-0258 OT Time Calculation (min): 11 min Charges:  OT General Charges $OT Visit: 1 Visit OT Evaluation $OT Eval Moderate Complexity: 1 Mod Lucille Passy, OTR/L Acute Rehabilitation Services Pager [phone] Office [phone]    Lucille Passy M 07/08/2019, 11:13 AM

## 2019-07-08 NOTE — Progress Notes (Signed)
Admit: 07/07/2019 LOS: 44  33 year old male with AKI, hypertensive emergency, cocaine use, acute CVA; in CIR.  Subjective:  . No interval events.  No complaints. No N/V/dysgeusia, itching, hiccups.  . Serum creatinine stable at 7.58, potassium 4.0, bicarbonate 21, BUN 47 . BP stable this morning  07/24 0701 - 07/25 0700 In: 180 [P.O.:180] Out: 400 [Urine:400]  Filed Weights   07/07/19 1811  Weight: 83.5 kg    Scheduled Meds: . amLODipine  10 mg Oral QHS  . aspirin  81 mg Oral Daily  . atorvastatin  40 mg Oral q1800  . carvedilol  25 mg Oral BID WC  . clopidogrel  75 mg Oral Daily  . heparin injection (subcutaneous)  5,000 Units Subcutaneous Q8H  . hydrALAZINE  100 mg Oral Q8H  . nicotine  14 mg Transdermal Daily  . spironolactone  25 mg Oral Daily   Continuous Infusions: PRN Meds:.acetaminophen, aluminum hydroxide, bisacodyl, diphenhydrAMINE, guaiFENesin-dextromethorphan, hydrALAZINE, ondansetron (ZOFRAN) IV, polyethylene glycol, prochlorperazine **OR** prochlorperazine **OR** prochlorperazine, senna-docusate, simethicone, sodium phosphate, traZODone  Current Labs: reviewed  Results for HANISH, LARAIA (MRN [id number]) as of 07/05/2019 13:36  Ref. Range 06/28/2019 10:38 07/03/2019 23:20  Protein Creatinine Ratio Latest Ref Range: 0.00 - 0.15 mg/mgCre 2.56 (H) 1.41 (H)    Ref. Range 06/26/2019 08:53  ALDOSTERONE Latest Ref Range: 0.0 - 30.0 ng/dL 6.7  PRA LC/MS/MS Latest Ref Range: 0.167 - 5.380 ng/mL/hr 23.604 (H)  Results for SHANN, MERRICK (MRN [id number]) as of 07/04/2019 14:32  Ref. Range 07/01/2019 05:46  Anti Nuclear Antibody (ANA) Latest Ref Range: Negative  Negative  GBM Ab Latest Ref Range: 0 - 20 units 2  Cytoplasmic (C-ANCA) Latest Ref Range: Neg:<1:20 titer <1:20  P-ANCA Latest Ref Range: Neg:<1:20 titer <1:20  Atypical P-ANCA titer Latest Ref Range: Neg:<1:20 titer <1:20  C3 Complement Latest Ref Range: 82 - 167 mg/dL 136  Complement C4, Body Fluid Latest Ref  Range: 14 - 44 mg/dL 37    Physical Exam:  Blood pressure (!) 144/92, pulse 78, temperature 98.2 F (36.8 C), temperature source Oral, resp. rate 18, height 5\' 9"  (1.753 m), weight 83.5 kg, SpO2 100 %. NAD, WA/NAD RRR, normal S1 and S2 CTA B No lower extremity edema Conversant, alert and oriented NCAT EOMI  A 1. Severe AKI, not uremic, has not received dialysis.  Urine protein creatinine ratio 1.4 and 2.5.  Subnephrothic. GN work-up unrevealing, as above.  I don't think that renal biopsy would change plans a this time.  Serum creatinine 1.1 on 12/2017 2. Hypertension, improving control 3. Status post CVA, and CIR 4. Cocaine use 5. Hypokalemia, improved with spironolactone, monitor  P . I think stable for discharge  . Will need close follow-up at our office, he is aware . We will reach out early next week for lab and follow-up appointments . Our contact information has been added to discharge paperwork . medication Issues; o Preferred narcotic agents for pain control are hydromorphone, fentanyl, and methadone. Morphine should not be used.  o Baclofen should be avoided o Avoid oral sodium phosphate and magnesium citrate based laxatives / bowel preps    Pearson Grippe MD 07/08/2019, 11:47 AM  Recent Labs  Lab 07/06/19 0552 07/07/19 0638 07/07/19 2039 07/08/19 0246  NA 135 134*  --  132*  K 4.0 4.1  --  4.0  CL 101 101  --  100  CO2 21* 22  --  21*  GLUCOSE 107* 108*  --  109*  BUN 44* 42*  --  47*  CREATININE 7.62* 7.75* 8.06* 7.58*  CALCIUM 9.2 9.3  --  9.2  PHOS 4.4 4.6  --  4.6   Recent Labs  Lab 07/03/19 0738 07/08/19 0246  WBC 9.2 9.2  HGB 10.8* 9.5*  HCT 31.7* 28.4*  MCV 93.2 95.6  PLT 346 385

## 2019-07-08 NOTE — Discharge Summary (Signed)
Physician Discharge Summary  William [name] HAL:[id number] DOB: [birth year] DOA: 07/07/2019  PCP: Patient, No Pcp Per  Admit date: 07/07/2019 Discharge date: 07/08/2019  Admitted From: home Disposition:  home   Recommendations for Outpatient Follow-up:  1. F/u on CBC in 3-4 wks 2. Continue to encourage compliance with medications and abstinence from drugs     Discharge Condition:  stable   CODE STATUS:  Full code   Diet recommendation:  Heart healthy Consultations:  nephrology    Discharge Diagnoses:  Principal Problem:   Acute kidney injury ([place]) Active Problems:   Uncontrolled hypertension   Stroke ([place])   HLD (hyperlipidemia)   Polysubstance abuse ([place])   Tobacco abuse   Anemia of chronic disease    Subjective: He has no complaints. Urinating well without trouble.   Brief Narrative:  William [name] is a 33 y/o male with Marijauna and Cocaine abuse, HTN, HLD who was admitted from 7/8-7/16 at [facility] for hypertensive urgency, Acute CVAs in b/l anterior and posterior circulation, PRESS, hyponatremia, AKI and elevated troponin due to demand ischemia.  He was discharged to [facility] where his AKI continued to worsen. He was going to be discharged from [facility] however, due to persistently rising Cr, it was decided to admit him back to [facility].     Assessment & Plan:   Principal Problem:   Acute kidney injury  Lab Results  Component Value Date   CREATININE 7.58 (H) 07/08/2019   CREATININE 8.06 (H) 07/07/2019   CREATININE 7.75 (H) 07/07/2019  - no needs for dialysis yet-  He is making good urine output and he is not uremic - nephrology following- Dr Joelyn Oms has contacted me today and feels that the patietn can be discharged- he will arrange close follow up in the office.    Active Problems:   HTN - recent hypertensive urgency- was non- compliant with meds - currently receiving Amlodipine, Carvedilol, Hydrazine, Spironolactone - cont to follow as outpt  Recent  CVA - cont Plavix for a total of 21 days- should be able to stop this on 7/29 and I have prescribed him 4 days worth - cont aspirin- he completed rehab at [facility] yesterday    HLD (hyperlipidemia) - cont Lipitor    Polysubstance abuse   Tobacco abuse - has been counseled - cont Nicotine patch  Anemia - likely of chronic disease- may need Aranesp/ Procrit f/u as outpt    Discharge Exam: Vitals:   07/07/19 2345 07/08/19 0454  BP: (!) 157/99 (!) 144/92  Pulse:  78  Resp:  18  Temp:  98.2 F (36.8 C)  SpO2:  100%   Vitals:   07/07/19 1811 07/07/19 2345 07/08/19 0454  BP: (!) 162/91 (!) 157/99 (!) 144/92  Pulse: 84  78  Resp: 18  18  Temp: 97.6 F (36.4 C)  98.2 F (36.8 C)  TempSrc: Oral  Oral  SpO2: 100%  100%  Weight: 83.5 kg    Height: 5\' 9"  (1.753 m)      General: Pt is alert, awake, not in acute distress Cardiovascular: RRR, S1/S2 +, no rubs, no gallops Respiratory: CTA bilaterally, no wheezing, no rhonchi Abdominal: Soft, NT, ND, bowel sounds + Extremities: no edema, no cyanosis   Discharge Instructions  Discharge Instructions    Diet - low sodium heart healthy   Complete by: As directed    Increase activity slowly   Complete by: As directed      Allergies as of 07/08/2019  Reactions   Reglan [metoclopramide]    Develops akathisia      Medication List    STOP taking these medications   diltiazem 120 MG tablet Commonly known as: CARDIZEM   nicotine 14 mg/24hr patch Commonly known as: NICODERM CQ - dosed in mg/24 hours Replaced by: nicotine 7 mg/24hr patch     TAKE these medications   amLODipine 10 MG tablet Commonly known as: NORVASC Take 1 tablet (10 mg total) by mouth at bedtime.   aspirin 81 MG chewable tablet Chew 1 tablet (81 mg total) by mouth daily.   atorvastatin 40 MG tablet Commonly known as: LIPITOR Take 1 tablet (40 mg total) by mouth daily at 6 PM.   carvedilol 25 MG tablet Commonly known as: COREG Take 1 tablet (25  mg total) by mouth 2 (two) times daily with a meal.   clopidogrel 75 MG tablet Commonly known as: PLAVIX Take 1 tablet (75 mg total) by mouth daily. Start taking on: July 09, 2019   hydrALAZINE 100 MG tablet Commonly known as: APRESOLINE Take 1 tablet (100 mg total) by mouth every 8 (eight) hours. What changed:   medication strength  how much to take  Another medication with the same name was removed. Continue taking this medication, and follow the directions you see here.   nicotine 7 mg/24hr patch Commonly known as: NICODERM CQ - dosed in mg/24 hr Place 1 patch (7 mg total) onto the skin daily. Replaces: nicotine 14 mg/24hr patch   spironolactone 25 MG tablet Commonly known as: ALDACTONE Take 1 tablet (25 mg total) by mouth daily. Start taking on: July 09, 2019       Allergies  Allergen Reactions  . Reglan [Metoclopramide]     Develops akathisia     Procedures/Studies:    Ct Abdomen Wo Contrast  Result Date: 06/25/2019 CLINICAL DATA:  Renal failure. Renal infarcts suggested by ultrasound. EXAM: CT ABDOMEN WITHOUT CONTRAST TECHNIQUE: Multidetector CT imaging of the abdomen was performed following the standard protocol without IV contrast. COMPARISON:  Renal ultrasound June 23, 2019 FINDINGS: Lower chest: No acute abnormality. Hepatobiliary: No focal liver abnormality is seen. Question gallbladder wall thickening with small amount of pericholecystic fluid. Pancreas: Unremarkable. No pancreatic ductal dilatation or surrounding inflammatory changes. Spleen: Normal in size without focal abnormality. Adrenals/Urinary Tract: Adrenal glands are unremarkable. Kidneys are without renal calculi, focal lesion, or hydronephrosis. Minimal bilateral perirenal fat stranding. Stomach/Bowel: Stomach is within normal limits. Appendix appears normal. No evidence of bowel wall thickening, distention, or inflammatory changes. Vascular/Lymphatic: No significant vascular findings are present. No  enlarged abdominal or pelvic lymph nodes. Other: No abdominal wall hernia or abnormality. Musculoskeletal: No acute or significant osseous findings. IMPRESSION: 1. Question gallbladder wall thickening with small amount of pericholecystic fluid. Further evaluation with right upper quadrant ultrasound may be considered. 2. Minimal bilateral perirenal fat stranding, nonspecific. Electronically Signed   By: Fidela Salisbury M.D.   On: 06/25/2019 19:48   Dg Chest 2 View  Result Date: 06/21/2019 CLINICAL DATA:  Dyspnea for 3 days, smoker EXAM: CHEST - 2 VIEW COMPARISON:  None. FINDINGS: Normal heart size. Normal mediastinal contour. No pneumothorax. No pleural effusion. Lungs appear clear, with no acute consolidative airspace disease and no pulmonary edema. IMPRESSION: No active cardiopulmonary disease. Electronically Signed   By: Ilona Sorrel M.D.   On: 06/21/2019 17:21   Ct Head Wo Contrast  Result Date: 06/22/2019 CLINICAL DATA:  Headache. Hypertensive emergency. Significantly elevated blood pressure. EXAM: CT HEAD WITHOUT  CONTRAST TECHNIQUE: Contiguous axial images were obtained from the base of the skull through the vertex without intravenous contrast. COMPARISON:  None. FINDINGS: Brain: No evidence of acute infarction, hemorrhage, hydrocephalus, extra-axial collection or mass lesion/mass effect. Patchy areas of low-density in the bilateral caudate, periventricular white matter, and right thalamus are nonspecific. Vascular: No hyperdense vessel or unexpected calcification. Skull: No fracture or focal lesion. Sinuses/Orbits: Small mucous retention cyst in both maxillary sinuses. The paranasal sinuses and mastoid air cells are otherwise clear. The visualized orbits are unremarkable. Other: None. IMPRESSION: 1. Patchy areas of low-density in the bilateral caudate, periventricular white matter, and right thalamus are nonspecific and may represent chronic microvascular ischemic changes,, however would be unusual  for patient age. Demyelinating disease such as multiple sclerosis also considered. Consider MRI for further evaluation. 2. No acute hemorrhage. Electronically Signed   By: Keith Rake M.D.   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 differential considerations including posterior reversible encephalopathy syndrome (PRES), osmotic demyelination, ischemic or embolic infarcts. EXAM: MRA NECK WITHOUT CONTRAST MRA HEAD WITHOUT CONTRAST TECHNIQUE: Angiographic images of the neck were obtained using MRA technique without intravenous contast. Angiographic images of the Circle of Willis were obtained using MRA technique without intravenous contrast. COMPARISON:  Brain MRI 06/22/2019. FINDINGS: MRA NECK FINDINGS Time-of-flight neck MRA images reveal antegrade flow in both cervical carotid and vertebral arteries throughout the neck and to the skull base. The left vertebral might arise directly from the arch (series 4, image 184) and is mildly dominant in the neck. The carotid bifurcations appear normal. No carotid or vertebral artery stenosis identified in the neck. MRA HEAD FINDINGS No intracranial mass effect or ventriculomegaly. Antegrade flow in the posterior circulation with dominant left vertebral artery. The right vertebral is patent to the vertebrobasilar junction, but is diminutive beyond the PICA. Both PICA origins are patent. Patent basilar artery without stenosis. Normal SCA origins. Fetal type PCA origins, more so the right. Normal posterior communicating arteries. Bilateral PCA branches appear normal. Antegrade flow in both ICA siphons. No siphon stenosis. Normal ophthalmic and posterior communicating artery origins. Patent carotid termini. Normal MCA and ACA origins. The left A1 is dominant. Anterior communicating artery appears fenestrated  (normal variant). Visible bilateral ACA branches appear normal. Left MCA M1 segment, bifurcation and visible left MCA branches are within normal limits. Right MCA M1, bifurcation, and visible right MCA branches appear normal. IMPRESSION: Negative MRA of the head and neck. Electronically Signed   By: Genevie Ann M.D.   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 differential considerations including posterior reversible encephalopathy syndrome (PRES), osmotic demyelination, ischemic or embolic infarcts. EXAM: MRA NECK WITHOUT CONTRAST MRA HEAD WITHOUT CONTRAST TECHNIQUE: Angiographic images of the neck were obtained using MRA technique without intravenous contast. Angiographic images of the Circle of Willis were obtained using MRA technique without intravenous contrast. COMPARISON:  Brain MRI 06/22/2019. FINDINGS: MRA NECK FINDINGS Time-of-flight neck MRA images reveal antegrade flow in both cervical carotid and vertebral arteries throughout the neck and to the skull base. The left vertebral might arise directly from the arch (series 4, image 184) and is mildly dominant in the neck. The carotid bifurcations appear normal. No carotid or vertebral artery  stenosis identified in the neck. MRA HEAD FINDINGS No intracranial mass effect or ventriculomegaly. Antegrade flow in the posterior circulation with dominant left vertebral artery. The right vertebral is patent to the vertebrobasilar junction, but is diminutive beyond the PICA. Both PICA origins are patent. Patent basilar artery without stenosis. Normal SCA origins. Fetal type PCA origins, more so the right. Normal posterior communicating arteries. Bilateral PCA branches appear normal. Antegrade flow in both ICA siphons. No siphon stenosis. Normal ophthalmic and posterior communicating artery origins. Patent  carotid termini. Normal MCA and ACA origins. The left A1 is dominant. Anterior communicating artery appears fenestrated (normal variant). Visible bilateral ACA branches appear normal. Left MCA M1 segment, bifurcation and visible left MCA branches are within normal limits. Right MCA M1, bifurcation, and visible right MCA branches appear normal. IMPRESSION: Negative MRA of the head and neck. Electronically Signed   By: Genevie Ann M.D.   On: 06/23/2019 18:22   Mr Brain Wo Contrast  Result Date: 06/22/2019 CLINICAL DATA:  34 year old male with malignant hypertension. Headache. Abnormal CT appearance of the brain earlier today. EXAM: MRI HEAD WITHOUT CONTRAST TECHNIQUE: Multiplanar, multiecho pulse sequences of the brain and surrounding structures were obtained without intravenous contrast. COMPARISON:  Head CT 0209 hours today. FINDINGS: Brain: There are fairly numerous mostly small areas of restricted diffusion scattered in the brainstem and bilateral hemispheres. Among the largest areas is that in the medial left periatrial white matter near the splenium of the corpus callosum on series 2, image 26. There is involvement of the bilateral deep white matter capsules. There are a few scattered areas of small cortical involvement. There is scattered subcortical white matter involvement. And there is globular restricted diffusion in multiple areas of the pons and the pontomedullary junction (series 2, images 12-15). Superimposed on the abnormal pontine diffusion is diffuse pontomedullary T2 and FLAIR hyperintensity with expansion and generally facilitated diffusion. And there is a similar abnormal appearance of the medial thalami (series 5, image 14). Extensive superimposed bilateral T2 and FLAIR hyperintensity in the basal ganglia appears to represent a combination of acute and chronic abnormality, with small areas of cystic encephalomalacia. There is no posterior circulation predominant cortical gray and white matter  involvement typical of posterior reversible encephalopathy syndrome (PRES). Questionable subtle acute involvement of the cerebellum. There is also a tiny chronic right cerebellar infarct on series 5, image 8. There are superimposed chronic microhemorrhages in the bilateral deep gray matter nuclei and pons. Also, there is volume loss and confluent abnormal T2 and FLAIR hyperintensity in the body of the corpus callosum (series 11, image 111. Additionally, bilateral periventricular white matter T2 and FLAIR hyperintensity is somewhat nodular and oriented perpendicular to the lateral ventricles (such as on series 11, image 136). No contrast administered. No midline shift, mass effect, evidence of mass lesion, ventriculomegaly, extra-axial collection or acute intracranial hemorrhage. Cervicomedullary junction and pituitary are within normal limits. Vascular: Major intracranial vascular flow voids are preserved, the left vertebral artery appears dominant. Skull and upper cervical spine: Negative visible cervical spine. Visualized bone marrow signal is within normal limits. Sinuses/Orbits: Negative orbits. Mild paranasal sinus mucosal thickening. Other: Mastoids remain clear. Scalp and face soft tissues appear negative. IMPRESSION: 1. Widespread abnormal findings in the brain. 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 - severe T2 hyperintensity and expansion of the pons, medulla, and the medial thalami. 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 - superimposed corpus callosum and cerebral white matter disease which in some ways resembles demyelination. - no associated acute hemorrhage. No significant intracranial mass effect at this time. 2. Although some of the typical features are absent, Severe Posterior Reversible Encephalopathy Syndrome (PRES) should be considered, and  could also explain the scattered acute infarcts. Other differential considerations would include more than one acute disease process, such as Osmotic Demyelination combined with Acute Emboli. Acute infectious encephalitis also is less likely. 3. Recommend Neurology consultation. Electronically Signed   By: Genevie Ann M.D.   On: 06/22/2019 14:10   US Renal  Result Date: 06/23/2019 CLINICAL DATA:  Acute renal failure EXAM: RENAL / URINARY TRACT ULTRASOUND COMPLETE COMPARISON:  None. FINDINGS: Right Kidney: Renal measurements: 10.9 x 4.8 x 4.9 cm = volume: 137 mL. There is increased cortical echogenicity. There is no hydronephrosis. There are few hyperacute 0 ache areas within the upper pole and lower pole measuring up to approximately 2 cm Left Kidney: Renal measurements: 10.2 x 5 x 5.2 cm = volume: 136.8 mL. The echogenicity is increased. There is no hydronephrosis. There is a small hyperechoic area in the lower pole. Bladder: Appears normal for degree of bladder distention. IMPRESSION: 1. No hydronephrosis 2. Echogenic kidneys bilaterally which can be seen in patients with medical renal disease. 3. Wedge-shaped hyperechoic areas involving both kidneys. Differential considerations include bilateral renal infarcts or bilateral pyelonephritis. A renal neoplasm or renal laceration seems less likely. Follow-up is recommended to confirm resolution of the sonographic findings. Electronically Signed   By: Constance Holster M.D.   On: 06/23/2019 03:27   Vas US Renal Artery Duplex  Result Date: 06/27/2019 ABDOMINAL VISCERAL Indications: Hypertension High Risk Factors: Hypertension, hyperlipidemia. Performing Technologist: Abram Sander RVS Supporting Technologist: June Leap RDMS, RVT  Examination Guidelines: A complete evaluation includes B-mode imaging, spectral Doppler, color Doppler, and power Doppler as needed of all accessible portions of each vessel. Bilateral testing is considered an integral part of a complete  examination. Limited examinations for reoccurring indications may be performed as noted.  Duplex Findings: +--------------------+--------+--------+------+--------+ Mesenteric          PSV cm/sEDV cm/sPlaqueComments +--------------------+--------+--------+------+--------+ Aorta at SMA          101      24                  +--------------------+--------+--------+------+--------+ Celiac Artery Origin  146      33                  +--------------------+--------+--------+------+--------+ SMA Proximal          223      25                  +--------------------+--------+--------+------+--------+  +------------------+--------+--------+-------+ Right Renal ArteryPSV cm/sEDV cm/sComment +------------------+--------+--------+-------+ Origin               47      10           +------------------+--------+--------+-------+ Proximal             35      11           +------------------+--------+--------+-------+ Mid                  35      8            +------------------+--------+--------+-------+ Distal  26      8            +------------------+--------+--------+-------+ +-----------------+--------+--------+-------+ Left Renal ArteryPSV cm/sEDV cm/sComment +-----------------+--------+--------+-------+ Origin              43      8            +-----------------+--------+--------+-------+ Proximal            32      8            +-----------------+--------+--------+-------+ Mid                 27      8            +-----------------+--------+--------+-------+ Distal              26      8            +-----------------+--------+--------+-------+ +------------+--------+--------+----+-----------+--------+--------+----+ Right KidneyPSV cm/sEDV cm/sRI  Left KidneyPSV cm/sEDV cm/sRI   +------------+--------+--------+----+-----------+--------+--------+----+ Upper Pole  25      6       0.77Upper Pole 22      6       0.71  +------------+--------+--------+----+-----------+--------+--------+----+ Mid         26      7       0.73Mid        18      6       0.70 +------------+--------+--------+----+-----------+--------+--------+----+ Lower Pole  17      7       0.61Lower Pole 20      6       0.70 +------------+--------+--------+----+-----------+--------+--------+----+ Hilar       27      6       0.79Hilar      22      9       0.60 +------------+--------+--------+----+-----------+--------+--------+----+ +------------------+-----+------------------+----+ Right Kidney           Left Kidney            +------------------+-----+------------------+----+ RAR                    RAR                    +------------------+-----+------------------+----+ RAR (manual)           RAR (manual)           +------------------+-----+------------------+----+ Cortex                 Cortex                 +------------------+-----+------------------+----+ Cortex thickness       Corex thickness        +------------------+-----+------------------+----+ Kidney length (cm)10.81Kidney length (cm)9.94 +------------------+-----+------------------+----+  Summary: Renal:  Right: No evidence of right renal artery stenosis. Abnormal right        Resistive Index. Left:  No evidence of left renal artery stenosis. Normal left        Resistive Index.  *See table(s) above for measurements and observations.  Diagnosing physician: Harold Barban MD  Electronically signed by Harold Barban MD on 06/27/2019 at 3:32:14 PM.    Final      The results of significant diagnostics from this hospitalization (including imaging, microbiology, ancillary and laboratory) are listed below for reference.     Microbiology: No results found for this or any previous visit (from the past 240 hour(s)).   Labs: BNP (last 3 results)  No results for input(s): BNP in the last 8760 hours. Basic Metabolic Panel: Recent Labs  Lab  07/04/19 0859 07/05/19 0524 07/06/19 0552 07/07/19 0638 07/07/19 2039 07/08/19 0246  NA 134* 135 135 134*  --  132*  K 4.0 4.1 4.0 4.1  --  4.0  CL 101 102 101 101  --  100  CO2 21* 22 21* 22  --  21*  GLUCOSE 112* 107* 107* 108*  --  109*  BUN 40* 44* 44* 42*  --  47*  CREATININE 7.16* 7.46* 7.62* 7.75* 8.06* 7.58*  CALCIUM 9.4 9.3 9.2 9.3  --  9.2  PHOS 4.2 4.7* 4.4 4.6  --  4.6   Liver Function Tests: Recent Labs  Lab 07/04/19 0859 07/05/19 0524 07/06/19 0552 07/07/19 0638 07/08/19 0246  ALBUMIN 3.4* 3.2* 3.3* 3.3* 3.4*   No results for input(s): LIPASE, AMYLASE in the last 168 hours. No results for input(s): AMMONIA in the last 168 hours. CBC: Recent Labs  Lab 07/03/19 0738 07/08/19 0246  WBC 9.2 9.2  HGB 10.8* 9.5*  HCT 31.7* 28.4*  MCV 93.2 95.6  PLT 346 385   Cardiac Enzymes: No results for input(s): CKTOTAL, CKMB, CKMBINDEX, TROPONINI in the last 168 hours. BNP: Invalid input(s): POCBNP CBG: No results for input(s): GLUCAP in the last 168 hours. D-Dimer No results for input(s): DDIMER in the last 72 hours. Hgb A1c No results for input(s): HGBA1C in the last 72 hours. Lipid Profile No results for input(s): CHOL, HDL, LDLCALC, TRIG, CHOLHDL, LDLDIRECT in the last 72 hours. Thyroid function studies No results for input(s): TSH, T4TOTAL, T3FREE, THYROIDAB in the last 72 hours.  Invalid input(s): FREET3 Anemia work up No results for input(s): VITAMINB12, FOLATE, FERRITIN, TIBC, IRON, RETICCTPCT in the last 72 hours. Urinalysis    Component Value Date/Time   COLORURINE STRAW (A) 07/03/2019 2320   APPEARANCEUR CLEAR 07/03/2019 2320   LABSPEC 1.004 (L) 07/03/2019 2320   PHURINE 7.0 07/03/2019 2320   GLUCOSEU NEGATIVE 07/03/2019 2320   HGBUR NEGATIVE 07/03/2019 2320   BILIRUBINUR NEGATIVE 07/03/2019 2320   KETONESUR NEGATIVE 07/03/2019 2320   PROTEINUR 100 (A) 07/03/2019 2320   NITRITE NEGATIVE 07/03/2019 2320   LEUKOCYTESUR NEGATIVE 07/03/2019  2320   Sepsis Labs Invalid input(s): PROCALCITONIN,  WBC,  LACTICIDVEN Microbiology No results found for this or any previous visit (from the past 240 hour(s)).   Time coordinating discharge in minutes: 65  SIGNED:   Debbe Odea, MD  [facility] 07/08/2019, 10:33 AM Pager   If 7PM-7AM, please contact night-coverage [email or web address] Password [email or web address]

## 2019-07-08 NOTE — Progress Notes (Signed)
William [name] to be D/C'd  per MD order. Discussed with the patient and all questions fully answered.  VSS, Skin clean, dry and intact without evidence of skin break down, no evidence of skin tears noted.  IV catheter discontinued intact. Site without signs and symptoms of complications. Dressing and pressure applied.  An After Visit Summary was printed and given to the patient. Patient received prescription.  D/c education completed with patient/family including follow up instructions, medication list, d/c activities limitations if indicated, with other d/c instructions as indicated by MD - patient able to verbalize understanding, all questions fully answered.   Patient instructed to return to ED, call 911, or call MD for any changes in condition.   Patient to be escorted via [place], and D/C home via private auto.

## 2019-07-08 NOTE — Discharge Instructions (Signed)
You will need to see you primary care practitioner in 3-4 wks and have the following blood work done: CBC.  Avoid cigarettes, drugs and alcohol. Continue to use the nicotine patches until they are finished.   You were cared for by a hospitalist during your hospital stay. If you have any questions about your discharge medications or the care you received while you were in the hospital after you are discharged, you can call the unit and asked to speak with the hospitalist on call if the hospitalist that took care of you is not available. Once you are discharged, your primary care physician will handle any further medical issues.   Please note that NO REFILLS for any discharge medications will be authorized once you are discharged, as it is imperative that you return to your primary care physician (or establish a relationship with a primary care physician if you do not have one) for your aftercare needs so that they can reassess your need for medications and monitor your lab values.  Please take all your medications with you for your next visit with your Primary MD. Please ask your Primary MD to get all Hospital records sent to his/her office. Please request your Primary MD to go over all hospital test results at the follow up.   If you experience worsening of your admission symptoms, develop shortness of breath, chest pain, suicidal or homicidal thoughts or a life threatening emergency, you must seek medical attention immediately by calling 911 or calling your MD.   Dennis Bast must read the complete instructions/literature along with all the possible adverse reactions/side effects for all the medicines you take including new medications that have been prescribed to you. Take new medicines after you have completely understood and accpet all the possible adverse reactions/side effects.    Do not drive when taking pain medications or sedatives.     Do not take more than prescribed Pain, Sleep and Anxiety  Medications   If you have smoked or chewed Tobacco in the last 2 yrs please stop. Stop any regular alcohol  and or recreational drug use.   Wear Seat belts while driving.

## 2019-07-08 NOTE — Evaluation (Signed)
Physical Therapy Evaluation and D/C  Patient Details Name: Orren Pietsch MRN: [id number] DOB: [birth year] Today's Date: 07/08/2019   History of Present Illness  33 year old male with a history of hypertension who initially presented with chest tightness, shortness of breath, and intermittent headaches.  Wife also noted decreased energy, sluggish appearing, and constipation. BP in ED 254/167. Admitted to ICU 06/22/19.  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 Pt with elevated troponin assessed by cardiology to be likely due to demand ischemia. Pt transferred to Rehab 7/16- 7/24 and transferred to Acute for kidney issues.    Clinical Impression  Pt admitted with above diagnosis. Pt currently without significant functional limitations in controlled environment. Pt was able to ambulate on unit without assist and functioning at independent level.  Pt without balance issues however noted on Rehab that pt does fatigue and that he has balance issues with  Higher level balance activity. Recommend Outpatient PT for return to prior functional level and they can address pt's needs. Will sign off on Acute.       Follow Up Recommendations Outpatient PT;Supervision - Intermittent    Equipment Recommendations  None recommended by PT    Recommendations for Other Services       Precautions / Restrictions Precautions Precautions: None Restrictions Weight Bearing Restrictions: No      Mobility  Bed Mobility Overal bed mobility: Independent                Transfers Overall transfer level: Independent                  Ambulation/Gait Ambulation/Gait assistance: Independent Gait Distance (Feet): 510 Feet Assistive device: None Gait Pattern/deviations: WFL(Within Functional Limits)   Gait velocity interpretation: 1.31 - 2.62 ft/sec,  indicative of limited community ambulator General Gait Details: can accept challenges to balance in controlled environment.  Pt may have more difficulty as he fatigues as well as with very difficult balance activities.   Stairs            Wheelchair Mobility    Modified Rankin (Stroke Patients Only)       Balance Overall balance assessment: Needs assistance Sitting-balance support: No upper extremity supported;Feet supported Sitting balance-Leahy Scale: Good     Standing balance support: No upper extremity supported;During functional activity Standing balance-Leahy Scale: Good                   Standardized Balance Assessment Standardized Balance Assessment : Dynamic Gait Index   Dynamic Gait Index Level Surface: Normal Change in Gait Speed: Normal Gait with Horizontal Head Turns: Normal Gait with Vertical Head Turns: Normal Gait and Pivot Turn: Normal Step Over Obstacle: Mild Impairment Step Around Obstacles: Mild Impairment Steps: Mild Impairment Total Score: 21       Pertinent Vitals/Pain Pain Assessment: No/denies pain    Home Living Family/patient expects to be discharged to:: Private residence Living Arrangements: Spouse/significant other;Children Available Help at Discharge: Family;Available 24 hours/day Type of Home: House Home Access: Stairs to enter(chart states 2 STE without rails, but pt reports 3 steps with L rail/doorframe) Entrance Stairs-Rails: None Entrance Stairs-Number of Steps: 2 Home Layout: One level        Prior Function Level of Independence: Independent         Comments: producing music, has 2 pitbulls     Hand Dominance   Dominant Hand: Left  Extremity/Trunk Assessment   Upper Extremity Assessment Upper Extremity Assessment: Defer to OT evaluation    Lower Extremity Assessment Lower Extremity Assessment: Overall WFL for tasks assessed       Communication   Communication: No difficulties  Cognition  Arousal/Alertness: Awake/alert Behavior During Therapy: WFL for tasks assessed/performed                       Current Attention Level: Selective Memory: Decreased short-term memory Following Commands: Follows one step commands consistently;Follows multi-step commands consistently   Awareness: Intellectual          General Comments      Exercises     Assessment/Plan    PT Assessment All further PT needs can be met in the next venue of care  PT Problem List Decreased balance;Decreased mobility       PT Treatment Interventions      PT Goals (Current goals can be found in the Care Plan section)  Acute Rehab PT Goals Patient Stated Goal: to get home PT Goal Formulation: All assessment and education complete, DC therapy    Frequency     Barriers to discharge        Co-evaluation               AM-PAC PT "6 Clicks" Mobility  Outcome Measure Help needed turning from your back to your side while in a flat bed without using bedrails?: None Help needed moving from lying on your back to sitting on the side of a flat bed without using bedrails?: None Help needed moving to and from a bed to a chair (including a wheelchair)?: None Help needed standing up from a chair using your arms (e.g., wheelchair or bedside chair)?: None Help needed to walk in hospital room?: None Help needed climbing 3-5 steps with a railing? : None 6 Click Score: 24    End of Session Equipment Utilized During Treatment: Gait belt Activity Tolerance: Patient tolerated treatment well Patient left: in bed;with call bell/phone within reach Nurse Communication: Mobility status PT Visit Diagnosis: Muscle weakness (generalized) (M62.81)    Time: 0272-5366 PT Time Calculation (min) (ACUTE ONLY): 9 min   Charges:   PT Evaluation $PT Eval Low Complexity: 1 Low          Rayne Loiseau,PT Acute Rehabilitation Services Pager:  [phone]  Office:  [place] 07/08/2019, 10:47 AM

## 2019-07-09 ENCOUNTER — Emergency Department (HOSPITAL_COMMUNITY)
Admission: EM | Admit: 2019-07-09 | Discharge: 2019-07-10 | Disposition: A | Discharge status: Home / Self Care | Payer: Medicaid Other | Attending: Emergency Medicine | Admitting: Emergency Medicine

## 2019-07-09 ENCOUNTER — Other Ambulatory Visit: Payer: Self-pay

## 2019-07-09 DIAGNOSIS — F17210 Nicotine dependence, cigarettes, uncomplicated: Secondary | ICD-10-CM | POA: Diagnosis not present

## 2019-07-09 DIAGNOSIS — Z8673 Personal history of transient ischemic attack (TIA), and cerebral infarction without residual deficits: Secondary | ICD-10-CM | POA: Insufficient documentation

## 2019-07-09 DIAGNOSIS — Z79899 Other long term (current) drug therapy: Secondary | ICD-10-CM | POA: Insufficient documentation

## 2019-07-09 DIAGNOSIS — Z7982 Long term (current) use of aspirin: Secondary | ICD-10-CM | POA: Diagnosis not present

## 2019-07-09 DIAGNOSIS — I159 Secondary hypertension, unspecified: Secondary | ICD-10-CM

## 2019-07-09 DIAGNOSIS — Z7902 Long term (current) use of antithrombotics/antiplatelets: Secondary | ICD-10-CM | POA: Insufficient documentation

## 2019-07-09 DIAGNOSIS — I10 Essential (primary) hypertension: Secondary | ICD-10-CM | POA: Diagnosis present

## 2019-07-10 ENCOUNTER — Encounter (HOSPITAL_COMMUNITY): Payer: Self-pay | Admitting: Emergency Medicine

## 2019-07-10 ENCOUNTER — Other Ambulatory Visit: Payer: Self-pay

## 2019-07-10 LAB — CBC WITH DIFFERENTIAL/PLATELET
Abs Immature Granulocytes: 0.03 10*3/uL (ref 0.00–0.07)
Basophils Absolute: 0.1 10*3/uL (ref 0.0–0.1)
Basophils Relative: 1 %
Eosinophils Absolute: 0.5 10*3/uL (ref 0.0–0.5)
Eosinophils Relative: 5 %
HCT: 31.1 % — ABNORMAL LOW (ref 39.0–52.0)
Hemoglobin: 10.3 g/dL — ABNORMAL LOW (ref 13.0–17.0)
Immature Granulocytes: 0 %
Lymphocytes Relative: 20 %
Lymphs Abs: 2 10*3/uL (ref 0.7–4.0)
MCH: 31.8 pg (ref 26.0–34.0)
MCHC: 33.1 g/dL (ref 30.0–36.0)
MCV: 96 fL (ref 80.0–100.0)
Monocytes Absolute: 0.8 10*3/uL (ref 0.1–1.0)
Monocytes Relative: 8 %
Neutro Abs: 6.7 10*3/uL (ref 1.7–7.7)
Neutrophils Relative %: 66 %
Platelets: 506 10*3/uL — ABNORMAL HIGH (ref 150–400)
RBC: 3.24 MIL/uL — ABNORMAL LOW (ref 4.22–5.81)
RDW: 12.5 % (ref 11.5–15.5)
WBC: 10.1 10*3/uL (ref 4.0–10.5)
nRBC: 0 % (ref 0.0–0.2)

## 2019-07-10 LAB — COMPREHENSIVE METABOLIC PANEL
ALT: 19 U/L (ref 0–44)
AST: 16 U/L (ref 15–41)
Albumin: 3.9 g/dL (ref 3.5–5.0)
Alkaline Phosphatase: 78 U/L (ref 38–126)
Anion gap: 14 (ref 5–15)
BUN: 50 mg/dL — ABNORMAL HIGH (ref 6–20)
CO2: 20 mmol/L — ABNORMAL LOW (ref 22–32)
Calcium: 9.6 mg/dL (ref 8.9–10.3)
Chloride: 98 mmol/L (ref 98–111)
Creatinine, Ser: 7.36 mg/dL — ABNORMAL HIGH (ref 0.61–1.24)
GFR calc Af Amer: 10 mL/min — ABNORMAL LOW (ref >60)
GFR calc non Af Amer: 9 mL/min — ABNORMAL LOW (ref >60)
Glucose, Bld: 107 mg/dL — ABNORMAL HIGH (ref 70–99)
Potassium: 4.2 mmol/L (ref 3.5–5.1)
Sodium: 132 mmol/L — ABNORMAL LOW (ref 135–145)
Total Bilirubin: 0.7 mg/dL (ref 0.3–1.2)
Total Protein: 7.5 g/dL (ref 6.5–8.1)

## 2019-07-10 NOTE — ED Notes (Signed)
Patient verbalizes understanding of discharge instructions. Opportunity for questioning and answers were provided. Armband removed by staff, pt discharged from ED in wheelchair.  

## 2019-07-10 NOTE — ED Provider Notes (Signed)
[facility] EMERGENCY DEPARTMENT Provider Note  CSN: [id number] Arrival date & time: 07/09/19 2335  Chief Complaint(s) Hypertension  HPI William [name] is a 33 y.o. male with extensive past medical history listed below including recent admission for hypertensive emergency with press who presents to the emergency department for elevated blood pressures.  Patient reports that he got a pressure cuff 2 days ago.  Noted that his blood pressure has been trending up.  States that he has been compliant with his medications as he supposed to take them.  Denied any associated chest pain or shortness of breath.  No lower extremity swelling or dyspnea on exertion.  Patient still making urine.  He denies any focal deficits.  No visual disturbance.  Denies recent substance use. Has been on low sodium diet. Meds also managed by wife.  HPI  Past Medical History Past Medical History:  Diagnosis Date  . Drug abuse, cocaine type ([place]) 06/23/2019  . Hyperlipidemia   . Hypertension    Patient Active Problem List   Diagnosis Date Noted  . Anemia of chronic disease 07/08/2019  . AKI (acute kidney injury) ([place]) 07/08/2019  . Stroke ([place]) 07/07/2019  . HLD (hyperlipidemia) 07/07/2019  . Polysubstance abuse ([place]) 07/07/2019  . Tobacco abuse 07/07/2019  . Uncontrolled hypertension   . Acute kidney injury ([place])   . PRES (posterior reversible encephalopathy syndrome) 06/29/2019  . 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 Hypertensive emergency 06/22/2019  . Acute renal failure ([place])    Home Medication(s) Prior to Admission medications   Medication Sig Start Date End Date Taking? Authorizing Provider  amLODipine (NORVASC) 10 MG tablet Take 1 tablet (10 mg total) by mouth at bedtime. 07/08/19  Yes Debbe Odea, MD  aspirin 81 MG chewable tablet Chew 1 tablet (81 mg total) by mouth daily. 06/30/19  Yes Shelly Coss, MD  atorvastatin (LIPITOR) 40 MG tablet Take 1  tablet (40 mg total) by mouth daily at 6 PM. 06/29/19  Yes Adhikari, Tamsen Meek, MD  carvedilol (COREG) 25 MG tablet Take 1 tablet (25 mg total) by mouth 2 (two) times daily with a meal. 06/29/19  Yes Adhikari, Tamsen Meek, MD  clopidogrel (PLAVIX) 75 MG tablet Take 1 tablet (75 mg total) by mouth daily. 07/09/19  Yes Debbe Odea, MD  hydrALAZINE (APRESOLINE) 100 MG tablet Take 1 tablet (100 mg total) by mouth every 8 (eight) hours. 07/08/19  Yes Debbe Odea, MD  nicotine (NICODERM CQ - DOSED IN MG/24 HR) 7 mg/24hr patch Place 1 patch (7 mg total) onto the skin daily. 07/08/19  Yes Debbe Odea, MD  spironolactone (ALDACTONE) 25 MG tablet Take 1 tablet (25 mg total) by mouth daily. 07/09/19  Yes Debbe Odea, MD                                                                                                                                    Past Surgical History  Past Surgical History:  Procedure Laterality Date  . RADIOLOGY WITH ANESTHESIA N/A 06/23/2019   Procedure: MRI OF VESSELS;  Surgeon: Radiologist, Medication, MD;  Location: [place];  Service: Radiology;  Laterality: N/A;   Family History Family History  Problem Relation Age of Onset  . Diabetes Sister   . Diabetes Maternal Uncle     Social History Social History   Tobacco Use  . Smoking status: Current Every Day Smoker    Packs/day: 1.00  . Smokeless tobacco: Never Used  Substance Use Topics  . Alcohol use: Yes    Comment: 2-3 beers a day  . Drug use: Yes    Types: Marijuana, Cocaine, Benzodiazepines   Allergies Reglan [metoclopramide]  Review of Systems Review of Systems All other systems are reviewed and are negative for acute change except as noted in the HPI  Physical Exam Vital Signs  I have reviewed the triage vital signs BP (!) 177/104   Pulse 80   Temp 98.4 F (36.9 C) (Oral)   Resp (!) 23   SpO2 99%   Physical Exam Vitals signs reviewed.  Constitutional:      General: He is not in acute distress.     Appearance: He is well-developed. He is not diaphoretic.  HENT:     Head: Normocephalic and atraumatic.     Nose: Nose normal.  Eyes:     General: No scleral icterus.       Right eye: No discharge.        Left eye: No discharge.     Conjunctiva/sclera: Conjunctivae normal.     Pupils: Pupils are equal, round, and reactive to light.  Neck:     Musculoskeletal: Normal range of motion and neck supple.  Cardiovascular:     Rate and Rhythm: Normal rate and regular rhythm.     Heart sounds: No murmur. No friction rub. No gallop.   Pulmonary:     Effort: Pulmonary effort is normal. No respiratory distress.     Breath sounds: Normal breath sounds. No stridor. No rales.  Abdominal:     General: There is no distension.     Palpations: Abdomen is soft.     Tenderness: There is no abdominal tenderness.  Musculoskeletal:        General: No tenderness.  Skin:    General: Skin is warm and dry.     Findings: No erythema or rash.  Neurological:     Mental Status: He is alert and oriented to person, place, and time.     ED Results and Treatments Labs (all labs ordered are listed, but only abnormal results are displayed) Labs Reviewed  CBC WITH DIFFERENTIAL/PLATELET - Abnormal; Notable for the following components:      Result Value   RBC 3.24 (*)    Hemoglobin 10.3 (*)    HCT 31.1 (*)    Platelets 506 (*)    All other components within normal limits  COMPREHENSIVE METABOLIC PANEL - Abnormal; Notable for the following components:   Sodium 132 (*)    CO2 20 (*)    Glucose, Bld 107 (*)    BUN 50 (*)    Creatinine, Ser 7.36 (*)    GFR calc non Af Amer 9 (*)    GFR calc Af Amer 10 (*)    All other components within normal limits  EKG  EKG Interpretation  Date/Time:    Ventricular Rate:    PR Interval:    QRS Duration:   QT Interval:    QTC Calculation:   R  Axis:     Text Interpretation:        Radiology No results found.  Pertinent labs & imaging results that were available during my care of the patient were reviewed by me and considered in my medical decision making (see chart for details).  Medications Ordered in ED Medications - No data to display                                                                                                                                  Procedures Procedures  (including critical care time)  Medical Decision Making / ED Course I have reviewed the nursing notes for this encounter and the patient's prior records (if available in EHR or on provided paperwork).   Imad Shostak was evaluated in Emergency Department on 07/10/2019 for the symptoms described in the history of present illness. He was evaluated in the context of the global COVID-19 pandemic, which necessitated consideration that the patient might be at risk for infection with the SARS-CoV-2 virus that causes COVID-19. Institutional protocols and algorithms that pertain to the evaluation of patients at risk for COVID-19 are in a state of rapid change based on information released by regulatory bodies including the CDC and federal and state organizations. These policies and algorithms were followed during the patient's care in the ED.  Asymptomatic hypertension.  Screening labs notable for improving renal function over the last 3 days.  Patient is unsure of who we asked to follow-up with.  Informed that he needs to follow-up with his cardiologist and renal doctors.  Will provide the patient with contact information for both.  The patient is safe for discharge with strict return precautions.       Final Clinical Impression(s) / ED Diagnoses Final diagnoses:  Secondary hypertension    The patient appears reasonably screened and/or stabilized for discharge and I doubt any other medical condition or other [facility] requiring further screening,  evaluation, or treatment in the ED at this time prior to discharge.  Disposition: Discharge  Condition: Good  I have discussed the results, Dx and Tx plan with the patient who expressed understanding and agree(s) with the plan. Discharge instructions discussed at great length. The patient was given strict return precautions who verbalized understanding of the instructions. No further questions at time of discharge.    ED Discharge Orders    None       Follow Up: [facility], [address] [place] [address] [phone]  Schedule an appointment as soon as possible for a visit    [facility], [place] [address] [phone]  Schedule an appointment as soon as possible for a visit        This  chart was dictated using voice recognition software.  Despite best efforts to proofread,  errors can occur which can change the documentation meaning.   Fatima Blank, MD 07/10/19 [phone]

## 2019-07-10 NOTE — ED Triage Notes (Signed)
Patient with hypertension, on hypertension meds and states that he has been taking them the way he should be.  Patient states he had a stroke two weeks ago.

## 2019-07-20 ENCOUNTER — Telehealth (HOSPITAL_COMMUNITY): Payer: Self-pay

## 2019-07-31 ENCOUNTER — Other Ambulatory Visit: Payer: Self-pay

## 2019-07-31 ENCOUNTER — Ambulatory Visit: Payer: Medicaid Other | Attending: Physical Medicine & Rehabilitation | Admitting: Physical Therapy

## 2019-07-31 ENCOUNTER — Encounter: Payer: Self-pay | Admitting: Physical Therapy

## 2019-07-31 ENCOUNTER — Ambulatory Visit: Payer: Medicaid Other | Admitting: Speech Pathology

## 2019-07-31 ENCOUNTER — Encounter: Payer: Self-pay | Admitting: Speech Pathology

## 2019-07-31 VITALS — BP 165/90 | HR 86

## 2019-07-31 DIAGNOSIS — R262 Difficulty in walking, not elsewhere classified: Secondary | ICD-10-CM | POA: Diagnosis present

## 2019-07-31 DIAGNOSIS — I69318 Other symptoms and signs involving cognitive functions following cerebral infarction: Secondary | ICD-10-CM | POA: Insufficient documentation

## 2019-07-31 DIAGNOSIS — Z9181 History of falling: Secondary | ICD-10-CM | POA: Insufficient documentation

## 2019-07-31 DIAGNOSIS — R41841 Cognitive communication deficit: Secondary | ICD-10-CM

## 2019-07-31 DIAGNOSIS — M6281 Muscle weakness (generalized): Secondary | ICD-10-CM | POA: Insufficient documentation

## 2019-07-31 DIAGNOSIS — R471 Dysarthria and anarthria: Secondary | ICD-10-CM | POA: Insufficient documentation

## 2019-07-31 DIAGNOSIS — R2681 Unsteadiness on feet: Secondary | ICD-10-CM | POA: Diagnosis not present

## 2019-07-31 NOTE — Therapy (Signed)
[facility] [address] [place], [place], [address] Phone: [phone]   Fax:  [phone]  Speech Language Pathology Evaluation  Patient Details  Name: William [name] MRN: [id number] Date of Birth: 04-Feb-1986 Referring Provider (SLP): Dr. Alger Simons   Encounter Date: 07/31/2019  End of Session - 07/31/19 1617    Visit Number  1    Number of Visits  17    Date for SLP Re-Evaluation  10/09/19    Authorization Type  medicaid - PT request 6 visits; OT eval pending - will request 9 visits    SLP Start Time  1400    SLP Stop Time   1448    SLP Time Calculation (min)  48 min    Activity Tolerance  Patient tolerated treatment well       Past Medical History:  Diagnosis Date  . Drug abuse, cocaine type ([facility]) 06/23/2019  . Hyperlipidemia   . Hypertension     Past Surgical History:  Procedure Laterality Date  . RADIOLOGY WITH ANESTHESIA N/A 06/23/2019   Procedure: MRI OF VESSELS;  Surgeon: Radiologist, Medication, MD;  Location: [facility];  Service: Radiology;  Laterality: N/A;    There were no vitals filed for this visit.  Subjective Assessment - 07/31/19 1556    Subjective  "If he gets interupted, he just forgets what he was doing"    Patient is accompained by:  Family member   wife, Jone Baseman   Currently in Pain?  No/denies         SLP Evaluation [facility] - 07/31/19 1556      SLP Visit Information   SLP Received On  07/31/19    Referring Provider (SLP)  Dr. Alger Simons    Onset Date  06/22/19    Medical Diagnosis  multiple infarctions      Subjective   Patient/Family Stated Goal  To be able to think right      General Information   HPI   William [name] is a 33 year old LH-male with history of HTN, polysubstance abuse who was admitted via ED on 06/22/19 shortness of breath, headaches and malignant hypertension.  He reported having been out of BP meds x 6 months.  He was started on Cardene drip and UDS  positive for benzos, THC and cocaine.  He was noted to have acute renal failure MRI brain showed numerous scattered acute infarcts in both cerebral hemispheres and brainstem's, severe T2 hyperintensity and expansion of pons, medullary and medial thalami with superimposed corpus callosum and cerebral white matter disease--radiology question changes due to PRES. He received ST in the hospital for dysarthria and cognition. Prior to this CVA, pt was independent with all high level ADL's. He managed his families finances and prior to Covid was working full time.     Mobility Status  walks independently      Balance Screen   Has the patient fallen in the past 6 months  No    Has the patient had a decrease in activity level because of a fear of falling?   No    Is the patient reluctant to leave their home because of a fear of falling?   No      Prior Functional Status   Cognitive/Linguistic Baseline  Within functional limits    Type of Home  House     Lives With  Spouse;Son;Daughter      Cognition   Overall Cognitive Status  Impaired/Different from baseline  Area of Impairment  Orientation;Attention;Memory;Awareness;Problem solving    Attention  Selective    Sustained Attention  Impaired    Sustained Attention Impairment  Verbal basic;Functional basic    Memory  Impaired    Memory Impairment  Decreased recall of new information;Decreased short term memory   affected by attention   Decreased Short Term Memory  Verbal basic;Functional basic    Awareness  Impaired    Awareness Impairment  Emergent impairment    Problem Solving  Impaired    Problem Solving Impairment  Verbal basic;Functional basic    Oceanographer;Sequencing;Organizing;Decision Making;Self Monitoring;Self Correcting    Behaviors  Impulsive      Auditory Comprehension   Overall Auditory Comprehension  Appears within functional limits for tasks assessed      Visual Recognition/Discrimination   Discrimination   Not tested      Reading Comprehension   Reading Status  Within funtional limits      Expression   Primary Mode of Expression  Verbal      Verbal Expression   Overall Verbal Expression  Appears within functional limits for tasks assessed      Written Expression   Dominant Hand  Left    Written Expression  Within Functional Limits      Oral Motor/Sensory Function   Overall Oral Motor/Sensory Function  Appears within functional limits for tasks assessed      Motor Speech   Overall Motor Speech  Impaired    Respiration  Within functional limits    Level of Impairment  Conversation    Phonation  Normal    Resonance  Within functional limits    Articulation  Impaired    Level of Impairment  Conversation    Intelligibility  Intelligibility reduced    Word  75-100% accurate    Phrase  75-100% accurate    Sentence  75-100% accurate    Conversation  75-100% accurate    Motor Planning  Witnin functional limits    Motor Speech Errors  Aware    Effective Techniques  Slow rate;Over-articulate    Phonation  WFL      Standardized Assessments   Standardized Assessments   Cognitive Linguistic Quick Test      Cognitive Linguistic Quick Test (Ages 18-69)   Attention  Mild    Memory  Mild    Executive Function  Moderate    Language  Mild    Visuospatial Skills  Mild    Severity Rating Total  14    Composite Severity Rating  11.6                      SLP Education - 07/31/19 1616    Education Details  compensations for attention,memory and sensory overstimulation    Person(s) Educated  Patient;Spouse    Methods  Explanation;Verbal cues;Handout    Comprehension  Verbalized understanding;Verbal cues required;Need further instruction       SLP Short Term Goals - 07/31/19 1633      SLP SHORT TERM GOAL #1   Title  Pt will utilize external aids to manage 2 medication administrations per day with rare min A from spouse over 3 sessions    Baseline  Spouse is managing  meds for pt    Time  4    Period  Weeks    Status  New      SLP SHORT TERM GOAL #2   Title  Pt will utilize external aids/memory book to manage  his schedule and recall daily pertinent events with rare min A over 2 sessions    Baseline  Spouse is manageing pt's schedule and pt is not recalling daily events or conversations    Time  4    Period  Weeks    Status  New      SLP SHORT TERM GOAL #3   Title  Pt will utilize compensations for dysarthria with rare min A over 2 sessions in 10 minute moderately complex conversation    Baseline  Pt is not using compensations for dysarthria    Time  4    Period  Weeks    Status  New       SLP Long Term Goals - 07/31/19 1636      SLP LONG TERM GOAL #1   Title  Pt will utilize external aids to remain focused and complete 2 household chores in mildly distracting environments with rare min A.    Baseline  Pt is not completing chores when he gets distracted    Time  8    Period  Weeks    Status  New      SLP LONG TERM GOAL #2   Title  Pt will utilize external aids/compensations to mangage his medication 3x a day with rare min A over 3 sessions    Baseline  Spouse is manageing pt's meds    Time  8    Period  Weeks    Status  New      SLP LONG TERM GOAL #3   Title  Pt will utulize compensations/external aids to manage simple finances/bills with occasional min A from spouse over 3 sessions    Baseline  spouse is managing finances which was pt's responsibility prior to CVA    Time  8    Period  Weeks    Status  New       Plan - 07/31/19 1619    Clinical Impression Statement  Mr. Woolridge is referred for outpt ST s/p multiple infarction CVA (I63.9) resulting in moderate cognitive communication impairment (R41.841)and mild dysarthria (R47.1). The Cognitive Linguistic Quick Test (CLQT) revealed Mild attention, memory, language and visuospatial impairments and moderate executive function impairments. Intellectual awareness in intact, however  emergent awareness is not. At this time, Mrs. Mackowiak reports she is managing her husbands medications. She is reminding him of his appointments and reports he forgets topics that they have discussed and forgets important events, such a s a visitor or conversations with his famiy. She also stated that Korin will ask her the same question several times, not recalling he had already asked. At this time, Jaylee is not using compensations for memory or attention. He is not using external aids or reminders. When Everett is given a chore at home, if he is distracted, he will not complete the chore and forgets what he was supposed to do. Prior to CVA, Stryker managed the family finances. His wife is doing this now. [place] pt and spouse report slurrred speech, especailly with fatigue. Pt was intelligible in conversation today. When asked if he was using his compensatory strategie, he replied "no.". As pt was independent prior to CVA, I  recommend skilled ST to maximixe cogniton for safety, QOL, reduce spouse burden and to maximize intelligiblity.    Speech Therapy Frequency  2x / week    Duration  --   8 weeks or 17 visits   Treatment/Interventions  Compensatory strategies;Patient/family education;Functional tasks;Cueing hierarchy;Environmental controls;Cognitive reorganization;SLP instruction and feedback;Internal/external  aids;Compensatory techniques;Language facilitation    Potential Considerations  Financial resources    Consulted and Agree with Plan of Care  Patient;Family member/caregiver    Family Member Consulted  spouse       Patient will benefit from skilled therapeutic intervention in order to improve the following deficits and impairments:   1. Dysarthria and anarthria   2. Cognitive communication deficit       Problem List Patient Active Problem List   Diagnosis Date Noted  . Anemia of chronic disease 07/08/2019  . AKI (acute kidney injury) ([facility]) 07/08/2019  . Stroke ([facility]) 07/07/2019   . HLD (hyperlipidemia) 07/07/2019  . Polysubstance abuse ([facility]) 07/07/2019  . Tobacco abuse 07/07/2019  . Uncontrolled hypertension   . Acute kidney injury ([facility])   . PRES (posterior reversible encephalopathy syndrome) 06/29/2019  . 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 Hypertensive emergency 06/22/2019  . Acute renal failure ([facility])     Lovvorn, Annye Rusk MS, CCC-SLP 07/31/2019, 4:41 PM  [facility] [address] [place], [place], [address] Phone: [phone]   Fax:  [phone]  Name: Damu Madani MRN: [id number] Date of Birth: 10/03/1986

## 2019-07-31 NOTE — Patient Instructions (Signed)
  Set a timer for mid day meds and don't turn it off until you take the pills  Look at the calendar every morning and review your day in the evening   Memory notebook to journal important events of the day to aid with recall  Use a ball cap, visor, and sunglasses to help your brain not be affected by lights  Because you look so good on the outside, friends and family may not realize that your brain is still healing and that you aren't back to 100% yet. They may need some education about your attention/focus and memory aren't back.    Tips to help facilitate better attention, concentration, focus   Do harder, longer tasks when you are most alert/awake  Break down larger tasks into small parts  Limit distractions of TV, radio, conversation, e mails/texts, appliance noise, etc - if a job is important, do it in a quiet room  Be aware of how you are functioning in high stimulation environments such as large stores, parties, restaurants - any place with lots of lights, noise, signs etc  Group conversations may be more difficult to process than one on one conversations  Give yourself extra time to process conversation, reading materials, directions or information from your healthcare providers  Organization is key - clutters of laundry, mail, paperwork, dirty dishes - all make it more difficult to concentrate  Before you start a task, have all the needed supplies, directions, recipes ready and organized. This way you don't have to go looking for something in the middle of a task and become distracted.   Be aware of fatigue - take rests or breaks when needed to re-group and re-focus

## 2019-08-01 NOTE — Therapy (Addendum)
[facility] [address] [place], [place], [address] Phone: [phone]   Fax:  [phone]  Physical Therapy Evaluation  Patient Details  Name: Daymien Cutchin MRN: [id number] Date of Birth: [birth year] No data recorded  Encounter Date: 07/31/2019  PT End of Session - 07/31/19 1414    Visit Number  1    Number of Visits  7   eval plus 6 visits   Date for PT Re-Evaluation  09/19/19    Authorization Type  Medicaid    Authorization Time Period  OT eval pending, ST requesting 9 visits    PT Start Time  1315    PT Stop Time  1400    PT Time Calculation (min)  45 min    Equipment Utilized During Treatment  Gait belt    Activity Tolerance  Patient tolerated treatment well    Behavior During Therapy  WFL for tasks assessed/performed       Past Medical History:  Diagnosis Date  . Drug abuse, cocaine type ([facility]) 06/23/2019  . Hyperlipidemia   . Hypertension     Past Surgical History:  Procedure Laterality Date  . RADIOLOGY WITH ANESTHESIA N/A 06/23/2019   Procedure: MRI OF VESSELS;  Surgeon: Radiologist, Medication, MD;  Location: [facility];  Service: Radiology;  Laterality: N/A;    Vitals:   07/31/19 1322  BP: (!) 165/90  Pulse: 86     Subjective Assessment - 07/31/19 1325    Subjective  States that now his balance he will feel a misstep every now and then. Will occassionally needs some help for sit to stands. Gets tired after dual tasking (walking while talking on the phone). And states that sometimes he takes a little longer than normal to walk. Now reports feeling dizzy (new after hospitalization); for example if after he is reading a book for too long.    Patient is accompained by:  Family member   Shepherd   Pertinent History  hospitalized on 06/21/19 for hypertensive urgency, Acute CVAs in b/l anterior and posterior circulation - hypertension, durg abuse, HLD, acute kidney failure    Diagnostic tests  MRI showed  numerous scattered acute infarcts in the cerebral hemispheres and brainstem, severe hyperintensity and expansion of the pons, medulla, and medial thalami, chronic small vessel disease    Patient Stated Goals  wants to walk more fluently    Currently in Pain?  Yes    Pain Score  4     Pain Location  Back    Pain Orientation  Left   low back        [facility] PT Assessment - 07/31/19 1330      Assessment   Medical Diagnosis  multiple infarctions    Referring Provider (PT)  Alger Simons, MD    Onset Date/Surgical Date  06/21/19    Hand Dominance  Left    Prior Therapy  CIR in hospital - ST OT and PT      Precautions   Precautions  Fall      Balance Screen   Has the patient fallen in the past 6 months  Yes   prior to hospitalization and during hospitalization   How many times?  --   occassional    Has the patient had a decrease in activity level because of a fear of falling?   No    Is the patient reluctant to leave their home because of a fear of falling?   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; making gas pumps     Leisure  likes music, landscaping, tossing a football, has 2 Charity fundraiser   Gross Motor Movements are Fluid and Coordinated  No    Coordination and Movement Description  mild impairment RLE    Finger Nose Finger Test  BUE WFL      ROM / Strength   AROM / PROM / Strength  Strength       Strength   Strength Assessment Site  Hip;Knee;Ankle    Right/Left Hip  Right;Left    Right Hip Flexion  5/5    Left Hip Flexion  4/5    Right/Left Knee  Right;Left    Right Knee Flexion  5/5    Right Knee Extension  5/5    Left Knee Flexion  4+/5    Left Knee Extension  5/5    Right/Left Ankle  Right;Left    Right Ankle Dorsiflexion  4+/5    Right Ankle Plantar Flexion  4+/5      Transfers   Five time sit to stand comments   15 seconds    from brown arm chair, no UE support     Ambulation/Gait   Ambulation/Gait  Yes    Ambulation/Gait Assistance  5: Supervision    Ambulation Distance (Feet)  115 Feet   clinic distances for eval   Assistive device  None    Gait Pattern  Step-through pattern;Poor foot clearance - left;Decreased stride length    Ambulation Surface  Level;Indoor    Gait velocity  9.94 seconds = 3.23 ft/sec    Stairs  Yes    Stairs Assistance  5: Supervision    Stairs Assistance Details (indicate cue type and reason)  No rail used for ascending using a step through pattern. While descending uses a step through pattern, however needs to use railing and is more cautious     Stair Management Technique  One rail Left;Alternating pattern;No rails;Forwards    Number of Stairs  4    Height of Stairs  6      Functional Gait  Assessment   Gait assessed   Yes    Gait Level Surface  Walks 20 ft in less than 5.5 sec, no assistive devices, good speed, no evidence for imbalance, normal gait pattern, deviates no more than 6 in outside of the 12 in walkway width.    Change in Gait Speed  Able to smoothly change walking speed without loss of balance or gait deviation. Deviate no more than 6 in outside of the 12 in walkway width.    Gait with Horizontal Head Turns  Performs head turns smoothly with no change in gait. Deviates no  more than 6 in outside 12 in walkway width    Gait with Vertical Head Turns  Performs head turns with no change in gait. Deviates no more than 6 in outside  12 in walkway width.    Gait and Pivot Turn  Pivot turns safely within 3 sec and stops quickly with no loss of balance.    Step Over Obstacle  Is able to step over one shoe box (4.5 in total height) without changing gait speed. No evidence of imbalance.    Gait with Narrow Base of Support  Ambulates 7-9 steps.    Gait with Eyes Closed  Walks 20 ft, uses assistive device, slower speed, mild gait deviations, deviates 6-10 in outside 12 in walkway width. Ambulates 20 ft in less than 9 sec but greater than 7 sec.   8.53 seconds   Ambulating Backwards  Walks 20 ft, slow speed, abnormal gait pattern, evidence for imbalance, deviates 10-15 in outside 12 in walkway width.   19.1 seconds   Steps  Alternating feet, must use rail.    Total Score  24    FGA comment:  24/30                Objective measurements completed on examination: See above findings.              PT Education - 07/31/19 1414    Education Details  clinical findings, fall risk, POC, BP contraindications for therapy    Person(s) Educated  Patient;Spouse    Methods  Explanation    Comprehension  Verbalized understanding       PT Short Term Goals - 07/31/19 1426      PT SHORT TERM GOAL #1   Title  Patient will be independent with initial HEP in order to build upon functional gains in therapy. ALL STGs DUE 08/29/19    Baseline  dependent with initial HEP    Time  3    Period  Weeks    Status  New    Target Date  08/29/19      PT SHORT TERM GOAL #2   Title  Patient will improve FGA score to at least 27/30 in order to decrease fall risk.    Baseline  24/30 on 07/31/19    Time  3    Period  Weeks    Status  New      PT SHORT TERM GOAL #3   Title  Patient will undergo further assessment for TUG and cog TUG to assess ability to dual task.    Baseline  Not yet assessed    Time  3    Period  Weeks    Status  New      PT SHORT TERM GOAL #4   Title  Patient will decrease 5 times sit <> stand score from  chair with no UE support to at least 13 seconds in order to improve functional LE strength.    Baseline  15 seconds    Time  3    Period  Weeks    Status  New      PT SHORT TERM GOAL #5   Title  Patient will ambulate 500' over level surfaces while scanning environment with supervision in order to improve community ambulation.    Baseline  not yet assessed for scanning environment    Time  3    Period  Weeks    Status  New        PT Long  Term Goals - 08/01/19 1450      PT LONG TERM GOAL #1   Title  Patient will be independent with final HEP in order to build upon functional gains in therapy. ALL LTGs DUE 09/19/19    Baseline  dependent with HEP    Time  6    Period  Weeks    Target Date  09/19/19      PT LONG TERM GOAL #2   Title  Patient will improve FGA score to at least 29/30 in order to decrease fall risk.    Baseline  24/30 on 07/31/19    Time  6    Period  Weeks    Status  New      PT LONG TERM GOAL #3   Title  Patient will decrease 5x sit <> stand score to 11 seconds or less from low blue mat table with no UE support in order to demonstrate improved functional LE strength.    Baseline  15 seconds from arm chair on 07/31/19    Time  6    Period  Weeks    Status  New      PT LONG TERM GOAL #4   Title  Patient will demonstrate less than 10% between TUG and cog TUG scores in order to demonstrate improvement with dual tasking and gait.    Baseline  not yet assessed    Time  6    Period  Weeks    Status  New      PT LONG TERM GOAL #5   Title  Patient will ambulate 700' over unlevel surfaces outdoors in grass/pavement while scanning environment and holding conversation with therapist with supervision in order to improve community ambulation.    Baseline  not yet assessed    Time  6    Period  Weeks    Status  New             Plan - 07/31/19 1421    Clinical Impression Statement  Patient is a 33 year old male referred to Neuro OPPT for evaluation s/p CVA. Pt  was admitted on 06/21/19 for hypertensive urgency; MRI showed numerous scattered acute infarcts in the cerebral hemispheres and brainstem, severe hyperintensity and expansion of the pons, medulla, and medial thalami, chronic small vessel disease. Pt's PMH is significant for: hypertension, durg abuse, HLD, acute kidney failure. The following deficits were present during the exam: impaired balance, difficulty descending stairs, unsteadiness during gait, decreased LE strength. Pt's FGA score indicate pt is at a medium risk for falls.  Pt would benefit from skilled PT to address these impairments and functional limitations to maximize functional mobility independence.    Personal Factors and Comorbidities  Comorbidity 3+;Past/Current Experience    Comorbidities  hypertension, durg abuse, HLD, acute kidney failure    Examination-Activity Limitations  Locomotion Level    Stability/Clinical Decision Making  Stable/Uncomplicated    Clinical Decision Making  Low    Rehab Potential  Good    PT Frequency  1x / week    PT Duration  6 weeks    PT Treatment/Interventions  ADLs/Self Care Home Management;Gait training;Stair training;Functional mobility training;Neuromuscular re-education;Balance training;Therapeutic exercise;Therapeutic activities;Patient/family education;Vestibular    PT Next Visit Plan  perform TUG/cog TUG. Initial HEP for balance and LE strengthening. Walking backwards, eyes closed balance. vestibular eval d/t dizziness?    Consulted and Agree with Plan of Care  Patient;Family member/caregiver    Family Member Consulted  wife, Lenox Ponds  Patient will benefit from skilled therapeutic intervention in order to improve the following deficits and impairments:  Abnormal gait, Decreased activity tolerance, Decreased balance, Decreased endurance, Decreased coordination, Difficulty walking, Decreased strength, Dizziness, Pain  Visit Diagnosis: 1. Unsteadiness on feet   2. History of falling   3.  Muscle weakness (generalized)   4. Difficulty in walking, not elsewhere classified        Problem List Patient Active Problem List   Diagnosis Date Noted  . Anemia of chronic disease 07/08/2019  . AKI (acute kidney injury) ([facility]) 07/08/2019  . Stroke ([facility]) 07/07/2019  . HLD (hyperlipidemia) 07/07/2019  . Polysubstance abuse ([facility]) 07/07/2019  . Tobacco abuse 07/07/2019  . Uncontrolled hypertension   . Acute kidney injury ([facility])   . PRES (posterior reversible encephalopathy syndrome) 06/29/2019  . 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 Hypertensive emergency 06/22/2019  . Acute renal failure ([facility])     Arliss Journey, PT, DPT 08/01/2019, 3:01 PM  [facility] [address] [place], [place], [address] Phone: [phone]   Fax:  [phone]  Name: Imer Antonetti MRN: [id number] Date of Birth: 08/23/1986

## 2019-08-01 NOTE — Addendum Note (Signed)
Addended by: Arliss Journey on: 08/01/2019 06:14 PM   Modules accepted: Orders

## 2019-08-02 ENCOUNTER — Other Ambulatory Visit: Payer: Self-pay

## 2019-08-02 ENCOUNTER — Ambulatory Visit: Payer: Medicaid Other | Admitting: Occupational Therapy

## 2019-08-02 ENCOUNTER — Encounter: Payer: Self-pay | Admitting: Occupational Therapy

## 2019-08-02 DIAGNOSIS — I69318 Other symptoms and signs involving cognitive functions following cerebral infarction: Secondary | ICD-10-CM

## 2019-08-02 DIAGNOSIS — R2681 Unsteadiness on feet: Secondary | ICD-10-CM | POA: Diagnosis not present

## 2019-08-02 NOTE — Therapy (Signed)
[facility] [address] [place], [place], [address] Phone: [phone]   Fax:  [phone]  Occupational Therapy Evaluation  Patient Details  Name: William [name] MRN: [id number] Date of Birth: Oct 17, 1986 Referring Provider (OT): Dr. Naaman Plummer   Encounter Date: 08/02/2019  OT End of Session - 08/02/19 1837    Visit Number  1    Number of Visits  1    Date for OT Re-Evaluation  --   n/a   Authorization Type  medicaid    OT Start Time  [id number]    OT Stop Time  1015    OT Time Calculation (min)  29 min    Activity Tolerance  Patient tolerated treatment well       Past Medical History:  Diagnosis Date  . Drug abuse, cocaine type ([place]) 06/23/2019  . Hyperlipidemia   . Hypertension     Past Surgical History:  Procedure Laterality Date  . RADIOLOGY WITH ANESTHESIA N/A 06/23/2019   Procedure: MRI OF VESSELS;  Surgeon: Radiologist, Medication, MD;  Location: [facility];  Service: Radiology;  Laterality: N/A;    There were no vitals filed for this visit.  Subjective Assessment - 08/02/19 0947    Subjective   I really don't get any headaches.    Patient is accompanied by:  Family member   wife [name]   Pertinent History  06/22/2019 admitted with HTN emergency, PRES  then MRI showed multi infracts throughtout both hemipheres and brainstem.  PMH:  HTN, chronic SVD, acure renal failure, polysubstance abuse.    Patient Stated Goals  get my leg and back stronger    Currently in Pain?  No/denies        [facility] OT Assessment - 08/02/19 0001      Assessment   Medical Diagnosis  Mulit infarct, PRES    Referring Provider (OT)  Dr. Naaman Plummer    Onset Date/Surgical Date  06/22/19    Hand Dominance  Left    Prior Therapy  CIR PT, OT and ST      Precautions   Precautions  Fall   pt seeing PT currently     Restrictions   Weight Bearing Restrictions  No      Balance Screen   Has the patient fallen in the past 6 months  Yes    last night tripped on my shoes; pt seeing PT currently     Home  Environment   Family/patient expects to be discharged to:  Private residence    Living Arrangements  Spouse/significant other   6, 83 and 33 year old   Type of [place]  One level    Bathroom Insurance account manager    Additional Comments  No equipment in the bathroom - pt reports he "needs a char in the shower"      Prior Function   Level of Independence  Independent    Vocation  Full time employment    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      ADL   Eating/Feeding  Minimal assistance   wife states pt needs help cutting food   Grooming  Minimal assistance   pt prefers wife to assist with shaving    Upper Body Bathing  Modified independent    Lower Body Bathing  Modified independent    Upper Body Dressing  Increased  time    Lower Body Dressing  Increased time    Toilet Transfer  Independent    Toileting - Clothing Manipulation  Independent    Toileting -  Hygiene  Independent    Tub/Shower Transfer  Modified independent   steadies sometimes on the towel rack   ADL comments  Pt states he has no equipment but feels he needs a shower seat - will further assess.       IADL   Shopping  Needs to be accompanied on any shopping trip   due to driving and balance   Light Housekeeping  Performs light daily tasks but cannot maintain acceptable level of cleanliness   son assisting with trash and mowing yard   Meal Prep  Able to complete simple cold meal and snack prep    Community Mobility  Relies on family or friends for transportation   pt states wife did most of driving before   Medication Management  Has difficulty remembering to take medication   wife sets up pill box and reminds pt to take   Financial Management  Requires assistance   pt did before but now wife assists     Mobility   Mobility Status  Needs  assist    Mobility Status Comments  balance gets worse when pt ambulates too long      Written Expression   Dominant Hand  Left      Vision - History   Baseline Vision  No visual deficits      Vision Assessment   Eye Alignment  Within Functional Limits    Ocular Range of Motion  Within Functional Limits    Tracking/Visual Pursuits  Able to track stimulus in all quads without difficulty    Convergence  Within functional limits    Visual Fields  No apparent deficits    Comment  Pt reports light sensitivity; Initially had visual vestibular issues but this is slowly resolving - pt generally only occassionally symptomatic when riding in car, or rapid head turns. pt does report small headache when trying to read.        Activity Tolerance   Activity Tolerance  Tolerate 30+ min activity without fatigue    Activity Tolerance Comments  Pt reports that he can do about 30 minutes and then is fatigued.  Pt takes rest every night.       Cognition   Overall Cognitive Status  Impaired/Different from baseline    Mini Mental State Exam   see ST eval for details      Sensation   Light Touch  Appears Intact    Hot/Cold  Appears Intact    Proprioception  Appears Intact      Coordination   Gross Motor Movements are Fluid and Coordinated  Yes    Fine Motor Movements are Fluid and Coordinated  Yes    Finger Nose Finger Test  WFL's      Tone   Assessment Location  Right Upper Extremity;Left Upper Extremity      ROM / Strength   AROM / PROM / Strength  AROM;Strength      AROM   Overall AROM   Within functional limits for tasks performed    Overall AROM Comments  BUE's      Strength   Overall Strength  Within functional limits for tasks performed    Overall Strength Comments  BUE"s      Hand Function   Right Hand Gross Grasp  Functional  Right Hand Grip (lbs)  75    Left Hand Gross Grasp  Functional    Left Hand Grip (lbs)  80      RUE Tone   RUE Tone  Within Functional Limits       LUE Tone   LUE Tone  Within Functional Limits                           OT Long Term Goals - 08/02/19 1831      OT LONG TERM GOAL #1   Title  n/a            Plan - 08/02/19 1831    Clinical Impression Statement  Pt is a 33 year old male admitted to the hospital via the ED in HTN emergency, and later diagnosed with PRES on 06/22/2019.Marland Kitchen  MRI also showed multi infracts in both hemispheres as well as brainstem (thought to be a separate process). PMH: HTN, poly substance abuse.  Pt was discharged home on 07/08/2019 after inpt rehab stay. Pt presents today with balance and cognitive deficits - ST currently addressing cognitve deficits and PT addressing balance deficits. Given current therapy interventions, feel at this time pt does not demonstrate an additional need for skilled OT services - pt and wife in agreement.    OT Occupational Profile and History  Detailed Assessment- Review of Records and additional review of physical, cognitive, psychosocial history related to current functional performance    Occupational performance deficits (Please refer to evaluation for details):  IADL's;Work;Leisure    Body Structure / Function / Physical Skills  Balance;Mobility;IADL    Cognitive Skills  Attention;Memory;Problem Solve;Safety Awareness    Rehab Potential  --   n/a   Clinical Decision Making  Limited treatment options, no task modification necessary    Comorbidities Affecting Occupational Performance:  Presence of comorbidities impacting occupational performance    Comorbidities impacting occupational performance description:  polysubstance abuse, HTN    OT Frequency  --   n/a   Plan  no further OT intervention as ST is addressing higher level cognition and PT is addressing balance. Pt and wife in agreement.    Consulted and Agree with Plan of Care  Patient;Family member/caregiver    Family Member Consulted  wife       Patient will benefit from skilled therapeutic  intervention in order to improve the following deficits and impairments:   Body Structure / Function / Physical Skills: Balance, Mobility, IADL Cognitive Skills: Attention, Memory, Problem Solve, Safety Awareness     Visit Diagnosis: 1. Other symptoms and signs involving cognitive functions following cerebral infarction       Problem List Patient Active Problem List   Diagnosis Date Noted  . Anemia of chronic disease 07/08/2019  . AKI (acute kidney injury) ([place]) 07/08/2019  . Stroke ([place]) 07/07/2019  . HLD (hyperlipidemia) 07/07/2019  . Polysubstance abuse ([place]) 07/07/2019  . Tobacco abuse 07/07/2019  . Uncontrolled hypertension   . Acute kidney injury ([place])   . PRES (posterior reversible encephalopathy syndrome) 06/29/2019  . 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 Hypertensive emergency 06/22/2019  . Acute renal failure [facility])     Quay Burow, OTR/L 08/02/2019, 6:39 PM  [facility] [address] [place], [place], [address] Phone: [phone]   Fax:  [phone]  Name: William [name] MRN: [id number] Date of Birth: 11/10/1986

## 2019-08-04 NOTE — Progress Notes (Deleted)
Cardiology Office Note   Date:  08/04/2019   ID:  William [name], DOB November 09, 1986, MRN [id number]  PCP:  Patient, No Pcp Per  Cardiologist:  Minus Breeding, MD EP: None  No chief complaint on file.     History of Present Illness: William [name] is a 33 y.o. male with PMH of HTN, HLD, and polysubstance abuse (cocaine, marijuana, benzos, and tobacco), with recent admission to the hospital 06/21/2019-06/29/2019 for hypertensive emergency complicated by CVA, PRES, and AKI, who presents for follow-up of HTN.   Patient was last evaluated by cardiology while admitted to the hospital 06/2019. He was seen for elevated troponins (peaked at 9,189) which were felt to be 2/2 demand ischemia with tachycardia and LVH. His stroke was not felt to be embolic in nature and there was no evidence of endocarditis on his echo. Renal arterial US was negative for renal artery stenosis. He was ultimately discharged home on amlodipine, carvedilol, hydralazine, and spironolactone for HTN management. His last echocardiogram was 06/22/2019 which showed EF >65%, severe concentric LVH, normal LV diastolic function, no RWMA, no significant valvular abnormalities. No prior ischemic evaluations.    He returns today for follow-up of his HTN. ***   1. HTN: BP *** today.   2. HLD: LDL 158 on labs 06/23/2019. He was started on atorvastatin  - Will repeat FLP and LFTs at this time.  - Continue atorvastatin  3. Polysubstance abuse: importance of abstinence from cocaine discussed, especially with use of BBlocker therapy.    Past Medical History:  Diagnosis Date  . Drug abuse, cocaine type ([place]) 06/23/2019  . Hyperlipidemia   . Hypertension     Past Surgical History:  Procedure Laterality Date  . RADIOLOGY WITH ANESTHESIA N/A 06/23/2019   Procedure: MRI OF VESSELS;  Surgeon: Radiologist, Medication, MD;  Location: [facility];  Service: Radiology;  Laterality: N/A;     Current Outpatient Medications  Medication Sig  Dispense Refill  . amLODipine (NORVASC) 10 MG tablet Take 1 tablet (10 mg total) by mouth at bedtime. 30 tablet 0  . aspirin 81 MG chewable tablet Chew 1 tablet (81 mg total) by mouth daily.    Marland Kitchen atorvastatin (LIPITOR) 40 MG tablet Take 1 tablet (40 mg total) by mouth daily at 6 PM.    . carvedilol (COREG) 25 MG tablet Take 1 tablet (25 mg total) by mouth 2 (two) times daily with a meal.    . clopidogrel (PLAVIX) 75 MG tablet Take 1 tablet (75 mg total) by mouth daily. 4 tablet 0  . hydrALAZINE (APRESOLINE) 100 MG tablet Take 1 tablet (100 mg total) by mouth every 8 (eight) hours. 30 tablet 3  . nicotine (NICODERM CQ - DOSED IN MG/24 HR) 7 mg/24hr patch Place 1 patch (7 mg total) onto the skin daily. (Patient taking differently: Place 3 mg onto the skin daily. ) 14 patch 0  . spironolactone (ALDACTONE) 25 MG tablet Take 1 tablet (25 mg total) by mouth daily. 30 tablet 3   No current facility-administered medications for this visit.     Allergies:   Reglan [metoclopramide]    Social History:  The patient  reports that he has been smoking. He has been smoking about 1.00 pack per day. He has never used smokeless tobacco. He reports current alcohol use. He reports current drug use. Drugs: Marijuana, Cocaine, and Benzodiazepines.   Family History:  The patient's ***family history includes Diabetes in his maternal uncle and sister.    ROS:  Please  see the history of present illness.   Otherwise, review of systems are positive for {NONE DEFAULTED:18576::"none"}.   All other systems are reviewed and negative.    PHYSICAL EXAM: VS:  There were no vitals taken for this visit. , BMI There is no height or weight on file to calculate BMI. GEN: Well nourished, well developed, in no acute distress HEENT: normal Neck: no JVD, carotid bruits, or masses Cardiac: ***RRR; no murmurs, rubs, or gallops,no edema  Respiratory:  clear to auscultation bilaterally, normal work of breathing GI: soft, nontender,  nondistended, + BS MS: no deformity or atrophy Skin: warm and dry, no rash Neuro:  Strength and sensation are intact Psych: euthymic mood, full affect   EKG:  EKG {ACTION; IS/IS [id number] ordered today. The ekg ordered today demonstrates ***   Recent Labs: 06/27/2019: Magnesium 1.8 07/10/2019: ALT 19; BUN 50; Creatinine, Ser 7.36; Hemoglobin 10.3; Platelets 506; Potassium 4.2; Sodium 132    Lipid Panel    Component Value Date/Time   CHOL 253 (H) 06/23/2019 0301   TRIG 142 06/23/2019 0301   HDL 67 06/23/2019 0301   CHOLHDL 3.8 06/23/2019 0301   VLDL 28 06/23/2019 0301   LDLCALC 158 (H) 06/23/2019 0301      Wt Readings from Last 3 Encounters:  07/07/19 184 lb (83.5 kg)  07/07/19 194 lb 0.1 oz (88 kg)  06/25/19 189 lb (85.7 kg)      Other studies Reviewed: Additional studies/ records that were reviewed today include:   Echocardiogram 06/2019: IMPRESSIONS    1. The left ventricle has hyperdynamic systolic function, with an ejection fraction of >65%. The cavity size was decreased. There is severe concentric left ventricular hypertrophy. Left ventricular diastolic parameters were normal. No evidence of left  ventricular regional wall motion abnormalities.  2. The right ventricle has normal systolic function. The cavity was normal. There is no increase in right ventricular wall thickness. Right ventricular systolic pressure could not be assessed.  3. Small pericardial effusion.  4. No evidence of mitral valve stenosis.  5. The aortic valve is tricuspid. No stenosis of the aortic valve.  SUMMARY   Hyperdynamic LV and RV function, with near collapse of the intraventricular chambers during systole; consider hypovolemia. Severe concentric LVH, with LV gradient of at least 40 mmHg in the mid cavity and 38mmHg near LVOT. No SAM of the mitral valve seen on this study.    ASSESSMENT AND PLAN:  1.  ***   Current medicines are reviewed at length with the patient  today.  The patient {ACTIONS; HAS/DOES NOT HAVE:19233} concerns regarding medicines.  The following changes have been made:  {PLAN; NO CHANGE:13088:s}  Labs/ tests ordered today include: *** No orders of the defined types were placed in this encounter.    Disposition:   FU with *** in {gen number [id number] {Days to years:10300}  Signed, Abigail Butts, PA-C  08/04/2019 5:24 PM

## 2019-08-08 ENCOUNTER — Encounter (HOSPITAL_COMMUNITY)
Admission: EM | Disposition: A | Discharge status: Home / Self Care | Payer: Self-pay | Source: Home / Self Care | Attending: Internal Medicine

## 2019-08-08 ENCOUNTER — Inpatient Hospital Stay (HOSPITAL_COMMUNITY): Payer: Medicaid Other

## 2019-08-08 ENCOUNTER — Ambulatory Visit: Payer: Medicaid Other | Admitting: Medical

## 2019-08-08 ENCOUNTER — Inpatient Hospital Stay (HOSPITAL_COMMUNITY)
Admission: EM | Admit: 2019-08-08 | Discharge: 2019-08-10 | DRG: 640 | Disposition: A | Discharge status: Home / Self Care | Payer: Medicaid Other | Attending: Internal Medicine | Admitting: Internal Medicine

## 2019-08-08 ENCOUNTER — Inpatient Hospital Stay (HOSPITAL_COMMUNITY): Payer: Medicaid Other | Admitting: Certified Registered Nurse Anesthetist

## 2019-08-08 ENCOUNTER — Other Ambulatory Visit: Payer: Self-pay

## 2019-08-08 ENCOUNTER — Encounter (HOSPITAL_COMMUNITY): Payer: Self-pay | Admitting: Emergency Medicine

## 2019-08-08 ENCOUNTER — Emergency Department (HOSPITAL_COMMUNITY): Payer: Medicaid Other

## 2019-08-08 DIAGNOSIS — Z7982 Long term (current) use of aspirin: Secondary | ICD-10-CM | POA: Diagnosis not present

## 2019-08-08 DIAGNOSIS — Z7902 Long term (current) use of antithrombotics/antiplatelets: Secondary | ICD-10-CM | POA: Diagnosis not present

## 2019-08-08 DIAGNOSIS — Z716 Tobacco abuse counseling: Secondary | ICD-10-CM | POA: Diagnosis not present

## 2019-08-08 DIAGNOSIS — Z79899 Other long term (current) drug therapy: Secondary | ICD-10-CM | POA: Diagnosis not present

## 2019-08-08 DIAGNOSIS — F1290 Cannabis use, unspecified, uncomplicated: Secondary | ICD-10-CM | POA: Diagnosis present

## 2019-08-08 DIAGNOSIS — Z20828 Contact with and (suspected) exposure to other viral communicable diseases: Secondary | ICD-10-CM | POA: Diagnosis present

## 2019-08-08 DIAGNOSIS — I120 Hypertensive chronic kidney disease with stage 5 chronic kidney disease or end stage renal disease: Secondary | ICD-10-CM | POA: Diagnosis present

## 2019-08-08 DIAGNOSIS — F1990 Other psychoactive substance use, unspecified, uncomplicated: Secondary | ICD-10-CM | POA: Diagnosis present

## 2019-08-08 DIAGNOSIS — I160 Hypertensive urgency: Secondary | ICD-10-CM | POA: Diagnosis present

## 2019-08-08 DIAGNOSIS — Z8673 Personal history of transient ischemic attack (TIA), and cerebral infarction without residual deficits: Secondary | ICD-10-CM

## 2019-08-08 DIAGNOSIS — Z888 Allergy status to other drugs, medicaments and biological substances status: Secondary | ICD-10-CM

## 2019-08-08 DIAGNOSIS — N186 End stage renal disease: Secondary | ICD-10-CM | POA: Diagnosis present

## 2019-08-08 DIAGNOSIS — Z419 Encounter for procedure for purposes other than remedying health state, unspecified: Secondary | ICD-10-CM

## 2019-08-08 DIAGNOSIS — F1410 Cocaine abuse, uncomplicated: Secondary | ICD-10-CM | POA: Diagnosis present

## 2019-08-08 DIAGNOSIS — N179 Acute kidney failure, unspecified: Secondary | ICD-10-CM

## 2019-08-08 DIAGNOSIS — F17210 Nicotine dependence, cigarettes, uncomplicated: Secondary | ICD-10-CM | POA: Diagnosis present

## 2019-08-08 DIAGNOSIS — F1910 Other psychoactive substance abuse, uncomplicated: Secondary | ICD-10-CM | POA: Diagnosis present

## 2019-08-08 DIAGNOSIS — D509 Iron deficiency anemia, unspecified: Secondary | ICD-10-CM | POA: Diagnosis present

## 2019-08-08 DIAGNOSIS — N185 Chronic kidney disease, stage 5: Secondary | ICD-10-CM

## 2019-08-08 DIAGNOSIS — E785 Hyperlipidemia, unspecified: Secondary | ICD-10-CM | POA: Diagnosis present

## 2019-08-08 DIAGNOSIS — N19 Unspecified kidney failure: Secondary | ICD-10-CM

## 2019-08-08 DIAGNOSIS — E8770 Fluid overload, unspecified: Secondary | ICD-10-CM | POA: Diagnosis present

## 2019-08-08 DIAGNOSIS — D631 Anemia in chronic kidney disease: Secondary | ICD-10-CM | POA: Diagnosis present

## 2019-08-08 DIAGNOSIS — I639 Cerebral infarction, unspecified: Secondary | ICD-10-CM | POA: Diagnosis present

## 2019-08-08 DIAGNOSIS — D649 Anemia, unspecified: Secondary | ICD-10-CM

## 2019-08-08 DIAGNOSIS — Z720 Tobacco use: Secondary | ICD-10-CM | POA: Diagnosis present

## 2019-08-08 DIAGNOSIS — I10 Essential (primary) hypertension: Secondary | ICD-10-CM | POA: Diagnosis present

## 2019-08-08 DIAGNOSIS — Z992 Dependence on renal dialysis: Secondary | ICD-10-CM | POA: Diagnosis present

## 2019-08-08 HISTORY — PX: INSERTION OF DIALYSIS CATHETER: SHX1324

## 2019-08-08 HISTORY — DX: End stage renal disease: N18.6

## 2019-08-08 LAB — URINALYSIS, ROUTINE W REFLEX MICROSCOPIC
Bilirubin Urine: NEGATIVE
Glucose, UA: NEGATIVE mg/dL
Hgb urine dipstick: NEGATIVE
Ketones, ur: NEGATIVE mg/dL
Leukocytes,Ua: NEGATIVE
Nitrite: NEGATIVE
Protein, ur: >=300 mg/dL — AB
Specific Gravity, Urine: 1.012 (ref 1.005–1.030)
pH: 5 (ref 5.0–8.0)

## 2019-08-08 LAB — BASIC METABOLIC PANEL
Anion gap: 21 — ABNORMAL HIGH (ref 5–15)
BUN: 117 mg/dL — ABNORMAL HIGH (ref 6–20)
CO2: 15 mmol/L — ABNORMAL LOW (ref 22–32)
Calcium: 9.8 mg/dL (ref 8.9–10.3)
Chloride: 99 mmol/L (ref 98–111)
Creatinine, Ser: 27.56 mg/dL — ABNORMAL HIGH (ref 0.61–1.24)
Glucose, Bld: 119 mg/dL — ABNORMAL HIGH (ref 70–99)
Potassium: 4 mmol/L (ref 3.5–5.1)
Sodium: 135 mmol/L (ref 135–145)

## 2019-08-08 LAB — CBC WITH DIFFERENTIAL/PLATELET
Abs Immature Granulocytes: 0.08 10*3/uL — ABNORMAL HIGH (ref 0.00–0.07)
Basophils Absolute: 0 10*3/uL (ref 0.0–0.1)
Basophils Relative: 1 %
Eosinophils Absolute: 0.2 10*3/uL (ref 0.0–0.5)
Eosinophils Relative: 3 %
HCT: 22.3 % — ABNORMAL LOW (ref 39.0–52.0)
Hemoglobin: 7.3 g/dL — ABNORMAL LOW (ref 13.0–17.0)
Immature Granulocytes: 1 %
Lymphocytes Relative: 23 %
Lymphs Abs: 1.8 10*3/uL (ref 0.7–4.0)
MCH: 31.1 pg (ref 26.0–34.0)
MCHC: 32.7 g/dL (ref 30.0–36.0)
MCV: 94.9 fL (ref 80.0–100.0)
Monocytes Absolute: 0.7 10*3/uL (ref 0.1–1.0)
Monocytes Relative: 9 %
Neutro Abs: 4.9 10*3/uL (ref 1.7–7.7)
Neutrophils Relative %: 63 %
Platelets: 301 10*3/uL (ref 150–400)
RBC: 2.35 MIL/uL — ABNORMAL LOW (ref 4.22–5.81)
RDW: 12.8 % (ref 11.5–15.5)
WBC: 7.7 10*3/uL (ref 4.0–10.5)
nRBC: 0 % (ref 0.0–0.2)

## 2019-08-08 LAB — RAPID URINE DRUG SCREEN, HOSP PERFORMED
Amphetamines: NOT DETECTED
Barbiturates: NOT DETECTED
Benzodiazepines: NOT DETECTED
Cocaine: NOT DETECTED
Opiates: NOT DETECTED
Tetrahydrocannabinol: NOT DETECTED

## 2019-08-08 LAB — IRON AND TIBC
Iron: 30 ug/dL — ABNORMAL LOW (ref 45–182)
Saturation Ratios: 10 % — ABNORMAL LOW (ref 17.9–39.5)
TIBC: 311 ug/dL (ref 250–450)
UIBC: 281 ug/dL

## 2019-08-08 LAB — SARS CORONAVIRUS 2 (TAT 6-24 HRS): SARS Coronavirus 2: NEGATIVE

## 2019-08-08 LAB — ABO/RH: ABO/RH(D): O POS

## 2019-08-08 LAB — TYPE AND SCREEN
ABO/RH(D): O POS
Antibody Screen: NEGATIVE

## 2019-08-08 LAB — PHOSPHORUS: Phosphorus: 7.7 mg/dL — ABNORMAL HIGH (ref 2.5–4.6)

## 2019-08-08 LAB — MAGNESIUM: Magnesium: 2.4 mg/dL (ref 1.7–2.4)

## 2019-08-08 SURGERY — INSERTION OF DIALYSIS CATHETER
Anesthesia: General | Site: Neck | Laterality: Right

## 2019-08-08 SURGERY — INSERTION OF DIALYSIS CATHETER
Anesthesia: General

## 2019-08-08 MED ORDER — PROPOFOL 10 MG/ML IV BOLUS
INTRAVENOUS | Status: DC | PRN
  Administered 2019-08-08: 200 mg via INTRAVENOUS

## 2019-08-08 MED ORDER — SODIUM CHLORIDE 0.9 % IV SOLN
INTRAVENOUS | Status: DC | PRN
  Administered 2019-08-08: 17:00:00 500 mL

## 2019-08-08 MED ORDER — DEXAMETHASONE SODIUM PHOSPHATE 10 MG/ML IJ SOLN
INTRAMUSCULAR | Status: AC
  Filled 2019-08-08: qty 1

## 2019-08-08 MED ORDER — CEFAZOLIN SODIUM 1 G IJ SOLR
INTRAMUSCULAR | Status: AC
  Filled 2019-08-08: qty 20

## 2019-08-08 MED ORDER — HEPARIN SODIUM (PORCINE) 1000 UNIT/ML IJ SOLN
INTRAMUSCULAR | Status: AC
  Filled 2019-08-08: qty 1

## 2019-08-08 MED ORDER — DEXAMETHASONE SODIUM PHOSPHATE 10 MG/ML IJ SOLN
INTRAMUSCULAR | Status: DC | PRN
  Administered 2019-08-08: 10 mg via INTRAVENOUS

## 2019-08-08 MED ORDER — HYDRALAZINE HCL 50 MG PO TABS
100.0000 mg | ORAL_TABLET | Freq: Three times a day (TID) | ORAL | Status: DC
  Administered 2019-08-08 – 2019-08-10 (×4): 100 mg via ORAL
  Filled 2019-08-08 (×7): qty 2

## 2019-08-08 MED ORDER — SORBITOL 70 % SOLN
30.0000 mL | Status: DC | PRN
  Filled 2019-08-08: qty 30

## 2019-08-08 MED ORDER — MIDAZOLAM HCL 2 MG/2ML IJ SOLN
INTRAMUSCULAR | Status: AC
  Filled 2019-08-08: qty 2

## 2019-08-08 MED ORDER — HEPARIN SODIUM (PORCINE) 5000 UNIT/ML IJ SOLN
5000.0000 [IU] | Freq: Three times a day (TID) | INTRAMUSCULAR | Status: DC
  Administered 2019-08-08 – 2019-08-10 (×4): 5000 [IU] via SUBCUTANEOUS
  Filled 2019-08-08 (×5): qty 1

## 2019-08-08 MED ORDER — ONDANSETRON HCL 4 MG PO TABS: 4.0000 mg | ORAL_TABLET | Freq: Four times a day (QID) | ORAL | Status: DC | PRN

## 2019-08-08 MED ORDER — ACETAMINOPHEN 650 MG RE SUPP: 650.0000 mg | Freq: Four times a day (QID) | RECTAL | Status: DC | PRN

## 2019-08-08 MED ORDER — PHENYLEPHRINE 40 MCG/ML (10ML) SYRINGE FOR IV PUSH (FOR BLOOD PRESSURE SUPPORT)
PREFILLED_SYRINGE | INTRAVENOUS | Status: DC | PRN
  Administered 2019-08-08 (×2): 80 ug via INTRAVENOUS

## 2019-08-08 MED ORDER — LIDOCAINE 2% (20 MG/ML) 5 ML SYRINGE
INTRAMUSCULAR | Status: AC
  Filled 2019-08-08: qty 5

## 2019-08-08 MED ORDER — ASPIRIN 81 MG PO CHEW
81.0000 mg | CHEWABLE_TABLET | Freq: Every day | ORAL | Status: DC
  Administered 2019-08-08 – 2019-08-10 (×3): 81 mg via ORAL
  Filled 2019-08-08 (×3): qty 1

## 2019-08-08 MED ORDER — SODIUM CHLORIDE 0.9 % IV SOLN
INTRAVENOUS | Status: DC
  Filled 2019-08-08 (×2): qty 1.2

## 2019-08-08 MED ORDER — ACETAMINOPHEN 325 MG PO TABS
650.0000 mg | ORAL_TABLET | Freq: Four times a day (QID) | ORAL | Status: DC | PRN
  Administered 2019-08-08: 650 mg via ORAL
  Filled 2019-08-08: qty 2

## 2019-08-08 MED ORDER — HEPARIN SODIUM (PORCINE) 1000 UNIT/ML IJ SOLN
INTRAMUSCULAR | Status: DC | PRN
  Administered 2019-08-08: 3400 [IU]

## 2019-08-08 MED ORDER — ONDANSETRON HCL 4 MG/2ML IJ SOLN
INTRAMUSCULAR | Status: DC | PRN
  Administered 2019-08-08: 4 mg via INTRAVENOUS

## 2019-08-08 MED ORDER — MINOXIDIL 2.5 MG PO TABS
2.5000 mg | ORAL_TABLET | Freq: Two times a day (BID) | ORAL | Status: DC
  Administered 2019-08-08 – 2019-08-10 (×4): 2.5 mg via ORAL
  Filled 2019-08-08 (×8): qty 1

## 2019-08-08 MED ORDER — ZOLPIDEM TARTRATE 5 MG PO TABS: 5.0000 mg | ORAL_TABLET | Freq: Every evening | ORAL | Status: DC | PRN

## 2019-08-08 MED ORDER — HYDROXYZINE HCL 25 MG PO TABS: 25.0000 mg | ORAL_TABLET | Freq: Three times a day (TID) | ORAL | Status: DC | PRN

## 2019-08-08 MED ORDER — OXYCODONE HCL 5 MG/5ML PO SOLN: 5.0000 mg | Freq: Once | ORAL | Status: DC | PRN

## 2019-08-08 MED ORDER — LIDOCAINE 2% (20 MG/ML) 5 ML SYRINGE
INTRAMUSCULAR | Status: DC | PRN
  Administered 2019-08-08: 60 mg via INTRAVENOUS

## 2019-08-08 MED ORDER — CALCIUM CARBONATE ANTACID 1250 MG/5ML PO SUSP
500.0000 mg | Freq: Four times a day (QID) | ORAL | Status: DC | PRN
  Filled 2019-08-08: qty 5

## 2019-08-08 MED ORDER — PROMETHAZINE HCL 25 MG/ML IJ SOLN: 6.2500 mg | INTRAMUSCULAR | Status: DC | PRN

## 2019-08-08 MED ORDER — SODIUM CHLORIDE 0.9 % IV SOLN
INTRAVENOUS | Status: DC
  Administered 2019-08-08: 17:00:00 via INTRAVENOUS

## 2019-08-08 MED ORDER — OXYCODONE-ACETAMINOPHEN 5-325 MG PO TABS
1.0000 | ORAL_TABLET | ORAL | Status: DC | PRN
  Administered 2019-08-08 – 2019-08-09 (×2): 1 via ORAL
  Filled 2019-08-08 (×2): qty 1

## 2019-08-08 MED ORDER — HYDROMORPHONE HCL 1 MG/ML IJ SOLN: 0.2500 mg | INTRAMUSCULAR | Status: DC | PRN

## 2019-08-08 MED ORDER — DOCUSATE SODIUM 283 MG RE ENEM
1.0000 | ENEMA | RECTAL | Status: DC | PRN
  Filled 2019-08-08: qty 1

## 2019-08-08 MED ORDER — CLOPIDOGREL BISULFATE 75 MG PO TABS
75.0000 mg | ORAL_TABLET | Freq: Every day | ORAL | Status: DC
  Administered 2019-08-08 – 2019-08-10 (×3): 75 mg via ORAL
  Filled 2019-08-08 (×3): qty 1

## 2019-08-08 MED ORDER — FENTANYL CITRATE (PF) 250 MCG/5ML IJ SOLN
INTRAMUSCULAR | Status: AC
  Filled 2019-08-08: qty 5

## 2019-08-08 MED ORDER — MIDAZOLAM HCL 2 MG/2ML IJ SOLN
INTRAMUSCULAR | Status: DC | PRN
  Administered 2019-08-08: 2 mg via INTRAVENOUS

## 2019-08-08 MED ORDER — ONDANSETRON HCL 4 MG/2ML IJ SOLN
INTRAMUSCULAR | Status: AC
  Filled 2019-08-08: qty 2

## 2019-08-08 MED ORDER — CAMPHOR-MENTHOL 0.5-0.5 % EX LOTN
1.0000 "application " | TOPICAL_LOTION | Freq: Three times a day (TID) | CUTANEOUS | Status: DC | PRN
  Filled 2019-08-08: qty 222

## 2019-08-08 MED ORDER — PROPOFOL 10 MG/ML IV BOLUS
INTRAVENOUS | Status: AC
  Filled 2019-08-08: qty 20

## 2019-08-08 MED ORDER — ONDANSETRON HCL 4 MG/2ML IJ SOLN: 4.0000 mg | Freq: Four times a day (QID) | INTRAMUSCULAR | Status: DC | PRN

## 2019-08-08 MED ORDER — NEPRO/CARBSTEADY PO LIQD
237.0000 mL | Freq: Three times a day (TID) | ORAL | Status: DC | PRN
  Filled 2019-08-08: qty 237

## 2019-08-08 MED ORDER — DOCUSATE SODIUM 100 MG PO CAPS
100.0000 mg | ORAL_CAPSULE | Freq: Two times a day (BID) | ORAL | Status: DC
  Administered 2019-08-08 – 2019-08-10 (×4): 100 mg via ORAL
  Filled 2019-08-08 (×4): qty 1

## 2019-08-08 MED ORDER — FENTANYL CITRATE (PF) 250 MCG/5ML IJ SOLN
INTRAMUSCULAR | Status: DC | PRN
  Administered 2019-08-08: 100 ug via INTRAVENOUS
  Administered 2019-08-08: 50 ug via INTRAVENOUS

## 2019-08-08 MED ORDER — LIDOCAINE-EPINEPHRINE (PF) 1 %-1:200000 IJ SOLN
INTRAMUSCULAR | Status: AC
  Filled 2019-08-08: qty 30

## 2019-08-08 MED ORDER — ACETAMINOPHEN 10 MG/ML IV SOLN: 1000.0000 mg | Freq: Once | INTRAVENOUS | Status: DC | PRN

## 2019-08-08 MED ORDER — PHENYLEPHRINE 40 MCG/ML (10ML) SYRINGE FOR IV PUSH (FOR BLOOD PRESSURE SUPPORT)
PREFILLED_SYRINGE | INTRAVENOUS | Status: AC
  Filled 2019-08-08: qty 10

## 2019-08-08 MED ORDER — AMLODIPINE BESYLATE 10 MG PO TABS
10.0000 mg | ORAL_TABLET | Freq: Every day | ORAL | Status: DC
  Administered 2019-08-08 – 2019-08-09 (×2): 10 mg via ORAL
  Filled 2019-08-08 (×2): qty 1

## 2019-08-08 MED ORDER — CEFAZOLIN SODIUM-DEXTROSE 2-3 GM-%(50ML) IV SOLR
INTRAVENOUS | Status: DC | PRN
  Administered 2019-08-08: 2 g via INTRAVENOUS

## 2019-08-08 MED ORDER — LIDOCAINE HCL (PF) 1 % IJ SOLN
INTRAMUSCULAR | Status: AC
  Filled 2019-08-08: qty 30

## 2019-08-08 MED ORDER — ATORVASTATIN CALCIUM 40 MG PO TABS
40.0000 mg | ORAL_TABLET | Freq: Every day | ORAL | Status: DC
  Administered 2019-08-08 – 2019-08-10 (×3): 40 mg via ORAL
  Filled 2019-08-08 (×4): qty 1

## 2019-08-08 MED ORDER — CARVEDILOL 25 MG PO TABS
25.0000 mg | ORAL_TABLET | Freq: Two times a day (BID) | ORAL | Status: DC
  Administered 2019-08-09 – 2019-08-10 (×3): 25 mg via ORAL
  Filled 2019-08-08 (×4): qty 1

## 2019-08-08 MED ORDER — OXYCODONE HCL 5 MG PO TABS: 5.0000 mg | ORAL_TABLET | Freq: Once | ORAL | Status: DC | PRN

## 2019-08-08 SURGICAL SUPPLY — 37 items
BAG DECANTER FOR FLEXI CONT (MISCELLANEOUS) ×3 IMPLANT
BIOPATCH RED 1 DISK 7.0 (GAUZE/BANDAGES/DRESSINGS) ×2 IMPLANT
BIOPATCH RED 1IN DISK 7.0MM (GAUZE/BANDAGES/DRESSINGS) ×1
CATH PALINDROME RT-P 15FX19CM (CATHETERS) IMPLANT
CATH PALINDROME RT-P 15FX23CM (CATHETERS) ×3 IMPLANT
CATH PALINDROME RT-P 15FX28CM (CATHETERS) IMPLANT
CATH PALINDROME RT-P 15FX55CM (CATHETERS) IMPLANT
CHLORAPREP W/TINT 26 (MISCELLANEOUS) ×3 IMPLANT
COVER PROBE W GEL 5X96 (DRAPES) ×3 IMPLANT
COVER SURGICAL LIGHT HANDLE (MISCELLANEOUS) ×3 IMPLANT
COVER WAND RF STERILE (DRAPES) IMPLANT
DERMABOND ADVANCED (GAUZE/BANDAGES/DRESSINGS) ×2
DERMABOND ADVANCED .7 DNX12 (GAUZE/BANDAGES/DRESSINGS) ×1 IMPLANT
DRAPE C-ARM 42X72 X-RAY (DRAPES) ×3 IMPLANT
DRAPE CHEST BREAST 15X10 FENES (DRAPES) ×3 IMPLANT
GAUZE 4X4 16PLY RFD (DISPOSABLE) ×3 IMPLANT
GLOVE BIO SURGEON STRL SZ7.5 (GLOVE) ×3 IMPLANT
GLOVE BIOGEL PI IND STRL 8 (GLOVE) ×1 IMPLANT
GLOVE BIOGEL PI INDICATOR 8 (GLOVE) ×2
GOWN STRL REUS W/ TWL LRG LVL3 (GOWN DISPOSABLE) ×2 IMPLANT
GOWN STRL REUS W/TWL LRG LVL3 (GOWN DISPOSABLE) ×4
KIT BASIN OR (CUSTOM PROCEDURE TRAY) ×3 IMPLANT
KIT TURNOVER KIT B (KITS) ×3 IMPLANT
NEEDLE 18GX1X1/2 (RX/OR ONLY) (NEEDLE) ×3 IMPLANT
NEEDLE HYPO 25GX1X1/2 BEV (NEEDLE) ×3 IMPLANT
NS IRRIG 1000ML POUR BTL (IV SOLUTION) IMPLANT
PACK SURGICAL SETUP 50X90 (CUSTOM PROCEDURE TRAY) ×3 IMPLANT
PAD ARMBOARD 7.5X6 YLW CONV (MISCELLANEOUS) IMPLANT
SUT ETHILON 3 0 PS 1 (SUTURE) ×3 IMPLANT
SUT VICRYL 4-0 PS2 18IN ABS (SUTURE) ×3 IMPLANT
SYR 10ML LL (SYRINGE) ×3 IMPLANT
SYR 20ML LL LF (SYRINGE) ×6 IMPLANT
SYR 5ML LL (SYRINGE) ×6 IMPLANT
SYR CONTROL 10ML LL (SYRINGE) ×3 IMPLANT
TOWEL GREEN STERILE (TOWEL DISPOSABLE) IMPLANT
TOWEL GREEN STERILE FF (TOWEL DISPOSABLE) ×3 IMPLANT
WATER STERILE IRR 1000ML POUR (IV SOLUTION) ×3 IMPLANT

## 2019-08-08 NOTE — H&P (Addendum)
History and Physical    William [name] [id number] DOB: 01-27-1986 DOA: 08/08/2019  PCP:  [facility] Consultants:  Nephrology - scheduled for first appointment tomorrow, Dr. Johnney Ou Patient coming from:  Home - lives with wife, 3 kids; NOK: Wife, [phone]  Chief Complaint: SOB  HPI: William [name] is a 33 y.o. male with medical history significant of HTN; HLD; polysubstance dependence (tobacco, cocaine, marijuana, BZD); and CVA in July 2020.  He was hospitalized for acute CVA with hypertensive urgency and AKI from 7/8-16 and completed inpatient rehab on 7/24.  He returned back to the acute care side from 7/24-25 for AKI; since he was still making good urine and not uremic, the decision was made to plan for outpatient nephrology f/u.  Over the last couple of days, he has noticed SOB.  He has had bad breath and everything tastes horrible.  +SOB, neck and lower back pain, body aches.  No COVID contacts, negative last month.  +vomiting, increased from prior.  He takes his medicine on a schedule but has been afraid for him to take medication and eating - worried about throwing up.  His SOB was the driving force, felt like he needed to come back in.  +orthopnea.  +DOE. No fever.  No sick contacts.  Of note, patient initially presented to the ER at 0013.  He was called for vitals at 0806 and was determined to have LWBS at 0836.  However, based on his clearly abnormal labs, Dr. Regenia Skeeter called his wife and she brought the patient back to the ER.   ED Course:  D/C 1 month ago, no PCP.  C/o SOB, likely worsening anemia.  Creatinine 27.56, acidosis.  Still making urine, does not look volume overloaded.  Review of Systems: As per HPI; otherwise review of systems reviewed and negative.   Ambulatory Status:  Ambulates without assistance  Past Medical History:  Diagnosis Date  . Drug abuse, cocaine type ([place]) 06/23/2019  . ESRD (end stage renal disease) ([place]) 08/08/2019  .  Hyperlipidemia   . Hypertension   . Stroke [facility]) 06/2019    Past Surgical History:  Procedure Laterality Date  . RADIOLOGY WITH ANESTHESIA N/A 06/23/2019   Procedure: MRI OF VESSELS;  Surgeon: Radiologist, Medication, MD;  Location: [facility];  Service: Radiology;  Laterality: N/A;    Social History   Socioeconomic History  . Marital status: Married    Spouse name: Not on file  . Number of children: Not on file  . Years of education: Not on file  . Highest education level: Not on file  Occupational History  . Occupation: Secretary/administrator - laid off  Social Needs  . Financial resource strain: Not on file  . Food insecurity    Worry: Not on file    Inability: Not on file  . Transportation needs    Medical: Not on file    Non-medical: Not on file  Tobacco Use  . Smoking status: Former Smoker    Packs/day: 1.00    Years: 20.00    Pack years: 20.00    Quit date: 06/2019    Years since quitting: 0.1  . Smokeless tobacco: Never Used  Substance and Sexual Activity  . Alcohol use: Not Currently    Comment: 2-3 beers a day; stopped with 7/20 hospitalization  . Drug use: Yes    Types: Marijuana, Cocaine, Benzodiazepines    Comment: denies use since 7/20 hospitalization  . Sexual activity: Not on file  Lifestyle  . Physical  activity    Days per week: Not on file    Minutes per session: Not on file  . Stress: Not on file  Relationships  . Social Herbalist on phone: Not on file    Gets together: Not on file    Attends religious service: Not on file    Active member of club or organization: Not on file    Attends meetings of clubs or organizations: Not on file    Relationship status: Not on file  . Intimate partner violence    Fear of current or ex partner: Not on file    Emotionally abused: Not on file    Physically abused: Not on file    Forced sexual activity: Not on file  Other Topics Concern  . Not on file  Social History Narrative  . Not on file    Allergies   Allergen Reactions  . Reglan [Metoclopramide]     Develops akathisia    Family History  Problem Relation Age of Onset  . Diabetes Sister   . Diabetes Maternal Uncle   . Renal Disease Neg Hx     Prior to Admission medications   Medication Sig Start Date End Date Taking? Authorizing Provider  amLODipine (NORVASC) 10 MG tablet Take 1 tablet (10 mg total) by mouth at bedtime. 07/08/19  Yes Debbe Odea, MD  aspirin 81 MG chewable tablet Chew 1 tablet (81 mg total) by mouth daily. 06/30/19  Yes Shelly Coss, MD  atorvastatin (LIPITOR) 40 MG tablet Take 1 tablet (40 mg total) by mouth daily at 6 PM. 06/29/19  Yes Adhikari, Tamsen Meek, MD  carvedilol (COREG) 25 MG tablet Take 1 tablet (25 mg total) by mouth 2 (two) times daily with a meal. 06/29/19  Yes Adhikari, Tamsen Meek, MD  clopidogrel (PLAVIX) 75 MG tablet Take 1 tablet (75 mg total) by mouth daily. 07/09/19  Yes Debbe Odea, MD  hydrALAZINE (APRESOLINE) 100 MG tablet Take 1 tablet (100 mg total) by mouth every 8 (eight) hours. 07/08/19  Yes Debbe Odea, MD  minoxidil (LONITEN) 2.5 MG tablet Take 2.5 mg by mouth 2 (two) times daily. 07/13/19  Yes [provider]  nicotine (NICODERM CQ - DOSED IN MG/24 HR) 7 mg/24hr patch Place 1 patch (7 mg total) onto the skin daily. Patient taking differently: Place 3 mg onto the skin daily.  07/08/19  Yes Debbe Odea, MD  spironolactone (ALDACTONE) 25 MG tablet Take 1 tablet (25 mg total) by mouth daily. 07/09/19  Yes Debbe Odea, MD    Physical Exam: Vitals:   08/08/19 0930 08/08/19 0945 08/08/19 1000 08/08/19 1015  BP: (!) 143/86 (!) 143/82 (!) 142/86 (!) 143/87  Pulse: 88 87 87 85  Resp: (!) 21 (!) 23 (!) 24 (!) 22  Temp:      TempSrc:      SpO2: 97% 98% 96% 97%     . General:  Appears calm and comfortable and is NAD . Eyes:  PERRL, EOMI, normal lids, iris . ENT:  grossly normal hearing, lips & tongue, mmm; appropriate dentition . Neck:  no LAD, masses or thyromegaly .  Cardiovascular:  RRR, no m/r/g. No LE edema.  Marland Kitchen Respiratory:   CTA bilaterally with no wheezes/rales/rhonchi.  Normal respiratory effort. . Abdomen:  soft, NT, ND, NABS . Back:   normal alignment, no CVAT . Skin:  no rash or induration seen on limited exam . Musculoskeletal:  grossly normal tone BUE/BLE, good ROM, no bony abnormality . Psychiatric:  grossly normal mood and affect, speech fluent and appropriate, AOx3 . Neurologic:  CN 2-12 grossly intact, moves all extremities in coordinated fashion, sensation intact    Radiological Exams on Admission: Dg Chest 2 View  Result Date: 08/08/2019 CLINICAL DATA:  Shortness of breath EXAM: CHEST - 2 VIEW COMPARISON:  06/21/2019 FINDINGS: Small left pleural effusion. No focal airspace consolidation or pulmonary edema. No pneumothorax. Normal cardiomediastinal contours. IMPRESSION: Small left pleural effusion Electronically Signed   By: Ulyses Jarred M.D.   On: 08/08/2019 00:49    EKG: Independently reviewed.  NSR with rate 100; nonspecific ST changes with no evidence of acute ischemia   Labs on Admission: I have personally reviewed the available labs and imaging studies at the time of the admission.  Pertinent labs:   CO2 15 Glucose 119 BUN 117/Creatinine 27.56; 50/7.36/9 on 7/27 Hgb 7.3, prior 9-10    Assessment/Plan Principal Problem:   ESRD (end stage renal disease) ([place]) Active Problems:   Uncontrolled hypertension   Stroke ([place])   HLD (hyperlipidemia)   Polysubstance abuse ([facility])   Tobacco abuse   ESRD -Patient with severely uncontrolled HTN despite multiple medications (?compliance) previously admitted 1 month ago for CVA, now returning with progressive symptoms of fatigue, n/v, loss of taste, SOB -COVID-19 infection must be considered based on symptoms and so patient will be PUI pending test results (Aptima test, so results expected in 6-8 hours) -Meanwhile, marked progression of renal failure and strongly suspect ESRD at  this time -Patient with known advanced CKD, rapid worsening since hospitalization 1 month ago -His symptoms appear to all be related to uremia with mild volume overload (assuming COVID negative) -Nephrology has been consulted, anticipate that patient will need initiation of HD -I considered giving IVF to see if there is an acute component that can be corrected, but given the high likelihood of ESRD will defer to nephrology at this time -UA pending -Needs temporary HD catheter for initiation of HD -Will need vascular access mapping for fistula -Will need clipping -Management as per nephrology  Anemia -Likely related to renal disease -May benefit from transfusion during HD but would worsen volume overload if given prior -SOB may be related to this but more likely associated with new ESRD, as above  Uncontrolled HTN -He is taking impressive doses of medications and yet with very poor control in the ER - begging the question of compliance -Continue home meds - Norvasc, Coreg, Hydralazine, Minoxidil -Hold Spironolactone for now, pending nephology input  HLD -Continue Lipitor  H/o CVA -Continue ASA, Plavix  H/o polysubstance abuse -Denies ongoing use, but with ongoing ? Of compliance and LWBS from the ER - will recheck UDS -Patient and his wife understand that he will likely need at least 6 months of sobriety prior to consideration of renal transplant  H/o tobacco dependence -Denies ongoing use since prior hospitalization -He has had a patch prescribed but appears that last use was last week and so this has not been continued at this time   Note: This patient has been tested and is pending for the novel coronavirus COVID-19.  DVT prophylaxis:  Heparin Code Status:  Full - confirmed with patient/family Family Communication: Wife was present throughout evaluation Disposition Plan:  Home once clinically improved Consults called: Nephrology  Admission status: Admit - It is my  clinical opinion that admission to INPATIENT is reasonable and necessary because of the expectation that this patient will require hospital care that crosses at least 2 midnights to treat this  condition based on the medical complexity of the problems presented.  Given the aforementioned information, the predictability of an adverse outcome is felt to be significant.    Karmen Bongo MD [facility]   How to contact the [facility] Attending or Consulting provider [place] or covering provider during after hours [place], for this patient?  1. Check the care team in [facility] and look for a) attending/consulting [facility] provider listed and b) the [facility] team listed 2. Log into [email or web address] and use [place]'s universal password to access. If you do not have the password, please contact the hospital operator. 3. Locate the [facility] provider you are looking for under [facility] and page to a number that you can be directly reached. 4. If you still have difficulty reaching the provider, please page the [facility] (Director on Call) for the Hospitalists listed on amion for assistance.   08/08/2019, 10:29 AM

## 2019-08-08 NOTE — Anesthesia Procedure Notes (Signed)
Procedure Name: LMA Insertion Date/Time: 08/08/2019 5:05 PM Performed by: Renato Shin, CRNA Pre-anesthesia Checklist: Patient identified, Emergency Drugs available, Suction available and Patient being monitored Patient Re-evaluated:Patient Re-evaluated prior to induction Oxygen Delivery Method: Circle system utilized Preoxygenation: Pre-oxygenation with 100% oxygen Induction Type: IV induction LMA: LMA inserted LMA Size: 4.0 Number of attempts: 1 Placement Confirmation: positive ETCO2 and breath sounds checked- equal and bilateral Tube secured with: Tape Dental Injury: Teeth and Oropharynx as per pre-operative assessment

## 2019-08-08 NOTE — Consult Note (Addendum)
Hospital Consult  VASCULAR SURGERY ASSESSMENT & PLAN:   STAGE V CHRONIC KIDNEY DISEASE: He will need to begin dialysis tomorrow morning.  His COVID test is pending.  I will try to place his tunneled dialysis catheter later this afternoon if his COVID results are back.  He ate at 8 AM.  Once he gets on his dialysis schedule we can work on evaluating him for new access next week.  This week schedule is fairly full.  I have discussed the indications for placement of the catheter and the potential complications and he is agreeable to proceed.  Deitra Mayo, MD, [place] [phone] Office: [phone]  Reason for Consult:  In need of [facility] and access Requesting Physician:  Justin Mend MRN #:  [id number]  History of Present Illness: This is a 33 y.o. male who presented to the ER earlier today with c/o shortness of breath that has been progressive, metallic taste in his mouth and fatigue.   He has hx of CKD V and is followed by [facility] since July.  He has hx of HTN.  He had a hypertensive emergency with acute MI in July 2020.    He is in need of urgent dialysis and needs a TDC and permanent access.  Pt is left hand dominant.   The pt is on a statin for cholesterol management.  The pt is on a daily aspirin.   Other AC:  Plavix The pt is on CCB, BB, hydralazine for hypertension.   The pt is not diabetic.   Tobacco hx:  Remote-quit July 2020  Past Medical History:  Diagnosis Date  . Drug abuse, cocaine type ([place]) 06/23/2019  . ESRD (end stage renal disease) ([place]) 08/08/2019  . Hyperlipidemia   . Hypertension   . Stroke [facility]) 06/2019    Past Surgical History:  Procedure Laterality Date  . RADIOLOGY WITH ANESTHESIA N/A 06/23/2019   Procedure: MRI OF VESSELS;  Surgeon: Radiologist, Medication, MD;  Location: [place];  Service: Radiology;  Laterality: N/A;    Allergies  Allergen Reactions  . Reglan [Metoclopramide]     Develops akathisia    Prior to Admission medications   Medication Sig Start  Date End Date Taking? Authorizing Provider  amLODipine (NORVASC) 10 MG tablet Take 1 tablet (10 mg total) by mouth at bedtime. 07/08/19  Yes Debbe Odea, MD  aspirin 81 MG chewable tablet Chew 1 tablet (81 mg total) by mouth daily. 06/30/19  Yes Shelly Coss, MD  atorvastatin (LIPITOR) 40 MG tablet Take 1 tablet (40 mg total) by mouth daily at 6 PM. 06/29/19  Yes Adhikari, Tamsen Meek, MD  carvedilol (COREG) 25 MG tablet Take 1 tablet (25 mg total) by mouth 2 (two) times daily with a meal. 06/29/19  Yes Adhikari, Tamsen Meek, MD  clopidogrel (PLAVIX) 75 MG tablet Take 1 tablet (75 mg total) by mouth daily. 07/09/19  Yes Debbe Odea, MD  hydrALAZINE (APRESOLINE) 100 MG tablet Take 1 tablet (100 mg total) by mouth every 8 (eight) hours. 07/08/19  Yes Debbe Odea, MD  minoxidil (LONITEN) 2.5 MG tablet Take 2.5 mg by mouth 2 (two) times daily. 07/13/19  Yes [provider]  spironolactone (ALDACTONE) 25 MG tablet Take 1 tablet (25 mg total) by mouth daily. 07/09/19  Yes Debbe Odea, MD    Social History   Socioeconomic History  . Marital status: Married    Spouse name: Not on file  . Number of children: Not on file  . Years of education: Not on file  .  Highest education level: Not on file  Occupational History  . Occupation: Secretary/administrator - laid off  Social Needs  . Financial resource strain: Not on file  . Food insecurity    Worry: Not on file    Inability: Not on file  . Transportation needs    Medical: Not on file    Non-medical: Not on file  Tobacco Use  . Smoking status: Former Smoker    Packs/day: 1.00    Years: 20.00    Pack years: 20.00    Quit date: 06/2019    Years since quitting: 0.1  . Smokeless tobacco: Never Used  Substance and Sexual Activity  . Alcohol use: Not Currently    Comment: 2-3 beers a day; stopped with 7/20 hospitalization  . Drug use: Yes    Types: Marijuana, Cocaine, Benzodiazepines    Comment: denies use since 7/20 hospitalization  . Sexual activity:  Not on file  Lifestyle  . Physical activity    Days per week: Not on file    Minutes per session: Not on file  . Stress: Not on file  Relationships  . Social Herbalist on phone: Not on file    Gets together: Not on file    Attends religious service: Not on file    Active member of club or organization: Not on file    Attends meetings of clubs or organizations: Not on file    Relationship status: Not on file  . Intimate partner violence    Fear of current or ex partner: Not on file    Emotionally abused: Not on file    Physically abused: Not on file    Forced sexual activity: Not on file  Other Topics Concern  . Not on file  Social History Narrative  . Not on file     Family History  Problem Relation Age of Onset  . Diabetes Sister   . Diabetes Maternal Uncle   . Renal Disease Neg Hx     ROS: [x]  Positive   [ ]  Negative   [ ]  All sytems reviewed and are negative  Cardiac: []  chest pain/pressure []  high blood pressure/high lipids  [x]  SOB [x]  DOE  Vascular: []  pain in legs while walking []  pain in legs at rest []  pain in legs at night []  non-healing ulcers []  hx of DVT []  swelling in legs  Pulmonary: []  productive cough []  asthma/wheezing []  home O2  Neurologic: [x]  hx of CVA []  mini stroke  Hematologic: []  hx of cancer []  bleeding problems []  problems with blood clotting easily  Endocrine:   []  diabetes []  thyroid disease  GI []  vomiting blood []  blood in stool  GU: [x]  CKD/renal failure [x]  HD--[]  M/W/F or []  T/T/S  Psychiatric: []  anxiety []  depression  Musculoskeletal: []  arthritis []  joint pain  Integumentary: []  rashes []  ulcers  Constitutional: []  fever []  chills   Physical Examination  Vitals:   08/08/19 1030 08/08/19 1045  BP: (!) 147/91 (!) 152/85  Pulse: 85 90  Resp: (!) 28 (!) 24  Temp:    SpO2: 96% 98%   There is no height or weight on file to calculate BMI.  General:  WDWN in NAD Gait: Not  observed HENT: WNL, normocephalic Pulmonary: normal non-labored breathing with decreased breath sounds at bases bilaterally Cardiac: regular, without  Murmurs, rubs or gallops; without carotid bruits Abdomen:  soft, NT/ND, no masses Skin: without rashes Vascular Exam/Pulses:  Right Left  Radial 2+ (normal)  2+ (normal)  Ulnar 2+ (normal) 2+ (normal)  Brachial 2+ (normal) 2+ (normal)  DP 2+ (normal) 2+ (normal)  PT 2+ (normal) 2+ (normal)   Extremities: without ischemic changes, without Gangrene , without cellulitis; without open wounds; IV present in the right forearm Musculoskeletal: no muscle wasting or atrophy  Neurologic: A&O X 3;  No focal weakness or paresthesias are detected; speech is fluent/normal Psychiatric:  The pt has Normal affect.   CBC    Component Value Date/Time   WBC 7.7 08/08/2019 0023   RBC 2.35 (L) 08/08/2019 0023   HGB 7.3 (L) 08/08/2019 0023   HCT 22.3 (L) 08/08/2019 0023   PLT 301 08/08/2019 0023   MCV 94.9 08/08/2019 0023   MCH 31.1 08/08/2019 0023   MCHC 32.7 08/08/2019 0023   RDW 12.8 08/08/2019 0023   LYMPHSABS 1.8 08/08/2019 0023   MONOABS 0.7 08/08/2019 0023   EOSABS 0.2 08/08/2019 0023   BASOSABS 0.0 08/08/2019 0023    BMET    Component Value Date/Time   NA 135 08/08/2019 0023   K 4.0 08/08/2019 0023   CL 99 08/08/2019 0023   CO2 15 (L) 08/08/2019 0023   GLUCOSE 119 (H) 08/08/2019 0023   BUN 117 (H) 08/08/2019 0023   CREATININE 27.56 (H) 08/08/2019 0023   CALCIUM 9.8 08/08/2019 0023   GFRNONAA NOT CALCULATED 08/08/2019 0023   GFRAA NOT CALCULATED 08/08/2019 0023    COAGS: No results found for: INR, PROTIME   Non-Invasive Vascular Imaging:   Will need vein mapping    ASSESSMENT/PLAN: This is a 33 y.o. male with acute on CKD V in need of a tunneled dialysis catheter and permanent dialysis access. Pt is left hand dominant.  -covid test is pending and results take 6-12 hours and it was taken at 0940 this am. Pt last ate at  0800.   Keep pt npo as we may be able to place catheter this afternoon in OR.   -will need to move IV from right forearm since he is left hand dominant and will need vein mapping (just ordered)    Leontine Locket, PA-C [facility] [phone]

## 2019-08-08 NOTE — ED Triage Notes (Signed)
Patient reports SOB with occasional dry cough onset this week . Denies fever or chills .

## 2019-08-08 NOTE — Consult Note (Signed)
Referring Provider: No ref. provider found Primary Care Physician:  Patient, No Pcp Per Primary Nephrologist:  Dr.   Luiz Iron for Consultation: Chronic kidney disease stage V, maintenance of euvolemia, metabolic acidosis, hypertension.  HPI: This very pleasant 33 year old gentleman with history of hypertension chronic disease stage V followed at [facility].  He has a history of hypertensive emergency and was admitted with acute CVA and press syndrome and July 2020.  Over the fast few days has been complaining of progressive dyspnea metallic taste in his mouth extreme fatigue and weakness.  He was seen in the emergency room with a creatinine of 27 and BUN of 117.   Blood pressure 150/85 pulse 90 temperature 98.5 O2 sats 98% room air  Sodium 135 potassium 4.0 chloride 99 CO2 15 BUN 117 creatinine 27.56 glucose 119 calcium 9.8 WBC 7.7 hemoglobin 7.3 platelets 301  Medications amlodipine 10 mg daily, aspirin 81 mg daily Lipitor 40 mg Zocor 25 mg twice daily Plavix 75 mg daily, hydralazine 100 mg 3 times daily, minoxidil 2.5 mg twice daily   Past Medical History:  Diagnosis Date  . Drug abuse, cocaine type ([place]) 06/23/2019  . ESRD (end stage renal disease) ([place]) 08/08/2019  . Hyperlipidemia   . Hypertension   . Stroke [facility]) 06/2019    Past Surgical History:  Procedure Laterality Date  . RADIOLOGY WITH ANESTHESIA N/A 06/23/2019   Procedure: MRI OF VESSELS;  Surgeon: Radiologist, Medication, MD;  Location: [place];  Service: Radiology;  Laterality: N/A;    Prior to Admission medications   Medication Sig Start Date End Date Taking? Authorizing Provider  amLODipine (NORVASC) 10 MG tablet Take 1 tablet (10 mg total) by mouth at bedtime. 07/08/19  Yes Debbe Odea, MD  aspirin 81 MG chewable tablet Chew 1 tablet (81 mg total) by mouth daily. 06/30/19  Yes Shelly Coss, MD  atorvastatin (LIPITOR) 40 MG tablet Take 1 tablet (40 mg total) by mouth daily at 6 PM. 06/29/19  Yes  Adhikari, Tamsen Meek, MD  carvedilol (COREG) 25 MG tablet Take 1 tablet (25 mg total) by mouth 2 (two) times daily with a meal. 06/29/19  Yes Adhikari, Tamsen Meek, MD  clopidogrel (PLAVIX) 75 MG tablet Take 1 tablet (75 mg total) by mouth daily. 07/09/19  Yes Debbe Odea, MD  hydrALAZINE (APRESOLINE) 100 MG tablet Take 1 tablet (100 mg total) by mouth every 8 (eight) hours. 07/08/19  Yes Debbe Odea, MD  minoxidil (LONITEN) 2.5 MG tablet Take 2.5 mg by mouth 2 (two) times daily. 07/13/19  Yes [provider]  spironolactone (ALDACTONE) 25 MG tablet Take 1 tablet (25 mg total) by mouth daily. 07/09/19  Yes Debbe Odea, MD    Current Facility-Administered Medications  Medication Dose Route Frequency Provider Last Rate Last Dose  . acetaminophen (TYLENOL) tablet 650 mg  650 mg Oral Q6H PRN Karmen Bongo, MD   650 mg at 08/08/19 1043   Or  . acetaminophen (TYLENOL) suppository 650 mg  650 mg Rectal Q6H PRN Karmen Bongo, MD      . amLODipine (NORVASC) tablet 10 mg  10 mg Oral Ivery Quale, MD      . aspirin chewable tablet 81 mg  81 mg Oral Daily Karmen Bongo, MD   81 mg at 08/08/19 1043  . atorvastatin (LIPITOR) tablet 40 mg  40 mg Oral q1800 Karmen Bongo, MD      . calcium carbonate (dosed in mg elemental calcium) suspension 500 mg of elemental calcium  500 mg of elemental  calcium Oral Q6H PRN Karmen Bongo, MD      . camphor-menthol [facility]) lotion 1 application  1 application Topical [id number] PRN Karmen Bongo, MD       And  . hydrOXYzine (ATARAX/VISTARIL) tablet 25 mg  25 mg Oral Q8H PRN Karmen Bongo, MD      . carvedilol (COREG) tablet 25 mg  25 mg Oral BID WC Karmen Bongo, MD      . clopidogrel (PLAVIX) tablet 75 mg  75 mg Oral Daily Karmen Bongo, MD   75 mg at 08/08/19 1043  . docusate sodium (COLACE) capsule 100 mg  100 mg Oral BID Karmen Bongo, MD      . docusate sodium [facility]) enema 283 mg  1 enema Rectal PRN Karmen Bongo, MD      . feeding supplement  (NEPRO CARB STEADY) liquid 237 mL  237 mL Oral TID PRN Karmen Bongo, MD      . heparin injection 5,000 Units  5,000 Units Subcutaneous Camelia Phenes Karmen Bongo, MD      . hydrALAZINE (APRESOLINE) tablet 100 mg  100 mg Oral Lynne Logan, MD      . minoxidil (LONITEN) tablet 2.5 mg  2.5 mg Oral BID Karmen Bongo, MD      . ondansetron [facility]) tablet 4 mg  4 mg Oral Q6H PRN Karmen Bongo, MD       Or  . ondansetron [facility]) injection 4 mg  4 mg Intravenous Q6H PRN Karmen Bongo, MD      . sorbitol 70 % solution 30 mL  30 mL Oral PRN Karmen Bongo, MD      . zolpidem Lorrin Mais) tablet 5 mg  5 mg Oral QHS PRN Karmen Bongo, MD       Current Outpatient Medications  Medication Sig Dispense Refill  . amLODipine (NORVASC) 10 MG tablet Take 1 tablet (10 mg total) by mouth at bedtime. 30 tablet 0  . aspirin 81 MG chewable tablet Chew 1 tablet (81 mg total) by mouth daily.    Marland Kitchen atorvastatin (LIPITOR) 40 MG tablet Take 1 tablet (40 mg total) by mouth daily at 6 PM.    . carvedilol (COREG) 25 MG tablet Take 1 tablet (25 mg total) by mouth 2 (two) times daily with a meal.    . clopidogrel (PLAVIX) 75 MG tablet Take 1 tablet (75 mg total) by mouth daily. 4 tablet 0  . hydrALAZINE (APRESOLINE) 100 MG tablet Take 1 tablet (100 mg total) by mouth every 8 (eight) hours. 30 tablet 3  . minoxidil (LONITEN) 2.5 MG tablet Take 2.5 mg by mouth 2 (two) times daily.    Marland Kitchen spironolactone (ALDACTONE) 25 MG tablet Take 1 tablet (25 mg total) by mouth daily. 30 tablet 3    Allergies as of 08/08/2019 - Review Complete 08/08/2019  Allergen Reaction Noted  . Reglan [metoclopramide]  06/22/2019    Family History  Problem Relation Age of Onset  . Diabetes Sister   . Diabetes Maternal Uncle   . Renal Disease Neg Hx     Social History   Socioeconomic History  . Marital status: Married    Spouse name: Not on file  . Number of children: Not on file  . Years of education: Not on file  . Highest  education level: Not on file  Occupational History  . Occupation: Secretary/administrator - laid off  Social Needs  . Financial resource strain: Not on file  . Food insecurity    Worry: Not on file  Inability: Not on file  . Transportation needs    Medical: Not on file    Non-medical: Not on file  Tobacco Use  . Smoking status: Former Smoker    Packs/day: 1.00    Years: 20.00    Pack years: 20.00    Quit date: 06/2019    Years since quitting: 0.1  . Smokeless tobacco: Never Used  Substance and Sexual Activity  . Alcohol use: Not Currently    Comment: 2-3 beers a day; stopped with 7/20 hospitalization  . Drug use: Yes    Types: Marijuana, Cocaine, Benzodiazepines    Comment: denies use since 7/20 hospitalization  . Sexual activity: Not on file  Lifestyle  . Physical activity    Days per week: Not on file    Minutes per session: Not on file  . Stress: Not on file  Relationships  . Social Herbalist on phone: Not on file    Gets together: Not on file    Attends religious service: Not on file    Active member of club or organization: Not on file    Attends meetings of clubs or organizations: Not on file    Relationship status: Not on file  . Intimate partner violence    Fear of current or ex partner: Not on file    Emotionally abused: Not on file    Physically abused: Not on file    Forced sexual activity: Not on file  Other Topics Concern  . Not on file  Social History Narrative  . Not on file    Review of Systems: Gen: Admits to anorexia fatigue and weakness malaise HEENT: No visual complaints, No history of Retinopathy. Normal external appearance No Epistaxis or Sore throat. No sinusitis.   CV: Denies chest pain, angina, palpitations, syncope,     peripheral edema, and claudication. Resp: Dyspnea on exertion no hemoptysis no sputum no fever chills GI: Complains of nausea loss of appetite and vomiting x1 week GU : Denies urinary burning, blood in urine, urinary  frequency, urinary hesitancy, nocturnal urination, and urinary incontinence.  No renal calculi. MS: Denies joint pain, limitation of movement, and swelling, stiffness, low back pain, extremity pain. Denies muscle weakness, cramps, atrophy.  No use of non steroidal antiinflammatory drugs. Derm: Denies rash, itching, dry skin, hives, moles, warts, or unhealing ulcers.  Psych: Denies depression, anxiety, memory loss, suicidal ideation, hallucinations, paranoia, and confusion. Heme: Denies bruising, bleeding, and enlarged lymph nodes.  Anemia Neuro: No headache.  No diplopia. No dysarthria.  No dysphasia.  No history of CVA.  No Seizures. No paresthesias.  No weakness.  History of press Endocrine No DM.  No Thyroid disease.  No Adrenal disease.  Physical Exam: Vital signs in last 24 hours: Temp:  [98.2 F (36.8 C)-98.6 F (37 C)] 98.5 F (36.9 C) (08/25 0533) Pulse Rate:  [77-103] 90 (08/25 1045) Resp:  [20-28] 24 (08/25 1045) BP: (142-187)/(82-102) 152/85 (08/25 1045) SpO2:  [96 %-99 %] 98 % (08/25 1045)   General:   Alert,  Well-developed, well-nourished, pleasant and cooperative in NAD appears unwell Head:  Normocephalic and atraumatic. Eyes:  Sclera clear, no icterus.  Conjunctival paleness Ears:  Normal auditory acuity. Nose:  No deformity, discharge,  or lesions. Mouth:  No deformity or lesions, dentition normal. Neck:  Supple; no masses or thyromegaly. JVP not elevated Lungs:  Clear throughout to auscultation.   No wheezes, crackles, or rhonchi. No acute distress. Heart:  Regular rate and rhythm;  no murmurs, clicks, rubs,  or gallops. Abdomen:  Soft, nontender and nondistended. No masses, hepatosplenomegaly or hernias noted. Normal bowel sounds, without guarding, and without rebound.   Msk:  Symmetrical without gross deformities. Normal posture. Pulses:  No carotid, renal, femoral bruits. DP and PT symmetrical and equal Extremities:  Without clubbing or edema. Neurologic:  Alert  and  oriented x4;  grossly normal neurologically. Skin:  Intact without significant lesions or rashes. Cervical Nodes:  No significant cervical adenopathy. Psych:  Alert and cooperative. Normal mood and affect.  Intake/Output from previous day: No intake/output data recorded. Intake/Output this shift: No intake/output data recorded.  Lab Results: Recent Labs    08/08/19 0023  WBC 7.7  HGB 7.3*  HCT 22.3*  PLT 301   BMET Recent Labs    08/08/19 0023 08/08/19 0942  NA 135  --   K 4.0  --   CL 99  --   CO2 15*  --   GLUCOSE 119*  --   BUN 117*  --   CREATININE 27.56*  --   CALCIUM 9.8  --   PHOS  --  7.7*   LFT No results for input(s): PROT, ALBUMIN, AST, ALT, ALKPHOS, BILITOT, BILIDIR, IBILI in the last 72 hours. PT/INR No results for input(s): LABPROT, INR in the last 72 hours. Hepatitis Panel No results for input(s): HEPBSAG, HCVAB, HEPAIGM, HEPBIGM in the last 72 hours.  Studies/Results: Dg Chest 2 View  Result Date: 08/08/2019 CLINICAL DATA:  Shortness of breath EXAM: CHEST - 2 VIEW COMPARISON:  06/21/2019 FINDINGS: Small left pleural effusion. No focal airspace consolidation or pulmonary edema. No pneumothorax. Normal cardiomediastinal contours. IMPRESSION: Small left pleural effusion Electronically Signed   By: Ulyses Jarred M.D.   On: 08/08/2019 00:49    Assessment/Plan:  CKD stage V creatinine appears to have progressed since his admission in July 2020 at that time it was thought that he possibly had severe acute kidney injury his creatinine had been 1.1 in January 2019 he was scheduled for follow-up with [facility].  A work-up for GN has been unrevealing with negative serologies.  He does have a history of positive cocaine use.  Ultrasound of his kidneys revealed no evidence of hydronephrosis in June 23, 2019.  His kidney size was 10.9 cm on the right 10.2 cm on the left with increased echogenicity.  His urinalysis was bland.  His protein  creatinine ratio elevated 1.47   07/03/2019.  I believe this represents end-stage renal disease in a patient with uncontrolled hypertension history of cocaine use.  Does not appear to be any reversibility to this process as his renal function has continued to decline over the past 6 weeks  Hypertension severe uncontrolled hypertension most likely etiology of progression to end-stage renal disease.  Bones will check PTH calcium and phosphorus.  Initiate binders  Anemia check iron stores initiate with ESA  Access we will consult with vascular surgery in order to place catheter and fistula.  Will initiate hemodialysis once catheter in place   LOS: [place] @TODAY @11 :12 AM

## 2019-08-08 NOTE — ED Notes (Signed)
ED TO INPATIENT HANDOFF REPORT  ED Nurse Name and Phone #: Kathlee Nations [phone]  S Name/Age/Gender William [name] 33 y.o. male Room/Bed: 031C/031C  Code Status   Code Status: Full Code  Home/SNF/Other Home Patient oriented to: self, place, time and situation Is this baseline? Yes   Triage Complete: Triage complete  Chief Complaint shob  Triage Note Patient reports SOB with occasional dry cough onset this week . Denies fever or chills .    Allergies Allergies  Allergen Reactions  . Reglan [Metoclopramide]     Develops akathisia    Level of Care/Admitting Diagnosis ED Disposition    ED Disposition Condition Comment   Admit  Hospital Area: [place] [[id number]]  Level of Care: Med-Surg [16]  Covid Evaluation: Person Under Investigation (PUI)  Diagnosis: ESRD (end stage renal disease) ([place]) [id number]  Admitting Physician: Karmen Bongo [[id number]]  Attending Physician: Karmen Bongo [[id number]]  Estimated length of stay: 3 - 4 days  Certification:: I certify this patient will need inpatient services for at least 2 midnights  Bed request comments: may move to renal floor if COVID negative  PT Class (Do Not Modify): Inpatient [101]  PT Acc Code (Do Not Modify): Private [1]       B Medical/Surgery History Past Medical History:  Diagnosis Date  . Drug abuse, cocaine type ([place]) 06/23/2019  . ESRD (end stage renal disease) ([place]) 08/08/2019  . Hyperlipidemia   . Hypertension   . Stroke [facility]) 06/2019   Past Surgical History:  Procedure Laterality Date  . RADIOLOGY WITH ANESTHESIA N/A 06/23/2019   Procedure: MRI OF VESSELS;  Surgeon: Radiologist, Medication, MD;  Location: [place];  Service: Radiology;  Laterality: N/A;     A IV Location/Drains/Wounds Patient Lines/Drains/Airways Status   Active Line/Drains/Airways    Name:   Placement date:   Placement time:   Site:   Days:   Peripheral IV 08/08/19 Right Antecubital   08/08/19    0913    Antecubital   less  than 1          Intake/Output Last 24 hours No intake or output data in the 24 hours ending 08/08/19 1032  Labs/Imaging Results for orders placed or performed during the hospital encounter of 08/08/19 (from the past 48 hour(s))  CBC with Differential     Status: Abnormal   Collection Time: 08/08/19 12:23 AM  Result Value Ref Range   WBC 7.7 4.0 - 10.5 K/uL   RBC 2.35 (L) 4.22 - 5.81 MIL/uL   Hemoglobin 7.3 (L) 13.0 - 17.0 g/dL   HCT 22.3 (L) 39.0 - 52.0 %   MCV 94.9 80.0 - 100.0 fL   MCH 31.1 26.0 - 34.0 pg   MCHC 32.7 30.0 - 36.0 g/dL   RDW 12.8 11.5 - 15.5 %   Platelets 301 150 - 400 K/uL   nRBC 0.0 0.0 - 0.2 %   Neutrophils Relative % 63 %   Neutro Abs 4.9 1.7 - 7.7 K/uL   Lymphocytes Relative 23 %   Lymphs Abs 1.8 0.7 - 4.0 K/uL   Monocytes Relative 9 %   Monocytes Absolute 0.7 0.1 - 1.0 K/uL   Eosinophils Relative 3 %   Eosinophils Absolute 0.2 0.0 - 0.5 K/uL   Basophils Relative 1 %   Basophils Absolute 0.0 0.0 - 0.1 K/uL   Immature Granulocytes 1 %   Abs Immature Granulocytes 0.08 (H) 0.00 - 0.07 K/uL    Comment: Performed at [facility], [address] [place], [place] [id number]  Basic metabolic panel     Status: Abnormal   Collection Time: 08/08/19 12:23 AM  Result Value Ref Range   Sodium 135 135 - 145 mmol/L   Potassium 4.0 3.5 - 5.1 mmol/L   Chloride 99 98 - 111 mmol/L   CO2 15 (L) 22 - 32 mmol/L   Glucose, Bld 119 (H) 70 - 99 mg/dL   BUN 117 (H) 6 - 20 mg/dL   Creatinine, Ser 27.56 (H) 0.61 - 1.24 mg/dL    Comment: RESULTS CONFIRMED BY MANUAL DILUTION   Calcium 9.8 8.9 - 10.3 mg/dL   GFR calc non Af Amer NOT CALCULATED >60 mL/min   GFR calc Af Amer NOT CALCULATED >60 mL/min   Anion gap 21 (H) 5 - 15    Comment: RESULT CHECKED Performed at [facility], [address], [place], [place] [address]    Dg Chest 2 View  Result Date: 08/08/2019 CLINICAL DATA:  Shortness of breath EXAM: CHEST - 2 VIEW COMPARISON:  06/21/2019 FINDINGS: Small  left pleural effusion. No focal airspace consolidation or pulmonary edema. No pneumothorax. Normal cardiomediastinal contours. IMPRESSION: Small left pleural effusion Electronically Signed   By: Ulyses Jarred M.D.   On: 08/08/2019 00:49    Pending Labs Unresulted Labs (From admission, onward)    Start     Ordered   08/09/19 [id number]  Basic metabolic panel  Tomorrow morning,   R     08/08/19 1025   08/09/19 0500  CBC  Tomorrow morning,   R     08/08/19 1025   08/08/19 0925  Urinalysis, Routine w reflex microscopic  Once,   STAT     08/08/19 0924   08/08/19 0925  Rapid urine drug screen (hospital performed)  ONCE - STAT,   STAT     08/08/19 0924   08/08/19 0923  Magnesium  ONCE - STAT,   STAT     08/08/19 0923   08/08/19 0923  Phosphorus  ONCE - STAT,   STAT     08/08/19 0923   08/08/19 0923  Type and screen [place]  Once,   STAT    Comments: [place]    08/08/19 [phone]   08/08/19 0858  SARS CORONAVIRUS 2 (TAT 6-12 HRS) Nasal Swab Aptima Multi Swab  (Asymptomatic/Tier 2 Patients Labs)  Once,   STAT    Question Answer Comment  Is this test for diagnosis or screening Screening   Symptomatic for COVID-19 as defined by CDC No   Hospitalized for COVID-19 No   Admitted to ICU for COVID-19 No   Previously tested for COVID-19 Yes   Resident in a congregate (group) care setting No   Employed in healthcare setting No      08/08/19 0857          Vitals/Pain Today's Vitals   08/08/19 0930 08/08/19 0945 08/08/19 1000 08/08/19 1015  BP: (!) 143/86 (!) 143/82 (!) 142/86 (!) 143/87  Pulse: 88 87 87 85  Resp: (!) 21 (!) 23 (!) 24 (!) 22  Temp:      TempSrc:      SpO2: 97% 98% 96% 97%  PainSc:        Isolation Precautions Airborne and Contact precautions  Medications Medications  aspirin chewable tablet 81 mg (has no administration in time range)  amLODipine (NORVASC) tablet 10 mg (has no administration in time range)  atorvastatin (LIPITOR)  tablet 40 mg (has  no administration in time range)  carvedilol (COREG) tablet 25 mg (has no administration in time range)  hydrALAZINE (APRESOLINE) tablet 100 mg (has no administration in time range)  minoxidil (LONITEN) tablet 2.5 mg (has no administration in time range)  clopidogrel (PLAVIX) tablet 75 mg (has no administration in time range)  acetaminophen (TYLENOL) tablet 650 mg (has no administration in time range)    Or  acetaminophen (TYLENOL) suppository 650 mg (has no administration in time range)  docusate sodium (COLACE) capsule 100 mg (has no administration in time range)  ondansetron (ZOFRAN) tablet 4 mg (has no administration in time range)    Or  ondansetron (ZOFRAN) injection 4 mg (has no administration in time range)  zolpidem (AMBIEN) tablet 5 mg (has no administration in time range)  sorbitol 70 % solution 30 mL (has no administration in time range)  docusate sodium (ENEMEEZ) enema 283 mg (has no administration in time range)  camphor-menthol (SARNA) lotion 1 application (has no administration in time range)    And  hydrOXYzine (ATARAX/VISTARIL) tablet 25 mg (has no administration in time range)  calcium carbonate (dosed in mg elemental calcium) suspension 500 mg of elemental calcium (has no administration in time range)  feeding supplement (NEPRO CARB STEADY) liquid 237 mL (has no administration in time range)  heparin injection 5,000 Units (has no administration in time range)    Mobility walks Low fall risk   Focused Assessments Renal Assessment Handoff:  Hemodialysis Schedule: Hemodialysis Schedule: Monday/Wednesday/Friday Last Hemodialysis date and time: TBA   Restricted appendage: right Not established yet     R Recommendations: See Admitting Provider Note  Report given to:   Additional Notes: Pt needing to be put on HD

## 2019-08-08 NOTE — Transfer of Care (Signed)
Immediate Anesthesia Transfer of Care Note  Patient: William [name]  Procedure(s) Performed: INSERTION OF right internal jugular DIALYSIS CATHETER (Right Neck)  Patient Location: PACU  Anesthesia Type:General  Level of Consciousness: awake, alert  and oriented  Airway & Oxygen Therapy: Patient Spontanous Breathing and Patient connected to nasal cannula oxygen  Post-op Assessment: Report given to RN and Post -op Vital signs reviewed and stable  Post vital signs: Reviewed and stable  Last Vitals:  Vitals Value Taken Time  BP 155/100 08/08/19 1824  Temp 36.2 C 08/08/19 1740  Pulse 84 08/08/19 1825  Resp 19 08/08/19 1825  SpO2 96 % 08/08/19 1825  Vitals shown include unvalidated device data.  Last Pain:  Vitals:   08/08/19 1809  TempSrc:   PainSc: 0-No pain         Complications: No apparent anesthesia complications

## 2019-08-08 NOTE — Op Note (Signed)
08/08/2019  PREOP DIAGNOSIS: Chronic kidney disease  POSTOP DIAGNOSIS: Chronic kidney disease  PROCEDURE: Ultrasound guided placement of right IJ tunneled dialysis catheter  (23 cm)  SURGEON: Judeth Cornfield. Scot Dock, MD, FACS  ASSIST: none  ANESTHESIA: local with sedation   EBL: minimal  FINDINGS: patent right IJ  INDICATIONS: for HD  TECHNIQUE: The patient was taken to the operating room and sedated by anesthesia. The neck and upper chest were prepped and draped in the usual sterile fashion. After the skin was anesthetized with 1% lidocaine, and under ultrasound guidance, the right IJ was cannulated and a guidewire introduced into the superior vena cava under fluoroscopic control. The tract over the wire was dilated and then the dilator and peel-away sheath were passed over the wire and the wire and dilator removed. The catheter was passed through the peel-away sheath and positioned in the right atrium. The exit site for the catheter was selected and the skin anesthetized between the 2 areas. The catheter was then brought through the tunnel, cut to the appropriate length, and the distal ports were attached. Both ports withdrew easily, were then flushed with heparinized saline and filled with concentrated heparin. The catheter was secured at its exit site with a 3-0 nylon suture. The IJ cannulation site was closed with a 4-0 subcuticular stitch. A sterile dressing was applied. The patient tolerated the procedure well and was transferred to the recovery room in stable condition. All needle and sponge counts were correct.  Deitra Mayo, MD, FACS [facility]: 08/08/2019 DATE OF DICTATION: 08/08/2019

## 2019-08-08 NOTE — ED Notes (Signed)
Called pt x2 for vitals, no response. °

## 2019-08-08 NOTE — ED Provider Notes (Signed)
[facility]PART[facility] Provider Note   CSN: [id number] Arrival date & time: 08/08/19  0002     History   Chief Complaint Chief Complaint  Patient presents with  . Shortness of Breath    HPI William [name] is a 33 y.o. male.     HPI   32yM with dyspnea. Past hx of HTN, CKD, substance abuse. Recent hx significant for admission last month for hypertensive urgency, acute CVA, PRESS, hyponatremia, AKI and elevated troponin felt to be from demand ischemia.  Over the past several days he has had progressive dyspnea. Fatigue. Cold intolerance. Food doesn't taste good. Came to ED yesterday but LWBS because of wait. Labs showing significantly worsening renal function with Cr 27, BUN 117, bicarb 15. He was called and advised to return to the ER.  He says he has been going to PT/OT since discharge but has yet to see nephrology or PCP. He reports compliance with his medications and avoidance of drugs. He doesn't feel like he is having any new swelling. He feels like he has been urinating normally.   Past Medical History:  Diagnosis Date  . Drug abuse, cocaine type ([place]) 06/23/2019  . Hyperlipidemia   . Hypertension   . Stroke [facility])     Patient Active Problem List   Diagnosis Date Noted  . Anemia of chronic disease 07/08/2019  . AKI (acute kidney injury) ([place]) 07/08/2019  . Stroke ([place]) 07/07/2019  . HLD (hyperlipidemia) 07/07/2019  . Polysubstance abuse ([place]) 07/07/2019  . Tobacco abuse 07/07/2019  . Uncontrolled hypertension   . Acute kidney injury ([place])   . PRES (posterior reversible encephalopathy syndrome) 06/29/2019  . 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 Hypertensive emergency 06/22/2019  . Acute renal failure [facility])     Past Surgical History:  Procedure Laterality Date  . RADIOLOGY WITH ANESTHESIA N/A 06/23/2019   Procedure: MRI OF VESSELS;  Surgeon: Radiologist, Medication, MD;  Location: [facility];  Service: Radiology;   Laterality: N/A;        Home Medications    Prior to Admission medications   Medication Sig Start Date End Date Taking? Authorizing Provider  amLODipine (NORVASC) 10 MG tablet Take 1 tablet (10 mg total) by mouth at bedtime. 07/08/19  Yes Debbe Odea, MD  aspirin 81 MG chewable tablet Chew 1 tablet (81 mg total) by mouth daily. 06/30/19  Yes Shelly Coss, MD  atorvastatin (LIPITOR) 40 MG tablet Take 1 tablet (40 mg total) by mouth daily at 6 PM. 06/29/19  Yes Adhikari, Tamsen Meek, MD  carvedilol (COREG) 25 MG tablet Take 1 tablet (25 mg total) by mouth 2 (two) times daily with a meal. 06/29/19  Yes Adhikari, Tamsen Meek, MD  clopidogrel (PLAVIX) 75 MG tablet Take 1 tablet (75 mg total) by mouth daily. 07/09/19  Yes Debbe Odea, MD  hydrALAZINE (APRESOLINE) 100 MG tablet Take 1 tablet (100 mg total) by mouth every 8 (eight) hours. 07/08/19  Yes Debbe Odea, MD  minoxidil (LONITEN) 2.5 MG tablet Take 2.5 mg by mouth 2 (two) times daily. 07/13/19  Yes [provider]  nicotine (NICODERM CQ - DOSED IN MG/24 HR) 7 mg/24hr patch Place 1 patch (7 mg total) onto the skin daily. Patient taking differently: Place 3 mg onto the skin daily.  07/08/19  Yes Debbe Odea, MD  spironolactone (ALDACTONE) 25 MG tablet Take 1 tablet (25 mg total) by mouth daily. 07/09/19  Yes Debbe Odea, MD    Family History Family History  Problem  Relation Age of Onset  . Diabetes Sister   . Diabetes Maternal Uncle     Social History Social History   Tobacco Use  . Smoking status: Current Every Day Smoker    Packs/day: 1.00  . Smokeless tobacco: Never Used  Substance Use Topics  . Alcohol use: Yes    Comment: 2-3 beers a day  . Drug use: Yes    Types: Marijuana, Cocaine, Benzodiazepines     Allergies   Reglan [metoclopramide]   Review of Systems Review of Systems  All systems reviewed and negative, other than as noted in HPI.  Physical Exam Updated Vital Signs BP (!) 149/94   Pulse 89   Temp  98.5 F (36.9 C) (Oral)   Resp (!) 25   SpO2 97%   Physical Exam Vitals signs and nursing note reviewed.  Constitutional:      General: He is not in acute distress.    Appearance: He is well-developed.  HENT:     Head: Normocephalic and atraumatic.  Eyes:     General:        Right eye: No discharge.        Left eye: No discharge.     Conjunctiva/sclera: Conjunctivae normal.  Neck:     Musculoskeletal: Neck supple.  Cardiovascular:     Rate and Rhythm: Normal rate and regular rhythm.     Heart sounds: Normal heart sounds. No murmur. No friction rub. No gallop.   Pulmonary:     Effort: Pulmonary effort is normal. No respiratory distress.     Breath sounds: Normal breath sounds.  Abdominal:     General: There is no distension.     Palpations: Abdomen is soft.     Tenderness: There is no abdominal tenderness.  Musculoskeletal:        General: No tenderness.  Skin:    General: Skin is warm and dry.  Neurological:     Mental Status: He is alert.  Psychiatric:        Behavior: Behavior normal.        Thought Content: Thought content normal.      ED Treatments / Results  Labs (all labs ordered are listed, but only abnormal results are displayed) Labs Reviewed  CBC WITH DIFFERENTIAL/PLATELET - Abnormal; Notable for the following components:      Result Value   RBC 2.35 (*)    Hemoglobin 7.3 (*)    HCT 22.3 (*)    Abs Immature Granulocytes 0.08 (*)    All other components within normal limits  BASIC METABOLIC PANEL - Abnormal; Notable for the following components:   CO2 15 (*)    Glucose, Bld 119 (*)    BUN 117 (*)    Creatinine, Ser 27.56 (*)    Anion gap 21 (*)    All other components within normal limits  SARS CORONAVIRUS 2 (TAT 6-12 HRS)  MAGNESIUM  PHOSPHORUS  URINALYSIS, ROUTINE W REFLEX MICROSCOPIC  RAPID URINE DRUG SCREEN, HOSP PERFORMED  TYPE AND SCREEN    EKG EKG Interpretation  Date/Time:  Tuesday August 08 2019 00:08:43 EDT Ventricular Rate:   100 PR Interval:  152 QRS Duration: 82 QT Interval:  342 QTC Calculation: 441 R Axis:   99 Text Interpretation:  Normal sinus rhythm Right atrial enlargement Rightward axis Anterior infarct , age undetermined Abnormal ECG No significant change since last tracing Confirmed by Virgel Manifold [phone]) on 08/08/2019 9:25:52 AM   Radiology Dg Chest 2 View  Result Date: 08/08/2019  CLINICAL DATA:  Shortness of breath EXAM: CHEST - 2 VIEW COMPARISON:  06/21/2019 FINDINGS: Small left pleural effusion. No focal airspace consolidation or pulmonary edema. No pneumothorax. Normal cardiomediastinal contours. IMPRESSION: Small left pleural effusion Electronically Signed   By: Ulyses Jarred M.D.   On: 08/08/2019 00:49    Procedures Procedures (including critical care time)  Medications Ordered in ED Medications - No data to display   Initial Impression / Assessment and Plan / ED Course  I have reviewed the triage vital signs and the nursing notes.  Pertinent labs & imaging results that were available during my care of the patient were reviewed by me and considered in my medical decision making (see chart for details).       32yM with progressive renal disease. Worsening anemia likely from the same. Suspect this is primary etiology of dyspnea. Doesn't seem overtly volume overloaded. Will consult nephrology and medicine service for admission.   Final Clinical Impressions(s) / ED Diagnoses   Final diagnoses:  AKI (acute kidney injury) ([place])  Uremia  Anemia, unspecified type    ED Discharge Orders    None       Virgel Manifold, MD 08/08/19 1016

## 2019-08-08 NOTE — Progress Notes (Signed)
New Admission Note:   Arrival Method: Bed  Mental Orientation: Alert and oriented  Telemetry: Box 20  Assessment: Completed Skin: Intact  IV: Right IJ  And  Pain:  Tubes: none  Safety Measures: Safety Fall Prevention Plan has been given, discussed and signed Admission: Completed [facility] Orientation: Patient has been orientated to the room, unit and staff.  Family:none   Orders have been reviewed and implemented. Will continue to monitor the patient. Call light has been placed within reach and bed alarm has been activated.   Luceil Herrin RN [place] [facility] Phone: [phone]

## 2019-08-08 NOTE — Progress Notes (Signed)
Bilateral upper extremity vein mapping has been completed. Preliminary results can be found in CV Proc through chart review.   08/08/19 3:51 PM William [name] RVT

## 2019-08-08 NOTE — Anesthesia Preprocedure Evaluation (Addendum)
Anesthesia Evaluation  Patient identified by MRN, date of birth, ID band Patient awake    Reviewed: Allergy & Precautions, NPO status , Patient's Chart, lab work & pertinent test results  Airway Mallampati: IV  TM Distance: >3 FB Neck ROM: Full    Dental no notable dental hx.    Pulmonary former smoker,    Pulmonary exam normal breath sounds clear to auscultation       Cardiovascular hypertension, Pt. on medications and Pt. on home beta blockers Normal cardiovascular exam Rhythm:Regular Rate:Normal  ECG: rate 100. Normal sinus rhythm Right atrial enlargement. Rightward axis   Neuro/Psych Issues with speech and response CVA, Residual Symptoms negative psych ROS   GI/Hepatic negative GI ROS, (+)     substance abuse  cocaine use,   Endo/Other  negative endocrine ROS  Renal/GU ESRFRenal disease     Musculoskeletal negative musculoskeletal ROS (+)   Abdominal   Peds  Hematology  (+) anemia , HLD   Anesthesia Other Findings end stage renal disease  Reproductive/Obstetrics                            Anesthesia Physical Anesthesia Plan  ASA: III  Anesthesia Plan: General   Post-op Pain Management:    Induction: Intravenous  PONV Risk Score and Plan: 2 and Ondansetron, Dexamethasone, Midazolam and Treatment may vary due to age or medical condition  Airway Management Planned: LMA  Additional Equipment:   Intra-op Plan:   Post-operative Plan: Extubation in OR  Informed Consent: I have reviewed the patients History and Physical, chart, labs and discussed the procedure including the risks, benefits and alternatives for the proposed anesthesia with the patient or authorized representative who has indicated his/her understanding and acceptance.     Dental advisory given  Plan Discussed with: CRNA  Anesthesia Plan Comments:         Anesthesia Quick Evaluation

## 2019-08-08 NOTE — ED Provider Notes (Signed)
Patient LWBS. His labwork shows Cr 27 (new) and BUN of 117. Not currently on dialysis though has CKD. I called his cell phone and no answer. I called his home phone and his wife answered. While not giving any specific information, I suggested that he come back to the hospital to be evaluated, and she said she would bring him.   Sherwood Gambler, MD 08/08/19 [phone]

## 2019-08-08 NOTE — Anesthesia Postprocedure Evaluation (Signed)
Anesthesia Post Note  Patient: William [name]  Procedure(s) Performed: INSERTION OF right internal jugular DIALYSIS CATHETER (Right Neck)     Patient location during evaluation: PACU Anesthesia Type: General Level of consciousness: awake and alert Pain management: pain level controlled Vital Signs Assessment: post-procedure vital signs reviewed and stable Respiratory status: spontaneous breathing, nonlabored ventilation, respiratory function stable and patient connected to nasal cannula oxygen Cardiovascular status: blood pressure returned to baseline and stable Postop Assessment: no apparent nausea or vomiting Anesthetic complications: no    Last Vitals:  Vitals:   08/08/19 1853 08/08/19 2022  BP: (!) 160/98 (!) 173/101  Pulse: 90 91  Resp:  (!) 21  Temp: 36.9 C 36.9 C  SpO2: 91% 94%    Last Pain:  Vitals:   08/08/19 2022  TempSrc: Oral  PainSc:                  Karyl Kinnier [place]

## 2019-08-08 NOTE — ED Notes (Signed)
Called patient several times patient didn't answer  

## 2019-08-09 ENCOUNTER — Encounter (HOSPITAL_COMMUNITY): Payer: Self-pay | Admitting: Vascular Surgery

## 2019-08-09 LAB — BASIC METABOLIC PANEL
Anion gap: 20 — ABNORMAL HIGH (ref 5–15)
BUN: 128 mg/dL — ABNORMAL HIGH (ref 6–20)
CO2: 13 mmol/L — ABNORMAL LOW (ref 22–32)
Calcium: 9.6 mg/dL (ref 8.9–10.3)
Chloride: 99 mmol/L (ref 98–111)
Creatinine, Ser: 27.86 mg/dL — ABNORMAL HIGH (ref 0.61–1.24)
Glucose, Bld: 138 mg/dL — ABNORMAL HIGH (ref 70–99)
Potassium: 4.7 mmol/L (ref 3.5–5.1)
Sodium: 132 mmol/L — ABNORMAL LOW (ref 135–145)

## 2019-08-09 LAB — CBC
HCT: 23.1 % — ABNORMAL LOW (ref 39.0–52.0)
Hemoglobin: 7.5 g/dL — ABNORMAL LOW (ref 13.0–17.0)
MCH: 30.9 pg (ref 26.0–34.0)
MCHC: 32.5 g/dL (ref 30.0–36.0)
MCV: 95.1 fL (ref 80.0–100.0)
Platelets: 311 10*3/uL (ref 150–400)
RBC: 2.43 MIL/uL — ABNORMAL LOW (ref 4.22–5.81)
RDW: 13 % (ref 11.5–15.5)
WBC: 7.7 10*3/uL (ref 4.0–10.5)
nRBC: 0 % (ref 0.0–0.2)

## 2019-08-09 LAB — PARATHYROID HORMONE, INTACT (NO CA): PTH: 146 pg/mL — ABNORMAL HIGH (ref 15–65)

## 2019-08-09 MED ORDER — SODIUM CHLORIDE 0.9 % IV SOLN: 100.0000 mL | INTRAVENOUS | Status: DC | PRN

## 2019-08-09 MED ORDER — SODIUM CHLORIDE 0.9 % IV SOLN
125.0000 mg | Freq: Every day | INTRAVENOUS | Status: DC
  Administered 2019-08-09 – 2019-08-10 (×2): 125 mg via INTRAVENOUS
  Filled 2019-08-09 (×2): qty 10

## 2019-08-09 MED ORDER — CHLORHEXIDINE GLUCONATE CLOTH 2 % EX PADS
6.0000 | MEDICATED_PAD | Freq: Every day | CUTANEOUS | Status: DC
  Administered 2019-08-10: 6 via TOPICAL

## 2019-08-09 MED ORDER — HEPARIN SODIUM (PORCINE) 1000 UNIT/ML DIALYSIS
1000.0000 [IU] | INTRAMUSCULAR | Status: DC | PRN
  Administered 2019-08-10: 1000 [IU] via INTRAVENOUS_CENTRAL

## 2019-08-09 MED ORDER — PENTAFLUOROPROP-TETRAFLUOROETH EX AERO: 1.0000 "application " | INHALATION_SPRAY | CUTANEOUS | Status: DC | PRN

## 2019-08-09 MED ORDER — PROPOFOL 10 MG/ML IV BOLUS
INTRAVENOUS | Status: AC
  Filled 2019-08-09: qty 20

## 2019-08-09 MED ORDER — SODIUM CHLORIDE 0.9% FLUSH: 10.0000 mL | INTRAVENOUS | Status: DC | PRN

## 2019-08-09 MED ORDER — CHLORHEXIDINE GLUCONATE CLOTH 2 % EX PADS: 6.0000 | MEDICATED_PAD | Freq: Every day | CUTANEOUS | Status: DC

## 2019-08-09 MED ORDER — LIDOCAINE HCL (PF) 1 % IJ SOLN: 5.0000 mL | INTRAMUSCULAR | Status: DC | PRN

## 2019-08-09 MED ORDER — SEVELAMER CARBONATE 800 MG PO TABS
800.0000 mg | ORAL_TABLET | Freq: Three times a day (TID) | ORAL | Status: DC
  Administered 2019-08-09 – 2019-08-10 (×3): 800 mg via ORAL
  Filled 2019-08-09 (×4): qty 1

## 2019-08-09 MED ORDER — LIDOCAINE-PRILOCAINE 2.5-2.5 % EX CREA: 1.0000 "application " | TOPICAL_CREAM | CUTANEOUS | Status: DC | PRN

## 2019-08-09 MED ORDER — DARBEPOETIN ALFA 100 MCG/0.5ML IJ SOSY
100.0000 ug | PREFILLED_SYRINGE | INTRAMUSCULAR | Status: DC
  Filled 2019-08-09: qty 0.5

## 2019-08-09 MED ORDER — ALTEPLASE 2 MG IJ SOLR: 2.0000 mg | Freq: Once | INTRAMUSCULAR | Status: DC | PRN

## 2019-08-09 NOTE — Progress Notes (Signed)
Renal Navigator received notification from Dr. Webb/Nephrologist that patient OP HD referral initiated. Renal Navigator spoke with patient to complete assessment and submitted referral to [facility] Admissions Coordinator.  Renal Navigator will continue to follow closely.  Alphonzo Cruise, [place] [facility]al Navigator [phone]

## 2019-08-09 NOTE — Progress Notes (Signed)
PROGRESS NOTE    William [name]  [id number] DOB: 12/10/1986 DOA: 08/08/2019 PCP: Patient, No Pcp Per   Brief Narrative:  Patient is a 33 year old male with history of hypertension, hyperlipidemia, polysubstance abuse, CVA, CKD stage V who presented with shortness of breath.  He was found to have creatinine in the range of 27.  Nephrology consulted.  He underwent temporary dialysis catheter placement.  Nephrology following.  Plan for initiating dialysis today.  Assessment & Plan:   Principal Problem:   ESRD (end stage renal disease) ([place]) Active Problems:   Uncontrolled hypertension   Stroke ([place])   HLD (hyperlipidemia)   Polysubstance abuse ([place])   Tobacco abuse  CKD stage V/ESRD: CKD stage V progressed to ESRD now.  Thought to be secondary to uncontrolled hypertension and cocaine abuse.  Nephrology following.  He underwent temporary dialysis catheter placement.  Vascular surgery planning for right AV graft placement next week.  He is undergoing for first dialysis session today.  Outpatient dialysis facility will be arranged.  Dyspnea: Most likely secondary to pulmonary vascular congestion due to volume overload.  Saturating fine on room air.  Crackles auscultated on bilateral lung bases.  Undergoing dialysis today.  Chronic normocytic anemia: Associated with ESRD.  Hemoglobin this morning 7.5.  Low iron as per iron studies.  Given IV iron.  Uncontrolled hypertension: Poorly controlled hypertension.  Might be noncompliance issues also.  Currently on medicines Norvasc, Coreg, hydralazine, minoxidil.  History of CVA: On aspirin and Plavix.  Hyperlipidemia: Continue Lipitor  Polysubstance abuse: History of cocaine abuse.  Counseled for cessation  Tobacco dependence: Counseled cessation.           DVT prophylaxis: Heparin [place] Code Status: Full Family Communication: Family members at the bedside Disposition Plan: Home after nephrology clearance, outpatient dialysis  establishment   Consultants: Nephrology, vascular surgery  Procedures: Temporary dialysis catheter placement  Antimicrobials:  Anti-infectives (From admission, onward)   None      Subjective:  Patient seen and examined the bedside this afternoon.  Currently hemodynamically stable.  Complains of shortness of breath.  Denies any cough or chest pain.  Objective: Vitals:   08/08/19 1853 08/08/19 2022 08/09/19 0512 08/09/19 0857  BP: (!) 160/98 (!) 173/101 (!) 189/103 (!) 176/96  Pulse: 90 91 100 (!) 107  Resp:  (!) 21 20 18   Temp: 98.5 F (36.9 C) 98.4 F (36.9 C) 98.2 F (36.8 C) 97.9 F (36.6 C)  TempSrc: Oral Oral Oral Oral  SpO2: 91% 94% 97% 94%  Weight:   84 kg   Height:   5\' 9"  (1.753 m)     Intake/Output Summary (Last 24 hours) at 08/09/2019 1413 Last data filed at 08/09/2019 0900 Gross per 24 hour  Intake 1040 ml  Output 15 ml  Net 1025 ml   Filed Weights   08/08/19 1634 08/09/19 0512  Weight: 90.7 kg 84 kg    Examination:  General exam: Not in distress,average built HEENT:PERRL,Oral mucosa moist, Ear/Nose normal on gross exam Respiratory system: Bilateral crackles in the bases Cardiovascular system: S1 & S2 heard, RRR. No JVD, murmurs, rubs, gallops or clicks. No pedal edema.  Temporary dialysis catheter on the right chest Gastrointestinal system: Abdomen is nondistended, soft and nontender. No organomegaly or masses felt. Normal bowel sounds heard. Central nervous system: Alert and oriented. No focal neurological deficits. Extremities: No edema, no clubbing ,no cyanosis, distal peripheral pulses palpable. Skin: No rashes, lesions or ulcers,no icterus ,no pallor MSK: Normal muscle bulk,tone ,power Psychiatry: Judgement  and insight appear normal. Mood & affect appropriate.     Data Reviewed: I have personally reviewed following labs and imaging studies  CBC: Recent Labs  Lab 08/08/19 0023 08/09/19 0609  WBC 7.7 7.7  NEUTROABS 4.9  --   HGB 7.3*  7.5*  HCT 22.3* 23.1*  MCV 94.9 95.1  PLT 301 [id number]   Basic Metabolic Panel: Recent Labs  Lab 08/08/19 0023 08/08/19 0942 08/09/19 0609  NA 135  --  132*  K 4.0  --  4.7  CL 99  --  99  CO2 15*  --  13*  GLUCOSE 119*  --  138*  BUN 117*  --  128*  CREATININE 27.56*  --  27.86*  CALCIUM 9.8  --  9.6  MG  --  2.4  --   PHOS  --  7.7*  --    GFR: Estimated Creatinine Clearance: 3.7 mL/min (A) (by C-G formula based on SCr of 27.86 mg/dL (H)). Liver Function Tests: No results for input(s): AST, ALT, ALKPHOS, BILITOT, PROT, ALBUMIN in the last 168 hours. No results for input(s): LIPASE, AMYLASE in the last 168 hours. No results for input(s): AMMONIA in the last 168 hours. Coagulation Profile: No results for input(s): INR, PROTIME in the last 168 hours. Cardiac Enzymes: No results for input(s): CKTOTAL, CKMB, CKMBINDEX, TROPONINI in the last 168 hours. BNP (last 3 results) No results for input(s): PROBNP in the last 8760 hours. HbA1C: No results for input(s): HGBA1C in the last 72 hours. CBG: No results for input(s): GLUCAP in the last 168 hours. Lipid Profile: No results for input(s): CHOL, HDL, LDLCALC, TRIG, CHOLHDL, LDLDIRECT in the last 72 hours. Thyroid Function Tests: No results for input(s): TSH, T4TOTAL, FREET4, T3FREE, THYROIDAB in the last 72 hours. Anemia Panel: Recent Labs    08/08/19 1440  TIBC 311  IRON 30*   Sepsis Labs: No results for input(s): PROCALCITON, LATICACIDVEN in the last 168 hours.  Recent Results (from the past 240 hour(s))  SARS CORONAVIRUS 2 (TAT 6-12 HRS) Nasal Swab Aptima Multi Swab     Status: None   Collection Time: 08/08/19  9:41 AM   Specimen: Aptima Multi Swab; Nasal Swab  Result Value Ref Range Status   SARS Coronavirus 2 NEGATIVE NEGATIVE Final    Comment: (NOTE) SARS-CoV-2 target nucleic acids are NOT DETECTED. The SARS-CoV-2 RNA is generally detectable in upper and lower respiratory specimens during the acute phase of  infection. Negative results do not preclude SARS-CoV-2 infection, do not rule out co-infections with other pathogens, and should not be used as the sole basis for treatment or other patient management decisions. Negative results must be combined with clinical observations, patient history, and epidemiological information. The expected result is Negative. Fact Sheet for Patients: SugarRoll.be Fact Sheet for Healthcare Providers: https://www.woods-mathews.com/ This test is not yet approved or cleared by the [place] FDA and  has been authorized for detection and/or diagnosis of SARS-CoV-2 by FDA under an Emergency Use Authorization (EUA). This EUA will remain  in effect (meaning this test can be used) for the duration of the COVID-19 declaration under Section 56 4(b)(1) of the Act, 21 U.S.C. section 360bbb-3(b)(1), unless the authorization is terminated or revoked sooner. Performed at [facility], [place] [address] [place], [place] [address]          Radiology Studies: Dg Chest 2 View  Result Date: 08/08/2019 CLINICAL DATA:  Shortness of breath EXAM: CHEST - 2 VIEW COMPARISON:  06/21/2019 FINDINGS:  Small left pleural effusion. No focal airspace consolidation or pulmonary edema. No pneumothorax. Normal cardiomediastinal contours. IMPRESSION: Small left pleural effusion Electronically Signed   By: Ulyses Jarred M.D.   On: 08/08/2019 00:49   Dg Chest Port 1 View  Result Date: 08/08/2019 CLINICAL DATA:  Insertion of dialysis catheter EXAM: PORTABLE CHEST 1 VIEW COMPARISON:  08/08/2019 FINDINGS: Right internal jugular dialysis catheter is been placed with the tip at the cavoatrial junction. No pneumothorax. Cardiomegaly. Layering left effusion with left lower lobe atelectasis or infiltrate. Minimal right base atelectasis. Mild vascular congestion. IMPRESSION: Right dialysis catheter placement with the tip at the cavoatrial junction. No  pneumothorax. Cardiomegaly, vascular congestion. Layering left effusion with left base atelectasis or infiltrate. Right base atelectasis. Electronically Signed   By: Rolm Baptise M.D.   On: 08/08/2019 18:31   Dg Fluoro Guide Cv Line-no Report  Result Date: 08/08/2019 Fluoroscopy was utilized by the requesting physician.  No radiographic interpretation.   Vas [place] Upper Ext Vein Mapping (pre-op Avf)  Result Date: 08/08/2019 UPPER EXTREMITY VEIN MAPPING  Indications: Pre-access. Comparison Study: No prior studies. Performing Technologist: Oliver Hum RVT  Examination Guidelines: A complete evaluation includes B-mode imaging, spectral Doppler, color Doppler, and power Doppler as needed of all accessible portions of each vessel. Bilateral testing is considered an integral part of a complete examination. Limited examinations for reoccurring indications may be performed as noted. +-----------------+-------------+----------+--------+ Right Cephalic   Diameter (cm)Depth (cm)Findings +-----------------+-------------+----------+--------+ Shoulder             0.22        0.48            +-----------------+-------------+----------+--------+ Prox upper arm       0.17        0.22            +-----------------+-------------+----------+--------+ Mid upper arm        0.19        0.36            +-----------------+-------------+----------+--------+ Dist upper arm       0.27        0.18            +-----------------+-------------+----------+--------+ Antecubital fossa    0.20        0.19            +-----------------+-------------+----------+--------+ Prox forearm         0.13        0.26            +-----------------+-------------+----------+--------+ Mid forearm          0.14        0.23            +-----------------+-------------+----------+--------+ Dist forearm         0.10        0.20            +-----------------+-------------+----------+--------+  +-----------------+-------------+----------+---------+ Right Basilic    Diameter (cm)Depth (cm)Findings  +-----------------+-------------+----------+---------+ Shoulder             0.24        0.72             +-----------------+-------------+----------+---------+ Dist upper arm       0.24        0.37   branching +-----------------+-------------+----------+---------+ Antecubital fossa    0.07        0.27             +-----------------+-------------+----------+---------+ Prox forearm  0.10        0.16             +-----------------+-------------+----------+---------+ Mid forearm          0.06        0.13             +-----------------+-------------+----------+---------+ Distal forearm       0.06        0.20             +-----------------+-------------+----------+---------+ +-----------------+-------------+----------+--------------+ Left Cephalic    Diameter (cm)Depth (cm)   Findings    +-----------------+-------------+----------+--------------+ Shoulder             0.26        0.43                  +-----------------+-------------+----------+--------------+ Prox upper arm       0.25        0.43                  +-----------------+-------------+----------+--------------+ Mid upper arm        0.17        0.32                  +-----------------+-------------+----------+--------------+ Dist upper arm       0.19        0.27                  +-----------------+-------------+----------+--------------+ Antecubital fossa    0.24        0.20     branching    +-----------------+-------------+----------+--------------+ Prox forearm                            not visualized +-----------------+-------------+----------+--------------+ Mid forearm          0.14        0.21                  +-----------------+-------------+----------+--------------+ Dist forearm         0.16        0.24                   +-----------------+-------------+----------+--------------+ +-----------------+-------------+----------+---------+ Left Basilic     Diameter (cm)Depth (cm)Findings  +-----------------+-------------+----------+---------+ Shoulder             0.30        0.86             +-----------------+-------------+----------+---------+ Mid upper arm        0.22        0.82             +-----------------+-------------+----------+---------+ Dist upper arm       0.21        0.52   branching +-----------------+-------------+----------+---------+ Antecubital fossa    0.11        0.17             +-----------------+-------------+----------+---------+ Prox forearm         0.06        0.12             +-----------------+-------------+----------+---------+ Mid forearm          0.08        0.12             +-----------------+-------------+----------+---------+ Distal forearm       0.08        0.18             +-----------------+-------------+----------+---------+ *  See table(s) above for measurements and observations.  Diagnosing physician: Deitra Mayo MD Electronically signed by Deitra Mayo MD on 08/08/2019 at 6:36:20 PM.    Final         Scheduled Meds: . amLODipine  10 mg Oral QHS  . aspirin  81 mg Oral Daily  . atorvastatin  40 mg Oral q1800  . carvedilol  25 mg Oral BID WC  . Chlorhexidine Gluconate Cloth  6 each Topical Q0600  . clopidogrel  75 mg Oral Daily  . darbepoetin (ARANESP) injection - DIALYSIS  100 mcg Intravenous Q Wed-HD  . docusate sodium  100 mg Oral BID  . heparin  5,000 Units Subcutaneous Q8H  . hydrALAZINE  100 mg Oral Q8H  . minoxidil  2.5 mg Oral BID  . sevelamer carbonate  800 mg Oral TID WC   Continuous Infusions: . sodium chloride 10 mL/hr at 08/08/19 1639  . ferric gluconate (FERRLECIT/NULECIT) IV       LOS: 1 day    Time spent: 35 mins.More than 50% of that time was spent in counseling and/or coordination of care.       Shelly Coss, MD [facility] Pager [phone]  If 7PM-7AM, please contact night-coverage [email or web address] Password [email or web address] 08/09/2019, 2:13 PM

## 2019-08-09 NOTE — Progress Notes (Signed)
VASCULAR SURGERY:  I placed his catheter last night.  His vein map shows that he does not appear to have any adequate vein for AV fistula on either side.  We will plan placement of a AV graft in the right arm next week.  Deitra Mayo, MD, [place] [phone] Office: [phone]

## 2019-08-09 NOTE — Progress Notes (Signed)
[facility] ROUNDING NOTE   Subjective:   This is a very pleasant 63 gentleman with a history of hypertension chronic kidney disease stage V followed at [facility].  He is a history of hypertension emergency admitted with acute CVA in July 2020 has had progressive loss of renal function since July 2020 is now deemed end-stage renal disease.  Appreciate the assistance from Dr. Deitra Mayo.  Placement of tunneled PermCath 08/07/2019.  We will plan dialysis 08/09/2019 and initiate clip process.  Blood pressure 170/78 pulse 91 temperature 97.9 O2 sats 98% room air  Sodium 132 potassium 4.7 chloride 99 CO2 13 creatinine 27.86 BUN 128 glucose 138 WBC 7.7 hemoglobin 7.5 platelets 311 T sats 10% phosphorus 7.7 magnesium 2.4   Objective:  Vital signs in last 24 hours:  Temp:  [97.2 F (36.2 C)-98.5 F (36.9 C)] 97.9 F (36.6 C) (08/26 0857) Pulse Rate:  [88-107] 107 (08/26 0857) Resp:  [18-30] 18 (08/26 0857) BP: (134-189)/(77-103) 176/96 (08/26 0857) SpO2:  [91 %-99 %] 94 % (08/26 0857) Weight:  [84 kg-90.7 kg] 84 kg (08/26 0512)  Weight change:  Filed Weights   08/08/19 1634 08/09/19 0512  Weight: 90.7 kg 84 kg    Intake/Output: I/O last 3 completed shifts: In: 42 [P.O.:600; I.V.:200] Out: 15 [Blood:15]   Intake/Output this shift:  Total I/O In: 240 [P.O.:240] Out: 0   Alert nondistressed comfortable CVS regular rate rhythm no murmurs rubs gallops Respiratory system clear to auscultation no wheezes rales right IJ catheter site clean dry Abdomen soft nontender bowel sounds present Remedies no evidence of edema cyanosis or clubbing   Basic Metabolic Panel: Recent Labs  Lab 08/08/19 0023 08/08/19 0942 08/09/19 0609  NA 135  --  132*  K 4.0  --  4.7  CL 99  --  99  CO2 15*  --  13*  GLUCOSE 119*  --  138*  BUN 117*  --  128*  CREATININE 27.56*  --  27.86*  CALCIUM 9.8  --  9.6  MG  --  2.4  --   PHOS  --  7.7*  --     Liver  Function Tests: No results for input(s): AST, ALT, ALKPHOS, BILITOT, PROT, ALBUMIN in the last 168 hours. No results for input(s): LIPASE, AMYLASE in the last 168 hours. No results for input(s): AMMONIA in the last 168 hours.  CBC: Recent Labs  Lab 08/08/19 0023 08/09/19 0609  WBC 7.7 7.7  NEUTROABS 4.9  --   HGB 7.3* 7.5*  HCT 22.3* 23.1*  MCV 94.9 95.1  PLT 301 311    Cardiac Enzymes: No results for input(s): CKTOTAL, CKMB, CKMBINDEX, TROPONINI in the last 168 hours.  BNP: Invalid input(s): POCBNP  CBG: No results for input(s): GLUCAP in the last 168 hours.  Microbiology: Results for orders placed or performed during the hospital encounter of 08/08/19  SARS CORONAVIRUS 2 (TAT 6-12 HRS) Nasal Swab Aptima Multi Swab     Status: None   Collection Time: 08/08/19  9:41 AM   Specimen: Aptima Multi Swab; Nasal Swab  Result Value Ref Range Status   SARS Coronavirus 2 NEGATIVE NEGATIVE Final    Comment: (NOTE) SARS-CoV-2 target nucleic acids are NOT DETECTED. The SARS-CoV-2 RNA is generally detectable in upper and lower respiratory specimens during the acute phase of infection. Negative results do not preclude SARS-CoV-2 infection, do not rule out co-infections with other pathogens, and should not be used as the sole basis for treatment or  other patient management decisions. Negative results must be combined with clinical observations, patient history, and epidemiological information. The expected result is Negative. Fact Sheet for Patients: SugarRoll.be Fact Sheet for Healthcare Providers: https://www.woods-mathews.com/ This test is not yet approved or cleared by the [place] FDA and  has been authorized for detection and/or diagnosis of SARS-CoV-2 by FDA under an Emergency Use Authorization (EUA). This EUA will remain  in effect (meaning this test can be used) for the duration of the COVID-19 declaration under Section  56 4(b)(1) of the Act, 21 U.S.C. section 360bbb-3(b)(1), unless the authorization is terminated or revoked sooner. Performed at [facility], [place] [address], [place], [place] [address]     Coagulation Studies: No results for input(s): LABPROT, INR in the last 72 hours.  Urinalysis: Recent Labs    08/08/19 1440  COLORURINE YELLOW  LABSPEC 1.012  PHURINE 5.0  GLUCOSEU NEGATIVE  HGBUR NEGATIVE  BILIRUBINUR NEGATIVE  KETONESUR NEGATIVE  PROTEINUR >=300*  NITRITE NEGATIVE  LEUKOCYTESUR NEGATIVE      Imaging: Dg Chest 2 View  Result Date: 08/08/2019 CLINICAL DATA:  Shortness of breath EXAM: CHEST - 2 VIEW COMPARISON:  06/21/2019 FINDINGS: Small left pleural effusion. No focal airspace consolidation or pulmonary edema. No pneumothorax. Normal cardiomediastinal contours. IMPRESSION: Small left pleural effusion Electronically Signed   By: Ulyses Jarred M.D.   On: 08/08/2019 00:49   Dg Chest Port 1 View  Result Date: 08/08/2019 CLINICAL DATA:  Insertion of dialysis catheter EXAM: PORTABLE CHEST 1 VIEW COMPARISON:  08/08/2019 FINDINGS: Right internal jugular dialysis catheter is been placed with the tip at the cavoatrial junction. No pneumothorax. Cardiomegaly. Layering left effusion with left lower lobe atelectasis or infiltrate. Minimal right base atelectasis. Mild vascular congestion. IMPRESSION: Right dialysis catheter placement with the tip at the cavoatrial junction. No pneumothorax. Cardiomegaly, vascular congestion. Layering left effusion with left base atelectasis or infiltrate. Right base atelectasis. Electronically Signed   By: Rolm Baptise M.D.   On: 08/08/2019 18:31   Dg Fluoro Guide Cv Line-no Report  Result Date: 08/08/2019 Fluoroscopy was utilized by the requesting physician.  No radiographic interpretation.   Vas [place] Upper Ext Vein Mapping (pre-op Avf)  Result Date: 08/08/2019 UPPER EXTREMITY VEIN MAPPING  Indications: Pre-access. Comparison Study: No prior  studies. Performing Technologist: Oliver Hum RVT  Examination Guidelines: A complete evaluation includes B-mode imaging, spectral Doppler, color Doppler, and power Doppler as needed of all accessible portions of each vessel. Bilateral testing is considered an integral part of a complete examination. Limited examinations for reoccurring indications may be performed as noted. +-----------------+-------------+----------+--------+ Right Cephalic   Diameter (cm)Depth (cm)Findings +-----------------+-------------+----------+--------+ Shoulder             0.22        0.48            +-----------------+-------------+----------+--------+ Prox upper arm       0.17        0.22            +-----------------+-------------+----------+--------+ Mid upper arm        0.19        0.36            +-----------------+-------------+----------+--------+ Dist upper arm       0.27        0.18            +-----------------+-------------+----------+--------+ Antecubital fossa    0.20        0.19            +-----------------+-------------+----------+--------+  Prox forearm         0.13        0.26            +-----------------+-------------+----------+--------+ Mid forearm          0.14        0.23            +-----------------+-------------+----------+--------+ Dist forearm         0.10        0.20            +-----------------+-------------+----------+--------+ +-----------------+-------------+----------+---------+ Right Basilic    Diameter (cm)Depth (cm)Findings  +-----------------+-------------+----------+---------+ Shoulder             0.24        0.72             +-----------------+-------------+----------+---------+ Dist upper arm       0.24        0.37   branching +-----------------+-------------+----------+---------+ Antecubital fossa    0.07        0.27             +-----------------+-------------+----------+---------+ Prox forearm         0.10        0.16              +-----------------+-------------+----------+---------+ Mid forearm          0.06        0.13             +-----------------+-------------+----------+---------+ Distal forearm       0.06        0.20             +-----------------+-------------+----------+---------+ +-----------------+-------------+----------+--------------+ Left Cephalic    Diameter (cm)Depth (cm)   Findings    +-----------------+-------------+----------+--------------+ Shoulder             0.26        0.43                  +-----------------+-------------+----------+--------------+ Prox upper arm       0.25        0.43                  +-----------------+-------------+----------+--------------+ Mid upper arm        0.17        0.32                  +-----------------+-------------+----------+--------------+ Dist upper arm       0.19        0.27                  +-----------------+-------------+----------+--------------+ Antecubital fossa    0.24        0.20     branching    +-----------------+-------------+----------+--------------+ Prox forearm                            not visualized +-----------------+-------------+----------+--------------+ Mid forearm          0.14        0.21                  +-----------------+-------------+----------+--------------+ Dist forearm         0.16        0.24                  +-----------------+-------------+----------+--------------+ +-----------------+-------------+----------+---------+ Left Basilic     Diameter (cm)Depth (cm)Findings  +-----------------+-------------+----------+---------+ Shoulder  0.30        0.86             +-----------------+-------------+----------+---------+ Mid upper arm        0.22        0.82             +-----------------+-------------+----------+---------+ Dist upper arm       0.21        0.52   branching +-----------------+-------------+----------+---------+ Antecubital fossa    0.11         0.17             +-----------------+-------------+----------+---------+ Prox forearm         0.06        0.12             +-----------------+-------------+----------+---------+ Mid forearm          0.08        0.12             +-----------------+-------------+----------+---------+ Distal forearm       0.08        0.18             +-----------------+-------------+----------+---------+ *See table(s) above for measurements and observations.  Diagnosing physician: Deitra Mayo MD Electronically signed by Deitra Mayo MD on 08/08/2019 at 6:36:20 PM.    Final      Medications:   . sodium chloride 10 mL/hr at 08/08/19 1639   . amLODipine  10 mg Oral QHS  . aspirin  81 mg Oral Daily  . atorvastatin  40 mg Oral q1800  . carvedilol  25 mg Oral BID WC  . clopidogrel  75 mg Oral Daily  . docusate sodium  100 mg Oral BID  . heparin  5,000 Units Subcutaneous Q8H  . hydrALAZINE  100 mg Oral Q8H  . minoxidil  2.5 mg Oral BID   acetaminophen **OR** acetaminophen, calcium carbonate (dosed in mg elemental calcium), camphor-menthol **AND** hydrOXYzine, docusate sodium, feeding supplement (NEPRO CARB STEADY), ondansetron **OR** ondansetron (ZOFRAN) IV, oxyCODONE-acetaminophen, sodium chloride flush, sorbitol, zolpidem  Assessment/ Plan:   Chronic renal insufficiency stage V with progressive loss of renal function probably secondary to uncontrolled hypertension and history of cocaine use.  Will initiate dialysis 08/09/2019 this will be his first dialysis treatment.  Will need clip process started will inform clip coordinator Colleen  Anemia iron deficiency will start IV iron  Hypertension/volume should improve with dialysis will need to titrate antihypertensives accordingly  Bones PTH 146.  Phosphorus elevated will need diet advice as well as initiation of non-calcium based binders.  We will start Renvela 1 with meals  Vascular access appreciate assistance from Dr.  Deitra Mayo will be unable to place AV fistula due to small caliber vessels plan for AV graft placement early next week.   LOS: [place] @TODAY @10 :40 AM

## 2019-08-10 ENCOUNTER — Ambulatory Visit: Payer: Medicaid Other | Admitting: Speech Pathology

## 2019-08-10 ENCOUNTER — Ambulatory Visit: Payer: Medicaid Other | Admitting: Physical Therapy

## 2019-08-10 LAB — HEPATITIS B SURFACE ANTIGEN: Hepatitis B Surface Ag: NEGATIVE

## 2019-08-10 LAB — CBC WITH DIFFERENTIAL/PLATELET
Abs Immature Granulocytes: 0.15 10*3/uL — ABNORMAL HIGH (ref 0.00–0.07)
Basophils Absolute: 0.1 10*3/uL (ref 0.0–0.1)
Basophils Relative: 1 %
Eosinophils Absolute: 0 10*3/uL (ref 0.0–0.5)
Eosinophils Relative: 0 %
HCT: 21.2 % — ABNORMAL LOW (ref 39.0–52.0)
Hemoglobin: 7 g/dL — ABNORMAL LOW (ref 13.0–17.0)
Immature Granulocytes: 1 %
Lymphocytes Relative: 19 %
Lymphs Abs: 2 10*3/uL (ref 0.7–4.0)
MCH: 30.8 pg (ref 26.0–34.0)
MCHC: 33 g/dL (ref 30.0–36.0)
MCV: 93.4 fL (ref 80.0–100.0)
Monocytes Absolute: 1 10*3/uL (ref 0.1–1.0)
Monocytes Relative: 10 %
Neutro Abs: 7.2 10*3/uL (ref 1.7–7.7)
Neutrophils Relative %: 69 %
Platelets: 319 10*3/uL (ref 150–400)
RBC: 2.27 MIL/uL — ABNORMAL LOW (ref 4.22–5.81)
RDW: 13 % (ref 11.5–15.5)
WBC: 10.4 10*3/uL (ref 4.0–10.5)
nRBC: 0.2 % (ref 0.0–0.2)

## 2019-08-10 LAB — HEPATITIS B SURFACE ANTIBODY,QUALITATIVE: Hep B S Ab: REACTIVE

## 2019-08-10 LAB — BASIC METABOLIC PANEL
Anion gap: 18 — ABNORMAL HIGH (ref 5–15)
BUN: 100 mg/dL — ABNORMAL HIGH (ref 6–20)
CO2: 18 mmol/L — ABNORMAL LOW (ref 22–32)
Calcium: 9.5 mg/dL (ref 8.9–10.3)
Chloride: 97 mmol/L — ABNORMAL LOW (ref 98–111)
Creatinine, Ser: 22.19 mg/dL — ABNORMAL HIGH (ref 0.61–1.24)
GFR calc Af Amer: 3 mL/min — ABNORMAL LOW (ref >60)
GFR calc non Af Amer: 2 mL/min — ABNORMAL LOW (ref >60)
Glucose, Bld: 118 mg/dL — ABNORMAL HIGH (ref 70–99)
Potassium: 3.9 mmol/L (ref 3.5–5.1)
Sodium: 133 mmol/L — ABNORMAL LOW (ref 135–145)

## 2019-08-10 LAB — HEPATITIS B CORE ANTIBODY, TOTAL: Hep B Core Total Ab: NEGATIVE

## 2019-08-10 MED ORDER — CHLORPROMAZINE HCL 10 MG PO TABS: 10.0000 mg | ORAL_TABLET | Freq: Three times a day (TID) | ORAL | 0 refills | Status: AC | PRN

## 2019-08-10 MED ORDER — CHLORPROMAZINE HCL 10 MG PO TABS
10.0000 mg | ORAL_TABLET | Freq: Three times a day (TID) | ORAL | Status: DC | PRN
  Administered 2019-08-10 (×2): 10 mg via ORAL
  Filled 2019-08-10 (×3): qty 1

## 2019-08-10 MED ORDER — DARBEPOETIN ALFA 100 MCG/0.5ML IJ SOSY: 100.0000 ug | PREFILLED_SYRINGE | INTRAMUSCULAR | Status: DC

## 2019-08-10 MED ORDER — HEPARIN SODIUM (PORCINE) 1000 UNIT/ML IJ SOLN
INTRAMUSCULAR | Status: AC
  Filled 2019-08-10: qty 4

## 2019-08-10 MED ORDER — CHLORHEXIDINE GLUCONATE CLOTH 2 % EX PADS: 6.0000 | MEDICATED_PAD | Freq: Every day | CUTANEOUS | Status: DC

## 2019-08-10 NOTE — Progress Notes (Signed)
[facility] ROUNDING NOTE   Subjective:   This is a very pleasant 53 gentleman with a history of hypertension chronic kidney disease stage V followed at [facility].  He is a history of hypertension emergency admitted with acute CVA in July 2020 has had progressive loss of renal function since July 2020 is now deemed end-stage renal disease.  Appreciate the assistance from Dr. Deitra Mayo.  Placement of tunneled PermCath 08/07/2019.  Successful dialysis 08/09/2019 with removal of 2 L she is scheduled for dialysis 08/10/2019.  We will also schedule his third dialysis treatment for 08/11/2019  Blood pressure 168/104 pulse 102 temperature 98 O2 sats 98% room air  Sodium 133 potassium 3.9 chloride 97 CO2 18 BUN 106 creatinine 22 glucose 118 calcium 9.5 WBC 10.4 hemoglobin 7 platelets 319  Amlodipine 10 mg daily aspirin 81 mg day Lipitor 40 mg take available 25 mg twice daily, darbepoetin 100 mcg q. Wednesday, hydralazine 100 mg every 8 hours minoxidil 2.5 mg twice daily Renvela 800 mg 1 with meals   Objective:  Vital signs in last 24 hours:  Temp:  [97.9 F (36.6 C)-99.4 F (37.4 C)] 98 F (36.7 C) (08/27 0459) Pulse Rate:  [79-107] 102 (08/27 0459) Resp:  [18-24] 20 (08/27 0459) BP: (95-176)/(54-104) 168/104 (08/27 0459) SpO2:  [91 %-98 %] 98 % (08/27 0459) Weight:  [81.5 kg-85 kg] 85 kg (08/26 2053)  Weight change: -9.219 kg Filed Weights   08/09/19 0512 08/09/19 1825 08/09/19 2053  Weight: 84 kg 81.5 kg 85 kg    Intake/Output: I/O last 3 completed shifts: In: 1350.2 [P.O.:1080; I.V.:160.2; IV Piggyback:110] Out: 2000 [Other:2000]   Intake/Output this shift:  No intake/output data recorded.  Alert nondistressed comfortable CVS regular rate rhythm no murmurs rubs gallops Respiratory system clear to auscultation no wheezes rales right IJ catheter site clean dry Abdomen soft nontender bowel sounds present Remedies no evidence of edema cyanosis  or clubbing   Basic Metabolic Panel: Recent Labs  Lab 08/08/19 0023 08/08/19 0942 08/09/19 0609 08/10/19 0535  NA 135  --  132* 133*  K 4.0  --  4.7 3.9  CL 99  --  99 97*  CO2 15*  --  13* 18*  GLUCOSE 119*  --  138* 118*  BUN 117*  --  128* 100*  CREATININE 27.56*  --  27.86* 22.19*  CALCIUM 9.8  --  9.6 9.5  MG  --  2.4  --   --   PHOS  --  7.7*  --   --     Liver Function Tests: No results for input(s): AST, ALT, ALKPHOS, BILITOT, PROT, ALBUMIN in the last 168 hours. No results for input(s): LIPASE, AMYLASE in the last 168 hours. No results for input(s): AMMONIA in the last 168 hours.  CBC: Recent Labs  Lab 08/08/19 0023 08/09/19 0609 08/10/19 0535  WBC 7.7 7.7 10.4  NEUTROABS 4.9  --  7.2  HGB 7.3* 7.5* 7.0*  HCT 22.3* 23.1* 21.2*  MCV 94.9 95.1 93.4  PLT 301 311 319    Cardiac Enzymes: No results for input(s): CKTOTAL, CKMB, CKMBINDEX, TROPONINI in the last 168 hours.  BNP: Invalid input(s): POCBNP  CBG: No results for input(s): GLUCAP in the last 168 hours.  Microbiology: Results for orders placed or performed during the hospital encounter of 08/08/19  SARS CORONAVIRUS 2 (TAT 6-12 HRS) Nasal Swab Aptima Multi Swab     Status: None   Collection Time: 08/08/19  9:41 AM  Specimen: Aptima Multi Swab; Nasal Swab  Result Value Ref Range Status   SARS Coronavirus 2 NEGATIVE NEGATIVE Final    Comment: (NOTE) SARS-CoV-2 target nucleic acids are NOT DETECTED. The SARS-CoV-2 RNA is generally detectable in upper and lower respiratory specimens during the acute phase of infection. Negative results do not preclude SARS-CoV-2 infection, do not rule out co-infections with other pathogens, and should not be used as the sole basis for treatment or other patient management decisions. Negative results must be combined with clinical observations, patient history, and epidemiological information. The expected result is Negative. Fact Sheet for  Patients: SugarRoll.be Fact Sheet for Healthcare Providers: https://www.woods-mathews.com/ This test is not yet approved or cleared by the [place] FDA and  has been authorized for detection and/or diagnosis of SARS-CoV-2 by FDA under an Emergency Use Authorization (EUA). This EUA will remain  in effect (meaning this test can be used) for the duration of the COVID-19 declaration under Section 56 4(b)(1) of the Act, 21 U.S.C. section 360bbb-3(b)(1), unless the authorization is terminated or revoked sooner. Performed at [facility], [place] [address], [place], [place] [address]     Coagulation Studies: No results for input(s): LABPROT, INR in the last 72 hours.  Urinalysis: Recent Labs    08/08/19 1440  COLORURINE YELLOW  LABSPEC 1.012  PHURINE 5.0  GLUCOSEU NEGATIVE  HGBUR NEGATIVE  BILIRUBINUR NEGATIVE  KETONESUR NEGATIVE  PROTEINUR >=300*  NITRITE NEGATIVE  LEUKOCYTESUR NEGATIVE      Imaging: Dg Chest Port 1 View  Result Date: 08/08/2019 CLINICAL DATA:  Insertion of dialysis catheter EXAM: PORTABLE CHEST 1 VIEW COMPARISON:  08/08/2019 FINDINGS: Right internal jugular dialysis catheter is been placed with the tip at the cavoatrial junction. No pneumothorax. Cardiomegaly. Layering left effusion with left lower lobe atelectasis or infiltrate. Minimal right base atelectasis. Mild vascular congestion. IMPRESSION: Right dialysis catheter placement with the tip at the cavoatrial junction. No pneumothorax. Cardiomegaly, vascular congestion. Layering left effusion with left base atelectasis or infiltrate. Right base atelectasis. Electronically Signed   By: Rolm Baptise M.D.   On: 08/08/2019 18:31   Dg Fluoro Guide Cv Line-no Report  Result Date: 08/08/2019 Fluoroscopy was utilized by the requesting physician.  No radiographic interpretation.   Vas [place] Upper Ext Vein Mapping (pre-op Avf)  Result Date: 08/08/2019 UPPER EXTREMITY  VEIN MAPPING  Indications: Pre-access. Comparison Study: No prior studies. Performing Technologist: Oliver Hum RVT  Examination Guidelines: A complete evaluation includes B-mode imaging, spectral Doppler, color Doppler, and power Doppler as needed of all accessible portions of each vessel. Bilateral testing is considered an integral part of a complete examination. Limited examinations for reoccurring indications may be performed as noted. +-----------------+-------------+----------+--------+ Right Cephalic   Diameter (cm)Depth (cm)Findings +-----------------+-------------+----------+--------+ Shoulder             0.22        0.48            +-----------------+-------------+----------+--------+ Prox upper arm       0.17        0.22            +-----------------+-------------+----------+--------+ Mid upper arm        0.19        0.36            +-----------------+-------------+----------+--------+ Dist upper arm       0.27        0.18            +-----------------+-------------+----------+--------+ Antecubital fossa  0.20        0.19            +-----------------+-------------+----------+--------+ Prox forearm         0.13        0.26            +-----------------+-------------+----------+--------+ Mid forearm          0.14        0.23            +-----------------+-------------+----------+--------+ Dist forearm         0.10        0.20            +-----------------+-------------+----------+--------+ +-----------------+-------------+----------+---------+ Right Basilic    Diameter (cm)Depth (cm)Findings  +-----------------+-------------+----------+---------+ Shoulder             0.24        0.72             +-----------------+-------------+----------+---------+ Dist upper arm       0.24        0.37   branching +-----------------+-------------+----------+---------+ Antecubital fossa    0.07        0.27              +-----------------+-------------+----------+---------+ Prox forearm         0.10        0.16             +-----------------+-------------+----------+---------+ Mid forearm          0.06        0.13             +-----------------+-------------+----------+---------+ Distal forearm       0.06        0.20             +-----------------+-------------+----------+---------+ +-----------------+-------------+----------+--------------+ Left Cephalic    Diameter (cm)Depth (cm)   Findings    +-----------------+-------------+----------+--------------+ Shoulder             0.26        0.43                  +-----------------+-------------+----------+--------------+ Prox upper arm       0.25        0.43                  +-----------------+-------------+----------+--------------+ Mid upper arm        0.17        0.32                  +-----------------+-------------+----------+--------------+ Dist upper arm       0.19        0.27                  +-----------------+-------------+----------+--------------+ Antecubital fossa    0.24        0.20     branching    +-----------------+-------------+----------+--------------+ Prox forearm                            not visualized +-----------------+-------------+----------+--------------+ Mid forearm          0.14        0.21                  +-----------------+-------------+----------+--------------+ Dist forearm         0.16        0.24                  +-----------------+-------------+----------+--------------+ +-----------------+-------------+----------+---------+  Left Basilic     Diameter (cm)Depth (cm)Findings  +-----------------+-------------+----------+---------+ Shoulder             0.30        0.86             +-----------------+-------------+----------+---------+ Mid upper arm        0.22        0.82             +-----------------+-------------+----------+---------+ Dist upper arm       0.21         0.52   branching +-----------------+-------------+----------+---------+ Antecubital fossa    0.11        0.17             +-----------------+-------------+----------+---------+ Prox forearm         0.06        0.12             +-----------------+-------------+----------+---------+ Mid forearm          0.08        0.12             +-----------------+-------------+----------+---------+ Distal forearm       0.08        0.18             +-----------------+-------------+----------+---------+ *See table(s) above for measurements and observations.  Diagnosing physician: Deitra Mayo MD Electronically signed by Deitra Mayo MD on 08/08/2019 at 6:36:20 PM.    Final      Medications:   . sodium chloride 10 mL/hr at 08/08/19 1639  . sodium chloride    . sodium chloride    . ferric gluconate (FERRLECIT/NULECIT) IV 125 mg (08/09/19 1444)   . amLODipine  10 mg Oral QHS  . aspirin  81 mg Oral Daily  . atorvastatin  40 mg Oral q1800  . carvedilol  25 mg Oral BID WC  . Chlorhexidine Gluconate Cloth  6 each Topical Q0600  . clopidogrel  75 mg Oral Daily  . darbepoetin (ARANESP) injection - DIALYSIS  100 mcg Intravenous Q Wed-HD  . docusate sodium  100 mg Oral BID  . heparin  5,000 Units Subcutaneous Q8H  . hydrALAZINE  100 mg Oral Q8H  . minoxidil  2.5 mg Oral BID  . sevelamer carbonate  800 mg Oral TID WC   sodium chloride, sodium chloride, acetaminophen **OR** acetaminophen, alteplase, calcium carbonate (dosed in mg elemental calcium), camphor-menthol **AND** hydrOXYzine, docusate sodium, feeding supplement (NEPRO CARB STEADY), heparin, lidocaine (PF), lidocaine-prilocaine, ondansetron **OR** ondansetron (ZOFRAN) IV, oxyCODONE-acetaminophen, pentafluoroprop-tetrafluoroeth, sodium chloride flush, sorbitol, zolpidem  Assessment/ Plan:   Chronic renal insufficiency stage V with progressive loss of renal function probably secondary to uncontrolled hypertension and history  of cocaine use.  Tolerated his first dialysis treatment well 08/09/2019 with ultrafiltration of 2 L.  Currently clip process in place discussed with dialysis coordinator.  He will receive dialysis 08/10/2019 and 08/11/2019  Anemia iron deficiency will start IV iron darbepoetin administered 100 mcg every Wednesday  Hypertension/volume should improve with dialysis will need to titrate antihypertensives accordingly we will continue to follow blood pressure control with ultrafiltration  Bones PTH 146.  Phosphorus elevated will need diet advice as well as initiation of non-calcium based binders.  Appears to be  tolerating his Renvela  Vascular access appreciate assistance from Dr. Deitra Mayo will be unable to place AV fistula due to small caliber vessels plan for AV graft placement early next week.   LOS: 2 Sherril Croon @TODAY @8 :27 AM

## 2019-08-10 NOTE — Progress Notes (Signed)
Patient has been accepted at [facility] on a MWF schedule with a seat time of 12:30pm. He needs to arrive 20 minutes early to all appointments. On his first day of treatment, he needs to arrive at 11:30am to sign intake paperwork.  Renal Navigator notified Nephrologist/Dr. Justin Mend. Renal Navigator spoke with patient who states understanding of his schedule and appreciation.  William [name], [place] Renal Navigator [phone]

## 2019-08-10 NOTE — H&P (View-Only) (Signed)
   VASCULAR SURGERY ASSESSMENT & PLAN:   END-STAGE RENAL DISEASE: I have reviewed his vein map which shows that he does not have adequate vein for a fistula.  I have tentatively planned placement of a right arm AV graft on Tuesday as he is left-handed.  If he is discharged before that we can arrange that as an outpatient.  He has a functioning catheter.  ANEMIA: His hemoglobin is 7.  He will likely need transfusion prior to surgery.  SUBJECTIVE:   No complaints this morning.  PHYSICAL EXAM:   Vitals:   08/09/19 1834 08/09/19 2053 08/09/19 2054 08/10/19 0459  BP: (!) 146/80 (!) 161/80  (!) 168/104  Pulse: 94 (!) 106 (!) 105 (!) 102  Resp: 18 19  20   Temp: 97.9 F (36.6 C) 99.4 F (37.4 C)  98 F (36.7 C)  TempSrc: Oral Oral  Oral  SpO2: 96% 91% 92% 98%  Weight:  85 kg    Height:       His catheter site looks fine.  LABS:   Lab Results  Component Value Date   WBC 10.4 08/10/2019   HGB 7.0 (L) 08/10/2019   HCT 21.2 (L) 08/10/2019   MCV 93.4 08/10/2019   PLT 319 08/10/2019   Lab Results  Component Value Date   CREATININE 22.19 (H) 08/10/2019    PROBLEM LIST:    Principal Problem:   ESRD (end stage renal disease) ([place]) Active Problems:   Uncontrolled hypertension   Stroke ([place])   HLD (hyperlipidemia)   Polysubstance abuse ([facility])   Tobacco abuse   CURRENT MEDS:   . amLODipine  10 mg Oral QHS  . aspirin  81 mg Oral Daily  . atorvastatin  40 mg Oral q1800  . carvedilol  25 mg Oral BID WC  . Chlorhexidine Gluconate Cloth  6 each Topical Q0600  . clopidogrel  75 mg Oral Daily  . darbepoetin (ARANESP) injection - DIALYSIS  100 mcg Intravenous Q Wed-HD  . docusate sodium  100 mg Oral BID  . heparin  5,000 Units Subcutaneous Q8H  . hydrALAZINE  100 mg Oral Q8H  . minoxidil  2.5 mg Oral BID  . sevelamer carbonate  800 mg Oral TID [facility]    Deitra Mayo Beeper: [id number] Office: [phone] 08/10/2019

## 2019-08-10 NOTE — Progress Notes (Signed)
   VASCULAR SURGERY ASSESSMENT & PLAN:   END-STAGE RENAL DISEASE: I have reviewed his vein map which shows that he does not have adequate vein for a fistula.  I have tentatively planned placement of a right arm AV graft on Tuesday as he is left-handed.  If he is discharged before that we can arrange that as an outpatient.  He has a functioning catheter.  ANEMIA: His hemoglobin is 7.  He will likely need transfusion prior to surgery.  SUBJECTIVE:   No complaints this morning.  PHYSICAL EXAM:   Vitals:   08/09/19 1834 08/09/19 2053 08/09/19 2054 08/10/19 0459  BP: (!) 146/80 (!) 161/80  (!) 168/104  Pulse: 94 (!) 106 (!) 105 (!) 102  Resp: 18 19  20   Temp: 97.9 F (36.6 C) 99.4 F (37.4 C)  98 F (36.7 C)  TempSrc: Oral Oral  Oral  SpO2: 96% 91% 92% 98%  Weight:  85 kg    Height:       His catheter site looks fine.  LABS:   Lab Results  Component Value Date   WBC 10.4 08/10/2019   HGB 7.0 (L) 08/10/2019   HCT 21.2 (L) 08/10/2019   MCV 93.4 08/10/2019   PLT 319 08/10/2019   Lab Results  Component Value Date   CREATININE 22.19 (H) 08/10/2019    PROBLEM LIST:    Principal Problem:   ESRD (end stage renal disease) ([place]) Active Problems:   Uncontrolled hypertension   Stroke ([place])   HLD (hyperlipidemia)   Polysubstance abuse ([facility])   Tobacco abuse   CURRENT MEDS:   . amLODipine  10 mg Oral QHS  . aspirin  81 mg Oral Daily  . atorvastatin  40 mg Oral q1800  . carvedilol  25 mg Oral BID WC  . Chlorhexidine Gluconate Cloth  6 each Topical Q0600  . clopidogrel  75 mg Oral Daily  . darbepoetin (ARANESP) injection - DIALYSIS  100 mcg Intravenous Q Wed-HD  . docusate sodium  100 mg Oral BID  . heparin  5,000 Units Subcutaneous Q8H  . hydrALAZINE  100 mg Oral Q8H  . minoxidil  2.5 mg Oral BID  . sevelamer carbonate  800 mg Oral TID [facility]    Deitra Mayo Beeper: [id number] Office: [phone] 08/10/2019

## 2019-08-10 NOTE — Progress Notes (Signed)
Jayleen Joas to be discharged Home  per MD order. Discussed prescriptions and follow up appointments with the patient. Prescriptions given to patient; medication list explained in detail. Patient verbalized understanding.  Skin clean, dry and intact without evidence of skin break down, no evidence of skin tears noted. IV catheter discontinued intact. Site without signs and symptoms of complications. Dressing and pressure applied. Pt denies pain at the site currently. No complaints noted.  Patient free of lines, drains, and wounds.   An After Visit Summary (AVS) was printed and given to the patient. Patient escorted via wheelchair, and discharged home via private auto.  Shela Commons, RN

## 2019-08-10 NOTE — Progress Notes (Signed)
PROGRESS NOTE    William [name]  [id number] DOB: 02-02-1986 DOA: 08/08/2019 PCP: Patient, No Pcp Per   Brief Narrative:  Patient is a 33 year old male with history of hypertension, hyperlipidemia, polysubstance abuse, CVA, CKD stage V who presented with shortness of breath.  He was found to have creatinine in the range of 27.  Nephrology consulted.  He underwent temporary dialysis catheter placement.  Nephrology following.  Started dialysis on 08/09/19.  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  Thought to be secondary to uncontrolled hypertension and cocaine abuse.  Nephrology following.  He underwent temporary dialysis catheter placement.  Vascular surgery planning for right AV graft placement next week.  Started dialysis on 08/09/19 with plan to start series of dialysis sessions.  Dyspnea: Improved today.Most likely secondary to pulmonary vascular congestion due to volume overload.  Saturating fine on room air.    Chronic normocytic anemia: Associated with ESRD.  Hemoglobin this morning 7.0.  Low iron as per iron studies.  Given IV iron.  Will transfuse if his hemoglobin drops below 7  Uncontrolled hypertension: Poorly controlled hypertension.  Might be noncompliance issues also.  Currently on medicines Norvasc, Coreg, hydralazine, minoxidil.  History of CVA: On aspirin and Plavix.  Hyperlipidemia: Continue Lipitor  Polysubstance abuse: History of cocaine abuse.  Counseled for cessation  Tobacco dependence: Counseled cessation.           DVT prophylaxis: Heparin [place] Code Status: Full Family Communication: None present at the bedside  disposition Plan: Home after nephrology clearance, outpatient dialysis establishment   Consultants: Nephrology,  vascular surgery  Procedures: Temporary dialysis catheter placement  Antimicrobials:  Anti-infectives (From admission, onward)   None      Subjective:  Patient seen and examined the bedside this morning.  Still remains hypertensive today.  Shortness of breath has improved.  Complains of hiccups today.  Thorazine ordered.  Feels better today.  Objective: Vitals:   08/10/19 0905 08/10/19 1330 08/10/19 1343 08/10/19 1400  BP: (!) 185/97 (!) 180/105 (!) 184/108 (!) 173/98  Pulse: (!) 102 92 90 93  Resp: 18     Temp: 98.2 F (36.8 C) 98.2 F (36.8 C)    TempSrc: Oral Oral    SpO2: 96%  98%   Weight:  82.6 kg    Height:        Intake/Output Summary (Last 24 hours) at 08/10/2019 1423 Last data filed at 08/10/2019 1300 Gross per 24 hour  Intake 870.17 ml  Output 2000 ml  Net -1129.83 ml   Filed Weights   08/09/19 1825 08/09/19 2053 08/10/19 1330  Weight: 81.5 kg 85 kg 82.6 kg    Examination:    General exam: Appears calm and comfortable ,Not in distress,average built HEENT:PERRL,Oral mucosa moist, Ear/Nose normal on gross exam Respiratory system: Bilateral decreased air entry on the bases Cardiovascular system: S1 & S2 heard, RRR. No JVD, murmurs, rubs, gallops or clicks.  Temporary dialysis catheter on the right chest Gastrointestinal system: Abdomen is nondistended, soft and nontender. No organomegaly or masses felt. Normal bowel sounds heard. Central nervous system: Alert and oriented. No focal neurological deficits. Extremities: No edema, no clubbing ,no cyanosis, distal peripheral pulses palpable. Skin: No rashes, lesions or ulcers,no icterus ,no pallor MSK: Normal muscle bulk,tone ,  power Psychiatry: Judgement and insight appear normal. Mood & affect appropriate.     Data Reviewed: I have personally reviewed following labs and imaging studies  CBC: Recent Labs  Lab 08/08/19 0023 08/09/19 0609 08/10/19 0535  WBC 7.7 7.7 10.4  NEUTROABS 4.9  --  7.2  HGB  7.3* 7.5* 7.0*  HCT 22.3* 23.1* 21.2*  MCV 94.9 95.1 93.4  PLT 301 311 [id number]   Basic Metabolic Panel: Recent Labs  Lab 08/08/19 0023 08/08/19 0942 08/09/19 0609 08/10/19 0535  NA 135  --  132* 133*  K 4.0  --  4.7 3.9  CL 99  --  99 97*  CO2 15*  --  13* 18*  GLUCOSE 119*  --  138* 118*  BUN 117*  --  128* 100*  CREATININE 27.56*  --  27.86* 22.19*  CALCIUM 9.8  --  9.6 9.5  MG  --  2.4  --   --   PHOS  --  7.7*  --   --    GFR: Estimated Creatinine Clearance: 4.7 mL/min (A) (by C-G formula based on SCr of 22.19 mg/dL (H)). Liver Function Tests: No results for input(s): AST, ALT, ALKPHOS, BILITOT, PROT, ALBUMIN in the last 168 hours. No results for input(s): LIPASE, AMYLASE in the last 168 hours. No results for input(s): AMMONIA in the last 168 hours. Coagulation Profile: No results for input(s): INR, PROTIME in the last 168 hours. Cardiac Enzymes: No results for input(s): CKTOTAL, CKMB, CKMBINDEX, TROPONINI in the last 168 hours. BNP (last 3 results) No results for input(s): PROBNP in the last 8760 hours. HbA1C: No results for input(s): HGBA1C in the last 72 hours. CBG: No results for input(s): GLUCAP in the last 168 hours. Lipid Profile: No results for input(s): CHOL, HDL, LDLCALC, TRIG, CHOLHDL, LDLDIRECT in the last 72 hours. Thyroid Function Tests: No results for input(s): TSH, T4TOTAL, FREET4, T3FREE, THYROIDAB in the last 72 hours. Anemia Panel: Recent Labs    08/08/19 1440  TIBC 311  IRON 30*   Sepsis Labs: No results for input(s): PROCALCITON, LATICACIDVEN in the last 168 hours.  Recent Results (from the past 240 hour(s))  SARS CORONAVIRUS 2 (TAT 6-12 HRS) Nasal Swab Aptima Multi Swab     Status: None   Collection Time: 08/08/19  9:41 AM   Specimen: Aptima Multi Swab; Nasal Swab  Result Value Ref Range Status   SARS Coronavirus 2 NEGATIVE NEGATIVE Final    Comment: (NOTE) SARS-CoV-2 target nucleic acids are NOT DETECTED. The SARS-CoV-2 RNA is  generally detectable in upper and lower respiratory specimens during the acute phase of infection. Negative results do not preclude SARS-CoV-2 infection, do not rule out co-infections with other pathogens, and should not be used as the sole basis for treatment or other patient management decisions. Negative results must be combined with clinical observations, patient history, and epidemiological information. The expected result is Negative. Fact Sheet for Patients: SugarRoll.be Fact Sheet for Healthcare Providers: https://www.woods-mathews.com/ This test is not yet approved or cleared by the [place] FDA and  has been authorized for detection and/or diagnosis of SARS-CoV-2 by FDA under an Emergency Use Authorization (EUA). This EUA will remain  in effect (meaning this test can be used) for the duration of the COVID-19 declaration under Section 56 4(b)(1) of the Act, 21 U.S.C. section 360bbb-3(b)(1), unless the authorization is terminated or revoked sooner. Performed at [facility], [place] [address] [place], [place] [address]  Radiology Studies: Dg Chest Port 1 View  Result Date: 08/08/2019 CLINICAL DATA:  Insertion of dialysis catheter EXAM: PORTABLE CHEST 1 VIEW COMPARISON:  08/08/2019 FINDINGS: Right internal jugular dialysis catheter is been placed with the tip at the cavoatrial junction. No pneumothorax. Cardiomegaly. Layering left effusion with left lower lobe atelectasis or infiltrate. Minimal right base atelectasis. Mild vascular congestion. IMPRESSION: Right dialysis catheter placement with the tip at the cavoatrial junction. No pneumothorax. Cardiomegaly, vascular congestion. Layering left effusion with left base atelectasis or infiltrate. Right base atelectasis. Electronically Signed   By: Rolm Baptise M.D.   On: 08/08/2019 18:31   Dg Fluoro Guide Cv Line-no Report  Result Date: 08/08/2019 Fluoroscopy was  utilized by the requesting physician.  No radiographic interpretation.   Vas [place] Upper Ext Vein Mapping (pre-op Avf)  Result Date: 08/08/2019 UPPER EXTREMITY VEIN MAPPING  Indications: Pre-access. Comparison Study: No prior studies. Performing Technologist: Oliver Hum RVT  Examination Guidelines: A complete evaluation includes B-mode imaging, spectral Doppler, color Doppler, and power Doppler as needed of all accessible portions of each vessel. Bilateral testing is considered an integral part of a complete examination. Limited examinations for reoccurring indications may be performed as noted. +-----------------+-------------+----------+--------+  Right Cephalic    Diameter (cm) Depth (cm) Findings  +-----------------+-------------+----------+--------+  Shoulder              0.22         0.48              +-----------------+-------------+----------+--------+  Prox upper arm        0.17         0.22              +-----------------+-------------+----------+--------+  Mid upper arm         0.19         0.36              +-----------------+-------------+----------+--------+  Dist upper arm        0.27         0.18              +-----------------+-------------+----------+--------+  Antecubital fossa     0.20         0.19              +-----------------+-------------+----------+--------+  Prox forearm          0.13         0.26              +-----------------+-------------+----------+--------+  Mid forearm           0.14         0.23              +-----------------+-------------+----------+--------+  Dist forearm          0.10         0.20              +-----------------+-------------+----------+--------+ +-----------------+-------------+----------+---------+  Right Basilic     Diameter (cm) Depth (cm) Findings   +-----------------+-------------+----------+---------+  Shoulder              0.24         0.72               +-----------------+-------------+----------+---------+  Dist upper arm        0.24          0.37    branching  +-----------------+-------------+----------+---------+  Antecubital fossa     0.07  0.27               +-----------------+-------------+----------+---------+  Prox forearm          0.10         0.16               +-----------------+-------------+----------+---------+  Mid forearm           0.06         0.13               +-----------------+-------------+----------+---------+  Distal forearm        0.06         0.20               +-----------------+-------------+----------+---------+ +-----------------+-------------+----------+--------------+  Left Cephalic     Diameter (cm) Depth (cm)    Findings     +-----------------+-------------+----------+--------------+  Shoulder              0.26         0.43                    +-----------------+-------------+----------+--------------+  Prox upper arm        0.25         0.43                    +-----------------+-------------+----------+--------------+  Mid upper arm         0.17         0.32                    +-----------------+-------------+----------+--------------+  Dist upper arm        0.19         0.27                    +-----------------+-------------+----------+--------------+  Antecubital fossa     0.24         0.20      branching     +-----------------+-------------+----------+--------------+  Prox forearm                               not visualized  +-----------------+-------------+----------+--------------+  Mid forearm           0.14         0.21                    +-----------------+-------------+----------+--------------+  Dist forearm          0.16         0.24                    +-----------------+-------------+----------+--------------+ +-----------------+-------------+----------+---------+  Left Basilic      Diameter (cm) Depth (cm) Findings   +-----------------+-------------+----------+---------+  Shoulder              0.30         0.86               +-----------------+-------------+----------+---------+  Mid upper arm          0.22         0.82               +-----------------+-------------+----------+---------+  Dist upper arm        0.21         0.52    branching  +-----------------+-------------+----------+---------+  Antecubital fossa     0.11  0.17               +-----------------+-------------+----------+---------+  Prox forearm          0.06         0.12               +-----------------+-------------+----------+---------+  Mid forearm           0.08         0.12               +-----------------+-------------+----------+---------+  Distal forearm        0.08         0.18               +-----------------+-------------+----------+---------+ *See table(s) above for measurements and observations.  Diagnosing physician: Deitra Mayo MD Electronically signed by Deitra Mayo MD on 08/08/2019 at 6:36:20 PM.    Final         Scheduled Meds:  amLODipine  10 mg Oral QHS   aspirin  81 mg Oral Daily   atorvastatin  40 mg Oral q1800   carvedilol  25 mg Oral BID WC   Chlorhexidine Gluconate Cloth  6 each Topical Q0600   clopidogrel  75 mg Oral Daily   [START ON 08/11/2019] darbepoetin (ARANESP) injection - DIALYSIS  100 mcg Intravenous Q Fri-HD   docusate sodium  100 mg Oral BID   heparin  5,000 Units Subcutaneous Q8H   hydrALAZINE  100 mg Oral Q8H   minoxidil  2.5 mg Oral BID   sevelamer carbonate  800 mg Oral TID WC   Continuous Infusions:  sodium chloride 10 mL/hr at 08/08/19 1639   sodium chloride     sodium chloride     ferric gluconate (FERRLECIT/NULECIT) IV 125 mg (08/10/19 0945)     LOS: 2 days    Time spent: 35 mins.More than 50% of that time was spent in counseling and/or coordination of care.      Shelly Coss, MD [facility] Pager [phone]  If 7PM-7AM, please contact night-coverage [email or web address] Password [email or web address] 08/10/2019, 2:23 PM

## 2019-08-10 NOTE — Discharge Summary (Signed)
Physician Discharge Summary  William [name] [id number] DOB: 12-Jan-1986 DOA: 08/08/2019  PCP: Patient, No Pcp Per  Admit date: 08/08/2019 Discharge date: 08/10/2019  Admitted From: Home Disposition:  Home  Discharge Condition:Stable CODE STATUS:FULL Diet recommendation: Heart Healthy   Brief/Interim Summary:  Patient is a 33 year old male with history of hypertension, hyperlipidemia, polysubstance abuse, CVA, CKD stage V who presented with shortness of breath.  He was found to have creatinine in the range of 27.  Nephrology consulted.  He underwent temporary dialysis catheter placement.  Nephrology following.  Started dialysis on 08/09/19.  Outpatient dialysis facility has been set up.  He has been cleared by nephrology for discharge.  He will follow-up with vascular surgery for AV graft placement on 08/15/2019.  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  Thought to be secondary to uncontrolled hypertension and cocaine abuse.  Nephrology was following.  He underwent temporary dialysis catheter placement.  Vascular surgery planning for right AV graft placement on 08/15/19.  Started dialysis on 08/09/19 .  Dyspnea: Improved today.Most likely secondary to pulmonary vascular congestion due to volume overload.  Saturating fine on room air.    Chronic normocytic anemia: Associated with ESRD.  Hemoglobin this morning 7.0.  Low iron as per iron studies.  Given IV iron. We recommend to check CBC in a week.   Uncontrolled hypertension: Poorly controlled hypertension.  Might be noncompliance issues also.  Currently on medicines Norvasc, Coreg, hydralazine, minoxidil.  History of CVA: On aspirin and Plavix.  Hyperlipidemia: Continue Lipitor  Polysubstance abuse: History of cocaine abuse.  Counseled for cessation  Tobacco dependence: Counseled cessation.    Discharge Diagnoses:  Principal Problem:   ESRD (end  stage renal disease) ([place]) Active Problems:   Uncontrolled hypertension   Stroke ([place])   HLD (hyperlipidemia)   Polysubstance abuse ([place])   Tobacco abuse    Discharge Instructions  Discharge Instructions    Diet - low sodium heart healthy   Complete by: As directed    Discharge instructions   Complete by: As directed    1)Follow up for dialysis tomorrow at outpatient dialysis facility. 2)Follow up with Dr Gilles Chiquito surgery, for AV graft placement on Tuesday( 08/15/19)   Increase activity slowly   Complete by: As directed      Allergies as of 08/10/2019      Reactions   Reglan [metoclopramide]    Develops akathisia      Medication List    STOP taking these medications   spironolactone 25 MG tablet Commonly known as: ALDACTONE     TAKE these medications   amLODipine 10 MG tablet Commonly known as: NORVASC Take 1 tablet (10 mg total) by mouth at bedtime.   aspirin 81 MG chewable tablet Chew 1 tablet (81 mg total) by mouth daily.   atorvastatin 40 MG tablet Commonly known as: LIPITOR Take 1 tablet (40 mg total) by mouth daily at 6 PM.   carvedilol 25 MG tablet Commonly known as: COREG Take 1 tablet (25 mg total) by mouth 2 (two) times daily with a meal.   chlorproMAZINE 10 MG tablet Commonly known as: THORAZINE Take 1 tablet (10 mg total) by mouth 3 (three) times daily as needed for hiccoughs.   clopidogrel 75 MG tablet Commonly known as: PLAVIX Take 1 tablet (75 mg total) by mouth daily.   hydrALAZINE 100 MG tablet Commonly known as: APRESOLINE Take 1 tablet (100 mg total) by mouth every 8 (  eight) hours.   minoxidil 2.5 MG tablet Commonly known as: LONITEN Take 2.5 mg by mouth 2 (two) times daily.      Follow-up Information    Minus Breeding, MD. Schedule an appointment as soon as possible for a visit in 1 week(s).   Specialty: Cardiology Contact information: [address] [place] [address] [phone]        Angelia Mould, MD Follow up in 4 day(s).   Specialties: Vascular Surgery, Cardiology Why: Call on Monday (08/14/19) Contact information: [address] [place] [address] [phone]          Allergies  Allergen Reactions  . Reglan [Metoclopramide]     Develops akathisia    Consultations:  Nephrology,vascular surgery   Procedures/Studies: Dg Chest 2 View  Result Date: 08/08/2019 CLINICAL DATA:  Shortness of breath EXAM: CHEST - 2 VIEW COMPARISON:  06/21/2019 FINDINGS: Small left pleural effusion. No focal airspace consolidation or pulmonary edema. No pneumothorax. Normal cardiomediastinal contours. IMPRESSION: Small left pleural effusion Electronically Signed   By: Ulyses Jarred M.D.   On: 08/08/2019 00:49   Dg Chest Port 1 View  Result Date: 08/08/2019 CLINICAL DATA:  Insertion of dialysis catheter EXAM: PORTABLE CHEST 1 VIEW COMPARISON:  08/08/2019 FINDINGS: Right internal jugular dialysis catheter is been placed with the tip at the cavoatrial junction. No pneumothorax. Cardiomegaly. Layering left effusion with left lower lobe atelectasis or infiltrate. Minimal right base atelectasis. Mild vascular congestion. IMPRESSION: Right dialysis catheter placement with the tip at the cavoatrial junction. No pneumothorax. Cardiomegaly, vascular congestion. Layering left effusion with left base atelectasis or infiltrate. Right base atelectasis. Electronically Signed   By: Rolm Baptise M.D.   On: 08/08/2019 18:31   Dg Fluoro Guide Cv Line-no Report  Result Date: 08/08/2019 Fluoroscopy was utilized by the requesting physician.  No radiographic interpretation.   Vas [place] Upper Ext Vein Mapping (pre-op Avf)  Result Date: 08/08/2019 UPPER EXTREMITY VEIN MAPPING  Indications: Pre-access. Comparison Study: No prior studies. Performing Technologist: Oliver Hum RVT  Examination Guidelines: A complete evaluation includes B-mode imaging, spectral Doppler, color Doppler, and power Doppler as  needed of all accessible portions of each vessel. Bilateral testing is considered an integral part of a complete examination. Limited examinations for reoccurring indications may be performed as noted. +-----------------+-------------+----------+--------+ Right Cephalic   Diameter (cm)Depth (cm)Findings +-----------------+-------------+----------+--------+ Shoulder             0.22        0.48            +-----------------+-------------+----------+--------+ Prox upper arm       0.17        0.22            +-----------------+-------------+----------+--------+ Mid upper arm        0.19        0.36            +-----------------+-------------+----------+--------+ Dist upper arm       0.27        0.18            +-----------------+-------------+----------+--------+ Antecubital fossa    0.20        0.19            +-----------------+-------------+----------+--------+ Prox forearm         0.13        0.26            +-----------------+-------------+----------+--------+ Mid forearm          0.14  0.23            +-----------------+-------------+----------+--------+ Dist forearm         0.10        0.20            +-----------------+-------------+----------+--------+ +-----------------+-------------+----------+---------+ Right Basilic    Diameter (cm)Depth (cm)Findings  +-----------------+-------------+----------+---------+ Shoulder             0.24        0.72             +-----------------+-------------+----------+---------+ Dist upper arm       0.24        0.37   branching +-----------------+-------------+----------+---------+ Antecubital fossa    0.07        0.27             +-----------------+-------------+----------+---------+ Prox forearm         0.10        0.16             +-----------------+-------------+----------+---------+ Mid forearm          0.06        0.13             +-----------------+-------------+----------+---------+  Distal forearm       0.06        0.20             +-----------------+-------------+----------+---------+ +-----------------+-------------+----------+--------------+ Left Cephalic    Diameter (cm)Depth (cm)   Findings    +-----------------+-------------+----------+--------------+ Shoulder             0.26        0.43                  +-----------------+-------------+----------+--------------+ Prox upper arm       0.25        0.43                  +-----------------+-------------+----------+--------------+ Mid upper arm        0.17        0.32                  +-----------------+-------------+----------+--------------+ Dist upper arm       0.19        0.27                  +-----------------+-------------+----------+--------------+ Antecubital fossa    0.24        0.20     branching    +-----------------+-------------+----------+--------------+ Prox forearm                            not visualized +-----------------+-------------+----------+--------------+ Mid forearm          0.14        0.21                  +-----------------+-------------+----------+--------------+ Dist forearm         0.16        0.24                  +-----------------+-------------+----------+--------------+ +-----------------+-------------+----------+---------+ Left Basilic     Diameter (cm)Depth (cm)Findings  +-----------------+-------------+----------+---------+ Shoulder             0.30        0.86             +-----------------+-------------+----------+---------+ Mid upper arm        0.22        0.82             +-----------------+-------------+----------+---------+  Dist upper arm       0.21        0.52   branching +-----------------+-------------+----------+---------+ Antecubital fossa    0.11        0.17             +-----------------+-------------+----------+---------+ Prox forearm         0.06        0.12              +-----------------+-------------+----------+---------+ Mid forearm          0.08        0.12             +-----------------+-------------+----------+---------+ Distal forearm       0.08        0.18             +-----------------+-------------+----------+---------+ *See table(s) above for measurements and observations.  Diagnosing physician: Deitra Mayo MD Electronically signed by Deitra Mayo MD on 08/08/2019 at 6:36:20 PM.    Final        Subjective:  Patient seen and examined the bedside this morning.  Saturating fine on room air.  Mildly hypertensive.  Dyspnea is much better today.  Complains of hiccups  Discharge Exam: Vitals:   08/10/19 1530 08/10/19 1613  BP: (!) 166/96 (!) 170/98  Pulse: 98 98  Resp:  18  Temp:  98.2 F (36.8 C)  SpO2:  95%   Vitals:   08/10/19 1430 08/10/19 1500 08/10/19 1530 08/10/19 1613  BP: (!) 177/97 (!) 166/97 (!) 166/96 (!) 170/98  Pulse: 98 98 98 98  Resp:    18  Temp:    98.2 F (36.8 C)  TempSrc:    Oral  SpO2:    95%  Weight:    80.3 kg  Height:        General: Pt is alert, awake, not in acute distress Cardiovascular: RRR, S1/S2 +, no rubs, no gallops Respiratory: CTA bilaterally, no wheezing, no rhonchi Abdominal: Soft, NT, ND, bowel sounds + Extremities: no edema, no cyanosis    The results of significant diagnostics from this hospitalization (including imaging, microbiology, ancillary and laboratory) are listed below for reference.     Microbiology: Recent Results (from the past 240 hour(s))  SARS CORONAVIRUS 2 (TAT 6-12 HRS) Nasal Swab Aptima Multi Swab     Status: None   Collection Time: 08/08/19  9:41 AM   Specimen: Aptima Multi Swab; Nasal Swab  Result Value Ref Range Status   SARS Coronavirus 2 NEGATIVE NEGATIVE Final    Comment: (NOTE) SARS-CoV-2 target nucleic acids are NOT DETECTED. The SARS-CoV-2 RNA is generally detectable in upper and lower respiratory specimens during the acute phase of  infection. Negative results do not preclude SARS-CoV-2 infection, do not rule out co-infections with other pathogens, and should not be used as the sole basis for treatment or other patient management decisions. Negative results must be combined with clinical observations, patient history, and epidemiological information. The expected result is Negative. Fact Sheet for Patients: SugarRoll.be Fact Sheet for Healthcare Providers: https://www.woods-mathews.com/ This test is not yet approved or cleared by the [place] FDA and  has been authorized for detection and/or diagnosis of SARS-CoV-2 by FDA under an Emergency Use Authorization (EUA). This EUA will remain  in effect (meaning this test can be used) for the duration of the COVID-19 declaration under Section 56 4(b)(1) of the Act, 21 U.S.C. section 360bbb-3(b)(1), unless the authorization is terminated or revoked sooner. Performed at [facility], [place] [address], [place], [place] [address]      Labs: BNP (last 3 results) No results for input(s): BNP in the last 8760 hours. Basic Metabolic Panel: Recent Labs  Lab 08/08/19 0023 08/08/19 0942 08/09/19 0609 08/10/19 0535  NA 135  --  132* 133*  K 4.0  --  4.7 3.9  CL 99  --  99 97*  CO2 15*  --  13* 18*  GLUCOSE 119*  --  138* 118*  BUN 117*  --  128* 100*  CREATININE 27.56*  --  27.86* 22.19*  CALCIUM 9.8  --  9.6 9.5  MG  --  2.4  --   --   PHOS  --  7.7*  --   --    Liver Function Tests: No results for input(s): AST, ALT, ALKPHOS, BILITOT, PROT, ALBUMIN in the last 168 hours. No results for input(s): LIPASE, AMYLASE in the last 168 hours. No results for input(s): AMMONIA in the last 168 hours. CBC: Recent Labs  Lab 08/08/19 0023 08/09/19 0609 08/10/19 0535  WBC 7.7 7.7 10.4  NEUTROABS 4.9  --  7.2  HGB 7.3* 7.5* 7.0*  HCT 22.3* 23.1* 21.2*  MCV 94.9 95.1 93.4  PLT 301 311 319   Cardiac Enzymes: No results  for input(s): CKTOTAL, CKMB, CKMBINDEX, TROPONINI in the last 168 hours. BNP: Invalid input(s): POCBNP CBG: No results for input(s): GLUCAP in the last 168 hours. D-Dimer No results for input(s): DDIMER in the last 72 hours. Hgb A1c No results for input(s): HGBA1C in the last 72 hours. Lipid Profile No results for input(s): CHOL, HDL, LDLCALC, TRIG, CHOLHDL, LDLDIRECT in the last 72 hours. Thyroid function studies No results for input(s): TSH, T4TOTAL, T3FREE, THYROIDAB in the last 72 hours.  Invalid input(s): FREET3 Anemia work up Recent Labs    08/08/19 1440  TIBC 311  IRON 30*   Urinalysis    Component Value Date/Time   COLORURINE YELLOW 08/08/2019 1440   APPEARANCEUR HAZY (A) 08/08/2019 1440   LABSPEC 1.012 08/08/2019 1440   PHURINE 5.0 08/08/2019 1440   GLUCOSEU NEGATIVE 08/08/2019 1440   HGBUR NEGATIVE 08/08/2019 1440   [place] 08/08/2019 1440   KETONESUR NEGATIVE 08/08/2019 1440   PROTEINUR >=300 (A) 08/08/2019 1440   NITRITE NEGATIVE 08/08/2019 [place] 08/08/2019 1440   Sepsis Labs Invalid input(s): PROCALCITONIN,  WBC,  LACTICIDVEN Microbiology Recent Results (from the past 240 hour(s))  SARS CORONAVIRUS 2 (TAT 6-12 HRS) Nasal Swab Aptima Multi Swab     Status: None   Collection Time: 08/08/19  9:41 AM   Specimen: Aptima Multi Swab; Nasal Swab  Result Value Ref Range Status   SARS Coronavirus 2 NEGATIVE NEGATIVE Final    Comment: (NOTE) SARS-CoV-2 target nucleic acids are NOT DETECTED. The SARS-CoV-2 RNA is generally detectable in upper and lower respiratory specimens during the acute phase of infection. Negative results do not preclude SARS-CoV-2 infection, do not rule out co-infections with other pathogens, and should not be used as the sole basis for treatment or other patient management decisions. Negative results must be combined with clinical observations, patient history, and epidemiological information. The  expected result is Negative. Fact Sheet for Patients: SugarRoll.be Fact Sheet for Healthcare Providers: https://www.woods-mathews.com/ This test is not yet approved or cleared by the [place] FDA and  has been authorized for detection and/or diagnosis of SARS-CoV-2 by FDA under an Emergency Use Authorization (EUA). This EUA will remain  in effect (meaning  this test can be used) for the duration of the COVID-19 declaration under Section 56 4(b)(1) of the Act, 21 U.S.C. section 360bbb-3(b)(1), unless the authorization is terminated or revoked sooner. Performed at [facility], [place] [address] [place], [place] [address]     Please note: You were cared for by a hospitalist during your hospital stay. Once you are discharged, your primary care physician will handle any further medical issues. Please note that NO REFILLS for any discharge medications will be authorized once you are discharged, as it is imperative that you return to your primary care physician (or establish a relationship with a primary care physician if you do not have one) for your post hospital discharge needs so that they can reassess your need for medications and monitor your lab values.    Time coordinating discharge: 40 minutes  SIGNED:   Shelly Coss, MD  [facility] 08/10/2019, 4:55 PM Pager [phone]  If 7PM-7AM, please contact night-coverage [email or web address] Password [email or web address]

## 2019-08-11 ENCOUNTER — Encounter: Payer: Self-pay | Admitting: *Deleted

## 2019-08-11 ENCOUNTER — Other Ambulatory Visit: Payer: Self-pay | Admitting: *Deleted

## 2019-08-14 ENCOUNTER — Encounter (HOSPITAL_COMMUNITY): Payer: Self-pay

## 2019-08-14 ENCOUNTER — Other Ambulatory Visit: Payer: Self-pay

## 2019-08-14 ENCOUNTER — Ambulatory Visit: Payer: Medicaid Other | Admitting: Speech Pathology

## 2019-08-14 ENCOUNTER — Ambulatory Visit: Payer: Medicaid Other | Admitting: Physical Therapy

## 2019-08-14 NOTE — Anesthesia Preprocedure Evaluation (Addendum)
Anesthesia Evaluation  Patient identified by MRN, date of birth, ID band Patient awake    Reviewed: Allergy & Precautions, NPO status , Patient's Chart, lab work & pertinent test results, reviewed documented beta blocker date and time   History of Anesthesia Complications Negative for: history of anesthetic complications  Airway Mallampati: III  TM Distance: >3 FB Neck ROM: Full    Dental  (+) Teeth Intact, Dental Advisory Given   Pulmonary former smoker,    breath sounds clear to auscultation       Cardiovascular hypertension, Pt. on medications and Pt. on home beta blockers  Rhythm:Regular Rate:Normal     Neuro/Psych CVA (06/2019), No Residual Symptoms    GI/Hepatic negative GI ROS, (+)     substance abuse  cocaine use and marijuana use,   Endo/Other  negative endocrine ROS  Renal/GU ESRF and DialysisRenal disease (Started HD 08/09/19)     Musculoskeletal negative musculoskeletal ROS (+)   Abdominal Normal abdominal exam  (+)   Peds  Hematology  (+) anemia , Hgb 7.0 on 08/10/19   Anesthesia Other Findings Day of surgery medications reviewed with the patient.  Reproductive/Obstetrics                            Anesthesia Physical Anesthesia Plan  ASA: III  Anesthesia Plan: MAC   Post-op Pain Management:    Induction: Intravenous  PONV Risk Score and Plan: 2 and Treatment may vary due to age or medical condition, Propofol infusion and Ondansetron  Airway Management Planned: Simple Face Mask and Natural Airway  Additional Equipment: None  Intra-op Plan:   Post-operative Plan:   Informed Consent: I have reviewed the patients History and Physical, chart, labs and discussed the procedure including the risks, benefits and alternatives for the proposed anesthesia with the patient or authorized representative who has indicated his/her understanding and acceptance.     Dental  advisory given  Plan Discussed with: CRNA  Anesthesia Plan Comments:       Anesthesia Quick Evaluation

## 2019-08-14 NOTE — Progress Notes (Signed)
Spoke w/patient and patient's wife, Dorris Maton, w/patient's consent for pre-op call.  Patient denies cardiac hx, DM.  Discharged from [facility] on 08/10/19.  Recent COVID-19 test 08/08/19 - Negative; patient will need repeat COVID-19 test on DOS.  Patient denies any s/sx of COVID. Patient instructed NOT to take Plavix the morning of surgery.

## 2019-08-15 ENCOUNTER — Encounter (HOSPITAL_COMMUNITY)
Admission: RE | Disposition: A | Discharge status: Home / Self Care | Payer: Self-pay | Source: Home / Self Care | Attending: Vascular Surgery

## 2019-08-15 ENCOUNTER — Ambulatory Visit (HOSPITAL_COMMUNITY): Payer: Medicaid Other | Admitting: Anesthesiology

## 2019-08-15 ENCOUNTER — Ambulatory Visit (HOSPITAL_COMMUNITY)
Admission: RE | Admit: 2019-08-15 | Discharge: 2019-08-15 | Disposition: A | Discharge status: Home / Self Care | Payer: Medicaid Other | Attending: Vascular Surgery | Admitting: Vascular Surgery

## 2019-08-15 ENCOUNTER — Encounter (HOSPITAL_COMMUNITY): Payer: Self-pay

## 2019-08-15 DIAGNOSIS — Z7982 Long term (current) use of aspirin: Secondary | ICD-10-CM | POA: Insufficient documentation

## 2019-08-15 DIAGNOSIS — D649 Anemia, unspecified: Secondary | ICD-10-CM | POA: Diagnosis not present

## 2019-08-15 DIAGNOSIS — Z87891 Personal history of nicotine dependence: Secondary | ICD-10-CM | POA: Diagnosis not present

## 2019-08-15 DIAGNOSIS — Z20828 Contact with and (suspected) exposure to other viral communicable diseases: Secondary | ICD-10-CM | POA: Diagnosis not present

## 2019-08-15 DIAGNOSIS — I120 Hypertensive chronic kidney disease with stage 5 chronic kidney disease or end stage renal disease: Secondary | ICD-10-CM | POA: Diagnosis not present

## 2019-08-15 DIAGNOSIS — Z992 Dependence on renal dialysis: Secondary | ICD-10-CM | POA: Diagnosis not present

## 2019-08-15 DIAGNOSIS — N186 End stage renal disease: Secondary | ICD-10-CM | POA: Diagnosis not present

## 2019-08-15 DIAGNOSIS — Z79899 Other long term (current) drug therapy: Secondary | ICD-10-CM | POA: Insufficient documentation

## 2019-08-15 DIAGNOSIS — Z8673 Personal history of transient ischemic attack (TIA), and cerebral infarction without residual deficits: Secondary | ICD-10-CM | POA: Diagnosis not present

## 2019-08-15 DIAGNOSIS — Z7902 Long term (current) use of antithrombotics/antiplatelets: Secondary | ICD-10-CM | POA: Diagnosis not present

## 2019-08-15 DIAGNOSIS — E785 Hyperlipidemia, unspecified: Secondary | ICD-10-CM | POA: Insufficient documentation

## 2019-08-15 HISTORY — PX: AV FISTULA PLACEMENT: SHX1204

## 2019-08-15 HISTORY — DX: Anemia, unspecified: D64.9

## 2019-08-15 LAB — POCT I-STAT 4, (NA,K, GLUC, HGB,HCT)
Glucose, Bld: 92 mg/dL (ref 70–99)
HCT: 22 % — ABNORMAL LOW (ref 39.0–52.0)
Hemoglobin: 7.5 g/dL — ABNORMAL LOW (ref 13.0–17.0)
Potassium: 3.8 mmol/L (ref 3.5–5.1)
Sodium: 135 mmol/L (ref 135–145)

## 2019-08-15 LAB — SARS CORONAVIRUS 2 BY RT PCR (HOSPITAL ORDER, PERFORMED IN ~~LOC~~ HOSPITAL LAB): SARS Coronavirus 2: NEGATIVE

## 2019-08-15 SURGERY — INSERTION OF ARTERIOVENOUS (AV) GORE-TEX GRAFT ARM
Anesthesia: Monitor Anesthesia Care | Laterality: Right

## 2019-08-15 MED ORDER — MIDAZOLAM HCL 5 MG/5ML IJ SOLN
INTRAMUSCULAR | Status: DC | PRN
  Administered 2019-08-15: 2 mg via INTRAVENOUS

## 2019-08-15 MED ORDER — HYDROCODONE-ACETAMINOPHEN 5-325 MG PO TABS: 1.0000 | ORAL_TABLET | Freq: Four times a day (QID) | ORAL | 0 refills | Status: DC | PRN

## 2019-08-15 MED ORDER — FENTANYL CITRATE (PF) 100 MCG/2ML IJ SOLN
INTRAMUSCULAR | Status: DC | PRN
  Administered 2019-08-15: 25 ug via INTRAVENOUS

## 2019-08-15 MED ORDER — ACETAMINOPHEN 325 MG PO TABS: 325.0000 mg | ORAL_TABLET | Freq: Once | ORAL | Status: DC | PRN

## 2019-08-15 MED ORDER — PHENYLEPHRINE 40 MCG/ML (10ML) SYRINGE FOR IV PUSH (FOR BLOOD PRESSURE SUPPORT)
PREFILLED_SYRINGE | INTRAVENOUS | Status: AC
  Filled 2019-08-15: qty 10

## 2019-08-15 MED ORDER — THROMBIN 20000 UNITS EX SOLR
CUTANEOUS | Status: DC | PRN
  Administered 2019-08-15: 20 mL via TOPICAL

## 2019-08-15 MED ORDER — FENTANYL CITRATE (PF) 100 MCG/2ML IJ SOLN
INTRAMUSCULAR | Status: AC
  Filled 2019-08-15: qty 2

## 2019-08-15 MED ORDER — ACETAMINOPHEN 10 MG/ML IV SOLN
INTRAVENOUS | Status: AC
  Filled 2019-08-15: qty 100

## 2019-08-15 MED ORDER — LACTATED RINGERS IV SOLN: INTRAVENOUS | Status: DC

## 2019-08-15 MED ORDER — SODIUM CHLORIDE 0.9 % IV SOLN
INTRAVENOUS | Status: DC | PRN
  Administered 2019-08-15: 500 mL

## 2019-08-15 MED ORDER — PROTAMINE SULFATE 10 MG/ML IV SOLN
INTRAVENOUS | Status: AC
  Filled 2019-08-15: qty 10

## 2019-08-15 MED ORDER — LIDOCAINE HCL (PF) 1 % IJ SOLN
INTRAMUSCULAR | Status: AC
  Filled 2019-08-15: qty 30

## 2019-08-15 MED ORDER — ACETAMINOPHEN 160 MG/5ML PO SOLN: 325.0000 mg | Freq: Once | ORAL | Status: DC | PRN

## 2019-08-15 MED ORDER — CEFAZOLIN SODIUM-DEXTROSE 2-3 GM-%(50ML) IV SOLR
INTRAVENOUS | Status: DC | PRN
  Administered 2019-08-15: 2 g via INTRAVENOUS

## 2019-08-15 MED ORDER — ONDANSETRON HCL 4 MG/2ML IJ SOLN
INTRAMUSCULAR | Status: AC
  Filled 2019-08-15: qty 2

## 2019-08-15 MED ORDER — 0.9 % SODIUM CHLORIDE (POUR BTL) OPTIME
TOPICAL | Status: DC | PRN
  Administered 2019-08-15: 1000 mL

## 2019-08-15 MED ORDER — SODIUM CHLORIDE 0.9 % IV SOLN
INTRAVENOUS | Status: AC
  Filled 2019-08-15: qty 1.2

## 2019-08-15 MED ORDER — PROPOFOL 10 MG/ML IV BOLUS
INTRAVENOUS | Status: AC
  Filled 2019-08-15: qty 20

## 2019-08-15 MED ORDER — ACETAMINOPHEN 10 MG/ML IV SOLN
1000.0000 mg | Freq: Once | INTRAVENOUS | Status: DC | PRN
  Administered 2019-08-15: 1000 mg via INTRAVENOUS

## 2019-08-15 MED ORDER — THROMBIN (RECOMBINANT) 20000 UNITS EX SOLR
CUTANEOUS | Status: AC
  Filled 2019-08-15: qty 20000

## 2019-08-15 MED ORDER — SODIUM CHLORIDE 0.9 % IV SOLN
INTRAVENOUS | Status: DC
  Administered 2019-08-15 (×2): via INTRAVENOUS

## 2019-08-15 MED ORDER — LIDOCAINE HCL (PF) 1 % IJ SOLN
INTRAMUSCULAR | Status: DC | PRN
  Administered 2019-08-15: 30 mL

## 2019-08-15 MED ORDER — LIDOCAINE-EPINEPHRINE (PF) 1 %-1:200000 IJ SOLN
INTRAMUSCULAR | Status: AC
  Filled 2019-08-15: qty 30

## 2019-08-15 MED ORDER — ONDANSETRON HCL 4 MG/2ML IJ SOLN
INTRAMUSCULAR | Status: DC | PRN
  Administered 2019-08-15: 4 mg via INTRAVENOUS

## 2019-08-15 MED ORDER — HEPARIN SODIUM (PORCINE) 1000 UNIT/ML IJ SOLN
INTRAMUSCULAR | Status: DC | PRN
  Administered 2019-08-15: 7000 [IU] via INTRAVENOUS

## 2019-08-15 MED ORDER — LIDOCAINE-EPINEPHRINE (PF) 1 %-1:200000 IJ SOLN
INTRAMUSCULAR | Status: DC | PRN
  Administered 2019-08-15: 30 mL

## 2019-08-15 MED ORDER — MIDAZOLAM HCL 2 MG/2ML IJ SOLN
INTRAMUSCULAR | Status: AC
  Filled 2019-08-15: qty 2

## 2019-08-15 MED ORDER — FENTANYL CITRATE (PF) 100 MCG/2ML IJ SOLN
25.0000 ug | INTRAMUSCULAR | Status: DC | PRN
  Administered 2019-08-15: 25 ug via INTRAVENOUS

## 2019-08-15 MED ORDER — PROPOFOL 10 MG/ML IV BOLUS
INTRAVENOUS | Status: DC | PRN
  Administered 2019-08-15: 20 mg via INTRAVENOUS
  Administered 2019-08-15 (×2): 40 mg via INTRAVENOUS

## 2019-08-15 MED ORDER — PROPOFOL 500 MG/50ML IV EMUL
INTRAVENOUS | Status: DC | PRN
  Administered 2019-08-15: 100 ug/kg/min via INTRAVENOUS

## 2019-08-15 MED ORDER — PROTAMINE SULFATE 10 MG/ML IV SOLN
INTRAVENOUS | Status: DC | PRN
  Administered 2019-08-15: 40 mg via INTRAVENOUS

## 2019-08-15 MED ORDER — FENTANYL CITRATE (PF) 250 MCG/5ML IJ SOLN
INTRAMUSCULAR | Status: AC
  Filled 2019-08-15: qty 5

## 2019-08-15 MED ORDER — HEMOSTATIC AGENTS (NO CHARGE) OPTIME
TOPICAL | Status: DC | PRN
  Administered 2019-08-15: 1 via TOPICAL

## 2019-08-15 MED ORDER — HEPARIN SODIUM (PORCINE) 1000 UNIT/ML IJ SOLN
INTRAMUSCULAR | Status: AC
  Filled 2019-08-15: qty 1

## 2019-08-15 SURGICAL SUPPLY — 35 items
ARMBAND PINK RESTRICT EXTREMIT (MISCELLANEOUS) ×6 IMPLANT
CANISTER SUCT 3000ML PPV (MISCELLANEOUS) ×3 IMPLANT
CANNULA VESSEL 3MM 2 BLNT TIP (CANNULA) ×3 IMPLANT
CLIP VESOCCLUDE MED 6/CT (CLIP) ×3 IMPLANT
CLIP VESOCCLUDE SM WIDE 6/CT (CLIP) ×3 IMPLANT
COVER WAND RF STERILE (DRAPES) ×3 IMPLANT
DECANTER SPIKE VIAL GLASS SM (MISCELLANEOUS) ×3 IMPLANT
DERMABOND ADVANCED (GAUZE/BANDAGES/DRESSINGS) ×2
DERMABOND ADVANCED .7 DNX12 (GAUZE/BANDAGES/DRESSINGS) ×1 IMPLANT
DRAPE SURG 17X23 STRL (DRAPES) ×3 IMPLANT
ELECT REM PT RETURN 9FT ADLT (ELECTROSURGICAL) ×3
ELECTRODE REM PT RTRN 9FT ADLT (ELECTROSURGICAL) ×1 IMPLANT
GLOVE BIO SURGEON STRL SZ7.5 (GLOVE) ×3 IMPLANT
GLOVE BIOGEL PI IND STRL 6.5 (GLOVE) ×3 IMPLANT
GLOVE BIOGEL PI IND STRL 8 (GLOVE) ×1 IMPLANT
GLOVE BIOGEL PI INDICATOR 6.5 (GLOVE) ×6
GLOVE BIOGEL PI INDICATOR 8 (GLOVE) ×2
GLOVE ECLIPSE 6.5 STRL STRAW (GLOVE) ×3 IMPLANT
GOWN STRL REUS W/ TWL LRG LVL3 (GOWN DISPOSABLE) ×3 IMPLANT
GOWN STRL REUS W/TWL LRG LVL3 (GOWN DISPOSABLE) ×6
GRAFT GORETEX STRT 4-7X45 (Vascular Products) ×3 IMPLANT
KIT BASIN OR (CUSTOM PROCEDURE TRAY) ×3 IMPLANT
KIT TURNOVER KIT B (KITS) ×3 IMPLANT
NS IRRIG 1000ML POUR BTL (IV SOLUTION) ×3 IMPLANT
PACK CV ACCESS (CUSTOM PROCEDURE TRAY) ×3 IMPLANT
PAD ARMBOARD 7.5X6 YLW CONV (MISCELLANEOUS) ×6 IMPLANT
SPONGE SURGIFOAM ABS GEL 100 (HEMOSTASIS) ×3 IMPLANT
SUT PROLENE 6 0 BV (SUTURE) ×18 IMPLANT
SUT VIC AB 3-0 SH 27 (SUTURE) ×4
SUT VIC AB 3-0 SH 27X BRD (SUTURE) ×2 IMPLANT
SUT VICRYL 4-0 PS2 18IN ABS (SUTURE) ×6 IMPLANT
SYR TOOMEY 50ML (SYRINGE) IMPLANT
TOWEL GREEN STERILE (TOWEL DISPOSABLE) ×3 IMPLANT
UNDERPAD 30X30 (UNDERPADS AND DIAPERS) ×3 IMPLANT
WATER STERILE IRR 1000ML POUR (IV SOLUTION) ×3 IMPLANT

## 2019-08-15 NOTE — Discharge Instructions (Signed)
° °  [facility] ° °Discharge Instructions ° °AV Fistula or Graft Surgery for Dialysis Access ° °Please refer to the following instructions for your post-procedure care. Your surgeon or physician assistant will discuss any changes with you. ° °Activity ° °You may drive the day following your surgery, if you are comfortable and no longer taking prescription pain medication. Resume full activity as the soreness in your incision resolves. ° °Bathing/Showering ° °You may shower after you go home. Keep your incision dry for 48 hours. Do not soak in a bathtub, hot tub, or swim until the incision heals completely. You may not shower if you have a hemodialysis catheter. ° °Incision Care ° °Clean your incision with mild soap and water after 48 hours. Pat the area dry with a clean towel. You do not need a bandage unless otherwise instructed. Do not apply any ointments or creams to your incision. You may have skin glue on your incision. Do not peel it off. It will come off on its own in about one week. Your arm may swell a bit after surgery. To reduce swelling use pillows to elevate your arm so it is above your heart. Your doctor will tell you if you need to lightly wrap your arm with an ACE bandage. ° °Diet ° °Resume your normal diet. There are not special food restrictions following this procedure. In order to heal from your surgery, it is CRITICAL to get adequate nutrition. Your body requires vitamins, minerals, and protein. Vegetables are the best source of vitamins and minerals. Vegetables also provide the perfect balance of protein. Processed food has little nutritional value, so try to avoid this. ° °Medications ° °Resume taking all of your medications. If your incision is causing pain, you may take over-the counter pain relievers such as acetaminophen (Tylenol). If you were prescribed a stronger pain medication, please be aware these medications can cause nausea and constipation. Prevent  nausea by taking the medication with a snack or meal. Avoid constipation by drinking plenty of fluids and eating foods with high amount of fiber, such as fruits, vegetables, and grains. Do not take Tylenol if you are taking prescription pain medications. ° ° ° ° °Follow up °Your surgeon may want to see you in the office following your access surgery. If so, this will be arranged at the time of your surgery. ° °Please call us immediately for any of the following conditions: ° °Increased pain, redness, drainage (pus) from your incision site °Fever of 101 degrees or higher °Severe or worsening pain at your incision site °Hand pain or numbness. ° °Reduce your risk of vascular disease: ° °Stop smoking. If you would like help, call [facility] at 1-800-QUIT-NOW (1-800-784-8669) or [place] at 336-586-4000 ° °Manage your cholesterol °Maintain a desired weight °Control your diabetes °Keep your blood pressure down ° °Dialysis ° °It will take several weeks to several months for your new dialysis access to be ready for use. Your surgeon will determine when it is OK to use it. Your nephrologist will continue to direct your dialysis. You can continue to use your Permcath until your new access is ready for use. ° °If you have any questions, please call the office at [phone]. ° °

## 2019-08-15 NOTE — Op Note (Signed)
    NAME: Ajith Gaby    MRN: [id number] DOB: 04/27/1986    DATE OF OPERATION: 08/15/2019  PREOP DIAGNOSIS:    End-stage renal disease  POSTOP DIAGNOSIS:    Same  PROCEDURE:    Right forearm AV graft (4-7 mm PTFE graft)  SURGEON: Judeth Cornfield. Scot Dock, MD, FACS  ASSIST: Arlee Muslim, PA  ANESTHESIA: Local with sedation  EBL: Minimal  INDICATIONS:    William [name] is a 33 y.o. male who presents for new access.  He has a functioning dialysis catheter.  He dialyzes on Monday Wednesdays and Fridays.  FINDINGS:   3-1/2 mm brachial vein 4 mm brachial artery  TECHNIQUE:   The patient was taken to the operating room and was sedated by anesthesia.  The right upper extremity was prepped and draped in the usual sterile fashion.  After the skin was anesthetized with 1% lidocaine, a transverse incision was made just below the antecubital level.  Here the brachial artery and adjacent brachial vein were dissected free.  Using 1 distal counterincision a 4-7 mm PTFE graft was tunneled in a loop fashion in the forearm.  The arterial aspect of the graft was tunneled along the radial aspect of the forearm.  The venous aspect of the graft was tunneled along the ulnar aspect of the forearm.  The patient was heparinized.  The brachial artery was clamped proximally distally and a longitudinal arteriotomy was made.  A segment of the 4 mm into the graft was excised, the graft spatulated and sewn end-to-side to the brachial artery using continuous 6-0 Prolene suture.  The graft simple to the appropriate length for anastomosis to the brachial vein.  The vein was ligated distally and spatulated proximally.  The graft was cut the appropriate length spatulated and sewn into into the vein using continuous 6-0 Prolene suture.  At the completion there was an excellent thrill in the graft and a palpable radial pulse.  Hemostasis was obtained in the wound.  The wound was closed with a deep layer of 3-0 Vicryl  and the skin closed with 4-0 Vicryl.  The distal counterincision was closed with a deep layer of 3-0 Vicryl and the skin closed with 4-0 Vicryl.  Dermabond was applied.  The patient tolerated procedure well was transferred to the recovery room in stable condition.  All needle and sponge counts were correct.  Deitra Mayo, MD, FACS [facility]  DATE OF DICTATION:   08/15/2019

## 2019-08-15 NOTE — Anesthesia Postprocedure Evaluation (Signed)
Anesthesia Post Note  Patient: William [name]  Procedure(s) Performed: INSERTION OF ARTERIOVENOUS (AV) GORE-TEX GRAFT ARM. Using 4-66m STRETCH Goretex. (Right )     Patient location during evaluation: PACU Anesthesia Type: MAC Level of consciousness: awake and alert Pain management: pain level controlled Vital Signs Assessment: post-procedure vital signs reviewed and stable Respiratory status: spontaneous breathing, nonlabored ventilation, respiratory function stable and patient connected to nasal cannula oxygen Cardiovascular status: stable and blood pressure returned to baseline Postop Assessment: no apparent nausea or vomiting Anesthetic complications: no    Last Vitals:  Vitals:   08/15/19 1430 08/15/19 1445  BP: (!) 150/100 (!) 147/88  Pulse: 86 81  Resp: 15 14  Temp:    SpO2: 98% 100%    Last Pain:  Vitals:   08/15/19 1445  TempSrc:   PainSc: 0-No pain                 KEffie Berkshire

## 2019-08-15 NOTE — Transfer of Care (Signed)
Immediate Anesthesia Transfer of Care Note  Patient: William [name]  Procedure(s) Performed: INSERTION OF ARTERIOVENOUS (AV) GORE-TEX GRAFT ARM. Using 4-86m STRETCH Goretex. (Right )  Patient Location: PACU  Anesthesia Type:MAC  Level of Consciousness: awake and drowsy  Airway & Oxygen Therapy: Patient Spontanous Breathing and Patient connected to nasal cannula oxygen  Post-op Assessment: Report given to RN and Post -op Vital signs reviewed and stable  Post vital signs: Reviewed and stable  Last Vitals:  Vitals Value Taken Time  BP 125/87 08/15/19 1342  Temp 36.9 C 08/15/19 1345  Pulse 90 08/15/19 1351  Resp 21 08/15/19 1351  SpO2 98 % 08/15/19 1351  Vitals shown include unvalidated device data.  Last Pain:  Vitals:   08/15/19 1345  TempSrc:   PainSc: Asleep         Complications: No apparent anesthesia complications

## 2019-08-15 NOTE — Interval H&P Note (Signed)
History and Physical Interval Note:  08/15/2019 11:06 AM  William [name]  has presented today for surgery, with the diagnosis of end stage renal disease.  The various methods of treatment have been discussed with the patient and family. After consideration of risks, benefits and other options for treatment, the patient has consented to  Procedure(s): INSERTION OF ARTERIOVENOUS (AV) GORE-TEX GRAFT ARM (Right) as a surgical intervention.  The patient's history has been reviewed, patient examined, no change in status, stable for surgery.  I have reviewed the patient's chart and labs.  Questions were answered to the patient's satisfaction.     Deitra Mayo

## 2019-08-15 NOTE — Anesthesia Procedure Notes (Signed)
Procedure Name: MAC Date/Time: 08/15/2019 11:35 AM Performed by: Inda Coke, CRNA Pre-anesthesia Checklist: Patient identified, Emergency Drugs available, Suction available and Patient being monitored Patient Re-evaluated:Patient Re-evaluated prior to induction Oxygen Delivery Method: Simple face mask

## 2019-08-16 ENCOUNTER — Ambulatory Visit: Payer: Medicaid Other | Attending: Physical Medicine & Rehabilitation | Admitting: Speech Pathology

## 2019-08-16 ENCOUNTER — Encounter (HOSPITAL_COMMUNITY): Payer: Self-pay | Admitting: Vascular Surgery

## 2019-08-16 ENCOUNTER — Ambulatory Visit: Payer: Medicaid Other | Admitting: Cardiology

## 2019-08-16 MED FILL — Thrombin (Recombinant) For Soln 20000 Unit: CUTANEOUS | Qty: 1 | Status: AC

## 2019-08-18 ENCOUNTER — Telehealth: Payer: Self-pay

## 2019-08-18 ENCOUNTER — Other Ambulatory Visit: Payer: Self-pay

## 2019-08-18 ENCOUNTER — Encounter (HOSPITAL_COMMUNITY): Payer: Self-pay | Admitting: Emergency Medicine

## 2019-08-18 ENCOUNTER — Inpatient Hospital Stay (HOSPITAL_COMMUNITY)
Admission: EM | Admit: 2019-08-18 | Discharge: 2019-08-20 | DRG: 314 | Disposition: A | Discharge status: Home / Self Care | Payer: Medicaid Other | Attending: Internal Medicine | Admitting: Internal Medicine

## 2019-08-18 ENCOUNTER — Emergency Department (HOSPITAL_COMMUNITY): Payer: Medicaid Other

## 2019-08-18 DIAGNOSIS — E785 Hyperlipidemia, unspecified: Secondary | ICD-10-CM | POA: Diagnosis not present

## 2019-08-18 DIAGNOSIS — E876 Hypokalemia: Secondary | ICD-10-CM | POA: Diagnosis present

## 2019-08-18 DIAGNOSIS — I120 Hypertensive chronic kidney disease with stage 5 chronic kidney disease or end stage renal disease: Secondary | ICD-10-CM | POA: Diagnosis present

## 2019-08-18 DIAGNOSIS — Z7902 Long term (current) use of antithrombotics/antiplatelets: Secondary | ICD-10-CM

## 2019-08-18 DIAGNOSIS — N186 End stage renal disease: Secondary | ICD-10-CM | POA: Diagnosis not present

## 2019-08-18 DIAGNOSIS — Z8042 Family history of malignant neoplasm of prostate: Secondary | ICD-10-CM

## 2019-08-18 DIAGNOSIS — Z888 Allergy status to other drugs, medicaments and biological substances status: Secondary | ICD-10-CM

## 2019-08-18 DIAGNOSIS — T827XXA Infection and inflammatory reaction due to other cardiac and vascular devices, implants and grafts, initial encounter: Principal | ICD-10-CM | POA: Diagnosis present

## 2019-08-18 DIAGNOSIS — Z8673 Personal history of transient ischemic attack (TIA), and cerebral infarction without residual deficits: Secondary | ICD-10-CM

## 2019-08-18 DIAGNOSIS — I119 Hypertensive heart disease without heart failure: Secondary | ICD-10-CM | POA: Diagnosis present

## 2019-08-18 DIAGNOSIS — Z8249 Family history of ischemic heart disease and other diseases of the circulatory system: Secondary | ICD-10-CM

## 2019-08-18 DIAGNOSIS — Z79899 Other long term (current) drug therapy: Secondary | ICD-10-CM

## 2019-08-18 DIAGNOSIS — T859XXA Unspecified complication of internal prosthetic device, implant and graft, initial encounter: Secondary | ICD-10-CM

## 2019-08-18 DIAGNOSIS — F1910 Other psychoactive substance abuse, uncomplicated: Secondary | ICD-10-CM | POA: Diagnosis present

## 2019-08-18 DIAGNOSIS — I10 Essential (primary) hypertension: Secondary | ICD-10-CM | POA: Diagnosis present

## 2019-08-18 DIAGNOSIS — A419 Sepsis, unspecified organism: Secondary | ICD-10-CM | POA: Diagnosis present

## 2019-08-18 DIAGNOSIS — Z20828 Contact with and (suspected) exposure to other viral communicable diseases: Secondary | ICD-10-CM | POA: Diagnosis present

## 2019-08-18 DIAGNOSIS — Y832 Surgical operation with anastomosis, bypass or graft as the cause of abnormal reaction of the patient, or of later complication, without mention of misadventure at the time of the procedure: Secondary | ICD-10-CM | POA: Diagnosis present

## 2019-08-18 DIAGNOSIS — R110 Nausea: Secondary | ICD-10-CM | POA: Diagnosis present

## 2019-08-18 DIAGNOSIS — Z87891 Personal history of nicotine dependence: Secondary | ICD-10-CM

## 2019-08-18 DIAGNOSIS — Z833 Family history of diabetes mellitus: Secondary | ICD-10-CM

## 2019-08-18 DIAGNOSIS — D631 Anemia in chronic kidney disease: Secondary | ICD-10-CM | POA: Diagnosis present

## 2019-08-18 DIAGNOSIS — R509 Fever, unspecified: Secondary | ICD-10-CM

## 2019-08-18 DIAGNOSIS — F1410 Cocaine abuse, uncomplicated: Secondary | ICD-10-CM | POA: Diagnosis present

## 2019-08-18 DIAGNOSIS — D649 Anemia, unspecified: Secondary | ICD-10-CM

## 2019-08-18 DIAGNOSIS — Z992 Dependence on renal dialysis: Secondary | ICD-10-CM

## 2019-08-18 DIAGNOSIS — M7989 Other specified soft tissue disorders: Secondary | ICD-10-CM

## 2019-08-18 DIAGNOSIS — I639 Cerebral infarction, unspecified: Secondary | ICD-10-CM | POA: Diagnosis present

## 2019-08-18 DIAGNOSIS — Z720 Tobacco use: Secondary | ICD-10-CM | POA: Diagnosis present

## 2019-08-18 LAB — COMPREHENSIVE METABOLIC PANEL
ALT: 5 U/L (ref 0–44)
AST: 17 U/L (ref 15–41)
Albumin: 3.5 g/dL (ref 3.5–5.0)
Alkaline Phosphatase: 49 U/L (ref 38–126)
Anion gap: 13 (ref 5–15)
BUN: 11 mg/dL (ref 6–20)
CO2: 30 mmol/L (ref 22–32)
Calcium: 8.7 mg/dL — ABNORMAL LOW (ref 8.9–10.3)
Chloride: 95 mmol/L — ABNORMAL LOW (ref 98–111)
Creatinine, Ser: 5.69 mg/dL — ABNORMAL HIGH (ref 0.61–1.24)
GFR calc Af Amer: 14 mL/min — ABNORMAL LOW (ref >60)
GFR calc non Af Amer: 12 mL/min — ABNORMAL LOW (ref >60)
Glucose, Bld: 127 mg/dL — ABNORMAL HIGH (ref 70–99)
Potassium: 3.4 mmol/L — ABNORMAL LOW (ref 3.5–5.1)
Sodium: 138 mmol/L (ref 135–145)
Total Bilirubin: 0.6 mg/dL (ref 0.3–1.2)
Total Protein: 6.9 g/dL (ref 6.5–8.1)

## 2019-08-18 LAB — CBC WITH DIFFERENTIAL/PLATELET
Abs Immature Granulocytes: 0.06 10*3/uL (ref 0.00–0.07)
Basophils Absolute: 0 10*3/uL (ref 0.0–0.1)
Basophils Relative: 0 %
Eosinophils Absolute: 0.1 10*3/uL (ref 0.0–0.5)
Eosinophils Relative: 1 %
HCT: 21.3 % — ABNORMAL LOW (ref 39.0–52.0)
Hemoglobin: 6.4 g/dL — CL (ref 13.0–17.0)
Immature Granulocytes: 1 %
Lymphocytes Relative: 12 %
Lymphs Abs: 1.3 10*3/uL (ref 0.7–4.0)
MCH: 30 pg (ref 26.0–34.0)
MCHC: 30 g/dL (ref 30.0–36.0)
MCV: 100 fL (ref 80.0–100.0)
Monocytes Absolute: 1.2 10*3/uL — ABNORMAL HIGH (ref 0.1–1.0)
Monocytes Relative: 10 %
Neutro Abs: 8.7 10*3/uL — ABNORMAL HIGH (ref 1.7–7.7)
Neutrophils Relative %: 76 %
Platelets: 296 10*3/uL (ref 150–400)
RBC: 2.13 MIL/uL — ABNORMAL LOW (ref 4.22–5.81)
RDW: 13.5 % (ref 11.5–15.5)
WBC: 11.4 10*3/uL — ABNORMAL HIGH (ref 4.0–10.5)
nRBC: 0 % (ref 0.0–0.2)

## 2019-08-18 LAB — LACTIC ACID, PLASMA: Lactic Acid, Venous: 1.1 mmol/L (ref 0.5–1.9)

## 2019-08-18 MED ORDER — MORPHINE SULFATE (PF) 4 MG/ML IV SOLN
4.0000 mg | Freq: Once | INTRAVENOUS | Status: AC
  Administered 2019-08-19: 4 mg via INTRAVENOUS
  Filled 2019-08-18: qty 1

## 2019-08-18 MED ORDER — PIPERACILLIN-TAZOBACTAM 3.375 G IVPB 30 MIN
3.3750 g | Freq: Once | INTRAVENOUS | Status: AC
  Administered 2019-08-19: 3.375 g via INTRAVENOUS
  Filled 2019-08-18: qty 50

## 2019-08-18 MED ORDER — SODIUM CHLORIDE 0.9% IV SOLUTION
Freq: Once | INTRAVENOUS | Status: AC
  Administered 2019-08-19: 01:00:00 via INTRAVENOUS

## 2019-08-18 NOTE — ED Triage Notes (Signed)
Patient reports worsening swelling at right forearm AV fistula with drainage onset this morning with low grade fever , hemodialysis this morning .

## 2019-08-18 NOTE — Telephone Encounter (Signed)
Dialysis center called stating patients arm with new fistula was swollen down to hand, hand was cold and painful, and there was redness to the upper part of the incision. The MD at dialysis was going to prescribe antibiotic. Appt scheduled to come in to see PA was made for 08/22/19 at 12:45 with instructions to go to ED if symptoms worsened especially given the holiday weekend.

## 2019-08-18 NOTE — ED Notes (Signed)
[phone] call wife when she can come back

## 2019-08-18 NOTE — ED Notes (Signed)
Hgb 6.4 per lab.  Kerrin Mo RN and Dr. Zenia Resides notified.  Working on getting pt treatment room.

## 2019-08-18 NOTE — ED Provider Notes (Signed)
Medical screening examination/treatment/procedure(s) were conducted as a shared visit with non-physician practitioner(s) and myself.  I personally evaluated the patient during the encounter.    33 year old male presents with right arm swelling.  Recently had dialysis graft placed.  On exam he has obvious infection here.  Arm is swollen warm to the touch.  Is febrile here.  Will start on antibiotics and consult vascular surgery   Lacretia Leigh, MD 08/18/19 2302

## 2019-08-18 NOTE — ED Provider Notes (Signed)
[facility] EMERGENCY DEPARTMENT Provider Note   CSN: [id number] Arrival date & time: 08/18/19  1827     History   Chief Complaint Chief Complaint  Patient presents with  . Fistula Infected/Swelling    HPI William [name] is a 33 y.o. male with a hx of anemia, ESRD (Dialysis Monday Wednesday Friday), hyperlipidemia, hypertension, stroke presents to the Emergency Department complaining of gradual, persistent, progressively worsening swelling and pain to the right forearm onset 2 days ago.  Patient had AV fistula graft inserted by Dr. Doren Custard on 08/15/2019.  He reports he has had subjective fevers and chills for the last several days.  Patient reports today that his right hand became painful and he developed paresthesias.  Patient has been on dialysis x2 weeks with a Hickman catheter.  He reports feeling short of breath and very fatigued.  He reports he was told at dialysis today that his hemoglobin was low but he is not sure how low.  He also reports that he was given an unknown antibiotic during dialysis today.  Records reviewed.  It appears the dialysis center called the vascular center and stated they were going to prescribe antibiotics but it is unclear what was given.  Opening or alleviating factors.  Patient has not taken any medications for fever control.  He is taking Vicodin for pain control with only minimal relief.     The history is provided by the patient and medical records. No language interpreter was used.    Past Medical History:  Diagnosis Date  . Anemia   . Drug abuse, cocaine type ([place]) 06/23/2019  . ESRD (end stage renal disease) ([place]) 08/08/2019  . Hyperlipidemia   . Hypertension   . Stroke [facility]) 06/2019    Patient Active Problem List   Diagnosis Date Noted  . Infection of AV graft for dialysis ([place]) 08/18/2019  . Sepsis ([place]) 08/18/2019  . Anemia in ESRD (end-stage renal disease) ([place]) 08/18/2019  . Hypokalemia 08/18/2019  . ESRD on dialysis  ([place]) 08/08/2019  . Anemia of chronic disease 07/08/2019  . AKI (acute kidney injury) ([place]) 07/08/2019  . HTN (hypertension) 07/07/2019  . Stroke ([place]) 07/07/2019  . HLD (hyperlipidemia) 07/07/2019  . Polysubstance abuse ([place]) 07/07/2019  . Tobacco abuse 07/07/2019  . Uncontrolled hypertension   . Acute kidney injury ([place])   . PRES (posterior reversible encephalopathy syndrome) 06/29/2019  . 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 Hypertensive emergency 06/22/2019  . Acute renal failure [facility])     Past Surgical History:  Procedure Laterality Date  . AV FISTULA PLACEMENT Right 08/15/2019   Procedure: INSERTION OF ARTERIOVENOUS (AV) GORE-TEX GRAFT ARM. Using 4-75mm STRETCH Goretex.;  Surgeon: Angelia Mould, MD;  [facility];  Service: Vascular;  Laterality: Right;  . INSERTION OF DIALYSIS CATHETER Right 08/08/2019   Procedure: INSERTION OF right internal jugular DIALYSIS CATHETER;  Surgeon: Angelia Mould, MD;  Location: [facility];  Service: Vascular;  Laterality: Right;  . RADIOLOGY WITH ANESTHESIA N/A 06/23/2019   Procedure: MRI OF VESSELS;  Surgeon: Radiologist, Medication, MD;  Location: [facility];  Service: Radiology;  Laterality: N/A;        Home Medications    Prior to Admission medications   Medication Sig Start Date End Date Taking? Authorizing Provider  amLODipine (NORVASC) 10 MG tablet Take 1 tablet (10 mg total) by mouth at bedtime. 07/08/19  Yes Debbe Odea, MD  aspirin EC 325 MG tablet Take 325 mg by mouth daily as  needed for moderate pain.   Yes [provider]  atorvastatin (LIPITOR) 40 MG tablet Take 1 tablet (40 mg total) by mouth daily at 6 PM. 06/29/19  Yes Adhikari, Tamsen Meek, MD  carvedilol (COREG) 25 MG tablet Take 1 tablet (25 mg total) by mouth 2 (two) times daily with a meal. 06/29/19  Yes Adhikari, Tamsen Meek, MD  chlorproMAZINE (THORAZINE) 10 MG tablet Take 1 tablet (10 mg total) by mouth 3 (three) times daily as needed for  hiccoughs. 08/10/19  Yes Shelly Coss, MD  hydrALAZINE (APRESOLINE) 100 MG tablet Take 1 tablet (100 mg total) by mouth every 8 (eight) hours. 07/08/19  Yes Debbe Odea, MD  HYDROcodone-acetaminophen (NORCO) 5-325 MG tablet Take 1 tablet by mouth every 6 (six) hours as needed for moderate pain. 08/15/19  Yes Dagoberto Ligas, PA-C  minoxidil (LONITEN) 2.5 MG tablet Take 2.5 mg by mouth 2 (two) times daily. 07/13/19  Yes [provider]  nicotine (NICODERM CQ - DOSED IN MG/24 HR) 7 mg/24hr patch Place 7 mg onto the skin daily.   Yes [provider]  spironolactone (ALDACTONE) 25 MG tablet Take 25 mg by mouth daily.   Yes [provider]  aspirin 81 MG chewable tablet Chew 1 tablet (81 mg total) by mouth daily. Patient not taking: Reported on 08/18/2019 06/30/19   Shelly Coss, MD  clopidogrel (PLAVIX) 75 MG tablet Take 1 tablet (75 mg total) by mouth daily. Patient not taking: Reported on 08/18/2019 07/09/19   Debbe Odea, MD    Family History Family History  Problem Relation Age of Onset  . Diabetes Sister   . Diabetes Maternal Uncle   . Hypertension Father   . Hypertension Paternal Grandfather   . Prostate cancer Paternal Grandfather   . Diabetes Sister   . Renal Disease Neg Hx     Social History Social History   Tobacco Use  . Smoking status: Former Smoker    Packs/day: 0.00    Years: 20.00    Pack years: 0.00    Quit date: 06/2019    Years since quitting: 0.1  . Smokeless tobacco: Never Used  Substance Use Topics  . Alcohol use: Not Currently    Comment: 2-3 beers a day; stopped with 7/20 hospitalization  . Drug use: Yes    Types: Marijuana, Cocaine, Benzodiazepines    Comment: denies use since 7/20 hospitalization     Allergies   Reglan [metoclopramide]   Review of Systems Review of Systems  Constitutional: Positive for fatigue and fever. Negative for appetite change, diaphoresis and unexpected weight change.  HENT: Negative for mouth  sores.   Eyes: Negative for visual disturbance.  Respiratory: Positive for shortness of breath. Negative for cough, chest tightness and wheezing.   Cardiovascular: Negative for chest pain.  Gastrointestinal: Negative for abdominal pain, constipation, diarrhea, nausea and vomiting.  Endocrine: Negative for polydipsia, polyphagia and polyuria.  Genitourinary: Negative for dysuria, frequency, hematuria and urgency.  Musculoskeletal: Positive for arthralgias (right arm). Negative for back pain and neck stiffness.  Skin: Positive for color change. Negative for rash.  Allergic/Immunologic: Negative for immunocompromised state.  Neurological: Positive for weakness (generalized). Negative for syncope, light-headedness and headaches.  Hematological: Does not bruise/bleed easily.  Psychiatric/Behavioral: Negative for sleep disturbance. The patient is not nervous/anxious.      Physical Exam Updated Vital Signs BP 120/66   Pulse (!) 121   Temp 100.2 F (37.9 C) (Oral)   Resp 18   SpO2 96%   Physical Exam Vitals signs  and nursing note reviewed.  Constitutional:      General: He is not in acute distress.    Appearance: He is not diaphoretic.  HENT:     Head: Normocephalic.  Eyes:     General: No scleral icterus.    Conjunctiva/sclera: Conjunctivae normal.  Neck:     Musculoskeletal: Normal range of motion.  Cardiovascular:     Rate and Rhythm: Regular rhythm. Tachycardia present.     Pulses:          Radial pulses are 1+ on the right side and 2+ on the left side.     Comments: Capillary refill less than 3 seconds in the left upper extremity, 5 seconds in the right upper extremity.  Radial pulse is palpable in the right upper extremity. Pulmonary:     Effort: No tachypnea, accessory muscle usage, prolonged expiration, respiratory distress or retractions.     Breath sounds: No stridor.     Comments: Equal chest rise. No increased work of breathing. Abdominal:     General: There is no  distension.     Palpations: Abdomen is soft.     Tenderness: There is no abdominal tenderness. There is no guarding or rebound.  Musculoskeletal:     Right forearm: He exhibits tenderness, swelling and edema.     Comments: Moves all extremities equally and without difficulty. No lateral lower peripheral edema.  No tenderness to the calves. Graft site on the right forearm with significant erythema and edema.  Hot to touch.  Blebs noted at the incision site.  Palpable thrill.  Edema extends from the wrist up to the right forearm.    Skin:    General: Skin is warm and dry.     Capillary Refill: Capillary refill takes less than 2 seconds.  Neurological:     Mental Status: He is alert.     GCS: GCS eye subscore is 4. GCS verbal subscore is 5. GCS motor subscore is 6.     Comments: Speech is clear and goal oriented. Somewhat subjectively decreased sensation in the right hand, normal in the left hand.  Right grip strength 4/5 due to pain in the forearm; 5/5 in the left hand.  Psychiatric:        Mood and Affect: Mood normal.      ED Treatments / Results  Labs (all labs ordered are listed, but only abnormal results are displayed) Labs Reviewed  CBC WITH DIFFERENTIAL/PLATELET - Abnormal; Notable for the following components:      Result Value   WBC 11.4 (*)    RBC 2.13 (*)    Hemoglobin 6.4 (*)    HCT 21.3 (*)    Neutro Abs 8.7 (*)    Monocytes Absolute 1.2 (*)    All other components within normal limits  COMPREHENSIVE METABOLIC PANEL - Abnormal; Notable for the following components:   Potassium 3.4 (*)    Chloride 95 (*)    Glucose, Bld 127 (*)    Creatinine, Ser 5.69 (*)    Calcium 8.7 (*)    GFR calc non Af Amer 12 (*)    GFR calc Af Amer 14 (*)    All other components within normal limits  CULTURE, BLOOD (ROUTINE X 2)  CULTURE, BLOOD (ROUTINE X 2)  URINE CULTURE  SARS CORONAVIRUS 2 (HOSPITAL ORDER, [place] [facility])  LACTIC ACID, PLASMA  LACTIC ACID,  PLASMA  APTT  PROTIME-INR  URINALYSIS, ROUTINE W REFLEX MICROSCOPIC  VITAMIN B12  FOLATE  IRON AND TIBC  FERRITIN  RETICULOCYTES  TYPE AND SCREEN  PREPARE RBC (CROSSMATCH)    EKG None  Radiology No results found.  Procedures .Critical Care Performed by: Abigail Butts, PA-C Authorized by: Abigail Butts, PA-C   Critical care provider statement:    Critical care time (minutes):  45   Critical care time was exclusive of:  Separately billable procedures and treating other patients and teaching time   Critical care was necessary to treat or prevent imminent or life-threatening deterioration of the following conditions:  Sepsis and circulatory failure   Critical care was time spent personally by me on the following activities:  Discussions with consultants, evaluation of patient's response to treatment, examination of patient, ordering and performing treatments and interventions, ordering and review of laboratory studies, ordering and review of radiographic studies, pulse oximetry, re-evaluation of patient's condition, obtaining history from patient or surrogate and review of old charts   I assumed direction of critical care for this patient from another provider in my specialty: no     (including critical care time)  Medications Ordered in ED Medications  0.9 %  sodium chloride infusion (Manually program via Guardrails IV Fluids) (has no administration in time range)  morphine 4 MG/ML injection 4 mg (has no administration in time range)  piperacillin-tazobactam (ZOSYN) IVPB 3.375 g (has no administration in time range)     Initial Impression / Assessment and Plan / ED Course  I have reviewed the triage vital signs and the nursing notes.  Pertinent labs & imaging results that were available during my care of the patient were reviewed by me and considered in my medical decision making (see chart for details).  Clinical Course as of Aug 17 2344  Fri Aug 18, 2019   2237 Low - pt with fatigue/SOB  Hemoglobin(!!): 6.4 [HM]  2237 tachycardia  Pulse Rate(!): 121 [HM]  2237 Low grade fever  Temp: 100.2 F (37.9 C) [HM]  2301 INSERTION OF ARTERIOVENOUS (AV) GORE-TEX GRAFT ARM. Using 4-71mm STRETCH Goretex. 9/1 by Dr. Scot Dock   [HM]  2310 Discussed with Dr. Trula Slade, vascular.  He recommends broad-spectrum antibiotics and he will evaluate in the morning.  He advises this may be a Gore-Tex reaction versus infection however given fever will treat as infection initially.  Discussed exam findings including paresthesias and decreased capillary refill of the right hand but palpable radial pulse.   [HM]  2320 Discussed possible vancomycin administration at dialysis and concern for AV graft infection with ED pharmacist.  They recommend treatment with Zosyn at this time.   [HM]  2324 The patient was discussed with and seen by Dr. Zenia Resides who agrees with the treatment plan.    [HM]  2343 Discussed with Dr. Blaine Hamper who will admit   [HM]    Clinical Course User Index [HM] Makaria Poarch, Jarrett Soho, PA-C       Patient presents with several concerns.  Initial concern swelling and pain to the right forearm graft site.  Concerning for infection versus graft reaction.  Patient given antibiotics at dialysis however he does not know what he was given.  Suspect it was likely vancomycin.  Discussed with ED pharmacist who recommends Zosyn at this time.  Blood cultures and lactic acid pending.  Patient with low-grade fever and tachycardia.  Patient also with significant anemia with hemoglobin now at 6.4.  Patient's baseline hemoglobin appears to be 7.5.  He reports fatigue and shortness of breath.  We will give 1 unit of packed  red blood cells.  Patient consented to this.  Patient will need admission for transfusion and for fever/possible infection of the graft.    Final Clinical Impressions(s) / ED Diagnoses   Final diagnoses:  Arm swelling  Complications due to device, implant, and  graft, initial encounter  Symptomatic anemia  Fever, unspecified fever cause    ED Discharge Orders    None       Loni Muse Gwenlyn Perking 08/19/19 0748    Lacretia Leigh, MD 08/22/19 [id number]

## 2019-08-18 NOTE — H&P (Signed)
History and Physical    Orange Gelin [id number] DOB: 10-Jan-1986 DOA: 08/18/2019  Referring MD/NP/PA:   PCP: Patient, No Pcp Per   Patient coming from:  The patient is coming from home.  At baseline, pt is independent for most of ADL.        Chief Complaint: right arm pain and swelling, fever and chills  HPI: William [name] is a 33 y.o. male with medical history significant of hypertension, hyperlipidemia, stroke, ESRD-HD (MWF), anemia, polysubstance abuse, cocaine abuse, tobacco abuse, who presents with right arm pain, swelling, fever and chills.  Pt had AV graft placed on 08/15/19 by Dr. Doren Custard. He states that he has been having pain and swelling in right arm in the past 3 days, which has been progressively worsening. The pain is constant, sharp, 10 out of 10 in severity, nonradiating.  He also has fever and chills, generalized weakness. He also has mild numbness in right hand. He denies chest pain, shortness of breath, cough.  No nausea, vomiting, diarrhea, abdominal pain, symptoms of UTI or unilateral weakness.  Patient states that he did dialysis on Friday morning.  He reports that he was given an unknown antibiotic during dialysis.  It appears the dialysis center called the vascular center and stated they were going to prescribe antibiotics but it is unclear what was given.  ED Course: pt was found to have WBC 11.4, lactic acid 1.1, potassium 3.4, bicarbonate of 30, creatinine 5.69, BUN 11, pending COVID-19, temperature 100.2, tachycardia, oxygen saturation 96% on room air, blood pressure 160/66.  Pt is admitted to tele bed as inpt. Dr. Trula Slade of [facility] was consulted   Chest x-ray showed: 1. Cardiomegaly with vascular congestion. 2. Bilateral effusions with lower lobe airspace opacities.   Review of Systems:   General: has fevers, chills, no body weight gain, has fatigue HEENT: no blurry vision, hearing changes or sore throat Respiratory: no dyspnea, coughing, wheezing CV: no  chest pain, no palpitations GI: no nausea, vomiting, abdominal pain, diarrhea, constipation GU: no dysuria, burning on urination, increased urinary frequency, hematuria  Ext: no leg edema Neuro: no unilateral weakness, numbness, or tingling, no vision change or hearing loss Skin: no rash. Right arm swelling and pain. MSK: No muscle spasm, no deformity, no limitation of range of movement in spin Heme: No easy bruising.  Travel history: No recent long distant travel.  Allergy:  Allergies  Allergen Reactions  . Reglan [Metoclopramide] Nausea And Vomiting    Develops akathisia    Past Medical History:  Diagnosis Date  . Anemia   . Drug abuse, cocaine type ([place]) 06/23/2019  . ESRD (end stage renal disease) ([place]) 08/08/2019  . Hyperlipidemia   . Hypertension   . Stroke [facility]) 06/2019    Past Surgical History:  Procedure Laterality Date  . AV FISTULA PLACEMENT Right 08/15/2019   Procedure: INSERTION OF ARTERIOVENOUS (AV) GORE-TEX GRAFT ARM. Using 4-37mm STRETCH Goretex.;  Surgeon: Angelia Mould, MD;  Location: [facility] OR;  Service: Vascular;  Laterality: Right;  . INSERTION OF DIALYSIS CATHETER Right 08/08/2019   Procedure: INSERTION OF right internal jugular DIALYSIS CATHETER;  Surgeon: Angelia Mould, MD;  Location: [place];  Service: Vascular;  Laterality: Right;  . RADIOLOGY WITH ANESTHESIA N/A 06/23/2019   Procedure: MRI OF VESSELS;  Surgeon: Radiologist, Medication, MD;  Location: [place];  Service: Radiology;  Laterality: N/A;    Social History:  reports that he quit smoking about 2 months ago. He smoked 0.00 packs per day for 20.00  years. He has never used smokeless tobacco. He reports previous alcohol use. He reports current drug use. Drugs: Marijuana, Cocaine, and Benzodiazepines.  Family History:  Family History  Problem Relation Age of Onset  . Diabetes Sister   . Diabetes Maternal Uncle   . Hypertension Father   . Hypertension Paternal Grandfather   . Prostate  cancer Paternal Grandfather   . Diabetes Sister   . Renal Disease Neg Hx      Prior to Admission medications   Medication Sig Start Date End Date Taking? Authorizing Provider  amLODipine (NORVASC) 10 MG tablet Take 1 tablet (10 mg total) by mouth at bedtime. 07/08/19  Yes Debbe Odea, MD  aspirin EC 325 MG tablet Take 325 mg by mouth daily as needed for moderate pain.   Yes [provider]  atorvastatin (LIPITOR) 40 MG tablet Take 1 tablet (40 mg total) by mouth daily at 6 PM. 06/29/19  Yes Adhikari, Tamsen Meek, MD  carvedilol (COREG) 25 MG tablet Take 1 tablet (25 mg total) by mouth 2 (two) times daily with a meal. 06/29/19  Yes Adhikari, Tamsen Meek, MD  chlorproMAZINE (THORAZINE) 10 MG tablet Take 1 tablet (10 mg total) by mouth 3 (three) times daily as needed for hiccoughs. 08/10/19  Yes Shelly Coss, MD  hydrALAZINE (APRESOLINE) 100 MG tablet Take 1 tablet (100 mg total) by mouth every 8 (eight) hours. 07/08/19  Yes Debbe Odea, MD  HYDROcodone-acetaminophen (NORCO) 5-325 MG tablet Take 1 tablet by mouth every 6 (six) hours as needed for moderate pain. 08/15/19  Yes Dagoberto Ligas, PA-C  minoxidil (LONITEN) 2.5 MG tablet Take 2.5 mg by mouth 2 (two) times daily. 07/13/19  Yes [provider]  nicotine (NICODERM CQ - DOSED IN MG/24 HR) 7 mg/24hr patch Place 7 mg onto the skin daily.   Yes [provider]  spironolactone (ALDACTONE) 25 MG tablet Take 25 mg by mouth daily.   Yes [provider]  aspirin 81 MG chewable tablet Chew 1 tablet (81 mg total) by mouth daily. Patient not taking: Reported on 08/18/2019 06/30/19   Shelly Coss, MD  clopidogrel (PLAVIX) 75 MG tablet Take 1 tablet (75 mg total) by mouth daily. Patient not taking: Reported on 08/18/2019 07/09/19   Debbe Odea, MD    Physical Exam: Vitals:   08/18/19 1922 08/19/19 0200 08/19/19 0202  BP: 120/66 (!) 210/118   Pulse: (!) 121 (!) 119   Resp: 18    Temp: 100.2 F (37.9 C)  99 F (37.2 C)   TempSrc: Oral    SpO2: 96% 98%    General: Not in acute distress HEENT:       Eyes: PERRL, EOMI, no scleral icterus.       ENT: No discharge from the ears and nose, no pharynx injection, no tonsillar enlargement.        Neck: No JVD, no bruit, no mass felt. Heme: No neck lymph node enlargement. Cardiac: S1/S2, RRR, No murmurs, No gallops or rubs. Respiratory: Good air movement bilaterally. No rales, wheezing, rhonchi or rubs. GI: Soft, nondistended, nontender, no rebound pain, no organomegaly, BS present. GU: No hematuria Ext: No pitting leg edema bilaterally. 2+DP/PT pulse bilaterally. Musculoskeletal: No joint deformities, No joint redness or warmth, no limitation of ROM in spin. Skin: No rashes. Pt has erythema, swelling, tenderness and warmth over AV graft site, swelling extends from the wrist up to the right forearm.   Neuro: Alert, oriented X3, cranial nerves II-XII grossly intact, moves all extremities normally. Muscle  strength 5/5 in all extremities, sensation to light touch intact. Brachial reflex 2+ bilaterally. Knee reflex 1+ bilaterally. Negative Babinski's sign. Normal finger to nose test. Psych: Patient is not psychotic, no suicidal or hemocidal ideation.  Labs on Admission: I have personally reviewed following labs and imaging studies  CBC: Recent Labs  Lab 08/15/19 0837 08/18/19 2021  WBC  --  11.4*  NEUTROABS  --  8.7*  HGB 7.5* 6.4*  HCT 22.0* 21.3*  MCV  --  100.0  PLT  --  0000000   Basic Metabolic Panel: Recent Labs  Lab 08/15/19 0837 08/18/19 2021  NA 135 138  K 3.8 3.4*  CL  --  95*  CO2  --  30  GLUCOSE 92 127*  BUN  --  11  CREATININE  --  5.69*  CALCIUM  --  8.7*   GFR: Estimated Creatinine Clearance: 18.3 mL/min (A) (by C-G formula based on SCr of 5.69 mg/dL (H)). Liver Function Tests: Recent Labs  Lab 08/18/19 2021  AST 17  ALT 5  ALKPHOS 49  BILITOT 0.6  PROT 6.9  ALBUMIN 3.5   No results for input(s): LIPASE, AMYLASE in the last  168 hours. No results for input(s): AMMONIA in the last 168 hours. Coagulation Profile: Recent Labs  Lab 08/18/19 2358  INR 1.3*   Cardiac Enzymes: No results for input(s): CKTOTAL, CKMB, CKMBINDEX, TROPONINI in the last 168 hours. BNP (last 3 results) No results for input(s): PROBNP in the last 8760 hours. HbA1C: No results for input(s): HGBA1C in the last 72 hours. CBG: No results for input(s): GLUCAP in the last 168 hours. Lipid Profile: No results for input(s): CHOL, HDL, LDLCALC, TRIG, CHOLHDL, LDLDIRECT in the last 72 hours. Thyroid Function Tests: No results for input(s): TSH, T4TOTAL, FREET4, T3FREE, THYROIDAB in the last 72 hours. Anemia Panel: Recent Labs    08/18/19 2358  VITAMINB12 338  FOLATE 10.1  FERRITIN 266  TIBC 239*  IRON 18*  RETICCTPCT 6.5*   Urine analysis:    Component Value Date/Time   COLORURINE YELLOW 08/08/2019 1440   APPEARANCEUR HAZY (A) 08/08/2019 1440   LABSPEC 1.012 08/08/2019 1440   PHURINE 5.0 08/08/2019 1440   GLUCOSEU NEGATIVE 08/08/2019 1440   HGBUR NEGATIVE 08/08/2019 1440   BILIRUBINUR NEGATIVE 08/08/2019 1440   KETONESUR NEGATIVE 08/08/2019 1440   PROTEINUR >=300 (A) 08/08/2019 1440   NITRITE NEGATIVE 08/08/2019 1440   LEUKOCYTESUR NEGATIVE 08/08/2019 1440   Sepsis Labs: @LABRCNTIP (procalcitonin:4,lacticidven:4) ) Recent Results (from the past 240 hour(s))  SARS Coronavirus 2 [facility] order, Performed in [facility] hospital lab) Nasopharyngeal Nasopharyngeal Swab     Status: None   Collection Time: 08/15/19  7:56 AM   Specimen: Nasopharyngeal Swab  Result Value Ref Range Status   SARS Coronavirus 2 NEGATIVE NEGATIVE Final    Comment: (NOTE) If result is NEGATIVE SARS-CoV-2 target nucleic acids are NOT DETECTED. The SARS-CoV-2 RNA is generally detectable in upper and lower  respiratory specimens during the acute phase of infection. The lowest  concentration of SARS-CoV-2 viral copies this assay can detect is 250   copies / mL. A negative result does not preclude SARS-CoV-2 infection  and should not be used as the sole basis for treatment or other  patient management decisions.  A negative result may occur with  improper specimen collection / handling, submission of specimen other  than nasopharyngeal swab, presence of viral mutation(s) within the  areas targeted by this assay, and inadequate number of viral copies  (<  250 copies / mL). A negative result must be combined with clinical  observations, patient history, and epidemiological information. If result is POSITIVE SARS-CoV-2 target nucleic acids are DETECTED. The SARS-CoV-2 RNA is generally detectable in upper and lower  respiratory specimens dur ing the acute phase of infection.  Positive  results are indicative of active infection with SARS-CoV-2.  Clinical  correlation with patient history and other diagnostic information is  necessary to determine patient infection status.  Positive results do  not rule out bacterial infection or co-infection with other viruses. If result is PRESUMPTIVE POSTIVE SARS-CoV-2 nucleic acids MAY BE PRESENT.   A presumptive positive result was obtained on the submitted specimen  and confirmed on repeat testing.  While 2019 novel coronavirus  (SARS-CoV-2) nucleic acids may be present in the submitted sample  additional confirmatory testing may be necessary for epidemiological  and / or clinical management purposes  to differentiate between  SARS-CoV-2 and other Sarbecovirus currently known to infect humans.  If clinically indicated additional testing with an alternate test  methodology [phone]) is advised. The SARS-CoV-2 RNA is generally  detectable in upper and lower respiratory sp ecimens during the acute  phase of infection. The expected result is Negative. Fact Sheet for Patients:  StrictlyIdeas.no Fact Sheet for Healthcare Providers: BankingDealers.co.za  This test is not yet approved or cleared by the [place] FDA and has been authorized for detection and/or diagnosis of SARS-CoV-2 by FDA under an Emergency Use Authorization (EUA).  This EUA will remain in effect (meaning this test can be used) for the duration of the COVID-19 declaration under Section 564(b)(1) of the Act, 21 U.S.C. section 360bbb-3(b)(1), unless the authorization is terminated or revoked sooner. Performed at [facility], [place] [address] [place], [place] [address]      Radiological Exams on Admission: Dg Chest Port 1 View  Result Date: 08/19/2019 CLINICAL DATA:  Fever EXAM: PORTABLE CHEST 1 VIEW COMPARISON:  08/08/2019 FINDINGS: Right dialysis catheter remains in place, unchanged. Cardiomegaly, vascular congestion and bibasilar airspace opacities. Small bilateral effusions. No acute bony abnormality. IMPRESSION: Cardiomegaly with vascular congestion. Bilateral effusions with lower lobe airspace opacities. Findings could reflect atelectasis or infiltrates. Airspace opacity has improved slightly on the left, increasing slightly on the right. Electronically Signed   By: Rolm Baptise M.D.   On: 08/19/2019 00:18     EKG: Not done in ED, will get one.   Assessment/Plan Principal Problem:   Infection of AV graft for dialysis [facility]) Active Problems:   HTN (hypertension)   Stroke (HCC)   HLD (hyperlipidemia)   Tobacco abuse   ESRD on dialysis ([place])   Sepsis ([place])   Anemia in ESRD (end-stage renal disease) ([place])   Hypokalemia   Sepsis due to possible infection of AV graft for dialysis [facility]): pt meets criteria for sepsis with leukocytosis, fever, tachycardia.  Lactic acid is normal.  Currently hemodynamically stable. Dr. Edwyna Perfect was consulted by EDP. Per Dr. Marion Downer, "It is unclear at this time if this is a reaction to the gortex or an infected graft causing the redness on his forearm.  He does have a low grade fever.  The edema may be a reaction to the above, or  he may have a venous outflow stenosis.  I proposed removing the graft, however the patient would like to slalage it if possible.  I believe it is reasonable to admit him for IV abx and see if this improves with time".  - will admit to tele  bed as inpt - Highly appreciate Dr. Stephens Shire consultation - Empiric antimicrobial treatment with vancomycin and zosyn ( EDP discussed with pharmacist, who recommended Zosyn) - PRN Zofran for nausea, morphine and Percocet for pain - Blood cultures x 2  - will get Procalcitonin and trend lactic acid levels per sepsis protocol. - IVF: will not give IVF due to normal lactic acid and ESRD-HD  Essential hypertension: -IV Hydralazine prn -Continue home medications: Amlodipine, Coreg, hydralazine, minoxidil, spironolactone  Hx of Stroke [facility]): -Continue aspirin and Lipitor  HLD (hyperlipidemia): -lipitor  Tobacco abuse: -nicotine patch  ESRD on dialysis [facility]): -Please call renal for HD in AM.  Anemia in ESRD (end-stage renal disease) ([place]): Baseline hemoglobin 7.0-7.5 recently.  His hemoglobin 6.4.  No active bleeding.  No rectal bleeding. -1 unit of blood was ordered by ED physician -Check anemia panel  Hypokalemia: K3.4. mild -will not give KCl due to ESRD  -repeat BMP in aM   Inpatient status:  # Patient requires inpatient status due to high intensity of service, high risk for further deterioration and high frequency of surveillance required.  I certify that at the point of admission it is my clinical judgment that the patient will require inpatient hospital care spanning beyond 2 midnights from the point of admission.  . This patient has multiple chronic comorbidities including hypertension, hyperlipidemia, stroke, ESRD-HD (MWF), anemia, polysubstance abuse, cocaine abuse, tobacco abuse . Now patient has presenting with sepsis due to possible AV graft infection, and worening anemia . The worrisome physical exam findings: Pt has erythema,  swelling, tenderness and warmth over AV graft site, swelling extends from the wrist up to the right forearm.   . The initial radiographic and laboratory data are worrisome because of leukocytosis, low hemoglobin 6.4, hypokalemia . Current medical needs: please see my assessment and plan . Predictability of an adverse outcome (risk): Patient has multiple comorbidities, now presents with sepsis due to possible AV graft infection.  Patient may need to have graft removed.  His presentation is complicated, at high risk of deteriorating.  Will need to be treated in hospital for at least 2 days.     DVT ppx: SCD Code Status: Full code Family Communication:  Yes, patient's  wife  at bed side Disposition Plan:  Anticipate discharge back to previous home environment Consults called:  Dr. Trula Slade of [facility] Admission status:  Inpatient/tele    Date of Service 08/19/2019    [place] [facility]   If 7PM-7AM, please contact night-coverage [email or web address] Password [facility] 08/19/2019, 5:37 AM

## 2019-08-19 ENCOUNTER — Other Ambulatory Visit: Payer: Self-pay

## 2019-08-19 ENCOUNTER — Encounter (HOSPITAL_COMMUNITY): Payer: Self-pay

## 2019-08-19 DIAGNOSIS — T859XXA Unspecified complication of internal prosthetic device, implant and graft, initial encounter: Secondary | ICD-10-CM | POA: Diagnosis not present

## 2019-08-19 DIAGNOSIS — D649 Anemia, unspecified: Secondary | ICD-10-CM

## 2019-08-19 DIAGNOSIS — Z79899 Other long term (current) drug therapy: Secondary | ICD-10-CM | POA: Diagnosis not present

## 2019-08-19 DIAGNOSIS — I120 Hypertensive chronic kidney disease with stage 5 chronic kidney disease or end stage renal disease: Secondary | ICD-10-CM | POA: Diagnosis present

## 2019-08-19 DIAGNOSIS — Z20828 Contact with and (suspected) exposure to other viral communicable diseases: Secondary | ICD-10-CM | POA: Diagnosis present

## 2019-08-19 DIAGNOSIS — Z87891 Personal history of nicotine dependence: Secondary | ICD-10-CM | POA: Diagnosis not present

## 2019-08-19 DIAGNOSIS — Z992 Dependence on renal dialysis: Secondary | ICD-10-CM | POA: Diagnosis not present

## 2019-08-19 DIAGNOSIS — R509 Fever, unspecified: Secondary | ICD-10-CM

## 2019-08-19 DIAGNOSIS — E785 Hyperlipidemia, unspecified: Secondary | ICD-10-CM | POA: Diagnosis present

## 2019-08-19 DIAGNOSIS — Z833 Family history of diabetes mellitus: Secondary | ICD-10-CM | POA: Diagnosis not present

## 2019-08-19 DIAGNOSIS — Z8249 Family history of ischemic heart disease and other diseases of the circulatory system: Secondary | ICD-10-CM | POA: Diagnosis not present

## 2019-08-19 DIAGNOSIS — A419 Sepsis, unspecified organism: Secondary | ICD-10-CM | POA: Diagnosis present

## 2019-08-19 DIAGNOSIS — R110 Nausea: Secondary | ICD-10-CM | POA: Diagnosis present

## 2019-08-19 DIAGNOSIS — Z7902 Long term (current) use of antithrombotics/antiplatelets: Secondary | ICD-10-CM | POA: Diagnosis not present

## 2019-08-19 DIAGNOSIS — D631 Anemia in chronic kidney disease: Secondary | ICD-10-CM | POA: Diagnosis present

## 2019-08-19 DIAGNOSIS — M7989 Other specified soft tissue disorders: Secondary | ICD-10-CM

## 2019-08-19 DIAGNOSIS — Z888 Allergy status to other drugs, medicaments and biological substances status: Secondary | ICD-10-CM | POA: Diagnosis not present

## 2019-08-19 DIAGNOSIS — I119 Hypertensive heart disease without heart failure: Secondary | ICD-10-CM | POA: Diagnosis present

## 2019-08-19 DIAGNOSIS — Z8042 Family history of malignant neoplasm of prostate: Secondary | ICD-10-CM | POA: Diagnosis not present

## 2019-08-19 DIAGNOSIS — T827XXA Infection and inflammatory reaction due to other cardiac and vascular devices, implants and grafts, initial encounter: Secondary | ICD-10-CM | POA: Diagnosis present

## 2019-08-19 DIAGNOSIS — E876 Hypokalemia: Secondary | ICD-10-CM | POA: Diagnosis present

## 2019-08-19 DIAGNOSIS — Z8673 Personal history of transient ischemic attack (TIA), and cerebral infarction without residual deficits: Secondary | ICD-10-CM | POA: Diagnosis not present

## 2019-08-19 DIAGNOSIS — Y832 Surgical operation with anastomosis, bypass or graft as the cause of abnormal reaction of the patient, or of later complication, without mention of misadventure at the time of the procedure: Secondary | ICD-10-CM | POA: Diagnosis present

## 2019-08-19 DIAGNOSIS — N186 End stage renal disease: Secondary | ICD-10-CM | POA: Diagnosis present

## 2019-08-19 DIAGNOSIS — F1410 Cocaine abuse, uncomplicated: Secondary | ICD-10-CM | POA: Diagnosis present

## 2019-08-19 DIAGNOSIS — F1910 Other psychoactive substance abuse, uncomplicated: Secondary | ICD-10-CM | POA: Diagnosis present

## 2019-08-19 LAB — BASIC METABOLIC PANEL
Anion gap: 16 — ABNORMAL HIGH (ref 5–15)
BUN: 19 mg/dL (ref 6–20)
CO2: 26 mmol/L (ref 22–32)
Calcium: 9.2 mg/dL (ref 8.9–10.3)
Chloride: 94 mmol/L — ABNORMAL LOW (ref 98–111)
Creatinine, Ser: 7.63 mg/dL — ABNORMAL HIGH (ref 0.61–1.24)
GFR calc Af Amer: 10 mL/min — ABNORMAL LOW (ref >60)
GFR calc non Af Amer: 8 mL/min — ABNORMAL LOW (ref >60)
Glucose, Bld: 120 mg/dL — ABNORMAL HIGH (ref 70–99)
Potassium: 3.7 mmol/L (ref 3.5–5.1)
Sodium: 136 mmol/L (ref 135–145)

## 2019-08-19 LAB — RETICULOCYTES
Immature Retic Fract: 31.3 % — ABNORMAL HIGH (ref 2.3–15.9)
RBC.: 2.05 MIL/uL — ABNORMAL LOW (ref 4.22–5.81)
Retic Count, Absolute: 132.6 10*3/uL (ref 19.0–186.0)
Retic Ct Pct: 6.5 % — ABNORMAL HIGH (ref 0.4–3.1)

## 2019-08-19 LAB — FOLATE: Folate: 10.1 ng/mL (ref >5.9)

## 2019-08-19 LAB — IRON AND TIBC
Iron: 18 ug/dL — ABNORMAL LOW (ref 45–182)
Saturation Ratios: 8 % — ABNORMAL LOW (ref 17.9–39.5)
TIBC: 239 ug/dL — ABNORMAL LOW (ref 250–450)
UIBC: 221 ug/dL

## 2019-08-19 LAB — CBC
HCT: 20.8 % — ABNORMAL LOW (ref 39.0–52.0)
HCT: 20.1 % — ABNORMAL LOW (ref 39.0–52.0)
HCT: 21.8 % — ABNORMAL LOW (ref 39.0–52.0)
Hemoglobin: 6.4 g/dL — CL (ref 13.0–17.0)
Hemoglobin: 6.2 g/dL — CL (ref 13.0–17.0)
Hemoglobin: 6.7 g/dL — CL (ref 13.0–17.0)
MCH: 30.9 pg (ref 26.0–34.0)
MCH: 30.2 pg (ref 26.0–34.0)
MCH: 30.6 pg (ref 26.0–34.0)
MCHC: 30.8 g/dL (ref 30.0–36.0)
MCHC: 30.8 g/dL (ref 30.0–36.0)
MCHC: 30.7 g/dL (ref 30.0–36.0)
MCV: 100.5 fL — ABNORMAL HIGH (ref 80.0–100.0)
MCV: 98 fL (ref 80.0–100.0)
MCV: 99.5 fL (ref 80.0–100.0)
Platelets: 270 10*3/uL (ref 150–400)
Platelets: 256 10*3/uL (ref 150–400)
Platelets: 283 10*3/uL (ref 150–400)
RBC: 2.07 MIL/uL — ABNORMAL LOW (ref 4.22–5.81)
RBC: 2.05 MIL/uL — ABNORMAL LOW (ref 4.22–5.81)
RBC: 2.19 MIL/uL — ABNORMAL LOW (ref 4.22–5.81)
RDW: 13.3 % (ref 11.5–15.5)
RDW: 13.3 % (ref 11.5–15.5)
RDW: 13.3 % (ref 11.5–15.5)
WBC: 11.3 10*3/uL — ABNORMAL HIGH (ref 4.0–10.5)
WBC: 10.8 10*3/uL — ABNORMAL HIGH (ref 4.0–10.5)
WBC: 11 10*3/uL — ABNORMAL HIGH (ref 4.0–10.5)
nRBC: 0.2 % (ref 0.0–0.2)
nRBC: 0 % (ref 0.0–0.2)
nRBC: 0 % (ref 0.0–0.2)

## 2019-08-19 LAB — PROTIME-INR
INR: 1.3 — ABNORMAL HIGH (ref 0.8–1.2)
Prothrombin Time: 15.5 seconds — ABNORMAL HIGH (ref 11.4–15.2)

## 2019-08-19 LAB — VITAMIN B12: Vitamin B-12: 338 pg/mL (ref 180–914)

## 2019-08-19 LAB — PREPARE RBC (CROSSMATCH)

## 2019-08-19 LAB — FERRITIN: Ferritin: 266 ng/mL (ref 24–336)

## 2019-08-19 LAB — LACTIC ACID, PLASMA: Lactic Acid, Venous: 0.8 mmol/L (ref 0.5–1.9)

## 2019-08-19 LAB — PROCALCITONIN: Procalcitonin: 0.67 ng/mL

## 2019-08-19 LAB — APTT: aPTT: 35 seconds (ref 24–36)

## 2019-08-19 LAB — SARS CORONAVIRUS 2 BY RT PCR (HOSPITAL ORDER, PERFORMED IN ~~LOC~~ HOSPITAL LAB): SARS Coronavirus 2: NEGATIVE

## 2019-08-19 MED ORDER — PIPERACILLIN-TAZOBACTAM 3.375 G IVPB
3.3750 g | Freq: Two times a day (BID) | INTRAVENOUS | Status: DC
  Administered 2019-08-19 – 2019-08-20 (×3): 3.375 g via INTRAVENOUS
  Filled 2019-08-19 (×3): qty 50

## 2019-08-19 MED ORDER — MORPHINE SULFATE (PF) 2 MG/ML IV SOLN
2.0000 mg | INTRAVENOUS | Status: DC | PRN
  Administered 2019-08-19 (×2): 2 mg via INTRAVENOUS
  Filled 2019-08-19 (×2): qty 1

## 2019-08-19 MED ORDER — CHLORPROMAZINE HCL 10 MG PO TABS
10.0000 mg | ORAL_TABLET | Freq: Three times a day (TID) | ORAL | Status: DC | PRN
  Filled 2019-08-19: qty 1

## 2019-08-19 MED ORDER — AMLODIPINE BESYLATE 10 MG PO TABS
10.0000 mg | ORAL_TABLET | Freq: Every day | ORAL | Status: DC
  Administered 2019-08-19: 10 mg via ORAL
  Filled 2019-08-19: qty 1
  Filled 2019-08-19: qty 2

## 2019-08-19 MED ORDER — ACETAMINOPHEN 650 MG RE SUPP: 650.0000 mg | Freq: Four times a day (QID) | RECTAL | Status: DC | PRN

## 2019-08-19 MED ORDER — VANCOMYCIN HCL 10 G IV SOLR
1750.0000 mg | Freq: Once | INTRAVENOUS | Status: AC
  Administered 2019-08-19: 1750 mg via INTRAVENOUS
  Filled 2019-08-19: qty 1750

## 2019-08-19 MED ORDER — NICOTINE 7 MG/24HR TD PT24
7.0000 mg | MEDICATED_PATCH | Freq: Every day | TRANSDERMAL | Status: DC
  Administered 2019-08-19 – 2019-08-20 (×2): 7 mg via TRANSDERMAL
  Filled 2019-08-19 (×2): qty 1

## 2019-08-19 MED ORDER — ONDANSETRON HCL 4 MG/2ML IJ SOLN
4.0000 mg | Freq: Four times a day (QID) | INTRAMUSCULAR | Status: DC | PRN
  Administered 2019-08-19: 4 mg via INTRAVENOUS
  Filled 2019-08-19: qty 2

## 2019-08-19 MED ORDER — ASPIRIN 81 MG PO CHEW
81.0000 mg | CHEWABLE_TABLET | Freq: Every day | ORAL | Status: DC
  Administered 2019-08-19 – 2019-08-20 (×2): 81 mg via ORAL
  Filled 2019-08-19 (×2): qty 1

## 2019-08-19 MED ORDER — ATORVASTATIN CALCIUM 40 MG PO TABS
40.0000 mg | ORAL_TABLET | Freq: Every day | ORAL | Status: DC
  Administered 2019-08-19: 40 mg via ORAL
  Filled 2019-08-19: qty 1

## 2019-08-19 MED ORDER — HYDRALAZINE HCL 20 MG/ML IJ SOLN
5.0000 mg | INTRAMUSCULAR | Status: DC | PRN
  Administered 2019-08-19: 5 mg via INTRAVENOUS
  Filled 2019-08-19: qty 1

## 2019-08-19 MED ORDER — OXYCODONE-ACETAMINOPHEN 5-325 MG PO TABS
1.0000 | ORAL_TABLET | ORAL | Status: DC | PRN
  Administered 2019-08-19 – 2019-08-20 (×5): 1 via ORAL
  Filled 2019-08-19 (×5): qty 1

## 2019-08-19 MED ORDER — CARVEDILOL 25 MG PO TABS
25.0000 mg | ORAL_TABLET | Freq: Two times a day (BID) | ORAL | Status: DC
  Administered 2019-08-19 – 2019-08-20 (×3): 25 mg via ORAL
  Filled 2019-08-19 (×4): qty 1

## 2019-08-19 MED ORDER — SPIRONOLACTONE 25 MG PO TABS
25.0000 mg | ORAL_TABLET | Freq: Every day | ORAL | Status: DC
  Administered 2019-08-19 – 2019-08-20 (×2): 25 mg via ORAL
  Filled 2019-08-19 (×2): qty 1

## 2019-08-19 MED ORDER — VANCOMYCIN HCL IN DEXTROSE 750-5 MG/150ML-% IV SOLN: 750.0000 mg | INTRAVENOUS | Status: DC

## 2019-08-19 MED ORDER — MINOXIDIL 2.5 MG PO TABS
2.5000 mg | ORAL_TABLET | Freq: Two times a day (BID) | ORAL | Status: DC
  Administered 2019-08-19 (×2): 2.5 mg via ORAL
  Filled 2019-08-19 (×4): qty 1

## 2019-08-19 MED ORDER — HYDRALAZINE HCL 50 MG PO TABS
100.0000 mg | ORAL_TABLET | Freq: Three times a day (TID) | ORAL | Status: DC
  Administered 2019-08-19 – 2019-08-20 (×4): 100 mg via ORAL
  Filled 2019-08-19 (×5): qty 2

## 2019-08-19 MED ORDER — ACETAMINOPHEN 325 MG PO TABS: 650.0000 mg | ORAL_TABLET | Freq: Four times a day (QID) | ORAL | Status: DC | PRN

## 2019-08-19 MED ORDER — ONDANSETRON HCL 4 MG PO TABS: 4.0000 mg | ORAL_TABLET | Freq: Four times a day (QID) | ORAL | Status: DC | PRN

## 2019-08-19 NOTE — ED Notes (Signed)
Pt re-swabbed by this RN because lab never got previous swab.

## 2019-08-19 NOTE — ED Notes (Signed)
ED TO INPATIENT HANDOFF REPORT  ED Nurse Name and Phone #: Lovell Sheehan [phone]  S Name/Age/Gender William [name] 33 y.o. male Room/Bed: 036C/036C  Code Status   Code Status: Full Code  Home/SNF/Other Home Patient oriented to: self, place, time and situation Is this baseline? Yes   Triage Complete: Triage complete  Chief Complaint infected fistula, low blood  Triage Note Patient reports worsening swelling at right forearm AV fistula with drainage onset this morning with low grade fever , hemodialysis this morning .    Allergies Allergies  Allergen Reactions  . Reglan [Metoclopramide] Nausea And Vomiting    Develops akathisia    Level of Care/Admitting Diagnosis ED Disposition    ED Disposition Condition [facility]: [place] [[id number]]  Level of Care: Telemetry Medical [104]  Covid Evaluation: Asymptomatic Screening Protocol (No Symptoms)  Diagnosis: Infection of AV graft for dialysis [facility]) [id number]  Admitting Physician: Ivor Costa [[id number]]  Attending Physician: Ivor Costa [phone]  Estimated length of stay: past midnight tomorrow  Certification:: I certify this patient will need inpatient services for at least 2 midnights  PT Class (Do Not Modify): Inpatient [101]  PT Acc Code (Do Not Modify): Private [1]       B Medical/Surgery History Past Medical History:  Diagnosis Date  . Anemia   . Drug abuse, cocaine type ([place]) 06/23/2019  . ESRD (end stage renal disease) ([place]) 08/08/2019  . Hyperlipidemia   . Hypertension   . Stroke [facility]) 06/2019   Past Surgical History:  Procedure Laterality Date  . AV FISTULA PLACEMENT Right 08/15/2019   Procedure: INSERTION OF ARTERIOVENOUS (AV) GORE-TEX GRAFT ARM. Using 4-32mm STRETCH Goretex.;  Surgeon: Angelia Mould, MD;  Location: [facility] OR;  Service: Vascular;  Laterality: Right;  . INSERTION OF DIALYSIS CATHETER Right 08/08/2019   Procedure: INSERTION OF right internal jugular DIALYSIS  CATHETER;  Surgeon: Angelia Mould, MD;  Location: [facility];  Service: Vascular;  Laterality: Right;  . RADIOLOGY WITH ANESTHESIA N/A 06/23/2019   Procedure: MRI OF VESSELS;  Surgeon: Radiologist, Medication, MD;  Location: [place];  Service: Radiology;  Laterality: N/A;     A IV Location/Drains/Wounds Patient Lines/Drains/Airways Status   Active Line/Drains/Airways    Name:   Placement date:   Placement time:   Site:   Days:   Peripheral IV 08/18/19 Left Antecubital   08/18/19    2313    Antecubital   1   Peripheral IV 08/19/19 Left;Medial;Upper Arm   08/19/19    1241    Arm   less than 1   Hemodialysis Catheter Right Internal jugular Double lumen Permanent (Tunneled)   08/08/19    1722    Internal jugular   11   Incision (Closed) 08/08/19 Neck Right   08/08/19    1734     11   Incision (Closed) 08/15/19 Arm Right   08/15/19    1310     4          Intake/Output Last 24 hours  Intake/Output Summary (Last 24 hours) at 08/19/2019 1449 Last data filed at 08/19/2019 0451 Gross per 24 hour  Intake 500 ml  Output -  Net 500 ml    Labs/Imaging Results for orders placed or performed during the hospital encounter of 08/18/19 (from the past 48 hour(s))  Blood culture (routine x 2)     Status: None (Preliminary result)   Collection Time: 08/18/19  8:15 PM   Specimen: BLOOD RIGHT  ARM  Result Value Ref Range   Specimen Description BLOOD RIGHT ARM    Special Requests      BOTTLES DRAWN AEROBIC AND ANAEROBIC Blood Culture results may not be optimal due to an excessive volume of blood received in culture bottles   Culture      NO GROWTH < 24 HOURS Performed at [facility] [address], [place], [place] [address]    Report Status PENDING   CBC with Differential     Status: Abnormal   Collection Time: 08/18/19  8:21 PM  Result Value Ref Range   WBC 11.4 (H) 4.0 - 10.5 K/uL   RBC 2.13 (L) 4.22 - 5.81 MIL/uL   Hemoglobin 6.4 (LL) 13.0 - 17.0 g/dL    Comment: REPEATED TO  VERIFY THIS CRITICAL RESULT HAS VERIFIED AND BEEN CALLED TO RN [facility] BY MESSAN HOUEGNIFIO ON 09 04 2020 AT 2114, AND HAS BEEN READ BACK.     HCT 21.3 (L) 39.0 - 52.0 %   MCV 100.0 80.0 - 100.0 fL   MCH 30.0 26.0 - 34.0 pg   MCHC 30.0 30.0 - 36.0 g/dL   RDW 13.5 11.5 - 15.5 %   Platelets 296 150 - 400 K/uL   nRBC 0.0 0.0 - 0.2 %   Neutrophils Relative % 76 %   Neutro Abs 8.7 (H) 1.7 - 7.7 K/uL   Lymphocytes Relative 12 %   Lymphs Abs 1.3 0.7 - 4.0 K/uL   Monocytes Relative 10 %   Monocytes Absolute 1.2 (H) 0.1 - 1.0 K/uL   Eosinophils Relative 1 %   Eosinophils Absolute 0.1 0.0 - 0.5 K/uL   Basophils Relative 0 %   Basophils Absolute 0.0 0.0 - 0.1 K/uL   Immature Granulocytes 1 %   Abs Immature Granulocytes 0.06 0.00 - 0.07 K/uL    Comment: Performed at [facility] [address], [place], [place] [address]  Comprehensive metabolic panel     Status: Abnormal   Collection Time: 08/18/19  8:21 PM  Result Value Ref Range   Sodium 138 135 - 145 mmol/L   Potassium 3.4 (L) 3.5 - 5.1 mmol/L   Chloride 95 (L) 98 - 111 mmol/L   CO2 30 22 - 32 mmol/L   Glucose, Bld 127 (H) 70 - 99 mg/dL   BUN 11 6 - 20 mg/dL   Creatinine, Ser 5.69 (H) 0.61 - 1.24 mg/dL   Calcium 8.7 (L) 8.9 - 10.3 mg/dL   Total Protein 6.9 6.5 - 8.1 g/dL   Albumin 3.5 3.5 - 5.0 g/dL   AST 17 15 - 41 U/L   ALT 5 0 - 44 U/L   Alkaline Phosphatase 49 38 - 126 U/L   Total Bilirubin 0.6 0.3 - 1.2 mg/dL   GFR calc non Af Amer 12 (L) >60 mL/min   GFR calc Af Amer 14 (L) >60 mL/min   Anion gap 13 5 - 15    Comment: Performed at [facility] [address], [place], [place] [address]  Lactic acid, plasma     Status: None   Collection Time: 08/18/19  8:22 PM  Result Value Ref Range   Lactic Acid, Venous 1.1 0.5 - 1.9 mmol/L    Comment: Performed at [facility] [address] [place], [place] [address]  Blood culture (routine x 2)     Status: None (Preliminary result)   Collection Time:  08/18/19  8:22 PM   Specimen: BLOOD  RIGHT HAND  Result Value Ref Range   Specimen Description BLOOD RIGHT HAND    Special Requests      BOTTLES DRAWN AEROBIC ONLY Blood Culture results may not be optimal due to an excessive volume of blood received in culture bottles   Culture      NO GROWTH < 24 HOURS Performed at [facility] [address] [place], [place] [address]    Report Status PENDING   Type and screen [place]     Status: None (Preliminary result)   Collection Time: 08/18/19 11:25 PM  Result Value Ref Range   ABO/RH(D) O POS    Antibody Screen NEG    Sample Expiration 08/21/2019,2359    Unit Number [id number]    Blood Component Type RED CELLS,LR    Unit division 00    Status of Unit ALLOCATED    Transfusion Status OK TO TRANSFUSE    Crossmatch Result Compatible    Unit Number [id number]    Blood Component Type RED CELLS,LR    Unit division 00    Status of Unit ALLOCATED    Transfusion Status OK TO TRANSFUSE    Crossmatch Result      Compatible Performed at [facility], [place] [address], [place], [place] [address]   Prepare RBC     Status: None   Collection Time: 08/18/19 11:57 PM  Result Value Ref Range   Order Confirmation      ORDER PROCESSED BY BLOOD BANK Performed at [facility], [place] [address] [place], [place] [address]   APTT     Status: None   Collection Time: 08/18/19 11:58 PM  Result Value Ref Range   aPTT 35 24 - 36 seconds    Comment: Performed at [facility] [address] [place], [place] [address]  Protime-INR     Status: Abnormal   Collection Time: 08/18/19 11:58 PM  Result Value Ref Range   Prothrombin Time 15.5 (H) 11.4 - 15.2 seconds   INR 1.3 (H) 0.8 - 1.2    Comment: (NOTE) INR goal varies based on device and disease states. Performed at [facility], [place] [address] [place], [place] [address]   Vitamin B12     Status: None   Collection Time: 08/18/19 11:58 PM  Result  Value Ref Range   Vitamin B-12 338 180 - 914 pg/mL    Comment: (NOTE) This assay is not validated for testing neonatal or myeloproliferative syndrome specimens for Vitamin B12 levels. Performed at [facility], [place] [address] [place], [place] [address]   Folate     Status: None   Collection Time: 08/18/19 11:58 PM  Result Value Ref Range   Folate 10.1 >5.9 ng/mL    Comment: Performed at [facility], [place] [address] [place], [place] [address]  Iron and TIBC     Status: Abnormal   Collection Time: 08/18/19 11:58 PM  Result Value Ref Range   Iron 18 (L) 45 - 182 ug/dL   TIBC 239 (L) 250 - 450 ug/dL   Saturation Ratios 8 (L) 17.9 - 39.5 %   UIBC 221 ug/dL    Comment: Performed at [facility] [address], [place], [place] [address]  Ferritin     Status: None   Collection Time: 08/18/19 11:58 PM  Result Value Ref Range   Ferritin 266 24 - 336 ng/mL    Comment: Performed at  [facility], [place] [address] [place], [place] [address]  Reticulocytes     Status: Abnormal   Collection Time: 08/18/19 11:58 PM  Result Value Ref Range   Retic Ct Pct 6.5 (H) 0.4 - 3.1 %   RBC. 2.05 (L) 4.22 - 5.81 MIL/uL   Retic Count, Absolute 132.6 19.0 - 186.0 K/uL   Immature Retic Fract 31.3 (H) 2.3 - 15.9 %    Comment: Performed at [facility] [address] [place], [place] [address]  Lactic acid, plasma     Status: None   Collection Time: 08/19/19  9:07 AM  Result Value Ref Range   Lactic Acid, Venous 0.8 0.5 - 1.9 mmol/L    Comment: Performed at [facility] [address] [place], [place] [id number]  Basic metabolic panel     Status: Abnormal   Collection Time: 08/19/19  9:07 AM  Result Value Ref Range   Sodium 136 135 - 145 mmol/L   Potassium 3.7 3.5 - 5.1 mmol/L   Chloride 94 (L) 98 - 111 mmol/L   CO2 26 22 - 32 mmol/L   Glucose, Bld 120 (H) 70 - 99 mg/dL   BUN 19 6 - 20 mg/dL   Creatinine, Ser 7.63 (H) 0.61 - 1.24 mg/dL   Calcium 9.2 8.9 - 10.3  mg/dL   GFR calc non Af Amer 8 (L) >60 mL/min   GFR calc Af Amer 10 (L) >60 mL/min   Anion gap 16 (H) 5 - 15    Comment: Performed at [facility], [place] [address], [place], [place] [address]  CBC     Status: Abnormal   Collection Time: 08/19/19  9:07 AM  Result Value Ref Range   WBC 11.3 (H) 4.0 - 10.5 K/uL   RBC 2.07 (L) 4.22 - 5.81 MIL/uL   Hemoglobin 6.4 (LL) 13.0 - 17.0 g/dL    Comment: REPEATED TO VERIFY CRITICAL VALUE NOTED.  VALUE IS CONSISTENT WITH PREVIOUSLY REPORTED AND CALLED VALUE.    HCT 20.8 (L) 39.0 - 52.0 %   MCV 100.5 (H) 80.0 - 100.0 fL   MCH 30.9 26.0 - 34.0 pg   MCHC 30.8 30.0 - 36.0 g/dL   RDW 13.3 11.5 - 15.5 %   Platelets 270 150 - 400 K/uL   nRBC 0.2 0.0 - 0.2 %    Comment: Performed at [facility] [address] [place], [place] [address]  Procalcitonin     Status: None   Collection Time: 08/19/19  9:07 AM  Result Value Ref Range   Procalcitonin 0.67 ng/mL    Comment:        Interpretation: PCT > 0.5 ng/mL and <= 2 ng/mL: Systemic infection (sepsis) is possible, but other conditions are known to elevate PCT as well. (NOTE)       Sepsis PCT Algorithm           Lower Respiratory Tract                                      Infection PCT Algorithm    ----------------------------     ----------------------------         PCT < 0.25 ng/mL                PCT < 0.10 ng/mL         Strongly encourage  Strongly discourage   discontinuation of antibiotics    initiation of antibiotics    ----------------------------     -----------------------------       PCT 0.25 - 0.50 ng/mL            PCT 0.10 - 0.25 ng/mL               OR       >80% decrease in PCT            Discourage initiation of                                            antibiotics      Encourage discontinuation           of antibiotics    ----------------------------     -----------------------------         PCT >= 0.50 ng/mL              PCT 0.26 - 0.50 ng/mL                 AND       <80% decrease in PCT             Encourage initiation of                                             antibiotics       Encourage continuation           of antibiotics    ----------------------------     -----------------------------        PCT >= 0.50 ng/mL                  PCT > 0.50 ng/mL               AND         increase in PCT                  Strongly encourage                                      initiation of antibiotics    Strongly encourage escalation           of antibiotics                                     -----------------------------                                           PCT <= 0.25 ng/mL                                                 OR                                        >  80% decrease in PCT                                     Discontinue / Do not initiate                                             antibiotics Performed at [facility], [place] [address], [place], [place] [address]    Dg Chest Port 1 View  Result Date: 08/19/2019 CLINICAL DATA:  Fever EXAM: PORTABLE CHEST 1 VIEW COMPARISON:  08/08/2019 FINDINGS: Right dialysis catheter remains in place, unchanged. Cardiomegaly, vascular congestion and bibasilar airspace opacities. Small bilateral effusions. No acute bony abnormality. IMPRESSION: Cardiomegaly with vascular congestion. Bilateral effusions with lower lobe airspace opacities. Findings could reflect atelectasis or infiltrates. Airspace opacity has improved slightly on the left, increasing slightly on the right. Electronically Signed   By: Rolm Baptise M.D.   On: 08/19/2019 00:18    Pending Labs Unresulted Labs (From admission, onward)    Start     Ordered   08/18/19 2342  SARS Coronavirus 2 [facility] order, Performed in [facility] hospital lab) Nasopharyngeal Nasopharyngeal Swab  (Symptomatic/High Risk of Exposure/Tier 1 Patients Labs with Precautions)  Once,   STAT    Question Answer Comment  Is this test for diagnosis or  screening Screening   Symptomatic for COVID-19 as defined by CDC No   Hospitalized for COVID-19 No   Admitted to ICU for COVID-19 No   Previously tested for COVID-19 No   Resident in a congregate (group) care setting No   Employed in healthcare setting No      08/18/19 2342   08/18/19 2004  Lactic acid, plasma  Now then every 2 hours,   STAT     08/18/19 2004          Vitals/Pain Today's Vitals   08/19/19 1253 08/19/19 1403 08/19/19 1404 08/19/19 1408  BP: (!) 160/132 (!) 167/96 (!) 167/96   Pulse: 94  (!) 101   Resp: 14  20   Temp:      TempSrc:      SpO2: 98%  96%   PainSc: 7    0-No pain    Isolation Precautions Airborne and Contact precautions  Medications Medications  oxyCODONE-acetaminophen (PERCOCET/ROXICET) 5-325 MG per tablet 1 tablet (1 tablet Oral Given 08/19/19 1241)  morphine 2 MG/ML injection 2 mg (2 mg Intravenous Given 08/19/19 0835)  amLODipine (NORVASC) tablet 10 mg (10 mg Oral Given 08/19/19 1240)  atorvastatin (LIPITOR) tablet 40 mg (has no administration in time range)  carvedilol (COREG) tablet 25 mg (25 mg Oral Given 08/19/19 1103)  hydrALAZINE (APRESOLINE) tablet 100 mg (100 mg Oral Given 08/19/19 1403)  minoxidil (LONITEN) tablet 2.5 mg (2.5 mg Oral Given 08/19/19 1247)  spironolactone (ALDACTONE) tablet 25 mg (25 mg Oral Given 08/19/19 1249)  chlorproMAZINE (THORAZINE) tablet 10 mg (has no administration in time range)  nicotine (NICODERM CQ - dosed in mg/24 hr) patch 7 mg (7 mg Transdermal Patch Applied 08/19/19 1247)  aspirin chewable tablet 81 mg (81 mg Oral Given 08/19/19 1103)  acetaminophen (TYLENOL) tablet 650 mg (has no administration in time range)    Or  acetaminophen (TYLENOL) suppository 650 mg (has no administration in time range)  ondansetron (ZOFRAN) tablet 4 mg (  Oral See Alternative 08/19/19 0835)    Or  ondansetron [facility]) injection 4 mg (4 mg Intravenous Given 08/19/19 0835)  hydrALAZINE (APRESOLINE) injection 5 mg (5 mg Intravenous Given  08/19/19 0835)  piperacillin-tazobactam (ZOSYN) IVPB 3.375 g (3.375 g Intravenous New Bag/Given 08/19/19 1104)  vancomycin (VANCOCIN) IVPB 750 mg/150 ml premix (has no administration in time range)  0.9 %  sodium chloride infusion (Manually program via Guardrails IV Fluids) ( Intravenous Stopped 08/19/19 0202)  morphine 4 MG/ML injection 4 mg (4 mg Intravenous Given 08/19/19 0057)  piperacillin-tazobactam (ZOSYN) IVPB 3.375 g (0 g Intravenous Stopped 08/19/19 0201)  vancomycin (VANCOCIN) 1,750 mg in sodium chloride 0.9 % 500 mL IVPB (0 mg Intravenous Stopped 08/19/19 0451)    Mobility walks Low fall risk   Focused Assessments  R Recommendations: See Admitting Provider Note  Report given to:   Additional Notes:

## 2019-08-19 NOTE — ED Notes (Signed)
Pt up to the bedside  Bed straightened pt still has the burning pain in his rt arm    He reports that he feels like the compression glove is too tight

## 2019-08-19 NOTE — Consult Note (Signed)
[facility] Specialist of [place]  Patient name: William [name] MRN: [id number] DOB: 10/09/1986 Sex: male   REQUESTING PROVIDER:    ER   REASON FOR CONSULT:    Right arm swelling  HISTORY OF PRESENT ILLNESS:   William [name] is a 33 y.o. male, who is status post right forearm AVGG 3 days ago.  He woke up on Wednesday with significant swelling in his right arm.  His fingers are slightly numb and he has a low grade fever.  PAST MEDICAL HISTORY    Past Medical History:  Diagnosis Date  . Anemia   . Drug abuse, cocaine type ([place]) 06/23/2019  . ESRD (end stage renal disease) ([place]) 08/08/2019  . Hyperlipidemia   . Hypertension   . Stroke [facility]) 06/2019     FAMILY HISTORY   Family History  Problem Relation Age of Onset  . Diabetes Sister   . Diabetes Maternal Uncle   . Hypertension Father   . Hypertension Paternal Grandfather   . Prostate cancer Paternal Grandfather   . Diabetes Sister   . Renal Disease Neg Hx     SOCIAL HISTORY:   Social History   Socioeconomic History  . Marital status: Married    Spouse name: William [name]  . Number of children: 3  . Years of education: Not on file  . Highest education level: High school graduate  Occupational History  . Occupation: Secretary/administrator - laid off  Social Needs  . Financial resource strain: Not hard at all  . Food insecurity    Worry: Never true    Inability: Never true  . Transportation needs    Medical: No    Non-medical: No  Tobacco Use  . Smoking status: Former Smoker    Packs/day: 0.00    Years: 20.00    Pack years: 0.00    Quit date: 06/2019    Years since quitting: 0.1  . Smokeless tobacco: Never Used  Substance and Sexual Activity  . Alcohol use: Not Currently    Comment: 2-3 beers a day; stopped with 7/20 hospitalization  . Drug use: Yes    Types: Marijuana, Cocaine, Benzodiazepines    Comment: denies use since 7/20 hospitalization  . Sexual activity:  Not Currently    Partners: Female  Lifestyle  . Physical activity    Days per week: 0 days    Minutes per session: 0 min  . Stress: Not at all  Relationships  . Social connections    Talks on phone: More than three times a week    Gets together: Never    Attends religious service: 1 to 4 times per year    Active member of club or organization: No    Attends meetings of clubs or organizations: Never    Relationship status: Married  . Intimate partner violence    Fear of current or ex partner: No    Emotionally abused: No    Physically abused: No    Forced sexual activity: No  Other Topics Concern  . Not on file  Social History Narrative  . Not on file    ALLERGIES:    Allergies  Allergen Reactions  . Reglan [Metoclopramide] Nausea And Vomiting    Develops akathisia    CURRENT MEDICATIONS:    Current Facility-Administered Medications  Medication Dose Route Frequency Provider Last Rate Last Dose  . 0.9 %  sodium chloride infusion (Manually program via Guardrails IV Fluids)   Intravenous Once Muthersbaugh, Jarrett Soho, PA-C      .  piperacillin-tazobactam (ZOSYN) IVPB 3.375 g  3.375 g Intravenous Once Muthersbaugh, Jarrett Soho, PA-C       Current Outpatient Medications  Medication Sig Dispense Refill  . amLODipine (NORVASC) 10 MG tablet Take 1 tablet (10 mg total) by mouth at bedtime. 30 tablet 0  . aspirin EC 325 MG tablet Take 325 mg by mouth daily as needed for moderate pain.    Marland Kitchen atorvastatin (LIPITOR) 40 MG tablet Take 1 tablet (40 mg total) by mouth daily at 6 PM.    . carvedilol (COREG) 25 MG tablet Take 1 tablet (25 mg total) by mouth 2 (two) times daily with a meal.    . chlorproMAZINE (THORAZINE) 10 MG tablet Take 1 tablet (10 mg total) by mouth 3 (three) times daily as needed for hiccoughs. 20 tablet 0  . hydrALAZINE (APRESOLINE) 100 MG tablet Take 1 tablet (100 mg total) by mouth every 8 (eight) hours. 30 tablet 3  . HYDROcodone-acetaminophen (NORCO) 5-325 MG tablet  Take 1 tablet by mouth every 6 (six) hours as needed for moderate pain. 12 tablet 0  . minoxidil (LONITEN) 2.5 MG tablet Take 2.5 mg by mouth 2 (two) times daily.    . nicotine (NICODERM CQ - DOSED IN MG/24 HR) 7 mg/24hr patch Place 7 mg onto the skin daily.    Marland Kitchen spironolactone (ALDACTONE) 25 MG tablet Take 25 mg by mouth daily.    Marland Kitchen aspirin 81 MG chewable tablet Chew 1 tablet (81 mg total) by mouth daily. (Patient not taking: Reported on 08/18/2019)    . clopidogrel (PLAVIX) 75 MG tablet Take 1 tablet (75 mg total) by mouth daily. (Patient not taking: Reported on 08/18/2019) 4 tablet 0    REVIEW OF SYSTEMS:   [X]  denotes positive finding, [ ]  denotes negative finding Cardiac  Comments:  Chest pain or chest pressure:    Shortness of breath upon exertion:    Short of breath when lying flat:    Irregular heart rhythm:        Vascular    Pain in calf, thigh, or hip brought on by ambulation:    Pain in feet at night that wakes you up from your sleep:     Blood clot in your veins:    Leg swelling:         Pulmonary    Oxygen at home:    Productive cough:     Wheezing:         Neurologic    Sudden weakness in arms or legs:     Sudden numbness in arms or legs:     Sudden onset of difficulty speaking or slurred speech:    Temporary loss of vision in one eye:     Problems with dizziness:         Gastrointestinal    Blood in stool:      Vomited blood:         Genitourinary    Burning when urinating:     Blood in urine:        Psychiatric    Major depression:         Hematologic    Bleeding problems:    Problems with blood clotting too easily:        Skin    Rashes or ulcers:        Constitutional    Fever or chills:     PHYSICAL EXAM:   Vitals:   08/18/19 1922  BP: 120/66  Pulse: (!) 121  Resp:  18  Temp: 100.2 F (37.9 C)  TempSrc: Oral  SpO2: 96%    GENERAL: The patient is a well-nourished male, in no acute distress. The vital signs are documented above.  CARDIAC: There is a regular rate and rhythm.  VASCULAR: excellent thrillin AVGG.  Profound edema in the right arm up to the axilla PULMONARY: Nonlabored respirations MUSCULOSKELETAL: There are no major deformities or cyanosis. NEUROLOGIC: No focal weakness or paresthesias are detected. SKIN: There are no ulcers or rashes noted. PSYCHIATRIC: The patient has a normal affect.  STUDIES:   none  ASSESSMENT and PLAN   Dialysis access complication:  It is unclear at this time if this is a reaction to the gortex or an infected graft causing the redness on his forearm.  He does have a low grade fever.  The edema may be a reaction to the above, or he may have a venous outflow stenosis.  I proposed removing the graft, however the patient would like to slalage it if possible.  I believe it is reasonable to admit him for IV abx and see if this improves with time.  I wrapped the arm with an ACE, and told him to keep it elevated.  I will follow up in the morning for further assessment.   Leia Alf, MD, FACS [facility] [phone] Pager [phone]

## 2019-08-19 NOTE — ED Notes (Signed)
Pt c/o a burning pain in his rt arm  He reports that it feels too tight much swelling in his shoulder that is not wrapped  Radial pulse felt  Just the pain from feeling for his pulse gave the pt much pain

## 2019-08-19 NOTE — Progress Notes (Signed)
CRITICAL VALUE ALERT  Critical Value: Hgb.: 6.2  Date & Time Notied: 08/19/19 1710  Provider Notified: Wynelle Cleveland, MD   Orders Received/Actions taken: MD gave verbal order to re-check CBC to verify this result. Order placed for CBC re-draw.

## 2019-08-19 NOTE — Progress Notes (Signed)
PROGRESS NOTE    William [name]   [id number]  DOB: 1986/05/03  DOA: 08/18/2019 PCP: Patient, No Pcp Per   Brief Narrative:  William [name]   is a 33 y.o. male with medical history significant of hypertension, hyperlipidemia, stroke, ESRD-HD (MWF), anemia, polysubstance abuse, cocaine abuse, tobacco abuse, who presents with right arm pain, swelling, fever and chills. Pt had AV graft placed on 08/15/19 by Dr. Doren Custard. He states that he has been having pain and swelling in right arm in the past 3 days, which has been progressively worsening with fever chills and weakness. Patient states that he did dialysis on Friday morning. He reports that he was given an unknown antibiotic during dialysis.   Vascular surgery evaluated him in ED and recommended to remove the graft but patient wanted to salvage it. Dr Trula Slade then decided that he stay for IV antibiotics.  Subjective: He states his swelling has improved overnight but not resolved.     Assessment & Plan:   Principal Problem:   Infection of AV graft for dialysis  temp 100.2, HR in 100-120 range, WBC count 11.4 - Vanc and Zosyn started- improving- vascular surgery following- f/u cultures - controlling pain with IV Morphine and Oxycodone- he has needed multiple doses  Active Problems:   HTN (hypertension) - all home meds resumed> Norvasc, Coreg, Minoxidil, Aldactone, Hydralazine -  pain also playing a part in the HTN  H/o CVA in 7/20 - cont ASA- d/c Plavix (on 7/10 neurologist stated that he should stop plavix in 3 wks) - cont Lipitor     ESRD on dialysis  - MWF dialysis- received dialysis on Friday- will hold off on calling nephrology as, per vascular surgery note, he may be able to go home tomorrow    Anemia in ESRD (end-stage renal disease)  - given 1 U PRBC for Hb of 6.4 - repeat Hb - normal ferretin level - B12 338  Nicotine abuse - cont Nicotine patch  H/o Cocaine abuse  - no UDS done during this admission- will  order   Time spent in minutes: 35 DVT prophylaxis: SCDs Code Status: Full code Family Communication:  Disposition Plan: home when improved Consultants:   Vascular surgery Procedures:   none Antimicrobials:  Anti-infectives (From admission, onward)   Start     Dose/Rate Route Frequency Ordered Stop   08/21/19 1200  vancomycin (VANCOCIN) IVPB 750 mg/150 ml premix     750 mg 150 mL/hr over 60 Minutes Intravenous Every M-W-F (Hemodialysis) 08/19/19 1434     08/19/19 1000  piperacillin-tazobactam (ZOSYN) IVPB 3.375 g     3.375 g 12.5 mL/hr over 240 Minutes Intravenous Every 12 hours 08/19/19 0131     08/19/19 0145  vancomycin (VANCOCIN) 1,750 mg in sodium chloride 0.9 % 500 mL IVPB     1,750 mg 250 mL/hr over 120 Minutes Intravenous  Once 08/19/19 0131 08/19/19 0451   08/18/19 2315  piperacillin-tazobactam (ZOSYN) IVPB 3.375 g     3.375 g 100 mL/hr over 30 Minutes Intravenous  Once 08/18/19 2309 08/19/19 0201       Objective: Vitals:   08/19/19 1253 08/19/19 1403 08/19/19 1404 08/19/19 1531  BP: (!) 160/132 (!) 167/96 (!) 167/96 (!) 141/88  Pulse: 94  (!) 101 (!) 105  Resp: 14  20 20   Temp:    98.3 F (36.8 C)  TempSrc:    Oral  SpO2: 98%  96% 93%  Weight:    81.3 kg  Height:    5'  9" (1.753 m)    Intake/Output Summary (Last 24 hours) at 08/19/2019 1552 Last data filed at 08/19/2019 1531 Gross per 24 hour  Intake 500 ml  Output 0 ml  Net 500 ml   Filed Weights   08/19/19 1531  Weight: 81.3 kg    Examination: General exam: Appears comfortable  HEENT: PERRLA, oral mucosa moist, no sclera icterus or thrush Respiratory system: Clear to auscultation. Respiratory effort normal. Cardiovascular system: S1 & S2 heard, RRR.   Gastrointestinal system: Abdomen soft, non-tender, nondistended. Normal bowel sounds. Central nervous system: Alert and oriented. No focal neurological deficits. Extremities: No cyanosis, clubbing - right arm and hand swollen with hand being much  more swollen then arm Skin: No rashes or ulcers Psychiatry:  Mood & affect appropriate.     Data Reviewed: I have personally reviewed following labs and imaging studies  CBC: Recent Labs  Lab 08/15/19 0837 08/18/19 2021 08/19/19 0907  WBC  --  11.4* 11.3*  NEUTROABS  --  8.7*  --   HGB 7.5* 6.4* 6.4*  HCT 22.0* 21.3* 20.8*  MCV  --  100.0 100.5*  PLT  --  296 [id number]   Basic Metabolic Panel: Recent Labs  Lab 08/15/19 0837 08/18/19 2021 08/19/19 0907  NA 135 138 136  K 3.8 3.4* 3.7  CL  --  95* 94*  CO2  --  30 26  GLUCOSE 92 127* 120*  BUN  --  11 19  CREATININE  --  5.69* 7.63*  CALCIUM  --  8.7* 9.2   GFR: Estimated Creatinine Clearance: 13.6 mL/min (A) (by C-G formula based on SCr of 7.63 mg/dL (H)). Liver Function Tests: Recent Labs  Lab 08/18/19 2021  AST 17  ALT 5  ALKPHOS 49  BILITOT 0.6  PROT 6.9  ALBUMIN 3.5   No results for input(s): LIPASE, AMYLASE in the last 168 hours. No results for input(s): AMMONIA in the last 168 hours. Coagulation Profile: Recent Labs  Lab 08/18/19 2358  INR 1.3*   Cardiac Enzymes: No results for input(s): CKTOTAL, CKMB, CKMBINDEX, TROPONINI in the last 168 hours. BNP (last 3 results) No results for input(s): PROBNP in the last 8760 hours. HbA1C: No results for input(s): HGBA1C in the last 72 hours. CBG: No results for input(s): GLUCAP in the last 168 hours. Lipid Profile: No results for input(s): CHOL, HDL, LDLCALC, TRIG, CHOLHDL, LDLDIRECT in the last 72 hours. Thyroid Function Tests: No results for input(s): TSH, T4TOTAL, FREET4, T3FREE, THYROIDAB in the last 72 hours. Anemia Panel: Recent Labs    08/18/19 2358  VITAMINB12 338  FOLATE 10.1  FERRITIN 266  TIBC 239*  IRON 18*  RETICCTPCT 6.5*   Urine analysis:    Component Value Date/Time   COLORURINE YELLOW 08/08/2019 1440   APPEARANCEUR HAZY (A) 08/08/2019 1440   LABSPEC 1.012 08/08/2019 1440   PHURINE 5.0 08/08/2019 1440   GLUCOSEU NEGATIVE  08/08/2019 1440   HGBUR NEGATIVE 08/08/2019 1440   BILIRUBINUR NEGATIVE 08/08/2019 1440   KETONESUR NEGATIVE 08/08/2019 1440   PROTEINUR >=300 (A) 08/08/2019 1440   NITRITE NEGATIVE 08/08/2019 1440   LEUKOCYTESUR NEGATIVE 08/08/2019 1440   Sepsis Labs: @LABRCNTIP (procalcitonin:4,lacticidven:4) ) Recent Results (from the past 240 hour(s))  SARS Coronavirus 2 [facility] order, Performed in [facility] hospital lab) Nasopharyngeal Nasopharyngeal Swab     Status: None   Collection Time: 08/15/19  7:56 AM   Specimen: Nasopharyngeal Swab  Result Value Ref Range Status   SARS Coronavirus 2 NEGATIVE NEGATIVE Final  Comment: (NOTE) If result is NEGATIVE SARS-CoV-2 target nucleic acids are NOT DETECTED. The SARS-CoV-2 RNA is generally detectable in upper and lower  respiratory specimens during the acute phase of infection. The lowest  concentration of SARS-CoV-2 viral copies this assay can detect is 250  copies / mL. A negative result does not preclude SARS-CoV-2 infection  and should not be used as the sole basis for treatment or other  patient management decisions.  A negative result may occur with  improper specimen collection / handling, submission of specimen other  than nasopharyngeal swab, presence of viral mutation(s) within the  areas targeted by this assay, and inadequate number of viral copies  (<250 copies / mL). A negative result must be combined with clinical  observations, patient history, and epidemiological information. If result is POSITIVE SARS-CoV-2 target nucleic acids are DETECTED. The SARS-CoV-2 RNA is generally detectable in upper and lower  respiratory specimens dur ing the acute phase of infection.  Positive  results are indicative of active infection with SARS-CoV-2.  Clinical  correlation with patient history and other diagnostic information is  necessary to determine patient infection status.  Positive results do  not rule out bacterial infection or  co-infection with other viruses. If result is PRESUMPTIVE POSTIVE SARS-CoV-2 nucleic acids MAY BE PRESENT.   A presumptive positive result was obtained on the submitted specimen  and confirmed on repeat testing.  While 2019 novel coronavirus  (SARS-CoV-2) nucleic acids may be present in the submitted sample  additional confirmatory testing may be necessary for epidemiological  and / or clinical management purposes  to differentiate between  SARS-CoV-2 and other Sarbecovirus currently known to infect humans.  If clinically indicated additional testing with an alternate test  methodology [phone]) is advised. The SARS-CoV-2 RNA is generally  detectable in upper and lower respiratory sp ecimens during the acute  phase of infection. The expected result is Negative. Fact Sheet for Patients:  StrictlyIdeas.no Fact Sheet for Healthcare Providers: BankingDealers.co.za This test is not yet approved or cleared by the [place] FDA and has been authorized for detection and/or diagnosis of SARS-CoV-2 by FDA under an Emergency Use Authorization (EUA).  This EUA will remain in effect (meaning this test can be used) for the duration of the COVID-19 declaration under Section 564(b)(1) of the Act, 21 U.S.C. section 360bbb-3(b)(1), unless the authorization is terminated or revoked sooner. Performed at [facility], [place] [address], [place], [place] [address]   Blood culture (routine x 2)     Status: None (Preliminary result)   Collection Time: 08/18/19  8:15 PM   Specimen: BLOOD RIGHT ARM  Result Value Ref Range Status   Specimen Description BLOOD RIGHT ARM  Final   Special Requests   Final    BOTTLES DRAWN AEROBIC AND ANAEROBIC Blood Culture results may not be optimal due to an excessive volume of blood received in culture bottles   Culture   Final    NO GROWTH < 24 HOURS Performed at [facility], [place] [address], [place], [place]  [address]    Report Status PENDING  Incomplete  Blood culture (routine x 2)     Status: None (Preliminary result)   Collection Time: 08/18/19  8:22 PM   Specimen: BLOOD RIGHT HAND  Result Value Ref Range Status   Specimen Description BLOOD RIGHT HAND  Final   Special Requests   Final    BOTTLES DRAWN AEROBIC ONLY Blood Culture results may not be optimal due to  an excessive volume of blood received in culture bottles   Culture   Final    NO GROWTH < 24 HOURS Performed at [facility] [address], [place] [employer], [place] [address]    Report Status PENDING  Incomplete         Radiology Studies: Dg Chest Port 1 View  Result Date: 08/19/2019 CLINICAL DATA:  Fever EXAM: PORTABLE CHEST 1 VIEW COMPARISON:  08/08/2019 FINDINGS: Right dialysis catheter remains in place, unchanged. Cardiomegaly, vascular congestion and bibasilar airspace opacities. Small bilateral effusions. No acute bony abnormality. IMPRESSION: Cardiomegaly with vascular congestion. Bilateral effusions with lower lobe airspace opacities. Findings could reflect atelectasis or infiltrates. Airspace opacity has improved slightly on the left, increasing slightly on the right. Electronically Signed   By: Rolm Baptise M.D.   On: 08/19/2019 00:18      Scheduled Meds: . amLODipine  10 mg Oral QHS  . aspirin  81 mg Oral Daily  . atorvastatin  40 mg Oral q1800  . carvedilol  25 mg Oral BID WC  . hydrALAZINE  100 mg Oral Q8H  . minoxidil  2.5 mg Oral BID  . nicotine  7 mg Transdermal Daily  . spironolactone  25 mg Oral Daily   Continuous Infusions: . piperacillin-tazobactam (ZOSYN)  IV Stopped (08/19/19 1517)  . [START ON 08/21/2019] vancomycin       LOS: 0 days      Debbe Odea, MD [facility] Pager: [email or web address] Password [email or web address] 08/19/2019, 3:52 PM

## 2019-08-19 NOTE — Progress Notes (Signed)
NEW ADMISSION NOTE New Admission Note:   Arrival Method: Bed Mental Orientation: A&O X4 Telemetry: 5M12 Assessment: Completed Skin: WDL  IV: WDL Pain: Denies Safety Measures: Safety Fall Prevention Plan has been given, discussed and signed Admission: Completed [facility] Orientation: Patient has been orientated to the room, unit and staff.   Orders have been reviewed and implemented. Will continue to monitor the patient. Call light has been placed within reach and bed alarm has been activated.   Jaia Alonge [place] BSN, RN3 

## 2019-08-19 NOTE — ED Notes (Signed)
covid 19 swab collected  Bur the order was  discd before I could click off the paper

## 2019-08-19 NOTE — ED Notes (Signed)
A physician  Has placed 2 ace  Bandages to his rt arm and a compression glove  Attempting to keep his rt arm elevated.    He has a dialysis catheter in his rt upper chest

## 2019-08-19 NOTE — ED Notes (Signed)
William [name] (SR)-Lunch Tray Ordered @ 1020-per RN called by Levada Dy

## 2019-08-19 NOTE — Progress Notes (Addendum)
[facility] Renal Quick Note:  S: Mr. Kreinbrink presented yesterday evening with progressive edema of R arm s/p new forearm AVG on 9/1. Evaluated by vascular surgery with thought either infection or gortex reaction - started Vanc/Zosyn and arm elevated. Per f/u VVS note, edema much improved already and may be able to go home tomorrow.   Denies CP/dyspnea at this time - says R arm "five times improved."  Dialyzes on MWF schedule - did get full dialysis yesterday (9/4) prior to ED evaluation.  O: Blood pressure (!) 168/108, pulse (!) 122, temperature 98.2 F (36.8 C), resp. rate 20, SpO2 96 %.  Gen: Well appearing man, NAD. On room air. CV: RRR; no murmur PULM: CTAB EXTREM: No LE edema - diffusely edematous and tender RUE, AVG + bruit, TDC  LABS (9/5): K 3.7, Ca 9.2, WBC 11.3, Hgb 6.4  A/P: - ?AVG infection v. Gortex  reaction: On Vanc/Zosyn. Per VVS. - ESRD: Usual MWF schedule - next due 9/7 if still here. - Anemia: Hgb 6.4 - was 6.2 on 9/2. Has been given 4 of 10 doses Venofer + Aranesp 60 on 9/2. No need to transfuse unless acutely symptomatic in this young man with transplant potential. - HTN: Continue home meds and follow for now.   If status changes and gets fully admitted, will plan on full consult. Otherwise, will keep eye on him for potential dialysis needs.  Veneta Penton, PA-C [employer] Pager [phone]  Pt seen, examined and agree w A/P as above.  Kelly Splinter  MD 08/19/2019, 5:45 PM

## 2019-08-19 NOTE — Progress Notes (Addendum)
  Progress Note    08/19/2019 10:22 AM  Subjective:  Patient believes R arm is 5x better since elevating and using ACE wrap   Vitals:   08/19/19 0900 08/19/19 0930  BP: (!) 189/110 (!) 176/113  Pulse:    Resp: 16 20  Temp:    SpO2:     Physical Exam: Lungs:  Non labored Extremities:  R arm and hand edematous compared to L; palpable pulse R forearm graft Neurologic: A&O  CBC    Component Value Date/Time   WBC 11.3 (H) 08/19/2019 0907   RBC 2.07 (L) 08/19/2019 0907   HGB 6.4 (LL) 08/19/2019 0907   HCT 20.8 (L) 08/19/2019 0907   PLT 270 08/19/2019 0907   MCV 100.5 (H) 08/19/2019 0907   MCH 30.9 08/19/2019 0907   MCHC 30.8 08/19/2019 0907   RDW 13.3 08/19/2019 0907   LYMPHSABS 1.3 08/18/2019 2021   MONOABS 1.2 (H) 08/18/2019 2021   EOSABS 0.1 08/18/2019 2021   BASOSABS 0.0 08/18/2019 2021    BMET    Component Value Date/Time   NA 136 08/19/2019 0907   K 3.7 08/19/2019 0907   CL 94 (L) 08/19/2019 0907   CO2 26 08/19/2019 0907   GLUCOSE 120 (H) 08/19/2019 0907   BUN 19 08/19/2019 0907   CREATININE 7.63 (H) 08/19/2019 0907   CALCIUM 9.2 08/19/2019 0907   GFRNONAA 8 (L) 08/19/2019 0907   GFRAA 10 (L) 08/19/2019 0907    INR    Component Value Date/Time   INR 1.3 (H) 08/18/2019 2358     Intake/Output Summary (Last 24 hours) at 08/19/2019 1022 Last data filed at 08/19/2019 0451 Gross per 24 hour  Intake 500 ml  Output -  Net 500 ml     Assessment/Plan:  33 y.o. male with edema RUE s/p R forearm loop graft placement  R arm edema improved with elevation and compression Ok to have renal diet; no indication for graft removal at this time Will observe another 24 hours; if arm continues to improve, ok for discharge home   Dagoberto Ligas, PA-C [facility] [phone] 08/19/2019 10:22 AM  I agree with the above.  I have seen and evaluated to the patient.  Right arm much better this am.  Continue with arm elevation, external compression and  IV abx.  Hopefully he can avoid great removal   Wells   Wells

## 2019-08-19 NOTE — Progress Notes (Signed)
Pharmacy Antibiotic Note  William [name] is a 33 y.o. male admitted on 08/18/2019 with AV graft infection s/p placement a few days ago.  Pharmacy has been consulted for Vancocin and Zosyn dosing.  When AVG was placed pt had a temporary HD catheter for new start of HD.  Plan: Vancomycin 1750mg  IV x1 and f/u HD schedule for redosing. Zosyn 3.375g IV Q12H (4-hour infusion).   Temp (24hrs), Avg:100.2 F (37.9 C), Min:100.2 F (37.9 C), Max:100.2 F (37.9 C)  Recent Labs  Lab 08/18/19 2021 08/18/19 2022  WBC 11.4*  --   CREATININE 5.69*  --   LATICACIDVEN  --  1.1    Estimated Creatinine Clearance: 18.3 mL/min (A) (by C-G formula based on SCr of 5.69 mg/dL (H)).    Allergies  Allergen Reactions  . Reglan [Metoclopramide] Nausea And Vomiting    Develops akathisia     Thank you for allowing pharmacy to be a part of this patient's care.  Wynona Neat, PharmD, BCPS  08/19/2019 1:36 AM

## 2019-08-19 NOTE — Progress Notes (Signed)
CRITICAL VALUE ALERT  Critical Value: Hgb: 6.7   Date & Time Notied: 08/19/19 1850  Provider Notified: Rizwan  Orders Received/Actions taken: MD stated that she would re-check CBC in the morning and to hold off on giving blood at this time. Laurey Arrow RN made aware. Patient educated on doctors orders and understanding.

## 2019-08-19 NOTE — Progress Notes (Deleted)
This RN informed patient that since her daughter was 40, that the patients daughter would have to leave at 2000 when visiting hours are over, but that she could return at 1000 tomorrow morning. Patient stated, "I need my daughter! She is the only one that knows how to calm me down. She is my support! If she has to leave then you might as well let me go home now!" Balta, [facility] contacted and she stated she would be up to see the patient. Broadus John, MD also notified.

## 2019-08-20 DIAGNOSIS — I639 Cerebral infarction, unspecified: Secondary | ICD-10-CM

## 2019-08-20 LAB — BASIC METABOLIC PANEL
Anion gap: 16 — ABNORMAL HIGH (ref 5–15)
BUN: 27 mg/dL — ABNORMAL HIGH (ref 6–20)
CO2: 28 mmol/L (ref 22–32)
Calcium: 9.6 mg/dL (ref 8.9–10.3)
Chloride: 93 mmol/L — ABNORMAL LOW (ref 98–111)
Creatinine, Ser: 10.18 mg/dL — ABNORMAL HIGH (ref 0.61–1.24)
GFR calc Af Amer: 7 mL/min — ABNORMAL LOW (ref >60)
GFR calc non Af Amer: 6 mL/min — ABNORMAL LOW (ref >60)
Glucose, Bld: 110 mg/dL — ABNORMAL HIGH (ref 70–99)
Potassium: 3.5 mmol/L (ref 3.5–5.1)
Sodium: 137 mmol/L (ref 135–145)

## 2019-08-20 LAB — MRSA PCR SCREENING: MRSA by PCR: NEGATIVE

## 2019-08-20 LAB — GLUCOSE, CAPILLARY: Glucose-Capillary: 105 mg/dL — ABNORMAL HIGH (ref 70–99)

## 2019-08-20 LAB — CBC
HCT: 22.8 % — ABNORMAL LOW (ref 39.0–52.0)
Hemoglobin: 7.1 g/dL — ABNORMAL LOW (ref 13.0–17.0)
MCH: 30.9 pg (ref 26.0–34.0)
MCHC: 31.1 g/dL (ref 30.0–36.0)
MCV: 99.1 fL (ref 80.0–100.0)
Platelets: 368 10*3/uL (ref 150–400)
RBC: 2.3 MIL/uL — ABNORMAL LOW (ref 4.22–5.81)
RDW: 13.2 % (ref 11.5–15.5)
WBC: 11.7 10*3/uL — ABNORMAL HIGH (ref 4.0–10.5)
nRBC: 0 % (ref 0.0–0.2)

## 2019-08-20 MED ORDER — CHLORHEXIDINE GLUCONATE CLOTH 2 % EX PADS
6.0000 | MEDICATED_PAD | Freq: Every day | CUTANEOUS | Status: DC
  Administered 2019-08-20: 6 via TOPICAL

## 2019-08-20 MED ORDER — MINOXIDIL 2.5 MG PO TABS: 5.0000 mg | ORAL_TABLET | Freq: Two times a day (BID) | ORAL | 0 refills | Status: AC

## 2019-08-20 MED ORDER — OXYCODONE-ACETAMINOPHEN 5-325 MG PO TABS: 1.0000 | ORAL_TABLET | ORAL | 0 refills | Status: DC | PRN

## 2019-08-20 MED ORDER — MINOXIDIL 2.5 MG PO TABS
5.0000 mg | ORAL_TABLET | Freq: Two times a day (BID) | ORAL | Status: DC
  Administered 2019-08-20: 5 mg via ORAL
  Filled 2019-08-20 (×2): qty 2

## 2019-08-20 MED ORDER — PIPERACILLIN-TAZOBACTAM IN DEX 2-0.25 GM/50ML IV SOLN
2.2500 g | Freq: Three times a day (TID) | INTRAVENOUS | Status: DC
  Filled 2019-08-20: qty 50

## 2019-08-20 NOTE — Progress Notes (Addendum)
Pharmacy Antibiotic Note  William [name] is a 33 y.o. male admitted on 08/18/2019 with AV graft infection s/p placement a few days ago.  Pharmacy has been consulted for vancomycin and Zosyn dosing for AV graft infection.  WBC stable at 11.7. Afebrile.   Currently on vancomycin 750mg  IV post-HD and Zosyn 3.375g q12h (4 hour infusion). Will adjust Zosyn to facilitate easier administration of Zosyn with HD.    Plan: Continue vancomycin 750mg  qHD on MWF Follow up standing weight and adjust vancomycin to 1000mg  qHD if <80kg Change Zosyn to 2.25g q8h starting at 2100 on 9/6  Follow C/S, clinical progression, and HD schedule    Temp (24hrs), Avg:98.3 F (36.8 C), Min:97.5 F (36.4 C), Max:99 F (37.2 C)  Recent Labs  Lab 08/18/19 2021 08/18/19 2022 08/19/19 0907 08/19/19 1639 08/19/19 1807 08/20/19 0440  WBC 11.4*  --  11.3* 10.8* 11.0* 11.7*  CREATININE 5.69*  --  7.63*  --   --  10.18*  LATICACIDVEN  --  1.1 0.8  --   --   --     Estimated Creatinine Clearance: 10.2 mL/min (A) (by C-G formula based on SCr of 10.18 mg/dL (H)).    Allergies  Allergen Reactions  . Reglan [Metoclopramide] Nausea And Vomiting    Develops akathisia    Antimicrobials this admission: Vancomycin 9/5 >> Zosyn 9/5 >>  Antimicrobial adjustments this admission: 9/6: Change zosyn 3.375g q12h to Zosyn 2.25g q8h   Microbiology results: 9/4 BCx: ng x2 days  9/6 MRSA PCR: sent     Thank you for allowing pharmacy to be a part of this patient's care.  Cristela Felt, PharmD PGY1 Pharmacy Resident [employer]: [phone]  08/20/2019 10:04 AM

## 2019-08-20 NOTE — Progress Notes (Signed)
DISCHARGE NOTE HOME William [name] to be discharged Home per MD order. Discussed prescriptions and follow up appointments with the patient. Prescriptions given to patient; medication list explained in detail. Patient verbalized understanding.  Skin clean, dry and intact without evidence of skin break down, no evidence of skin tears noted. IV catheter discontinued intact. Site without signs and symptoms of complications. Dressing and pressure applied. Pt denies pain at the site currently. No complaints noted.  Patient free of lines, drains, and wounds.   An After Visit Summary (AVS) was printed and given to the patient. Patient escorted via wheelchair, and discharged home via private auto.  [name] BSN, RN3

## 2019-08-20 NOTE — Plan of Care (Signed)

## 2019-08-20 NOTE — Progress Notes (Signed)
    Subjective  -   Right arm feels and looks better   Physical Exam:  Significant improvement in right arm edema Erythema and warmth improved       Assessment/Plan:    The patient's arm continues to improve.  I would not recommend removal of the graft at this time as I am not convinced this represents a graft infection.  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  I would recommend the following:  --continue IV abx at dialysis for 2 weeks --Keep arm elevated and wrapped with ACE when possible --Follow up Dr Scot Dock 2 weeks to evaluate for possible shuntogram if the edema has not completely resolved --discharge home    William [name] 08/20/2019 9:05 AM --  Vitals:   08/19/19 1947 08/20/19 0451  BP:  (!) 192/101  Pulse: 92 (!) 101  Resp: 16 16  Temp: 98.2 F (36.8 C) (!) 97.5 F (36.4 C)  SpO2: 99% 98%    Intake/Output Summary (Last 24 hours) at 08/20/2019 0905 Last data filed at 08/20/2019 0845 Gross per 24 hour  Intake 880.04 ml  Output 0 ml  Net 880.04 ml     Laboratory CBC    Component Value Date/Time   WBC 11.7 (H) 08/20/2019 0440   HGB 7.1 (L) 08/20/2019 0440   HCT 22.8 (L) 08/20/2019 0440   PLT 368 08/20/2019 0440    BMET    Component Value Date/Time   NA 137 08/20/2019 0440   K 3.5 08/20/2019 0440   CL 93 (L) 08/20/2019 0440   CO2 28 08/20/2019 0440   GLUCOSE 110 (H) 08/20/2019 0440   BUN 27 (H) 08/20/2019 0440   CREATININE 10.18 (H) 08/20/2019 0440   CALCIUM 9.6 08/20/2019 0440   GFRNONAA 6 (L) 08/20/2019 0440   GFRAA 7 (L) 08/20/2019 0440    [facility]AG Lab Results  Component Value Date   INR 1.3 (H) 08/18/2019   No results found for: PTT  Antibiotics Anti-infectives (From admission, onward)   Start     Dose/Rate Route Frequency Ordered Stop   08/21/19 1200  vancomycin (VANCOCIN) IVPB 750 mg/150 ml premix     750 mg 150 mL/hr over 60 Minutes Intravenous Every M-W-F (Hemodialysis) 08/19/19 1434     08/19/19 1000   piperacillin-tazobactam (ZOSYN) IVPB 3.375 g     3.375 g 12.5 mL/hr over 240 Minutes Intravenous Every 12 hours 08/19/19 0131     08/19/19 0145  vancomycin (VANCOCIN) 1,750 mg in sodium chloride 0.9 % 500 mL IVPB     1,750 mg 250 mL/hr over 120 Minutes Intravenous  Once 08/19/19 0131 08/19/19 0451   08/18/19 2315  piperacillin-tazobactam (ZOSYN) IVPB 3.375 g     3.375 g 100 mL/hr over 30 Minutes Intravenous  Once 08/18/19 2309 08/19/19 0201       V. Leia Alf, M.D., [facility] [place] Office: [phone] Pager:  [phone]

## 2019-08-20 NOTE — Progress Notes (Addendum)
[facility] Renal Note:  See that Dr. Trula Slade has cleared him for discharge.  FYI - outpatient HD units cannot give Zosyn.   We can certainly arrange Vancomycin and alternative gram negative agent (typically use Ceftazidime) with his HD for 2 week course.  I have ordered the Vanc/Ceftaz with HD x 2 weeks - pls page renal if any changes requested.  Veneta Penton, PA-C [employer] Pager [phone]

## 2019-08-20 NOTE — Discharge Summary (Signed)
Physician Discharge Summary  William [name] [id number] DOB: Apr 08, 1986 DOA: 08/18/2019  PCP: Patient, No Pcp Per  Admit date: 08/18/2019 Discharge date: 08/20/2019  Admitted From: home Disposition:  home   Recommendations for Outpatient Follow-up:  1. Will need VAnc and Zosyn x 2 wks with dialysis 2. Based on weight, Vanc dose will be 750 mg post dialysis.   Discharge Condition:  stable   CODE STATUS:  Full code   Diet recommendation:  Renal diet Consultations:  Vascular surgery  Phone consult with Nephro    Discharge Diagnoses:  Principal Problem:   Infection of AV graft for dialysis ([place]) Active Problems:   Anemia in ESRD (end-stage renal disease) ([place])   Hypokalemia   Symptomatic anemia   HTN (hypertension)   H/o Stroke ([place])   HLD (hyperlipidemia)   Tobacco abuse   ESRD on dialysis [facility])  h/o cocaine abuse       Brief Summary: William [name]  is a 33 y.o.malewith medical history significant ofhypertension, hyperlipidemia, stroke, ESRD-HD (MWF), anemia, polysubstance abuse, cocaine abuse, tobacco abuse, who presents with right arm pain, swelling, fever and chills. Pt hadAV graftplaced on 08/15/19 by Dr. Doren Custard. He states that he has been having pain and swelling in right arm in thepast3days, which has been progressively worsening with fever chills and weakness. Patient states that he diddialysis on Friday morning.He reports that he was given an unknown antibiotic during dialysis.  Vascular surgery evaluated him in ED and recommended to remove the graft but patient wanted to salvage it. Dr Trula Slade then decided that he stay for IV antibiotics  Hospital Course:  Infection of AV graft for dialysis  temp 100.2, HR in 100-120 range, WBC count 11.4- does not qualify as sepsis - Vanc and Zosyn started- improving- vascular surgery following -Cultures show no growth x2 days-vascular surgery has evaluated the patient today and feels that he can be discharged  with IV antibiotics x2 weeks - The patient will be discharged with vancomycin and Zosyn to be given with dialysis -I will prescribe him oxycodone for pain  Active Problems:   HTN (hypertension) - all home meds resumed> Norvasc, Coreg, Minoxidil, Aldactone, Hydralazine -Due to continued elevated blood pressures, minoxidil dose has been doubled from 2.5 to 5 mg twice daily-the patient does state that even at home on a normal day his systolic blood pressure is usually 1 60-1 70    Anemia in ESRD (end-stage renal disease)  - given 1 U PRBC for Hb of 6.4 - repeat Hb is still around 7-I have spoken with nephrology regarding a transfusion and they feel that we can hold off- nephrology will follow-up hemoglobin levels as outpatient - normal ferretin level - B12 338  H/o CVA in 7/20 - cont ASA- d/c Plavix (on 7/10 neurologist stated that he should stop plavix in 3 wks) - cont Lipitor     ESRD on dialysis  - MWF dialysis- received dialysis on Friday-  Nicotine abuse - cont Nicotine patch  H/o Cocaine abuse     Discharge Exam: Vitals:   08/20/19 0451 08/20/19 0935  BP: (!) 192/101 (!) 157/97  Pulse: (!) 101 (!) 108  Resp: 16 18  Temp: (!) 97.5 F (36.4 C) 99 F (37.2 C)  SpO2: 98% 97%   Vitals:   08/19/19 1947 08/20/19 0451 08/20/19 0935 08/20/19 1138  BP: 131/73 (!) 192/101 (!) 157/97   Pulse: 92 (!) 101 (!) 108   Resp: 16 16 18    Temp: 98.2 F (36.8 C) (!)  97.5 F (36.4 C) 99 F (37.2 C)   TempSrc: Oral Oral Oral   SpO2: 99% 98% 97%   Weight:    80.9 kg  Height:    5\' 9"  (1.753 m)    General: Pt is alert, awake, not in acute distress Cardiovascular: RRR, S1/S2 +, no rubs, no gallops Respiratory: CTA bilaterally, no wheezing, no rhonchi Abdominal: Soft, NT, ND, bowel sounds + Extremities: no edema, no cyanosis   Discharge Instructions  Discharge Instructions    Diet - low sodium heart healthy   Complete by: As directed    Increase activity slowly    Complete by: As directed      Allergies as of 08/20/2019      Reactions   Reglan [metoclopramide] Nausea And Vomiting   Develops akathisia      Medication List    STOP taking these medications   clopidogrel 75 MG tablet Commonly known as: PLAVIX   HYDROcodone-acetaminophen 5-325 MG tablet Commonly known as: Norco     TAKE these medications   amLODipine 10 MG tablet Commonly known as: NORVASC Take 1 tablet (10 mg total) by mouth at bedtime.   aspirin 81 MG chewable tablet Chew 1 tablet (81 mg total) by mouth daily. What changed: Another medication with the same name was removed. Continue taking this medication, and follow the directions you see here.   atorvastatin 40 MG tablet Commonly known as: LIPITOR Take 1 tablet (40 mg total) by mouth daily at 6 PM.   carvedilol 25 MG tablet Commonly known as: COREG Take 1 tablet (25 mg total) by mouth 2 (two) times daily with a meal.   chlorproMAZINE 10 MG tablet Commonly known as: THORAZINE Take 1 tablet (10 mg total) by mouth 3 (three) times daily as needed for hiccoughs.   hydrALAZINE 100 MG tablet Commonly known as: APRESOLINE Take 1 tablet (100 mg total) by mouth every 8 (eight) hours.   minoxidil 2.5 MG tablet Commonly known as: LONITEN Take 2 tablets (5 mg total) by mouth 2 (two) times daily. What changed: how much to take   nicotine 7 mg/24hr patch Commonly known as: NICODERM CQ - dosed in mg/24 hr Place 7 mg onto the skin daily.   oxyCODONE-acetaminophen 5-325 MG tablet Commonly known as: PERCOCET/ROXICET Take 1 tablet by mouth every 4 (four) hours as needed for moderate pain.   spironolactone 25 MG tablet Commonly known as: ALDACTONE Take 25 mg by mouth daily.       Allergies  Allergen Reactions  . Reglan [Metoclopramide] Nausea And Vomiting    Develops akathisia     Procedures/Studies:    Dg Chest 2 View  Result Date: 08/08/2019 CLINICAL DATA:  Shortness of breath EXAM: CHEST - 2 VIEW  COMPARISON:  06/21/2019 FINDINGS: Small left pleural effusion. No focal airspace consolidation or pulmonary edema. No pneumothorax. Normal cardiomediastinal contours. IMPRESSION: Small left pleural effusion Electronically Signed   By: Ulyses Jarred M.D.   On: 08/08/2019 00:49   Dg Chest Port 1 View  Result Date: 08/19/2019 CLINICAL DATA:  Fever EXAM: PORTABLE CHEST 1 VIEW COMPARISON:  08/08/2019 FINDINGS: Right dialysis catheter remains in place, unchanged. Cardiomegaly, vascular congestion and bibasilar airspace opacities. Small bilateral effusions. No acute bony abnormality. IMPRESSION: Cardiomegaly with vascular congestion. Bilateral effusions with lower lobe airspace opacities. Findings could reflect atelectasis or infiltrates. Airspace opacity has improved slightly on the left, increasing slightly on the right. Electronically Signed   By: Rolm Baptise M.D.   On: 08/19/2019 00:18  Dg Chest Port 1 View  Result Date: 08/08/2019 CLINICAL DATA:  Insertion of dialysis catheter EXAM: PORTABLE CHEST 1 VIEW COMPARISON:  08/08/2019 FINDINGS: Right internal jugular dialysis catheter is been placed with the tip at the cavoatrial junction. No pneumothorax. Cardiomegaly. Layering left effusion with left lower lobe atelectasis or infiltrate. Minimal right base atelectasis. Mild vascular congestion. IMPRESSION: Right dialysis catheter placement with the tip at the cavoatrial junction. No pneumothorax. Cardiomegaly, vascular congestion. Layering left effusion with left base atelectasis or infiltrate. Right base atelectasis. Electronically Signed   By: Rolm Baptise M.D.   On: 08/08/2019 18:31   Dg Fluoro Guide Cv Line-no Report  Result Date: 08/08/2019 Fluoroscopy was utilized by the requesting physician.  No radiographic interpretation.   Vas [place] Upper Ext Vein Mapping (pre-op Avf)  Result Date: 08/08/2019 UPPER EXTREMITY VEIN MAPPING  Indications: Pre-access. Comparison Study: No prior studies. Performing  Technologist: Oliver Hum RVT  Examination Guidelines: A complete evaluation includes B-mode imaging, spectral Doppler, color Doppler, and power Doppler as needed of all accessible portions of each vessel. Bilateral testing is considered an integral part of a complete examination. Limited examinations for reoccurring indications may be performed as noted. +-----------------+-------------+----------+--------+ Right Cephalic   Diameter (cm)Depth (cm)Findings +-----------------+-------------+----------+--------+ Shoulder             0.22        0.48            +-----------------+-------------+----------+--------+ Prox upper arm       0.17        0.22            +-----------------+-------------+----------+--------+ Mid upper arm        0.19        0.36            +-----------------+-------------+----------+--------+ Dist upper arm       0.27        0.18            +-----------------+-------------+----------+--------+ Antecubital fossa    0.20        0.19            +-----------------+-------------+----------+--------+ Prox forearm         0.13        0.26            +-----------------+-------------+----------+--------+ Mid forearm          0.14        0.23            +-----------------+-------------+----------+--------+ Dist forearm         0.10        0.20            +-----------------+-------------+----------+--------+ +-----------------+-------------+----------+---------+ Right Basilic    Diameter (cm)Depth (cm)Findings  +-----------------+-------------+----------+---------+ Shoulder             0.24        0.72             +-----------------+-------------+----------+---------+ Dist upper arm       0.24        0.37   branching +-----------------+-------------+----------+---------+ Antecubital fossa    0.07        0.27             +-----------------+-------------+----------+---------+ Prox forearm         0.10        0.16              +-----------------+-------------+----------+---------+ Mid forearm          0.06  0.13             +-----------------+-------------+----------+---------+ Distal forearm       0.06        0.20             +-----------------+-------------+----------+---------+ +-----------------+-------------+----------+--------------+ Left Cephalic    Diameter (cm)Depth (cm)   Findings    +-----------------+-------------+----------+--------------+ Shoulder             0.26        0.43                  +-----------------+-------------+----------+--------------+ Prox upper arm       0.25        0.43                  +-----------------+-------------+----------+--------------+ Mid upper arm        0.17        0.32                  +-----------------+-------------+----------+--------------+ Dist upper arm       0.19        0.27                  +-----------------+-------------+----------+--------------+ Antecubital fossa    0.24        0.20     branching    +-----------------+-------------+----------+--------------+ Prox forearm                            not visualized +-----------------+-------------+----------+--------------+ Mid forearm          0.14        0.21                  +-----------------+-------------+----------+--------------+ Dist forearm         0.16        0.24                  +-----------------+-------------+----------+--------------+ +-----------------+-------------+----------+---------+ Left Basilic     Diameter (cm)Depth (cm)Findings  +-----------------+-------------+----------+---------+ Shoulder             0.30        0.86             +-----------------+-------------+----------+---------+ Mid upper arm        0.22        0.82             +-----------------+-------------+----------+---------+ Dist upper arm       0.21        0.52   branching +-----------------+-------------+----------+---------+ Antecubital fossa    0.11         0.17             +-----------------+-------------+----------+---------+ Prox forearm         0.06        0.12             +-----------------+-------------+----------+---------+ Mid forearm          0.08        0.12             +-----------------+-------------+----------+---------+ Distal forearm       0.08        0.18             +-----------------+-------------+----------+---------+ *See table(s) above for measurements and observations.  Diagnosing physician: Deitra Mayo MD Electronically signed by Deitra Mayo MD on 08/08/2019 at 6:36:20 PM.    Final      The results of significant  diagnostics from this hospitalization (including imaging, microbiology, ancillary and laboratory) are listed below for reference.     Microbiology: Recent Results (from the past 240 hour(s))  SARS Coronavirus 2 [facility] order, Performed in [facility] hospital lab) Nasopharyngeal Nasopharyngeal Swab     Status: None   Collection Time: 08/15/19  7:56 AM   Specimen: Nasopharyngeal Swab  Result Value Ref Range Status   SARS Coronavirus 2 NEGATIVE NEGATIVE Final    Comment: (NOTE) If result is NEGATIVE SARS-CoV-2 target nucleic acids are NOT DETECTED. The SARS-CoV-2 RNA is generally detectable in upper and lower  respiratory specimens during the acute phase of infection. The lowest  concentration of SARS-CoV-2 viral copies this assay can detect is 250  copies / mL. A negative result does not preclude SARS-CoV-2 infection  and should not be used as the sole basis for treatment or other  patient management decisions.  A negative result may occur with  improper specimen collection / handling, submission of specimen other  than nasopharyngeal swab, presence of viral mutation(s) within the  areas targeted by this assay, and inadequate number of viral copies  (<250 copies / mL). A negative result must be combined with clinical  observations, patient history, and epidemiological  information. If result is POSITIVE SARS-CoV-2 target nucleic acids are DETECTED. The SARS-CoV-2 RNA is generally detectable in upper and lower  respiratory specimens dur ing the acute phase of infection.  Positive  results are indicative of active infection with SARS-CoV-2.  Clinical  correlation with patient history and other diagnostic information is  necessary to determine patient infection status.  Positive results do  not rule out bacterial infection or co-infection with other viruses. If result is PRESUMPTIVE POSTIVE SARS-CoV-2 nucleic acids MAY BE PRESENT.   A presumptive positive result was obtained on the submitted specimen  and confirmed on repeat testing.  While 2019 novel coronavirus  (SARS-CoV-2) nucleic acids may be present in the submitted sample  additional confirmatory testing may be necessary for epidemiological  and / or clinical management purposes  to differentiate between  SARS-CoV-2 and other Sarbecovirus currently known to infect humans.  If clinically indicated additional testing with an alternate test  methodology [phone]) is advised. The SARS-CoV-2 RNA is generally  detectable in upper and lower respiratory sp ecimens during the acute  phase of infection. The expected result is Negative. Fact Sheet for Patients:  StrictlyIdeas.no Fact Sheet for Healthcare Providers: BankingDealers.co.za This test is not yet approved or cleared by the [place] FDA and has been authorized for detection and/or diagnosis of SARS-CoV-2 by FDA under an Emergency Use Authorization (EUA).  This EUA will remain in effect (meaning this test can be used) for the duration of the COVID-19 declaration under Section 564(b)(1) of the Act, 21 U.S.C. section 360bbb-3(b)(1), unless the authorization is terminated or revoked sooner. Performed at [facility], [place] [address], [place], [place] [address]   Blood culture (routine x 2)      Status: None (Preliminary result)   Collection Time: 08/18/19  8:15 PM   Specimen: BLOOD RIGHT ARM  Result Value Ref Range Status   Specimen Description BLOOD RIGHT ARM  Final   Special Requests   Final    BOTTLES DRAWN AEROBIC AND ANAEROBIC Blood Culture results may not be optimal due to an excessive volume of blood received in culture bottles   Culture   Final    NO GROWTH 2 DAYS Performed at [facility], [place] [address],  [place], [place] [address]    Report Status PENDING  Incomplete  Blood culture (routine x 2)     Status: None (Preliminary result)   Collection Time: 08/18/19  8:22 PM   Specimen: BLOOD RIGHT HAND  Result Value Ref Range Status   Specimen Description BLOOD RIGHT HAND  Final   Special Requests   Final    BOTTLES DRAWN AEROBIC ONLY Blood Culture results may not be optimal due to an excessive volume of blood received in culture bottles   Culture   Final    NO GROWTH 2 DAYS Performed at [facility], [place] [address] [place], [place] [address]    Report Status PENDING  Incomplete  SARS Coronavirus 2 [facility] order, Performed in [facility] hospital lab) Nasopharyngeal Nasopharyngeal Swab     Status: None   Collection Time: 08/19/19  2:40 PM   Specimen: Nasopharyngeal Swab  Result Value Ref Range Status   SARS Coronavirus 2 NEGATIVE NEGATIVE Final    Comment: (NOTE) If result is NEGATIVE SARS-CoV-2 target nucleic acids are NOT DETECTED. The SARS-CoV-2 RNA is generally detectable in upper and lower  respiratory specimens during the acute phase of infection. The lowest  concentration of SARS-CoV-2 viral copies this assay can detect is 250  copies / mL. A negative result does not preclude SARS-CoV-2 infection  and should not be used as the sole basis for treatment or other  patient management decisions.  A negative result may occur with  improper specimen collection / handling, submission of specimen other  than nasopharyngeal swab, presence of viral  mutation(s) within the  areas targeted by this assay, and inadequate number of viral copies  (<250 copies / mL). A negative result must be combined with clinical  observations, patient history, and epidemiological information. If result is POSITIVE SARS-CoV-2 target nucleic acids are DETECTED. The SARS-CoV-2 RNA is generally detectable in upper and lower  respiratory specimens dur ing the acute phase of infection.  Positive  results are indicative of active infection with SARS-CoV-2.  Clinical  correlation with patient history and other diagnostic information is  necessary to determine patient infection status.  Positive results do  not rule out bacterial infection or co-infection with other viruses. If result is PRESUMPTIVE POSTIVE SARS-CoV-2 nucleic acids MAY BE PRESENT.   A presumptive positive result was obtained on the submitted specimen  and confirmed on repeat testing.  While 2019 novel coronavirus  (SARS-CoV-2) nucleic acids may be present in the submitted sample  additional confirmatory testing may be necessary for epidemiological  and / or clinical management purposes  to differentiate between  SARS-CoV-2 and other Sarbecovirus currently known to infect humans.  If clinically indicated additional testing with an alternate test  methodology [phone]) is advised. The SARS-CoV-2 RNA is generally  detectable in upper and lower respiratory sp ecimens during the acute  phase of infection. The expected result is Negative. Fact Sheet for Patients:  StrictlyIdeas.no Fact Sheet for Healthcare Providers: BankingDealers.co.za This test is not yet approved or cleared by the [place] FDA and has been authorized for detection and/or diagnosis of SARS-CoV-2 by FDA under an Emergency Use Authorization (EUA).  This EUA will remain in effect (meaning this test can be used) for the duration of the COVID-19 declaration under Section 564(b)(1)  of the Act, 21 U.S.C. section 360bbb-3(b)(1), unless the authorization is terminated or revoked sooner. Performed at [facility], [place] [address] [place], [place] [address]   MRSA PCR Screening  Status: None   Collection Time: 08/20/19  9:10 AM   Specimen: Nasopharyngeal  Result Value Ref Range Status   MRSA by PCR NEGATIVE NEGATIVE Final    Comment:        The GeneXpert MRSA Assay (FDA approved for NASAL specimens only), is one component of a comprehensive MRSA colonization surveillance program. It is not intended to diagnose MRSA infection nor to guide or monitor treatment for MRSA infections. Performed at [facility], [place] [address], [place], [place] [address]      Labs: BNP (last 3 results) No results for input(s): BNP in the last 8760 hours. Basic Metabolic Panel: Recent Labs  Lab 08/15/19 0837 08/18/19 2021 08/19/19 0907 08/20/19 0440  NA 135 138 136 137  K 3.8 3.4* 3.7 3.5  CL  --  95* 94* 93*  CO2  --  30 26 28   GLUCOSE 92 127* 120* 110*  BUN  --  11 19 27*  CREATININE  --  5.69* 7.63* 10.18*  CALCIUM  --  8.7* 9.2 9.6   Liver Function Tests: Recent Labs  Lab 08/18/19 2021  AST 17  ALT 5  ALKPHOS 49  BILITOT 0.6  PROT 6.9  ALBUMIN 3.5   No results for input(s): LIPASE, AMYLASE in the last 168 hours. No results for input(s): AMMONIA in the last 168 hours. CBC: Recent Labs  Lab 08/18/19 2021 08/19/19 0907 08/19/19 1639 08/19/19 1807 08/20/19 0440  WBC 11.4* 11.3* 10.8* 11.0* 11.7*  NEUTROABS 8.7*  --   --   --   --   HGB 6.4* 6.4* 6.2* 6.7* 7.1*  HCT 21.3* 20.8* 20.1* 21.8* 22.8*  MCV 100.0 100.5* 98.0 99.5 99.1  PLT 296 270 256 283 368   Cardiac Enzymes: No results for input(s): CKTOTAL, CKMB, CKMBINDEX, TROPONINI in the last 168 hours. BNP: Invalid input(s): POCBNP CBG: Recent Labs  Lab 08/20/19 0656  GLUCAP 105*   D-Dimer No results for input(s): DDIMER in the last 72 hours. Hgb A1c No results for input(s):  HGBA1C in the last 72 hours. Lipid Profile No results for input(s): CHOL, HDL, LDLCALC, TRIG, CHOLHDL, LDLDIRECT in the last 72 hours. Thyroid function studies No results for input(s): TSH, T4TOTAL, T3FREE, THYROIDAB in the last 72 hours.  Invalid input(s): FREET3 Anemia work up Recent Labs    08/18/19 2358  VITAMINB12 338  FOLATE 10.1  FERRITIN 266  TIBC 239*  IRON 18*  RETICCTPCT 6.5*   Urinalysis    Component Value Date/Time   COLORURINE YELLOW 08/08/2019 1440   APPEARANCEUR HAZY (A) 08/08/2019 1440   LABSPEC 1.012 08/08/2019 1440   PHURINE 5.0 08/08/2019 1440   GLUCOSEU NEGATIVE 08/08/2019 1440   HGBUR NEGATIVE 08/08/2019 1440   [place] 08/08/2019 1440   KETONESUR NEGATIVE 08/08/2019 1440   PROTEINUR >=300 (A) 08/08/2019 1440   NITRITE NEGATIVE 08/08/2019 1440   LEUKOCYTESUR NEGATIVE 08/08/2019 1440   Sepsis Labs Invalid input(s): PROCALCITONIN,  WBC,  LACTICIDVEN Microbiology Recent Results (from the past 240 hour(s))  SARS Coronavirus 2 [facility] order, Performed in [facility] hospital lab) Nasopharyngeal Nasopharyngeal Swab     Status: None   Collection Time: 08/15/19  7:56 AM   Specimen: Nasopharyngeal Swab  Result Value Ref Range Status   SARS Coronavirus 2 NEGATIVE NEGATIVE Final    Comment: (NOTE) If result is NEGATIVE SARS-CoV-2 target nucleic acids are NOT DETECTED. The SARS-CoV-2 RNA is generally detectable in upper and lower  respiratory specimens during the acute phase of infection.  The lowest  concentration of SARS-CoV-2 viral copies this assay can detect is 250  copies / mL. A negative result does not preclude SARS-CoV-2 infection  and should not be used as the sole basis for treatment or other  patient management decisions.  A negative result may occur with  improper specimen collection / handling, submission of specimen other  than nasopharyngeal swab, presence of viral mutation(s) within the  areas targeted by this assay, and  inadequate number of viral copies  (<250 copies / mL). A negative result must be combined with clinical  observations, patient history, and epidemiological information. If result is POSITIVE SARS-CoV-2 target nucleic acids are DETECTED. The SARS-CoV-2 RNA is generally detectable in upper and lower  respiratory specimens dur ing the acute phase of infection.  Positive  results are indicative of active infection with SARS-CoV-2.  Clinical  correlation with patient history and other diagnostic information is  necessary to determine patient infection status.  Positive results do  not rule out bacterial infection or co-infection with other viruses. If result is PRESUMPTIVE POSTIVE SARS-CoV-2 nucleic acids MAY BE PRESENT.   A presumptive positive result was obtained on the submitted specimen  and confirmed on repeat testing.  While 2019 novel coronavirus  (SARS-CoV-2) nucleic acids may be present in the submitted sample  additional confirmatory testing may be necessary for epidemiological  and / or clinical management purposes  to differentiate between  SARS-CoV-2 and other Sarbecovirus currently known to infect humans.  If clinically indicated additional testing with an alternate test  methodology [phone]) is advised. The SARS-CoV-2 RNA is generally  detectable in upper and lower respiratory sp ecimens during the acute  phase of infection. The expected result is Negative. Fact Sheet for Patients:  StrictlyIdeas.no Fact Sheet for Healthcare Providers: BankingDealers.co.za This test is not yet approved or cleared by the [place] FDA and has been authorized for detection and/or diagnosis of SARS-CoV-2 by FDA under an Emergency Use Authorization (EUA).  This EUA will remain in effect (meaning this test can be used) for the duration of the COVID-19 declaration under Section 564(b)(1) of the Act, 21 U.S.C. section 360bbb-3(b)(1), unless the  authorization is terminated or revoked sooner. Performed at [facility], [place] [address], [place], [place] [address]   Blood culture (routine x 2)     Status: None (Preliminary result)   Collection Time: 08/18/19  8:15 PM   Specimen: BLOOD RIGHT ARM  Result Value Ref Range Status   Specimen Description BLOOD RIGHT ARM  Final   Special Requests   Final    BOTTLES DRAWN AEROBIC AND ANAEROBIC Blood Culture results may not be optimal due to an excessive volume of blood received in culture bottles   Culture   Final    NO GROWTH 2 DAYS Performed at [facility], [place] [address] [place], [place] [address]    Report Status PENDING  Incomplete  Blood culture (routine x 2)     Status: None (Preliminary result)   Collection Time: 08/18/19  8:22 PM   Specimen: BLOOD RIGHT HAND  Result Value Ref Range Status   Specimen Description BLOOD RIGHT HAND  Final   Special Requests   Final    BOTTLES DRAWN AEROBIC ONLY Blood Culture results may not be optimal due to an excessive volume of blood received in culture bottles   Culture   Final    NO GROWTH 2 DAYS Performed at [facility], [employer] [place],  [place] [address]    Report Status PENDING  Incomplete  SARS Coronavirus 2 [facility] order, Performed in [facility] hospital lab) Nasopharyngeal Nasopharyngeal Swab     Status: None   Collection Time: 08/19/19  2:40 PM   Specimen: Nasopharyngeal Swab  Result Value Ref Range Status   SARS Coronavirus 2 NEGATIVE NEGATIVE Final    Comment: (NOTE) If result is NEGATIVE SARS-CoV-2 target nucleic acids are NOT DETECTED. The SARS-CoV-2 RNA is generally detectable in upper and lower  respiratory specimens during the acute phase of infection. The lowest  concentration of SARS-CoV-2 viral copies this assay can detect is 250  copies / mL. A negative result does not preclude SARS-CoV-2 infection  and should not be used as the sole basis for treatment or other  patient management  decisions.  A negative result may occur with  improper specimen collection / handling, submission of specimen other  than nasopharyngeal swab, presence of viral mutation(s) within the  areas targeted by this assay, and inadequate number of viral copies  (<250 copies / mL). A negative result must be combined with clinical  observations, patient history, and epidemiological information. If result is POSITIVE SARS-CoV-2 target nucleic acids are DETECTED. The SARS-CoV-2 RNA is generally detectable in upper and lower  respiratory specimens dur ing the acute phase of infection.  Positive  results are indicative of active infection with SARS-CoV-2.  Clinical  correlation with patient history and other diagnostic information is  necessary to determine patient infection status.  Positive results do  not rule out bacterial infection or co-infection with other viruses. If result is PRESUMPTIVE POSTIVE SARS-CoV-2 nucleic acids MAY BE PRESENT.   A presumptive positive result was obtained on the submitted specimen  and confirmed on repeat testing.  While 2019 novel coronavirus  (SARS-CoV-2) nucleic acids may be present in the submitted sample  additional confirmatory testing may be necessary for epidemiological  and / or clinical management purposes  to differentiate between  SARS-CoV-2 and other Sarbecovirus currently known to infect humans.  If clinically indicated additional testing with an alternate test  methodology [phone]) is advised. The SARS-CoV-2 RNA is generally  detectable in upper and lower respiratory sp ecimens during the acute  phase of infection. The expected result is Negative. Fact Sheet for Patients:  StrictlyIdeas.no Fact Sheet for Healthcare Providers: BankingDealers.co.za This test is not yet approved or cleared by the [place] FDA and has been authorized for detection and/or diagnosis of SARS-CoV-2 by FDA under an  Emergency Use Authorization (EUA).  This EUA will remain in effect (meaning this test can be used) for the duration of the COVID-19 declaration under Section 564(b)(1) of the Act, 21 U.S.C. section 360bbb-3(b)(1), unless the authorization is terminated or revoked sooner. Performed at [facility], [place] [address], [place], [place] [address]   MRSA PCR Screening     Status: None   Collection Time: 08/20/19  9:10 AM   Specimen: Nasopharyngeal  Result Value Ref Range Status   MRSA by PCR NEGATIVE NEGATIVE Final    Comment:        The GeneXpert MRSA Assay (FDA approved for NASAL specimens only), is one component of a comprehensive MRSA colonization surveillance program. It is not intended to diagnose MRSA infection nor to guide or monitor treatment for MRSA infections. Performed at [facility], [place] [address] [place], [place] [address]      Time coordinating discharge in minutes: 65  SIGNED:   Debbe Odea, MD  [facility] 08/20/2019, 11:54 AM Pager   If 7PM-7AM, please contact night-coverage [email or web address] Password [email or web address]

## 2019-08-20 NOTE — Discharge Instructions (Signed)
You will nee VAncomycin and Zosyn during dialysis for 12 more days stop date will be 9/18

## 2019-08-22 ENCOUNTER — Ambulatory Visit (INDEPENDENT_AMBULATORY_CARE_PROVIDER_SITE_OTHER): Payer: Self-pay | Admitting: Physician Assistant

## 2019-08-22 ENCOUNTER — Other Ambulatory Visit: Payer: Self-pay

## 2019-08-22 VITALS — BP 145/78 | Temp 97.9°F | Resp 14 | Ht 69.0 in | Wt 178.6 lb

## 2019-08-22 DIAGNOSIS — Z992 Dependence on renal dialysis: Secondary | ICD-10-CM

## 2019-08-22 DIAGNOSIS — N186 End stage renal disease: Secondary | ICD-10-CM

## 2019-08-22 DIAGNOSIS — M7989 Other specified soft tissue disorders: Secondary | ICD-10-CM

## 2019-08-22 LAB — BPAM RBC
Blood Product Expiration Date: 202010102359
Blood Product Expiration Date: 202010102359
Unit Type and Rh: 5100
Unit Type and Rh: 5100

## 2019-08-22 LAB — TYPE AND SCREEN
ABO/RH(D): O POS
Antibody Screen: NEGATIVE
Unit division: 0
Unit division: 0

## 2019-08-22 NOTE — Progress Notes (Signed)
  POST OPERATIVE OFFICE NOTE    CC:  F/u for surgery  HPI:  This is a 33 y.o. male who is s/p edema RUE after placement of R forearm  loop graft placement9/12/2018 .  He is here for follow up visit.  He has been using compression and elevation.  He States he has decreased swelling and the pain is more tolerable.  He takes 1 pain tablet every 6-8 hours.  He is also receiving IV antibiotics after HD.     Allergies  Allergen Reactions  . Reglan [Metoclopramide] Nausea And Vomiting    Develops akathisia    Current Outpatient Medications  Medication Sig Dispense Refill  . amLODipine (NORVASC) 10 MG tablet Take 1 tablet (10 mg total) by mouth at bedtime. 30 tablet 0  . aspirin 81 MG chewable tablet Chew 1 tablet (81 mg total) by mouth daily. (Patient not taking: Reported on 08/18/2019)    . atorvastatin (LIPITOR) 40 MG tablet Take 1 tablet (40 mg total) by mouth daily at 6 PM.    . carvedilol (COREG) 25 MG tablet Take 1 tablet (25 mg total) by mouth 2 (two) times daily with a meal.    . chlorproMAZINE (THORAZINE) 10 MG tablet Take 1 tablet (10 mg total) by mouth 3 (three) times daily as needed for hiccoughs. 20 tablet 0  . hydrALAZINE (APRESOLINE) 100 MG tablet Take 1 tablet (100 mg total) by mouth every 8 (eight) hours. 30 tablet 3  . minoxidil (LONITEN) 2.5 MG tablet Take 2 tablets (5 mg total) by mouth 2 (two) times daily. 60 tablet 0  . nicotine (NICODERM CQ - DOSED IN MG/24 HR) 7 mg/24hr patch Place 7 mg onto the skin daily.    Marland Kitchen oxyCODONE-acetaminophen (PERCOCET/ROXICET) 5-325 MG tablet Take 1 tablet by mouth every 4 (four) hours as needed for moderate pain. 10 tablet 0  . spironolactone (ALDACTONE) 25 MG tablet Take 25 mg by mouth daily.     No current facility-administered medications for this visit.      ROS:  See HPI  Physical Exam:    Incision:  Well healed Extremities:  Palpable radial pulse, sensation is intact, forearm edema to just above the elbow.   Good audible thrill to  auscultation.  Assessment/Plan:  This is a 33 y.o. male who is s/p: Right forearm AV loop graft placed 08/15/2019 with edema/Gore reaction.    He is Afebrile. -He reports improvement in the edema and pain.  He wants to give the graft a chance.  He will f/u in 1 week with Dr. Scot Dock.  If he has increased pain, loss of motor or loss of sensation he will call sooner.   Roxy Horseman PA-C [facility] [phone]  Clinic MD:  Early

## 2019-08-23 ENCOUNTER — Telehealth: Payer: Self-pay | Admitting: Speech Pathology

## 2019-08-23 ENCOUNTER — Ambulatory Visit: Payer: Medicaid Other | Admitting: Physical Therapy

## 2019-08-23 ENCOUNTER — Inpatient Hospital Stay: Payer: Medicaid Other | Admitting: Adult Health

## 2019-08-23 ENCOUNTER — Ambulatory Visit: Payer: Medicaid Other | Admitting: Speech Pathology

## 2019-08-23 LAB — CULTURE, BLOOD (ROUTINE X 2)
Culture: NO GROWTH
Culture: NO GROWTH

## 2019-08-23 NOTE — Telephone Encounter (Signed)
Phoned pt 3 separate times in the pm 08/23/19, re: no show visits. Pt has been in the hospital a couple of times since his last ST visit. Each attempt to call rang busy.

## 2019-08-26 ENCOUNTER — Encounter (HOSPITAL_COMMUNITY): Payer: Self-pay | Admitting: Emergency Medicine

## 2019-08-26 ENCOUNTER — Inpatient Hospital Stay (HOSPITAL_COMMUNITY)
Admission: EM | Admit: 2019-08-26 | Discharge: 2019-08-27 | DRG: 811 | Disposition: A | Discharge status: Home / Self Care | Payer: Medicaid Other | Attending: Internal Medicine | Admitting: Internal Medicine

## 2019-08-26 ENCOUNTER — Other Ambulatory Visit: Payer: Self-pay

## 2019-08-26 DIAGNOSIS — Z20828 Contact with and (suspected) exposure to other viral communicable diseases: Secondary | ICD-10-CM | POA: Diagnosis present

## 2019-08-26 DIAGNOSIS — D509 Iron deficiency anemia, unspecified: Secondary | ICD-10-CM | POA: Diagnosis present

## 2019-08-26 DIAGNOSIS — Z79899 Other long term (current) drug therapy: Secondary | ICD-10-CM

## 2019-08-26 DIAGNOSIS — Z8249 Family history of ischemic heart disease and other diseases of the circulatory system: Secondary | ICD-10-CM

## 2019-08-26 DIAGNOSIS — I120 Hypertensive chronic kidney disease with stage 5 chronic kidney disease or end stage renal disease: Secondary | ICD-10-CM | POA: Diagnosis present

## 2019-08-26 DIAGNOSIS — Z7902 Long term (current) use of antithrombotics/antiplatelets: Secondary | ICD-10-CM

## 2019-08-26 DIAGNOSIS — Z992 Dependence on renal dialysis: Secondary | ICD-10-CM

## 2019-08-26 DIAGNOSIS — N2581 Secondary hyperparathyroidism of renal origin: Secondary | ICD-10-CM | POA: Diagnosis present

## 2019-08-26 DIAGNOSIS — N186 End stage renal disease: Secondary | ICD-10-CM | POA: Diagnosis present

## 2019-08-26 DIAGNOSIS — K922 Gastrointestinal hemorrhage, unspecified: Secondary | ICD-10-CM | POA: Diagnosis not present

## 2019-08-26 DIAGNOSIS — T827XXA Infection and inflammatory reaction due to other cardiac and vascular devices, implants and grafts, initial encounter: Secondary | ICD-10-CM | POA: Diagnosis present

## 2019-08-26 DIAGNOSIS — E8889 Other specified metabolic disorders: Secondary | ICD-10-CM | POA: Diagnosis present

## 2019-08-26 DIAGNOSIS — Z87891 Personal history of nicotine dependence: Secondary | ICD-10-CM | POA: Diagnosis not present

## 2019-08-26 DIAGNOSIS — F1210 Cannabis abuse, uncomplicated: Secondary | ICD-10-CM | POA: Diagnosis present

## 2019-08-26 DIAGNOSIS — D631 Anemia in chronic kidney disease: Secondary | ICD-10-CM | POA: Diagnosis present

## 2019-08-26 DIAGNOSIS — Z8673 Personal history of transient ischemic attack (TIA), and cerebral infarction without residual deficits: Secondary | ICD-10-CM

## 2019-08-26 DIAGNOSIS — Z8042 Family history of malignant neoplasm of prostate: Secondary | ICD-10-CM

## 2019-08-26 DIAGNOSIS — E785 Hyperlipidemia, unspecified: Secondary | ICD-10-CM | POA: Diagnosis present

## 2019-08-26 DIAGNOSIS — K2980 Duodenitis without bleeding: Secondary | ICD-10-CM | POA: Diagnosis present

## 2019-08-26 DIAGNOSIS — Z888 Allergy status to other drugs, medicaments and biological substances status: Secondary | ICD-10-CM

## 2019-08-26 DIAGNOSIS — Y832 Surgical operation with anastomosis, bypass or graft as the cause of abnormal reaction of the patient, or of later complication, without mention of misadventure at the time of the procedure: Secondary | ICD-10-CM | POA: Diagnosis present

## 2019-08-26 DIAGNOSIS — Z833 Family history of diabetes mellitus: Secondary | ICD-10-CM

## 2019-08-26 DIAGNOSIS — D649 Anemia, unspecified: Secondary | ICD-10-CM | POA: Diagnosis present

## 2019-08-26 DIAGNOSIS — F1410 Cocaine abuse, uncomplicated: Secondary | ICD-10-CM | POA: Diagnosis present

## 2019-08-26 DIAGNOSIS — K3189 Other diseases of stomach and duodenum: Secondary | ICD-10-CM | POA: Diagnosis present

## 2019-08-26 DIAGNOSIS — I10 Essential (primary) hypertension: Secondary | ICD-10-CM | POA: Diagnosis not present

## 2019-08-26 LAB — COMPREHENSIVE METABOLIC PANEL
ALT: 8 U/L (ref 0–44)
AST: 19 U/L (ref 15–41)
Albumin: 3.1 g/dL — ABNORMAL LOW (ref 3.5–5.0)
Alkaline Phosphatase: 60 U/L (ref 38–126)
Anion gap: 14 (ref 5–15)
BUN: 9 mg/dL (ref 6–20)
CO2: 30 mmol/L (ref 22–32)
Calcium: 9.4 mg/dL (ref 8.9–10.3)
Chloride: 94 mmol/L — ABNORMAL LOW (ref 98–111)
Creatinine, Ser: 7.04 mg/dL — ABNORMAL HIGH (ref 0.61–1.24)
GFR calc Af Amer: 11 mL/min — ABNORMAL LOW (ref >60)
GFR calc non Af Amer: 9 mL/min — ABNORMAL LOW (ref >60)
Glucose, Bld: 141 mg/dL — ABNORMAL HIGH (ref 70–99)
Potassium: 3.1 mmol/L — ABNORMAL LOW (ref 3.5–5.1)
Sodium: 138 mmol/L (ref 135–145)
Total Bilirubin: 0.6 mg/dL (ref 0.3–1.2)
Total Protein: 6.3 g/dL — ABNORMAL LOW (ref 6.5–8.1)

## 2019-08-26 LAB — CBC WITH DIFFERENTIAL/PLATELET
Abs Immature Granulocytes: 0.02 10*3/uL (ref 0.00–0.07)
Basophils Absolute: 0 10*3/uL (ref 0.0–0.1)
Basophils Relative: 1 %
Eosinophils Absolute: 0.2 10*3/uL (ref 0.0–0.5)
Eosinophils Relative: 3 %
HCT: 20 % — ABNORMAL LOW (ref 39.0–52.0)
Hemoglobin: 6.3 g/dL — CL (ref 13.0–17.0)
Immature Granulocytes: 0 %
Lymphocytes Relative: 21 %
Lymphs Abs: 1.2 10*3/uL (ref 0.7–4.0)
MCH: 30.6 pg (ref 26.0–34.0)
MCHC: 31.5 g/dL (ref 30.0–36.0)
MCV: 97.1 fL (ref 80.0–100.0)
Monocytes Absolute: 0.6 10*3/uL (ref 0.1–1.0)
Monocytes Relative: 11 %
Neutro Abs: 3.6 10*3/uL (ref 1.7–7.7)
Neutrophils Relative %: 64 %
Platelets: 358 10*3/uL (ref 150–400)
RBC: 2.06 MIL/uL — ABNORMAL LOW (ref 4.22–5.81)
RDW: 13.6 % (ref 11.5–15.5)
WBC: 5.7 10*3/uL (ref 4.0–10.5)
nRBC: 0 % (ref 0.0–0.2)

## 2019-08-26 LAB — PREPARE RBC (CROSSMATCH)

## 2019-08-26 LAB — PROTIME-INR
INR: 1.2 (ref 0.8–1.2)
Prothrombin Time: 14.6 seconds (ref 11.4–15.2)

## 2019-08-26 LAB — POC OCCULT BLOOD, ED: Fecal Occult Bld: POSITIVE — AB

## 2019-08-26 LAB — SARS CORONAVIRUS 2 BY RT PCR (HOSPITAL ORDER, PERFORMED IN ~~LOC~~ HOSPITAL LAB): SARS Coronavirus 2: NEGATIVE

## 2019-08-26 MED ORDER — SODIUM CHLORIDE 0.9% IV SOLUTION
Freq: Once | INTRAVENOUS | Status: AC
  Administered 2019-08-26: 14:00:00 via INTRAVENOUS

## 2019-08-26 MED ORDER — HYDROCODONE-ACETAMINOPHEN 5-325 MG PO TABS
1.0000 | ORAL_TABLET | ORAL | Status: DC | PRN
  Administered 2019-08-26: 2 via ORAL
  Administered 2019-08-26: 21:00:00 1 via ORAL
  Administered 2019-08-27 (×2): 2 via ORAL
  Filled 2019-08-26 (×3): qty 2
  Filled 2019-08-26: qty 1

## 2019-08-26 MED ORDER — INFLUENZA VAC SPLIT QUAD 0.5 ML IM SUSY
0.5000 mL | PREFILLED_SYRINGE | INTRAMUSCULAR | Status: DC
  Filled 2019-08-26: qty 0.5

## 2019-08-26 MED ORDER — CHLORPROMAZINE HCL 10 MG PO TABS
10.0000 mg | ORAL_TABLET | Freq: Three times a day (TID) | ORAL | Status: DC | PRN
  Filled 2019-08-26: qty 1

## 2019-08-26 MED ORDER — MORPHINE SULFATE (PF) 2 MG/ML IV SOLN: 2.0000 mg | INTRAVENOUS | Status: DC | PRN

## 2019-08-26 MED ORDER — SPIRONOLACTONE 25 MG PO TABS: 25.0000 mg | ORAL_TABLET | Freq: Every day | ORAL | Status: DC

## 2019-08-26 MED ORDER — ONDANSETRON HCL 4 MG/2ML IJ SOLN: 4.0000 mg | Freq: Four times a day (QID) | INTRAMUSCULAR | Status: DC | PRN

## 2019-08-26 MED ORDER — NICOTINE 7 MG/24HR TD PT24
7.0000 mg | MEDICATED_PATCH | Freq: Every day | TRANSDERMAL | Status: DC
  Administered 2019-08-26 – 2019-08-27 (×2): 7 mg via TRANSDERMAL
  Filled 2019-08-26 (×2): qty 1

## 2019-08-26 MED ORDER — SUCROFERRIC OXYHYDROXIDE 500 MG PO CHEW
500.0000 mg | CHEWABLE_TABLET | Freq: Three times a day (TID) | ORAL | Status: DC
  Administered 2019-08-26: 500 mg via ORAL
  Filled 2019-08-26 (×4): qty 1

## 2019-08-26 MED ORDER — CARVEDILOL 12.5 MG PO TABS
25.0000 mg | ORAL_TABLET | Freq: Two times a day (BID) | ORAL | Status: DC
  Administered 2019-08-26 – 2019-08-27 (×2): 25 mg via ORAL
  Filled 2019-08-26 (×2): qty 2

## 2019-08-26 MED ORDER — SODIUM CHLORIDE 0.9 % IV SOLN
INTRAVENOUS | Status: DC
  Administered 2019-08-26 – 2019-08-27 (×2): via INTRAVENOUS

## 2019-08-26 MED ORDER — ACETAMINOPHEN 325 MG PO TABS: 650.0000 mg | ORAL_TABLET | Freq: Four times a day (QID) | ORAL | Status: DC | PRN

## 2019-08-26 MED ORDER — SODIUM CHLORIDE 0.9% FLUSH: 3.0000 mL | INTRAVENOUS | Status: DC | PRN

## 2019-08-26 MED ORDER — PNEUMOCOCCAL VAC POLYVALENT 25 MCG/0.5ML IJ INJ
0.5000 mL | INJECTION | INTRAMUSCULAR | Status: DC
  Filled 2019-08-26: qty 0.5

## 2019-08-26 MED ORDER — ONDANSETRON HCL 4 MG PO TABS: 4.0000 mg | ORAL_TABLET | Freq: Four times a day (QID) | ORAL | Status: DC | PRN

## 2019-08-26 MED ORDER — SODIUM CHLORIDE 0.9 % IV SOLN: 250.0000 mL | INTRAVENOUS | Status: DC | PRN

## 2019-08-26 MED ORDER — ATORVASTATIN CALCIUM 40 MG PO TABS
40.0000 mg | ORAL_TABLET | Freq: Every day | ORAL | Status: DC
  Administered 2019-08-26: 40 mg via ORAL
  Filled 2019-08-26: qty 1

## 2019-08-26 MED ORDER — SODIUM CHLORIDE 0.9 % IV SOLN
8.0000 mg/h | INTRAVENOUS | Status: DC
  Administered 2019-08-26 – 2019-08-27 (×2): 8 mg/h via INTRAVENOUS
  Filled 2019-08-26 (×4): qty 80

## 2019-08-26 MED ORDER — HYDRALAZINE HCL 50 MG PO TABS
100.0000 mg | ORAL_TABLET | Freq: Three times a day (TID) | ORAL | Status: DC
  Administered 2019-08-26 – 2019-08-27 (×4): 100 mg via ORAL
  Filled 2019-08-26 (×4): qty 2

## 2019-08-26 MED ORDER — SODIUM CHLORIDE 0.9 % IV SOLN
80.0000 mg | Freq: Once | INTRAVENOUS | Status: AC
  Administered 2019-08-26: 80 mg via INTRAVENOUS
  Filled 2019-08-26: qty 80

## 2019-08-26 MED ORDER — ACETAMINOPHEN 650 MG RE SUPP: 650.0000 mg | Freq: Four times a day (QID) | RECTAL | Status: DC | PRN

## 2019-08-26 MED ORDER — CLOPIDOGREL BISULFATE 75 MG PO TABS: 75.0000 mg | ORAL_TABLET | Freq: Every day | ORAL | Status: DC

## 2019-08-26 MED ORDER — MINOXIDIL 2.5 MG PO TABS
5.0000 mg | ORAL_TABLET | Freq: Two times a day (BID) | ORAL | Status: DC
  Administered 2019-08-26: 5 mg via ORAL
  Filled 2019-08-26 (×2): qty 2

## 2019-08-26 MED ORDER — SODIUM CHLORIDE 0.9% FLUSH
3.0000 mL | Freq: Two times a day (BID) | INTRAVENOUS | Status: DC
  Administered 2019-08-26 – 2019-08-27 (×3): 3 mL via INTRAVENOUS

## 2019-08-26 MED ORDER — RENA-VITE PO TABS
1.0000 | ORAL_TABLET | Freq: Every day | ORAL | Status: DC
  Administered 2019-08-26: 1 via ORAL
  Filled 2019-08-26 (×2): qty 1

## 2019-08-26 NOTE — Consult Note (Signed)
[facility] Consult  Referring Provider: Isla Pence, MD/ER Primary Care Physician:  Patient, No Pcp Per Primary Gastroenterologist: Althia Forts  Reason for Consultation: Anemia, black stools  HPI: William [name] is a 33 y.o. male is an end-stage renal dialysis patient recently started on hemodialysis(08/08/2019), history of infected right AV graft, currently on dialysis Monday Wednesday Friday was sent to the ER due to profound anemia noted as an outpatient.  Patient states he has been having dark stools intermittently although he denies frank bleeding, abdominal pain or vomiting of blood.  He denies use of NSAIDs and recent alcohol use.  No prior EGD. He has history of drug abuse/cocaine and history of stroke without obvious neurologic deficits. Patient denies nausea, vomiting, acid reflux, heartburn, difficulty swallowing or pain on swallowing.  He denies unintentional weight loss or loss of appetite.  Past Medical History:  Diagnosis Date  . Anemia   . Drug abuse, cocaine type ([place]) 06/23/2019  . ESRD (end stage renal disease) ([place]) 08/08/2019  . Hyperlipidemia   . Hypertension   . Stroke [facility]) 06/2019    Past Surgical History:  Procedure Laterality Date  . AV FISTULA PLACEMENT Right 08/15/2019   Procedure: INSERTION OF ARTERIOVENOUS (AV) GORE-TEX GRAFT ARM. Using 4-30mm STRETCH Goretex.;  Surgeon: Angelia Mould, MD;  Location: [facility] OR;  Service: Vascular;  Laterality: Right;  . INSERTION OF DIALYSIS CATHETER Right 08/08/2019   Procedure: INSERTION OF right internal jugular DIALYSIS CATHETER;  Surgeon: Angelia Mould, MD;  Location: [facility];  Service: Vascular;  Laterality: Right;  . RADIOLOGY WITH ANESTHESIA N/A 06/23/2019   Procedure: MRI OF VESSELS;  Surgeon: Radiologist, Medication, MD;  Location: [facility];  Service: Radiology;  Laterality: N/A;    Prior to Admission medications   Medication Sig Start Date End Date Taking? Authorizing Provider  amLODipine  (NORVASC) 10 MG tablet Take 1 tablet (10 mg total) by mouth at bedtime. 07/08/19  Yes Debbe Odea, MD  atorvastatin (LIPITOR) 40 MG tablet Take 1 tablet (40 mg total) by mouth daily at 6 PM. 06/29/19  Yes Adhikari, Tamsen Meek, MD  carvedilol (COREG) 25 MG tablet Take 1 tablet (25 mg total) by mouth 2 (two) times daily with a meal. 06/29/19  Yes Adhikari, Tamsen Meek, MD  chlorproMAZINE (THORAZINE) 10 MG tablet Take 1 tablet (10 mg total) by mouth 3 (three) times daily as needed for hiccoughs. 08/10/19  Yes Shelly Coss, MD  folic acid-vitamin b complex-vitamin c-selenium-zinc (DIALYVITE) 3 MG TABS tablet Take 1 tablet by mouth daily.   Yes [provider]  hydrALAZINE (APRESOLINE) 100 MG tablet Take 1 tablet (100 mg total) by mouth every 8 (eight) hours. 07/08/19  Yes Debbe Odea, MD  minoxidil (LONITEN) 2.5 MG tablet Take 2 tablets (5 mg total) by mouth 2 (two) times daily. 08/20/19  Yes Debbe Odea, MD  nicotine (NICODERM CQ - DOSED IN MG/24 HR) 7 mg/24hr patch Place 7 mg onto the skin daily.   Yes [provider]  oxyCODONE-acetaminophen (PERCOCET/ROXICET) 5-325 MG tablet Take 1 tablet by mouth every 4 (four) hours as needed for moderate pain. 08/20/19  Yes Debbe Odea, MD  sucroferric oxyhydroxide (VELPHORO) 500 MG chewable tablet Chew 500 mg by mouth 3 (three) times daily with meals. 08/21/19  Yes [provider]  aspirin 81 MG chewable tablet Chew 1 tablet (81 mg total) by mouth daily. Patient taking differently: Chew 81 mg by mouth as needed for mild pain.  06/30/19   Shelly Coss, MD  clopidogrel (PLAVIX) 75 MG  tablet Take 75 mg by mouth daily. 08/11/19   [provider]  spironolactone (ALDACTONE) 25 MG tablet Take 25 mg by mouth daily.    [provider]    Current Facility-Administered Medications  Medication Dose Route Frequency Provider Last Rate Last Dose  . pantoprazole (PROTONIX) 80 mg in sodium chloride 0.9 % 100 mL IVPB  80 mg Intravenous Once  Isla Pence, MD      . pantoprazole (PROTONIX) 80 mg in sodium chloride 0.9 % 250 mL (0.32 mg/mL) infusion  8 mg/hr Intravenous Continuous Isla Pence, MD       Current Outpatient Medications  Medication Sig Dispense Refill  . amLODipine (NORVASC) 10 MG tablet Take 1 tablet (10 mg total) by mouth at bedtime. 30 tablet 0  . atorvastatin (LIPITOR) 40 MG tablet Take 1 tablet (40 mg total) by mouth daily at 6 PM.    . carvedilol (COREG) 25 MG tablet Take 1 tablet (25 mg total) by mouth 2 (two) times daily with a meal.    . chlorproMAZINE (THORAZINE) 10 MG tablet Take 1 tablet (10 mg total) by mouth 3 (three) times daily as needed for hiccoughs. 20 tablet 0  . folic acid-vitamin b complex-vitamin c-selenium-zinc (DIALYVITE) 3 MG TABS tablet Take 1 tablet by mouth daily.    . hydrALAZINE (APRESOLINE) 100 MG tablet Take 1 tablet (100 mg total) by mouth every 8 (eight) hours. 30 tablet 3  . minoxidil (LONITEN) 2.5 MG tablet Take 2 tablets (5 mg total) by mouth 2 (two) times daily. 60 tablet 0  . nicotine (NICODERM CQ - DOSED IN MG/24 HR) 7 mg/24hr patch Place 7 mg onto the skin daily.    Marland Kitchen oxyCODONE-acetaminophen (PERCOCET/ROXICET) 5-325 MG tablet Take 1 tablet by mouth every 4 (four) hours as needed for moderate pain. 10 tablet 0  . sucroferric oxyhydroxide (VELPHORO) 500 MG chewable tablet Chew 500 mg by mouth 3 (three) times daily with meals.    Marland Kitchen aspirin 81 MG chewable tablet Chew 1 tablet (81 mg total) by mouth daily. (Patient taking differently: Chew 81 mg by mouth as needed for mild pain. )    . clopidogrel (PLAVIX) 75 MG tablet Take 75 mg by mouth daily.    Marland Kitchen spironolactone (ALDACTONE) 25 MG tablet Take 25 mg by mouth daily.      Allergies as of 08/26/2019 - Review Complete 08/26/2019  Allergen Reaction Noted  . Reglan [metoclopramide] Nausea And Vomiting 06/22/2019    Family History  Problem Relation Age of Onset  . Diabetes Sister   . Diabetes Maternal Uncle   . Hypertension  Father   . Hypertension Paternal Grandfather   . Prostate cancer Paternal Grandfather   . Diabetes Sister   . Renal Disease Neg Hx     Social History   Socioeconomic History  . Marital status: Married    Spouse name: [name]  . Number of children: 3  . Years of education: Not on file  . Highest education level: High school graduate  Occupational History  . Occupation: Secretary/administrator - laid off  Social Needs  . Financial resource strain: Not hard at all  . Food insecurity    Worry: Never true    Inability: Never true  . Transportation needs    Medical: No    Non-medical: No  Tobacco Use  . Smoking status: Former Smoker    Packs/day: 0.00    Years: 20.00    Pack years: 0.00    Quit date: 06/2019  Years since quitting: 0.2  . Smokeless tobacco: Never Used  Substance and Sexual Activity  . Alcohol use: Not Currently    Comment: 2-3 beers a day; stopped with 7/20 hospitalization  . Drug use: Yes    Types: Marijuana, Cocaine, Benzodiazepines    Comment: denies use since 7/20 hospitalization  . Sexual activity: Not Currently    Partners: Female  Lifestyle  . Physical activity    Days per week: 0 days    Minutes per session: 0 min  . Stress: Not at all  Relationships  . Social connections    Talks on phone: More than three times a week    Gets together: Never    Attends religious service: 1 to 4 times per year    Active member of club or organization: No    Attends meetings of clubs or organizations: Never    Relationship status: Married  . Intimate partner violence    Fear of current or ex partner: No    Emotionally abused: No    Physically abused: No    Forced sexual activity: No  Other Topics Concern  . Not on file  Social History Narrative  . Not on file    Review of Systems: Positive for: GI: Described in detail in HPI.    [id number], weakness, malaise, Denies any fever, chills, rigors, night sweats, anorexia,  involuntary weight loss, and sleep  disorder CV: Denies chest pain, angina, palpitations, syncope, orthopnea, PND, peripheral edema, and claudication. Resp: Denies dyspnea, cough, sputum, wheezing, coughing up blood. GU : Denies urinary burning, blood in urine, urinary frequency, urinary hesitancy, nocturnal urination, and urinary incontinence. MS: Recent right forearm graft infection, denies joint pain or swelling.  Denies muscle weakness, cramps, atrophy.  Derm: Denies rash, itching, oral ulcerations, hives, unhealing ulcers.  Psych: Denies depression, anxiety, memory loss, suicidal ideation, hallucinations,  and confusion. Heme: Denies bruising, bleeding, and enlarged lymph nodes. Neuro:  Denies any headaches, dizziness, paresthesias. Endo:  Denies any problems with DM, thyroid, adrenal function.  Physical Exam: Vital signs in last 24 hours: Temp:  [97.6 F (36.4 C)-98.7 F (37.1 C)] 97.6 F (36.4 C) (09/12 1406) Pulse Rate:  [77-89] 86 (09/12 1406) Resp:  [16-25] 16 (09/12 1406) BP: (98-138)/(51-88) 138/88 (09/12 1406) SpO2:  [98 %-100 %] 98 % (09/12 1349)    General:   Alert,  Well-developed, well-nourished, pleasant and cooperative in NAD Head:  Normocephalic and atraumatic. Eyes:  Sclera clear, no icterus.   Prominent pallor  ears:  Normal auditory acuity. Nose:  No deformity, discharge,  or lesions. Mouth:  No deformity or lesions.  Oropharynx pink & moist. Neck:  Supple; no masses or thyromegaly. Lungs:  Clear throughout to auscultation.   No wheezes, crackles, or rhonchi. No acute distress. Heart:  Regular rate and rhythm; no murmurs, clicks, rubs,  or gallops. Extremities: Healing infection of right forearm  neurologic:  Alert and  oriented x4;  grossly normal neurologically. Skin:  Intact without significant lesions or rashes. Psych:  Alert and cooperative. Normal mood and affect. Abdomen:  Soft, nontender and nondistended. No masses, hepatosplenomegaly or hernias noted. Normal bowel sounds, without  guarding, and without rebound.         Lab Results: Recent Labs    08/26/19 1123  WBC 5.7  HGB 6.3*  HCT 20.0*  PLT 358   BMET Recent Labs    08/26/19 1123  NA 138  K 3.1*  CL 94*  CO2 30  GLUCOSE 141*  BUN 9  CREATININE 7.04*  CALCIUM 9.4   LFT Recent Labs    08/26/19 1123  PROT 6.3*  ALBUMIN 3.1*  AST 19  ALT 8  ALKPHOS 60  BILITOT 0.6   PT/INR No results for input(s): LABPROT, INR in the last 72 hours.  Studies/Results: No results found.  Impression: Severe symptomatic anemia, hemoglobin has been around 6.2-7.1, normocytic in presence of end-stage renal disease on hemodialysis. Possible intermittent black stools FOBT positive CT from 06/25/2019 without contrast showed a normal stomach, no bowel wall thickening or inflammatory change   Plan: Okay to start on a renal diet, n.p.o. post midnight Patient started on IV Protonix drip Plan diagnostic EGD in a.m.. The risks and the benefits of the procedure were discussed with the patient in details, he verbalized understanding and consents.   LOS: 0 days   Ronnette Juniper, MD  08/26/2019, 3:11 PM  Pager [phone] If no answer or after 5 PM call [phone]

## 2019-08-26 NOTE — ED Notes (Addendum)
ED TO INPATIENT HANDOFF REPORT  ED Nurse Name and Phone #: Thurmond Butts [place] Name/Age/Gender William [name] 33 y.o. male Room/Bed: 028C/028C  Code Status   Code Status: Prior  Home/SNF/Other Home Patient oriented to: self, place, time and situation Is this baseline? Yes   Triage Complete: Triage complete  Chief Complaint sick  Triage Note Pt. Stated, Im suppose to get blood , I was sent by my Dr, my hgb is low.   Allergies Allergies  Allergen Reactions  . Reglan [Metoclopramide] Nausea And Vomiting    Develops akathisia    Level of Care/Admitting Diagnosis ED Disposition    ED Disposition Condition [facility] Area: [place] [[id number]]  Level of Care: Med-Surg [16]  Covid Evaluation: Asymptomatic Screening Protocol (No Symptoms)  Diagnosis: Anemia [id number]  Admitting Physician: Laren Everts, [place]  Attending Physician: HIJAZI, ALI Marshal.[place]  Estimated length of stay: past midnight tomorrow  Certification:: I certify this patient will need inpatient services for at least 2 midnights  PT Class (Do Not Modify): Inpatient [101]  PT Acc Code (Do Not Modify): Private [1]       B Medical/Surgery History Past Medical History:  Diagnosis Date  . Anemia   . Drug abuse, cocaine type ([place]) 06/23/2019  . ESRD (end stage renal disease) ([place]) 08/08/2019  . Hyperlipidemia   . Hypertension   . Stroke [facility]) 06/2019   Past Surgical History:  Procedure Laterality Date  . AV FISTULA PLACEMENT Right 08/15/2019   Procedure: INSERTION OF ARTERIOVENOUS (AV) GORE-TEX GRAFT ARM. Using 4-11mm STRETCH Goretex.;  Surgeon: Angelia Mould, MD;  Location: [facility] OR;  Service: Vascular;  Laterality: Right;  . INSERTION OF DIALYSIS CATHETER Right 08/08/2019   Procedure: INSERTION OF right internal jugular DIALYSIS CATHETER;  Surgeon: Angelia Mould, MD;  Location: [place];  Service: Vascular;  Laterality: Right;  . RADIOLOGY WITH ANESTHESIA N/A  06/23/2019   Procedure: MRI OF VESSELS;  Surgeon: Radiologist, Medication, MD;  Location: [place];  Service: Radiology;  Laterality: N/A;     A IV Location/Drains/Wounds Patient Lines/Drains/Airways Status   Active Line/Drains/Airways    Name:   Placement date:   Placement time:   Site:   Days:   Peripheral IV 08/26/19 Left Antecubital   08/26/19    1212    Antecubital   less than 1   Hemodialysis Catheter Right Internal jugular Double lumen Permanent (Tunneled)   08/08/19    1722    Internal jugular   18   Incision (Closed) 08/08/19 Neck Right   08/08/19    1734     18   Incision (Closed) 08/15/19 Arm Right   08/15/19    1310     11          Intake/Output Last 24 hours No intake or output data in the 24 hours ending 08/26/19 1429  Labs/Imaging Results for orders placed or performed during the hospital encounter of 08/26/19 (from the past 48 hour(s))  CBC with Differential     Status: Abnormal   Collection Time: 08/26/19 11:23 AM  Result Value Ref Range   WBC 5.7 4.0 - 10.5 K/uL   RBC 2.06 (L) 4.22 - 5.81 MIL/uL   Hemoglobin 6.3 (LL) 13.0 - 17.0 g/dL    Comment: REPEATED TO VERIFY THIS CRITICAL RESULT HAS VERIFIED AND BEEN CALLED TO Brynlei Klausner, RN BY KAY WOOLLEN ON 09 12 2020 AT 1216, AND HAS BEEN READ BACK.  HCT 20.0 (L) 39.0 - 52.0 %   MCV 97.1 80.0 - 100.0 fL   MCH 30.6 26.0 - 34.0 pg   MCHC 31.5 30.0 - 36.0 g/dL   RDW 13.6 11.5 - 15.5 %   Platelets 358 150 - 400 K/uL   nRBC 0.0 0.0 - 0.2 %   Neutrophils Relative % 64 %   Neutro Abs 3.6 1.7 - 7.7 K/uL   Lymphocytes Relative 21 %   Lymphs Abs 1.2 0.7 - 4.0 K/uL   Monocytes Relative 11 %   Monocytes Absolute 0.6 0.1 - 1.0 K/uL   Eosinophils Relative 3 %   Eosinophils Absolute 0.2 0.0 - 0.5 K/uL   Basophils Relative 1 %   Basophils Absolute 0.0 0.0 - 0.1 K/uL   Immature Granulocytes 0 %   Abs Immature Granulocytes 0.02 0.00 - 0.07 K/uL    Comment: Performed at [facility] [address], [place], [place]  [address]  Comprehensive metabolic panel     Status: Abnormal   Collection Time: 08/26/19 11:23 AM  Result Value Ref Range   Sodium 138 135 - 145 mmol/L   Potassium 3.1 (L) 3.5 - 5.1 mmol/L   Chloride 94 (L) 98 - 111 mmol/L   CO2 30 22 - 32 mmol/L   Glucose, Bld 141 (H) 70 - 99 mg/dL   BUN 9 6 - 20 mg/dL   Creatinine, Ser 7.04 (H) 0.61 - 1.24 mg/dL   Calcium 9.4 8.9 - 10.3 mg/dL   Total Protein 6.3 (L) 6.5 - 8.1 g/dL   Albumin 3.1 (L) 3.5 - 5.0 g/dL   AST 19 15 - 41 U/L   ALT 8 0 - 44 U/L   Alkaline Phosphatase 60 38 - 126 U/L   Total Bilirubin 0.6 0.3 - 1.2 mg/dL   GFR calc non Af Amer 9 (L) >60 mL/min   GFR calc Af Amer 11 (L) >60 mL/min   Anion gap 14 5 - 15    Comment: Performed at [facility], [place] [address] [place], [place] [address]  Type and screen [place]     Status: None (Preliminary result)   Collection Time: 08/26/19 12:04 PM  Result Value Ref Range   ABO/RH(D) O POS    Antibody Screen NEG    Sample Expiration 08/29/2019,2359    Unit Number [id number]    Blood Component Type RBC LR PHER1    Unit division 00    Status of Unit ISSUED    Transfusion Status OK TO TRANSFUSE    Crossmatch Result      Compatible Performed at [facility], [place] [address], [place], [place] [address]   Prepare RBC     Status: None   Collection Time: 08/26/19  1:02 PM  Result Value Ref Range   Order Confirmation      ORDER PROCESSED BY BLOOD BANK Performed at [facility], [place] [address] [place], [place] [address]   POC occult blood, ED Provider will collect     Status: Abnormal   Collection Time: 08/26/19  1:18 PM  Result Value Ref Range   Fecal Occult Bld POSITIVE (A) NEGATIVE   No results found.  Pending Labs Unresulted Labs (From admission, onward)    Start     Ordered   08/26/19 1330  SARS Coronavirus 2 [facility] order, Performed in [facility] hospital lab) Nasopharyngeal Nasopharyngeal Swab  (Symptomatic/High Risk of Exposure/Tier  1 Patients Labs with Precautions)  Once,   STAT    Question Answer Comment  Is this test for diagnosis or screening Screening   Symptomatic for COVID-19 as defined by CDC No   Hospitalized for COVID-19 No   Admitted to ICU for COVID-19 No   Previously tested for COVID-19 No   Resident in a congregate (group) care setting No   Employed in healthcare setting No      08/26/19 1330   08/26/19 1329  Protime-INR  ONCE - STAT,   STAT     08/26/19 1328   Signed and Held  Basic metabolic panel  Tomorrow morning,   R     Signed and Held   Signed and Held  CBC  Tomorrow morning,   R     Signed and Held          Vitals/Pain Today's Vitals   08/26/19 1119 08/26/19 1145 08/26/19 1349 08/26/19 1406  BP:  106/67 125/85 138/88  Pulse:  81 89 86  Resp: 17 (!) 25 16 16   Temp:   98.7 F (37.1 C) 97.6 F (36.4 C)  TempSrc:   Oral Oral  SpO2:  99% 98%   PainSc:        Isolation Precautions No active isolations  Medications Medications  pantoprazole (PROTONIX) 80 mg in sodium chloride 0.9 % 100 mL IVPB (has no administration in time range)  pantoprazole (PROTONIX) 80 mg in sodium chloride 0.9 % 250 mL (0.32 mg/mL) infusion (has no administration in time range)  0.9 %  sodium chloride infusion (Manually program via Guardrails IV Fluids) ( Intravenous New Bag/Given 08/26/19 1354)    Mobility walks Low fall risk   Focused Assessments    R Recommendations: See Admitting Provider Note  Report given to: Laurina Bustle RN  Additional Notes:

## 2019-08-26 NOTE — ED Triage Notes (Signed)
Pt. Stated, Im suppose to get blood , I was sent by my Dr, my hgb is low.

## 2019-08-26 NOTE — H&P (View-Only) (Signed)
[facility] Consult  Referring Provider: Isla Pence, MD/ER Primary Care Physician:  Patient, No Pcp Per Primary Gastroenterologist: Althia Forts  Reason for Consultation: Anemia, black stools  HPI: William [name] is a 33 y.o. male is an end-stage renal dialysis patient recently started on hemodialysis(08/08/2019), history of infected right AV graft, currently on dialysis Monday Wednesday Friday was sent to the ER due to profound anemia noted as an outpatient.  Patient states he has been having dark stools intermittently although he denies frank bleeding, abdominal pain or vomiting of blood.  He denies use of NSAIDs and recent alcohol use.  No prior EGD. He has history of drug abuse/cocaine and history of stroke without obvious neurologic deficits. Patient denies nausea, vomiting, acid reflux, heartburn, difficulty swallowing or pain on swallowing.  He denies unintentional weight loss or loss of appetite.  Past Medical History:  Diagnosis Date  . Anemia   . Drug abuse, cocaine type ([place]) 06/23/2019  . ESRD (end stage renal disease) ([place]) 08/08/2019  . Hyperlipidemia   . Hypertension   . Stroke [facility]) 06/2019    Past Surgical History:  Procedure Laterality Date  . AV FISTULA PLACEMENT Right 08/15/2019   Procedure: INSERTION OF ARTERIOVENOUS (AV) GORE-TEX GRAFT ARM. Using 4-60mm STRETCH Goretex.;  Surgeon: Angelia Mould, MD;  Location: [facility] OR;  Service: Vascular;  Laterality: Right;  . INSERTION OF DIALYSIS CATHETER Right 08/08/2019   Procedure: INSERTION OF right internal jugular DIALYSIS CATHETER;  Surgeon: Angelia Mould, MD;  Location: [place];  Service: Vascular;  Laterality: Right;  . RADIOLOGY WITH ANESTHESIA N/A 06/23/2019   Procedure: MRI OF VESSELS;  Surgeon: Radiologist, Medication, MD;  Location: [place];  Service: Radiology;  Laterality: N/A;    Prior to Admission medications   Medication Sig Start Date End Date Taking? Authorizing Provider  amLODipine  (NORVASC) 10 MG tablet Take 1 tablet (10 mg total) by mouth at bedtime. 07/08/19  Yes Debbe Odea, MD  atorvastatin (LIPITOR) 40 MG tablet Take 1 tablet (40 mg total) by mouth daily at 6 PM. 06/29/19  Yes Adhikari, Tamsen Meek, MD  carvedilol (COREG) 25 MG tablet Take 1 tablet (25 mg total) by mouth 2 (two) times daily with a meal. 06/29/19  Yes Adhikari, Tamsen Meek, MD  chlorproMAZINE (THORAZINE) 10 MG tablet Take 1 tablet (10 mg total) by mouth 3 (three) times daily as needed for hiccoughs. 08/10/19  Yes Shelly Coss, MD  folic acid-vitamin b complex-vitamin c-selenium-zinc (DIALYVITE) 3 MG TABS tablet Take 1 tablet by mouth daily.   Yes [provider]  hydrALAZINE (APRESOLINE) 100 MG tablet Take 1 tablet (100 mg total) by mouth every 8 (eight) hours. 07/08/19  Yes Debbe Odea, MD  minoxidil (LONITEN) 2.5 MG tablet Take 2 tablets (5 mg total) by mouth 2 (two) times daily. 08/20/19  Yes Debbe Odea, MD  nicotine (NICODERM CQ - DOSED IN MG/24 HR) 7 mg/24hr patch Place 7 mg onto the skin daily.   Yes [provider]  oxyCODONE-acetaminophen (PERCOCET/ROXICET) 5-325 MG tablet Take 1 tablet by mouth every 4 (four) hours as needed for moderate pain. 08/20/19  Yes Debbe Odea, MD  sucroferric oxyhydroxide (VELPHORO) 500 MG chewable tablet Chew 500 mg by mouth 3 (three) times daily with meals. 08/21/19  Yes [provider]  aspirin 81 MG chewable tablet Chew 1 tablet (81 mg total) by mouth daily. Patient taking differently: Chew 81 mg by mouth as needed for mild pain.  06/30/19   Shelly Coss, MD  clopidogrel (PLAVIX) 75 MG  tablet Take 75 mg by mouth daily. 08/11/19   [provider]  spironolactone (ALDACTONE) 25 MG tablet Take 25 mg by mouth daily.    [provider]    Current Facility-Administered Medications  Medication Dose Route Frequency Provider Last Rate Last Dose  . pantoprazole (PROTONIX) 80 mg in sodium chloride 0.9 % 100 mL IVPB  80 mg Intravenous Once  Isla Pence, MD      . pantoprazole (PROTONIX) 80 mg in sodium chloride 0.9 % 250 mL (0.32 mg/mL) infusion  8 mg/hr Intravenous Continuous Isla Pence, MD       Current Outpatient Medications  Medication Sig Dispense Refill  . amLODipine (NORVASC) 10 MG tablet Take 1 tablet (10 mg total) by mouth at bedtime. 30 tablet 0  . atorvastatin (LIPITOR) 40 MG tablet Take 1 tablet (40 mg total) by mouth daily at 6 PM.    . carvedilol (COREG) 25 MG tablet Take 1 tablet (25 mg total) by mouth 2 (two) times daily with a meal.    . chlorproMAZINE (THORAZINE) 10 MG tablet Take 1 tablet (10 mg total) by mouth 3 (three) times daily as needed for hiccoughs. 20 tablet 0  . folic acid-vitamin b complex-vitamin c-selenium-zinc (DIALYVITE) 3 MG TABS tablet Take 1 tablet by mouth daily.    . hydrALAZINE (APRESOLINE) 100 MG tablet Take 1 tablet (100 mg total) by mouth every 8 (eight) hours. 30 tablet 3  . minoxidil (LONITEN) 2.5 MG tablet Take 2 tablets (5 mg total) by mouth 2 (two) times daily. 60 tablet 0  . nicotine (NICODERM CQ - DOSED IN MG/24 HR) 7 mg/24hr patch Place 7 mg onto the skin daily.    Marland Kitchen oxyCODONE-acetaminophen (PERCOCET/ROXICET) 5-325 MG tablet Take 1 tablet by mouth every 4 (four) hours as needed for moderate pain. 10 tablet 0  . sucroferric oxyhydroxide (VELPHORO) 500 MG chewable tablet Chew 500 mg by mouth 3 (three) times daily with meals.    Marland Kitchen aspirin 81 MG chewable tablet Chew 1 tablet (81 mg total) by mouth daily. (Patient taking differently: Chew 81 mg by mouth as needed for mild pain. )    . clopidogrel (PLAVIX) 75 MG tablet Take 75 mg by mouth daily.    Marland Kitchen spironolactone (ALDACTONE) 25 MG tablet Take 25 mg by mouth daily.      Allergies as of 08/26/2019 - Review Complete 08/26/2019  Allergen Reaction Noted  . Reglan [metoclopramide] Nausea And Vomiting 06/22/2019    Family History  Problem Relation Age of Onset  . Diabetes Sister   . Diabetes Maternal Uncle   . Hypertension  Father   . Hypertension Paternal Grandfather   . Prostate cancer Paternal Grandfather   . Diabetes Sister   . Renal Disease Neg Hx     Social History   Socioeconomic History  . Marital status: Married    Spouse name: [name]  . Number of children: 3  . Years of education: Not on file  . Highest education level: High school graduate  Occupational History  . Occupation: Secretary/administrator - laid off  Social Needs  . Financial resource strain: Not hard at all  . Food insecurity    Worry: Never true    Inability: Never true  . Transportation needs    Medical: No    Non-medical: No  Tobacco Use  . Smoking status: Former Smoker    Packs/day: 0.00    Years: 20.00    Pack years: 0.00    Quit date: 06/2019  Years since quitting: 0.2  . Smokeless tobacco: Never Used  Substance and Sexual Activity  . Alcohol use: Not Currently    Comment: 2-3 beers a day; stopped with 7/20 hospitalization  . Drug use: Yes    Types: Marijuana, Cocaine, Benzodiazepines    Comment: denies use since 7/20 hospitalization  . Sexual activity: Not Currently    Partners: Female  Lifestyle  . Physical activity    Days per week: 0 days    Minutes per session: 0 min  . Stress: Not at all  Relationships  . Social connections    Talks on phone: More than three times a week    Gets together: Never    Attends religious service: 1 to 4 times per year    Active member of club or organization: No    Attends meetings of clubs or organizations: Never    Relationship status: Married  . Intimate partner violence    Fear of current or ex partner: No    Emotionally abused: No    Physically abused: No    Forced sexual activity: No  Other Topics Concern  . Not on file  Social History Narrative  . Not on file    Review of Systems: Positive for: GI: Described in detail in HPI.    [id number], weakness, malaise, Denies any fever, chills, rigors, night sweats, anorexia,  involuntary weight loss, and sleep  disorder CV: Denies chest pain, angina, palpitations, syncope, orthopnea, PND, peripheral edema, and claudication. Resp: Denies dyspnea, cough, sputum, wheezing, coughing up blood. GU : Denies urinary burning, blood in urine, urinary frequency, urinary hesitancy, nocturnal urination, and urinary incontinence. MS: Recent right forearm graft infection, denies joint pain or swelling.  Denies muscle weakness, cramps, atrophy.  Derm: Denies rash, itching, oral ulcerations, hives, unhealing ulcers.  Psych: Denies depression, anxiety, memory loss, suicidal ideation, hallucinations,  and confusion. Heme: Denies bruising, bleeding, and enlarged lymph nodes. Neuro:  Denies any headaches, dizziness, paresthesias. Endo:  Denies any problems with DM, thyroid, adrenal function.  Physical Exam: Vital signs in last 24 hours: Temp:  [97.6 F (36.4 C)-98.7 F (37.1 C)] 97.6 F (36.4 C) (09/12 1406) Pulse Rate:  [77-89] 86 (09/12 1406) Resp:  [16-25] 16 (09/12 1406) BP: (98-138)/(51-88) 138/88 (09/12 1406) SpO2:  [98 %-100 %] 98 % (09/12 1349)    General:   Alert,  Well-developed, well-nourished, pleasant and cooperative in NAD Head:  Normocephalic and atraumatic. Eyes:  Sclera clear, no icterus.   Prominent pallor  ears:  Normal auditory acuity. Nose:  No deformity, discharge,  or lesions. Mouth:  No deformity or lesions.  Oropharynx pink & moist. Neck:  Supple; no masses or thyromegaly. Lungs:  Clear throughout to auscultation.   No wheezes, crackles, or rhonchi. No acute distress. Heart:  Regular rate and rhythm; no murmurs, clicks, rubs,  or gallops. Extremities: Healing infection of right forearm  neurologic:  Alert and  oriented x4;  grossly normal neurologically. Skin:  Intact without significant lesions or rashes. Psych:  Alert and cooperative. Normal mood and affect. Abdomen:  Soft, nontender and nondistended. No masses, hepatosplenomegaly or hernias noted. Normal bowel sounds, without  guarding, and without rebound.         Lab Results: Recent Labs    08/26/19 1123  WBC 5.7  HGB 6.3*  HCT 20.0*  PLT 358   BMET Recent Labs    08/26/19 1123  NA 138  K 3.1*  CL 94*  CO2 30  GLUCOSE 141*  BUN 9  CREATININE 7.04*  CALCIUM 9.4   LFT Recent Labs    08/26/19 1123  PROT 6.3*  ALBUMIN 3.1*  AST 19  ALT 8  ALKPHOS 60  BILITOT 0.6   PT/INR No results for input(s): LABPROT, INR in the last 72 hours.  Studies/Results: No results found.  Impression: Severe symptomatic anemia, hemoglobin has been around 6.2-7.1, normocytic in presence of end-stage renal disease on hemodialysis. Possible intermittent black stools FOBT positive CT from 06/25/2019 without contrast showed a normal stomach, no bowel wall thickening or inflammatory change   Plan: Okay to start on a renal diet, n.p.o. post midnight Patient started on IV Protonix drip Plan diagnostic EGD in a.m.. The risks and the benefits of the procedure were discussed with the patient in details, he verbalized understanding and consents.   LOS: 0 days   Ronnette Juniper, MD  08/26/2019, 3:11 PM  Pager [phone] If no answer or after 5 PM call [phone]

## 2019-08-26 NOTE — H&P (Signed)
[facility]                                                                                    Patient Demographics  William [name], is a 33 y.o. male  CSN: [id number]  MRN: [id number]  DOB - 22-Sep-1986  Admit Date - 08/26/2019  Outpatient Primary MD for the patient is Patient, No Pcp Per   With History of -  Past Medical History:  Diagnosis Date  . Anemia   . Drug abuse, cocaine type ([place]) 06/23/2019  . ESRD (end stage renal disease) ([place]) 08/08/2019  . Hyperlipidemia   . Hypertension   . Stroke [facility]) 06/2019      Past Surgical History:  Procedure Laterality Date  . AV FISTULA PLACEMENT Right 08/15/2019   Procedure: INSERTION OF ARTERIOVENOUS (AV) GORE-TEX GRAFT ARM. Using 4-57mm STRETCH Goretex.;  Surgeon: Angelia Mould, MD;  Location: [facility] OR;  Service: Vascular;  Laterality: Right;  . INSERTION OF DIALYSIS CATHETER Right 08/08/2019   Procedure: INSERTION OF right internal jugular DIALYSIS CATHETER;  Surgeon: Angelia Mould, MD;  Location: [place];  Service: Vascular;  Laterality: Right;  . RADIOLOGY WITH ANESTHESIA N/A 06/23/2019   Procedure: MRI OF VESSELS;  Surgeon: Radiologist, Medication, MD;  Location: [place];  Service: Radiology;  Laterality: N/A;    in for   Chief Complaint  Patient presents with  . Abnormal Lab     HPI  William [name]  is a 33 y.o. male, with past medical history significant for end-stage renal disease on hemodialysis status post recent admission and discharge on 9/6 for infected AV graft, history of CVA, hyperlipidemia, symptomatic anemia and hypertension presenting for evaluation of anemia .  Patient was evaluated at the dialysis center today where they found that his hemoglobin was 6.3 . Work-up in the emergency room showed guaiac positive stools, hemoglobin of 6.3, potassium 3.1. GI was consulted . Patient denies any history of alcoholism, nonsteroidal use lately.  He denies abdominal pain, nausea vomiting or  diarrhea.  He denies frank blood per rectum. Patient has history of drug abuse    Review of Systems    In addition to the HPI above,  No Fever-chills, No Headache, No changes with Vision or hearing, No problems swallowing food or Liquids, No Chest pain, Cough or Shortness of Breath, No Blood in stool or Urine, No dysuria, No new skin rashes or bruises, No new joints pains-aches,  No new weakness, tingling, numbness in any extremity, No recent weight gain or loss, No polyuria, polydypsia or polyphagia, No significant Mental Stressors.     Social History Social History   Tobacco Use  . Smoking status: Former Smoker    Packs/day: 0.00    Years: 20.00    Pack years: 0.00    Quit date: 06/2019    Years since quitting: 0.2  . Smokeless tobacco: Never Used  Substance Use Topics  . Alcohol use: Not Currently    Comment: 2-3 beers a day; stopped with 7/20 hospitalization     Family History Family History  Problem Relation Age of Onset  . Diabetes Sister   . Diabetes Maternal Uncle   .  Hypertension Father   . Hypertension Paternal Grandfather   . Prostate cancer Paternal Grandfather   . Diabetes Sister   . Renal Disease Neg Hx      Prior to Admission medications   Medication Sig Start Date End Date Taking? Authorizing Provider  amLODipine (NORVASC) 10 MG tablet Take 1 tablet (10 mg total) by mouth at bedtime. 07/08/19  Yes Debbe Odea, MD  atorvastatin (LIPITOR) 40 MG tablet Take 1 tablet (40 mg total) by mouth daily at 6 PM. 06/29/19  Yes Adhikari, Tamsen Meek, MD  carvedilol (COREG) 25 MG tablet Take 1 tablet (25 mg total) by mouth 2 (two) times daily with a meal. 06/29/19  Yes Adhikari, Tamsen Meek, MD  chlorproMAZINE (THORAZINE) 10 MG tablet Take 1 tablet (10 mg total) by mouth 3 (three) times daily as needed for hiccoughs. 08/10/19  Yes Shelly Coss, MD  folic acid-vitamin b complex-vitamin c-selenium-zinc (DIALYVITE) 3 MG TABS tablet Take 1 tablet by mouth daily.   Yes  [provider]  hydrALAZINE (APRESOLINE) 100 MG tablet Take 1 tablet (100 mg total) by mouth every 8 (eight) hours. 07/08/19  Yes Debbe Odea, MD  minoxidil (LONITEN) 2.5 MG tablet Take 2 tablets (5 mg total) by mouth 2 (two) times daily. 08/20/19  Yes Debbe Odea, MD  nicotine (NICODERM CQ - DOSED IN MG/24 HR) 7 mg/24hr patch Place 7 mg onto the skin daily.   Yes [provider]  oxyCODONE-acetaminophen (PERCOCET/ROXICET) 5-325 MG tablet Take 1 tablet by mouth every 4 (four) hours as needed for moderate pain. 08/20/19  Yes Debbe Odea, MD  sucroferric oxyhydroxide (VELPHORO) 500 MG chewable tablet Chew 500 mg by mouth 3 (three) times daily with meals. 08/21/19  Yes [provider]  aspirin 81 MG chewable tablet Chew 1 tablet (81 mg total) by mouth daily. Patient taking differently: Chew 81 mg by mouth as needed for mild pain.  06/30/19   Shelly Coss, MD  clopidogrel (PLAVIX) 75 MG tablet Take 75 mg by mouth daily. 08/11/19   [provider]  spironolactone (ALDACTONE) 25 MG tablet Take 25 mg by mouth daily.    [provider]    Allergies  Allergen Reactions  . Reglan [Metoclopramide] Nausea And Vomiting    Develops akathisia    Physical Exam  Vitals  Blood pressure (!) 168/95, pulse 86, temperature 98 F (36.7 C), resp. rate 17, height 5\' 9"  (1.753 m), weight 81.6 kg, SpO2 100 %.   General appearance, no acute distress, well-developed, well-nourished extremely pleasant HEENT no jaundice or pallor, no facial deviation, no oral thrush Neck supple Chest clear and resonant Heart normal S1-S2 no murmur Abdomen soft, nontender, bowel sounds present Extremities no clubbing cyanosis or edema Neuro gross nonfocal, patient moving all extremities Skin no rashes or ulcers    Data Review  CBC Recent Labs  Lab 08/19/19 1807 08/20/19 0440 08/26/19 1123  WBC 11.0* 11.7* 5.7  HGB 6.7* 7.1* 6.3*  HCT 21.8* 22.8* 20.0*  PLT 283 368 358   MCV 99.5 99.1 97.1  MCH 30.6 30.9 30.6  MCHC 30.7 31.1 31.5  RDW 13.3 13.2 13.6  LYMPHSABS  --   --  1.2  MONOABS  --   --  0.6  EOSABS  --   --  0.2  BASOSABS  --   --  0.0   ------------------------------------------------------------------------------------------------------------------  Chemistries  Recent Labs  Lab 08/20/19 0440 08/26/19 1123  NA 137 138  K 3.5 3.1*  CL 93* 94*  CO2 28 30  GLUCOSE 110* 141*  BUN 27* 9  CREATININE 10.18* 7.04*  CALCIUM 9.6 9.4  AST  --  19  ALT  --  8  ALKPHOS  --  60  BILITOT  --  0.6   ------------------------------------------------------------------------------------------------------------------ estimated creatinine clearance is 14.8 mL/min (A) (by C-G formula based on SCr of 7.04 mg/dL (H)). ------------------------------------------------------------------------------------------------------------------ No results for input(s): TSH, T4TOTAL, T3FREE, THYROIDAB in the last 72 hours.  Invalid input(s): FREET3   Coagulation profile Recent Labs  Lab 08/26/19 1542  INR 1.2   ------------------------------------------------------------------------------------------------------------------- No results for input(s): DDIMER in the last 72 hours. -------------------------------------------------------------------------------------------------------------------  Cardiac Enzymes No results for input(s): CKMB, TROPONINI, MYOGLOBIN in the last 168 hours.  Invalid input(s): CK ------------------------------------------------------------------------------------------------------------------ Invalid input(s): POCBNP   ---------------------------------------------------------------------------------------------------------------  Urinalysis    Component Value Date/Time   COLORURINE YELLOW 08/08/2019 1440   APPEARANCEUR HAZY (A) 08/08/2019 1440   LABSPEC 1.012 08/08/2019 1440   PHURINE 5.0 08/08/2019 1440   GLUCOSEU NEGATIVE  08/08/2019 1440   HGBUR NEGATIVE 08/08/2019 1440   BILIRUBINUR NEGATIVE 08/08/2019 1440   KETONESUR NEGATIVE 08/08/2019 1440   PROTEINUR >=300 (A) 08/08/2019 1440   NITRITE NEGATIVE 08/08/2019 1440   LEUKOCYTESUR NEGATIVE 08/08/2019 1440    ----------------------------------------------------------------------------------------------------------------   Imaging results:   Dg Chest 2 View  Result Date: 08/08/2019 CLINICAL DATA:  Shortness of breath EXAM: CHEST - 2 VIEW COMPARISON:  06/21/2019 FINDINGS: Small left pleural effusion. No focal airspace consolidation or pulmonary edema. No pneumothorax. Normal cardiomediastinal contours. IMPRESSION: Small left pleural effusion Electronically Signed   By: Ulyses Jarred M.D.   On: 08/08/2019 00:49   Dg Chest Port 1 View  Result Date: 08/19/2019 CLINICAL DATA:  Fever EXAM: PORTABLE CHEST 1 VIEW COMPARISON:  08/08/2019 FINDINGS: Right dialysis catheter remains in place, unchanged. Cardiomegaly, vascular congestion and bibasilar airspace opacities. Small bilateral effusions. No acute bony abnormality. IMPRESSION: Cardiomegaly with vascular congestion. Bilateral effusions with lower lobe airspace opacities. Findings could reflect atelectasis or infiltrates. Airspace opacity has improved slightly on the left, increasing slightly on the right. Electronically Signed   By: Rolm Baptise M.D.   On: 08/19/2019 00:18   Dg Chest Port 1 View  Result Date: 08/08/2019 CLINICAL DATA:  Insertion of dialysis catheter EXAM: PORTABLE CHEST 1 VIEW COMPARISON:  08/08/2019 FINDINGS: Right internal jugular dialysis catheter is been placed with the tip at the cavoatrial junction. No pneumothorax. Cardiomegaly. Layering left effusion with left lower lobe atelectasis or infiltrate. Minimal right base atelectasis. Mild vascular congestion. IMPRESSION: Right dialysis catheter placement with the tip at the cavoatrial junction. No pneumothorax. Cardiomegaly, vascular congestion.  Layering left effusion with left base atelectasis or infiltrate. Right base atelectasis. Electronically Signed   By: Rolm Baptise M.D.   On: 08/08/2019 18:31   Dg Fluoro Guide Cv Line-no Report  Result Date: 08/08/2019 Fluoroscopy was utilized by the requesting physician.  No radiographic interpretation.   Vas [place] Upper Ext Vein Mapping (pre-op Avf)  Result Date: 08/08/2019 UPPER EXTREMITY VEIN MAPPING  Indications: Pre-access. Comparison Study: No prior studies. Performing Technologist: Oliver Hum RVT  Examination Guidelines: A complete evaluation includes B-mode imaging, spectral Doppler, color Doppler, and power Doppler as needed of all accessible portions of each vessel. Bilateral testing is considered an integral part of a complete examination. Limited examinations for reoccurring indications may be performed as noted. +-----------------+-------------+----------+--------+ Right Cephalic   Diameter (cm)Depth (cm)Findings +-----------------+-------------+----------+--------+ Shoulder             0.22  0.48            +-----------------+-------------+----------+--------+ Prox upper arm       0.17        0.22            +-----------------+-------------+----------+--------+ Mid upper arm        0.19        0.36            +-----------------+-------------+----------+--------+ Dist upper arm       0.27        0.18            +-----------------+-------------+----------+--------+ Antecubital fossa    0.20        0.19            +-----------------+-------------+----------+--------+ Prox forearm         0.13        0.26            +-----------------+-------------+----------+--------+ Mid forearm          0.14        0.23            +-----------------+-------------+----------+--------+ Dist forearm         0.10        0.20            +-----------------+-------------+----------+--------+ +-----------------+-------------+----------+---------+ Right Basilic     Diameter (cm)Depth (cm)Findings  +-----------------+-------------+----------+---------+ Shoulder             0.24        0.72             +-----------------+-------------+----------+---------+ Dist upper arm       0.24        0.37   branching +-----------------+-------------+----------+---------+ Antecubital fossa    0.07        0.27             +-----------------+-------------+----------+---------+ Prox forearm         0.10        0.16             +-----------------+-------------+----------+---------+ Mid forearm          0.06        0.13             +-----------------+-------------+----------+---------+ Distal forearm       0.06        0.20             +-----------------+-------------+----------+---------+ +-----------------+-------------+----------+--------------+ Left Cephalic    Diameter (cm)Depth (cm)   Findings    +-----------------+-------------+----------+--------------+ Shoulder             0.26        0.43                  +-----------------+-------------+----------+--------------+ Prox upper arm       0.25        0.43                  +-----------------+-------------+----------+--------------+ Mid upper arm        0.17        0.32                  +-----------------+-------------+----------+--------------+ Dist upper arm       0.19        0.27                  +-----------------+-------------+----------+--------------+ Antecubital fossa    0.24        0.20     branching    +-----------------+-------------+----------+--------------+  Prox forearm                            not visualized +-----------------+-------------+----------+--------------+ Mid forearm          0.14        0.21                  +-----------------+-------------+----------+--------------+ Dist forearm         0.16        0.24                  +-----------------+-------------+----------+--------------+ +-----------------+-------------+----------+---------+  Left Basilic     Diameter (cm)Depth (cm)Findings  +-----------------+-------------+----------+---------+ Shoulder             0.30        0.86             +-----------------+-------------+----------+---------+ Mid upper arm        0.22        0.82             +-----------------+-------------+----------+---------+ Dist upper arm       0.21        0.52   branching +-----------------+-------------+----------+---------+ Antecubital fossa    0.11        0.17             +-----------------+-------------+----------+---------+ Prox forearm         0.06        0.12             +-----------------+-------------+----------+---------+ Mid forearm          0.08        0.12             +-----------------+-------------+----------+---------+ Distal forearm       0.08        0.18             +-----------------+-------------+----------+---------+ *See table(s) above for measurements and observations.  Diagnosing physician: Deitra Mayo MD Electronically signed by Deitra Mayo MD on 08/08/2019 at 6:36:20 PM.    Final       Assessment & Plan  Anemia with guaiac positive stools in a patient with end-stage renal disease on hemodialysis GI on consult Protonix IV drip N.p.o. after midnight for EGD  ESRD on hemodialysis Consult nephrology for hemodialysis in a.m.  History of CVA Off aspirin  Hypertension We will hold some of his medications including Norvasc due to soft blood pressure      DVT Prophylaxis SCDs  AM Labs Ordered, also please review Full Orders    Code Status full  Disposition Plan: Home  Time spent in minutes : 42 minutes  Condition GUARDED   @SIGNATURE @

## 2019-08-26 NOTE — ED Provider Notes (Signed)
[place] EMERGENCY DEPARTMENT Provider Note   CSN: [id number] Arrival date & time: 08/26/19  1026     History   Chief Complaint Chief Complaint  Patient presents with  . Abnormal Lab    HPI William [name] is a 33 y.o. male.     Pt presents to the ED today with low hemoglobin.  The pt had outpatient labs which showed a hgb of 6.3.  He has a hx of chronic anemia due to ESRD.  He was admitted from 9/4-8 for a right AV graft infection that was placed by Dr. Scot Dock on 9/1.  He's been treated with vanc and zosyn at dialysis (MWF).  He has been going to dialysis.  Pt did receive 1 unit of prbcs while admitted in the hospital.  Pt said he was given some stool cards at dialysis, but he has not done them.  He has not seen GI.  The pt denies any bloody stools.  He does not think he's had black stool, but is unsure.     Past Medical History:  Diagnosis Date  . Anemia   . Drug abuse, cocaine type ([place]) 06/23/2019  . ESRD (end stage renal disease) ([place]) 08/08/2019  . Hyperlipidemia   . Hypertension   . Stroke [facility]) 06/2019    Patient Active Problem List   Diagnosis Date Noted  . Anemia 08/26/2019  . Arm swelling   . Device, implant, or graft complication   . Fever   . Symptomatic anemia   . Infection of AV graft for dialysis ([place]) 08/18/2019  . Sepsis ([place]) 08/18/2019  . Anemia in ESRD (end-stage renal disease) ([place]) 08/18/2019  . Hypokalemia 08/18/2019  . ESRD on dialysis ([place]) 08/08/2019  . Anemia of chronic disease 07/08/2019  . AKI (acute kidney injury) ([place]) 07/08/2019  . HTN (hypertension) 07/07/2019  . Stroke ([place]) 07/07/2019  . HLD (hyperlipidemia) 07/07/2019  . Polysubstance abuse ([place]) 07/07/2019  . Tobacco abuse 07/07/2019  . Uncontrolled hypertension   . Acute kidney injury ([place])   . PRES (posterior reversible encephalopathy syndrome) 06/29/2019  . 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 Hypertensive emergency  06/22/2019  . Acute renal failure [facility])     Past Surgical History:  Procedure Laterality Date  . AV FISTULA PLACEMENT Right 08/15/2019   Procedure: INSERTION OF ARTERIOVENOUS (AV) GORE-TEX GRAFT ARM. Using 4-69mm STRETCH Goretex.;  Surgeon: Angelia Mould, MD;  Location: [facility] OR;  Service: Vascular;  Laterality: Right;  . INSERTION OF DIALYSIS CATHETER Right 08/08/2019   Procedure: INSERTION OF right internal jugular DIALYSIS CATHETER;  Surgeon: Angelia Mould, MD;  Location: [place];  Service: Vascular;  Laterality: Right;  . RADIOLOGY WITH ANESTHESIA N/A 06/23/2019   Procedure: MRI OF VESSELS;  Surgeon: Radiologist, Medication, MD;  Location: [place];  Service: Radiology;  Laterality: N/A;        Home Medications    Prior to Admission medications   Medication Sig Start Date End Date Taking? Authorizing Provider  amLODipine (NORVASC) 10 MG tablet Take 1 tablet (10 mg total) by mouth at bedtime. 07/08/19  Yes Debbe Odea, MD  atorvastatin (LIPITOR) 40 MG tablet Take 1 tablet (40 mg total) by mouth daily at 6 PM. 06/29/19  Yes Adhikari, Tamsen Meek, MD  carvedilol (COREG) 25 MG tablet Take 1 tablet (25 mg total) by mouth 2 (two) times daily with a meal. 06/29/19  Yes Adhikari, Amrit, MD  chlorproMAZINE (THORAZINE) 10 MG tablet Take 1 tablet (10 mg total) by  mouth 3 (three) times daily as needed for hiccoughs. 08/10/19  Yes Shelly Coss, MD  folic acid-vitamin b complex-vitamin c-selenium-zinc (DIALYVITE) 3 MG TABS tablet Take 1 tablet by mouth daily.   Yes [provider]  hydrALAZINE (APRESOLINE) 100 MG tablet Take 1 tablet (100 mg total) by mouth every 8 (eight) hours. 07/08/19  Yes Debbe Odea, MD  minoxidil (LONITEN) 2.5 MG tablet Take 2 tablets (5 mg total) by mouth 2 (two) times daily. 08/20/19  Yes Debbe Odea, MD  nicotine (NICODERM CQ - DOSED IN MG/24 HR) 7 mg/24hr patch Place 7 mg onto the skin daily.   Yes [provider]  oxyCODONE-acetaminophen  (PERCOCET/ROXICET) 5-325 MG tablet Take 1 tablet by mouth every 4 (four) hours as needed for moderate pain. 08/20/19  Yes Debbe Odea, MD  sucroferric oxyhydroxide (VELPHORO) 500 MG chewable tablet Chew 500 mg by mouth 3 (three) times daily with meals. 08/21/19  Yes [provider]  aspirin 81 MG chewable tablet Chew 1 tablet (81 mg total) by mouth daily. Patient taking differently: Chew 81 mg by mouth as needed for mild pain.  06/30/19   Shelly Coss, MD  clopidogrel (PLAVIX) 75 MG tablet Take 75 mg by mouth daily. 08/11/19   [provider]  spironolactone (ALDACTONE) 25 MG tablet Take 25 mg by mouth daily.    [provider]    Family History Family History  Problem Relation Age of Onset  . Diabetes Sister   . Diabetes Maternal Uncle   . Hypertension Father   . Hypertension Paternal Grandfather   . Prostate cancer Paternal Grandfather   . Diabetes Sister   . Renal Disease Neg Hx     Social History Social History   Tobacco Use  . Smoking status: Former Smoker    Packs/day: 0.00    Years: 20.00    Pack years: 0.00    Quit date: 06/2019    Years since quitting: 0.2  . Smokeless tobacco: Never Used  Substance Use Topics  . Alcohol use: Not Currently    Comment: 2-3 beers a day; stopped with 7/20 hospitalization  . Drug use: Yes    Types: Marijuana, Cocaine, Benzodiazepines    Comment: denies use since 7/20 hospitalization     Allergies   Reglan [metoclopramide]   Review of Systems Review of Systems  Skin: Positive for pallor.  All other systems reviewed and are negative.    Physical Exam Updated Vital Signs BP 138/88   Pulse 86   Temp 97.6 F (36.4 C) (Oral)   Resp 16   SpO2 98%   Physical Exam Vitals signs and nursing note reviewed.  Constitutional:      Appearance: Normal appearance.  HENT:     Head: Normocephalic and atraumatic.     Right Ear: External ear normal.     Left Ear: External ear normal.     Nose: Nose normal.      Mouth/Throat:     Mouth: Mucous membranes are moist.     Pharynx: Oropharynx is clear.  Eyes:     Extraocular Movements: Extraocular movements intact.     Conjunctiva/sclera: Conjunctivae normal.     Pupils: Pupils are equal, round, and reactive to light.  Neck:     Musculoskeletal: Normal range of motion and neck supple.  Cardiovascular:     Rate and Rhythm: Normal rate and regular rhythm.     Pulses: Normal pulses.     Heart sounds: Normal heart sounds.  Pulmonary:  Effort: Pulmonary effort is normal.     Breath sounds: Normal breath sounds.  Abdominal:     General: Abdomen is flat. Bowel sounds are normal.     Palpations: Abdomen is soft.  Genitourinary:    Rectum: Guaiac result positive.  Musculoskeletal:     Comments: AVF with good thrill.  Mild redness and swelling (per pt, it is improving)  Skin:    General: Skin is warm.     Capillary Refill: Capillary refill takes less than 2 seconds.  Neurological:     General: No focal deficit present.     Mental Status: He is alert and oriented to person, place, and time.  Psychiatric:        Mood and Affect: Mood normal.        Behavior: Behavior normal.      ED Treatments / Results  Labs (all labs ordered are listed, but only abnormal results are displayed) Labs Reviewed  CBC WITH DIFFERENTIAL/PLATELET - Abnormal; Notable for the following components:      Result Value   RBC 2.06 (*)    Hemoglobin 6.3 (*)    HCT 20.0 (*)    All other components within normal limits  COMPREHENSIVE METABOLIC PANEL - Abnormal; Notable for the following components:   Potassium 3.1 (*)    Chloride 94 (*)    Glucose, Bld 141 (*)    Creatinine, Ser 7.04 (*)    Total Protein 6.3 (*)    Albumin 3.1 (*)    GFR calc non Af Amer 9 (*)    GFR calc Af Amer 11 (*)    All other components within normal limits  POC OCCULT BLOOD, ED - Abnormal; Notable for the following components:   Fecal Occult Bld POSITIVE (*)    All other components  within normal limits  SARS CORONAVIRUS 2 (HOSPITAL ORDER, [facility])  PROTIME-INR  TYPE AND SCREEN  PREPARE RBC (CROSSMATCH)    EKG None  Radiology No results found.  Procedures Procedures (including critical care time)  Medications Ordered in ED Medications  pantoprazole (PROTONIX) 80 mg in sodium chloride 0.9 % 100 mL IVPB (has no administration in time range)  pantoprazole (PROTONIX) 80 mg in sodium chloride 0.9 % 250 mL (0.32 mg/mL) infusion (has no administration in time range)  0.9 %  sodium chloride infusion (Manually program via Guardrails IV Fluids) ( Intravenous New Bag/Given 08/26/19 1354)     Initial Impression / Assessment and Plan / ED Course  I have reviewed the triage vital signs and the nursing notes.  Pertinent labs & imaging results that were available during my care of the patient were reviewed by me and considered in my medical decision making (see chart for details).       Pt is anemic again with a positive guaiac.  Pt will be transfused 1 unit prbc. Protonix drip ordered.  Pt d/w Dr. Laren Everts for admission.  Pt d/w GI (Dr. Therisa Doyne) who will see pt in consult.  Nishaan Kinner was evaluated in Emergency Department on 08/26/2019 for the symptoms described in the history of present illness. He was evaluated in the context of the global COVID-19 pandemic, which necessitated consideration that the patient might be at risk for infection with the SARS-CoV-2 virus that causes COVID-19. Institutional protocols and algorithms that pertain to the evaluation of patients at risk for COVID-19 are in a state of rapid change based on information released by regulatory bodies including the CDC and federal  and state organizations. These policies and algorithms were followed during the patient's care in the ED.  CRITICAL CARE Performed by: Isla Pence   Total critical care time: 30 minutes  Critical care time was exclusive of separately  billable procedures and treating other patients.  Critical care was necessary to treat or prevent imminent or life-threatening deterioration.  Critical care was time spent personally by me on the following activities: development of treatment plan with patient and/or surrogate as well as nursing, discussions with consultants, evaluation of patient's response to treatment, examination of patient, obtaining history from patient or surrogate, ordering and performing treatments and interventions, ordering and review of laboratory studies, ordering and review of radiographic studies, pulse oximetry and re-evaluation of patient's condition.  Final Clinical Impressions(s) / ED Diagnoses   Final diagnoses:  Symptomatic anemia  ESRD on hemodialysis [facility])  UGI bleed    ED Discharge Orders    None       Isla Pence, MD 08/26/19 1440

## 2019-08-27 ENCOUNTER — Encounter (HOSPITAL_COMMUNITY)
Admission: EM | Disposition: A | Discharge status: Home / Self Care | Payer: Self-pay | Source: Home / Self Care | Attending: Internal Medicine

## 2019-08-27 ENCOUNTER — Inpatient Hospital Stay (HOSPITAL_COMMUNITY): Payer: Medicaid Other | Admitting: Anesthesiology

## 2019-08-27 ENCOUNTER — Encounter (HOSPITAL_COMMUNITY): Payer: Self-pay | Admitting: Certified Registered Nurse Anesthetist

## 2019-08-27 DIAGNOSIS — I10 Essential (primary) hypertension: Secondary | ICD-10-CM

## 2019-08-27 HISTORY — PX: ESOPHAGOGASTRODUODENOSCOPY (EGD) WITH PROPOFOL: SHX5813

## 2019-08-27 HISTORY — PX: BIOPSY: SHX5522

## 2019-08-27 LAB — CBC
HCT: 22.3 % — ABNORMAL LOW (ref 39.0–52.0)
Hemoglobin: 7.2 g/dL — ABNORMAL LOW (ref 13.0–17.0)
MCH: 30.4 pg (ref 26.0–34.0)
MCHC: 32.3 g/dL (ref 30.0–36.0)
MCV: 94.1 fL (ref 80.0–100.0)
Platelets: 380 10*3/uL (ref 150–400)
RBC: 2.37 MIL/uL — ABNORMAL LOW (ref 4.22–5.81)
RDW: 14.4 % (ref 11.5–15.5)
WBC: 6.6 10*3/uL (ref 4.0–10.5)
nRBC: 0 % (ref 0.0–0.2)

## 2019-08-27 LAB — BASIC METABOLIC PANEL
Anion gap: 13 (ref 5–15)
BUN: 15 mg/dL (ref 6–20)
CO2: 29 mmol/L (ref 22–32)
Calcium: 9.2 mg/dL (ref 8.9–10.3)
Chloride: 96 mmol/L — ABNORMAL LOW (ref 98–111)
Creatinine, Ser: 8.89 mg/dL — ABNORMAL HIGH (ref 0.61–1.24)
GFR calc Af Amer: 8 mL/min — ABNORMAL LOW (ref >60)
GFR calc non Af Amer: 7 mL/min — ABNORMAL LOW (ref >60)
Glucose, Bld: 101 mg/dL — ABNORMAL HIGH (ref 70–99)
Potassium: 3 mmol/L — ABNORMAL LOW (ref 3.5–5.1)
Sodium: 138 mmol/L (ref 135–145)

## 2019-08-27 LAB — BPAM RBC
Blood Product Expiration Date: 202010172359
ISSUE DATE / TIME: 202009121335
Unit Type and Rh: 5100

## 2019-08-27 LAB — TYPE AND SCREEN
ABO/RH(D): O POS
Antibody Screen: NEGATIVE
Unit division: 0

## 2019-08-27 SURGERY — ESOPHAGOGASTRODUODENOSCOPY (EGD) WITH PROPOFOL
Anesthesia: Monitor Anesthesia Care

## 2019-08-27 MED ORDER — OXYCODONE-ACETAMINOPHEN 5-325 MG PO TABS: 1.0000 | ORAL_TABLET | Freq: Four times a day (QID) | ORAL | 0 refills | Status: DC | PRN

## 2019-08-27 MED ORDER — PANTOPRAZOLE SODIUM 40 MG PO TBEC
40.0000 mg | DELAYED_RELEASE_TABLET | Freq: Every day | ORAL | Status: DC
  Administered 2019-08-27: 14:00:00 40 mg via ORAL
  Filled 2019-08-27: qty 1

## 2019-08-27 MED ORDER — PROPOFOL 500 MG/50ML IV EMUL
INTRAVENOUS | Status: DC | PRN
  Administered 2019-08-27: 150 ug/kg/min via INTRAVENOUS

## 2019-08-27 MED ORDER — PANTOPRAZOLE SODIUM 40 MG PO TBEC: 40.0000 mg | DELAYED_RELEASE_TABLET | Freq: Every day | ORAL | 0 refills | Status: AC

## 2019-08-27 MED ORDER — VANCOMYCIN HCL IN DEXTROSE 750-5 MG/150ML-% IV SOLN: 750.0000 mg | INTRAVENOUS | Status: DC

## 2019-08-27 MED ORDER — MINOXIDIL 2.5 MG PO TABS
2.5000 mg | ORAL_TABLET | Freq: Two times a day (BID) | ORAL | Status: DC
  Filled 2019-08-27: qty 1

## 2019-08-27 MED ORDER — AMLODIPINE BESYLATE 10 MG PO TABS: 10.0000 mg | ORAL_TABLET | Freq: Every day | ORAL | Status: DC

## 2019-08-27 MED ORDER — PROPOFOL 10 MG/ML IV BOLUS
INTRAVENOUS | Status: DC | PRN
  Administered 2019-08-27: 20 mg via INTRAVENOUS

## 2019-08-27 MED ORDER — CHLORHEXIDINE GLUCONATE CLOTH 2 % EX PADS
6.0000 | MEDICATED_PAD | Freq: Every day | CUTANEOUS | Status: DC
  Administered 2019-08-27: 6 via TOPICAL

## 2019-08-27 MED ORDER — SODIUM CHLORIDE 0.9 % IV SOLN: 2.0000 g | INTRAVENOUS | Status: DC

## 2019-08-27 MED ORDER — SODIUM CHLORIDE 0.9 % IV SOLN
INTRAVENOUS | Status: DC | PRN
  Administered 2019-08-27: 12:00:00 via INTRAVENOUS

## 2019-08-27 SURGICAL SUPPLY — 15 items

## 2019-08-27 NOTE — Anesthesia Postprocedure Evaluation (Signed)
Anesthesia Post Note  Patient: William [name]  Procedure(s) Performed: ESOPHAGOGASTRODUODENOSCOPY (EGD) WITH PROPOFOL (N/A ) BIOPSY     Patient location during evaluation: PACU Anesthesia Type: MAC Level of consciousness: awake and alert Pain management: pain level controlled Vital Signs Assessment: post-procedure vital signs reviewed and stable Respiratory status: spontaneous breathing, nonlabored ventilation, respiratory function stable and patient connected to nasal cannula oxygen Cardiovascular status: stable and blood pressure returned to baseline Postop Assessment: no apparent nausea or vomiting Anesthetic complications: no    Last Vitals:  Vitals:   08/27/19 1230 08/27/19 1240  BP: 131/85 (!) 155/95  Pulse: 93 90  Resp: 17 (!) 29  Temp: 37.1 C   SpO2: 100% 99%    Last Pain:  Vitals:   08/27/19 1240  TempSrc:   PainSc: 0-No pain                 Maisha Bogen P [facility]

## 2019-08-27 NOTE — Op Note (Signed)
[facility] Patient Name: William [name] Procedure Date : 08/27/2019 MRN: [id number] Attending MD: Ronnette Juniper , MD Date of Birth: 1986-04-13 CSN: [id number] Age: 33 Admit Type: Inpatient Procedure:                Upper GI endoscopy Indications:              Iron deficiency anemia, Occult blood in stool, dark                            stools Providers:                Ronnette Juniper, MD, Ashley Jacobs, RN, Laverda Sorenson,                            Technician, Rejeana Brock, CRNA Referring MD:              Medicines:                Monitored Anesthesia Care Complications:            No immediate complications. Estimated blood loss:                            Minimal. Estimated Blood Loss:     Estimated blood loss was minimal. Procedure:                Pre-Anesthesia Assessment:                           - Prior to the procedure, a History and Physical                            was performed, and patient medications and                            allergies were reviewed. The patient's tolerance of                            previous anesthesia was also reviewed. The risks                            and benefits of the procedure and the sedation                            options and risks were discussed with the patient.                            All questions were answered, and informed consent                            was obtained. Prior Anticoagulants: The patient has                            taken no previous anticoagulant or antiplatelet                            agents.  ASA Grade Assessment: III - A patient with                            severe systemic disease. After reviewing the risks                            and benefits, the patient was deemed in                            satisfactory condition to undergo the procedure.                           After obtaining informed consent, the endoscope was                            passed under direct vision.  Throughout the                            procedure, the patient's blood pressure, pulse, and                            oxygen saturations were monitored continuously. The                            [id number]) Olympus gastroscope was                            introduced through the mouth, and advanced to the                            second part of duodenum. The upper GI endoscopy was                            accomplished without difficulty. The patient                            tolerated the procedure well. Scope In: Scope Out: Findings:      The examined esophagus was normal.      The Z-line was regular and was found 39 cm from the incisors.      Localized mildly erythematous mucosa without bleeding was found in the       gastric antrum. Biopsies were taken with a cold forceps for Helicobacter       pylori testing.      The cardia and gastric fundus were normal on retroflexion.      Patchy mild inflammation characterized by congestion (edema), erythema       and friability was found in the duodenal bulb and in the first portion       of the duodenum. Biopsies were taken with a cold forceps for histology.       THe mucosa in second portion of duodenum appeared to have whitish patchy       areas of uncertain significance. Impression:               - Normal esophagus.                           -  Z-line regular, 39 cm from the incisors.                           - Erythematous mucosa in the antrum. Biopsied.                           - Duodenitis. Biopsied. Moderate Sedation:      Patient did not receive moderate sedation for this procedure, but       instead received monitored anesthesia care. Recommendation:           - Resume regular diet.                           - Continue present medications.                           - Await pathology results.                           - Use Protonix (pantoprazole) 40 mg PO daily for 4                            weeks.                            - Rectal exam showed green brown stools. Procedure Code(s):        --- Professional ---                           [phone], Esophagogastroduodenoscopy, flexible,                            transoral; with biopsy, single or multiple Diagnosis Code(s):        --- Professional ---                           K31.89, Other diseases of stomach and duodenum                           K29.80, Duodenitis without bleeding                           D50.9, Iron deficiency anemia, unspecified                           R19.5, Other fecal abnormalities CPT copyright 2019 [employer]. All rights reserved. The codes documented in this report are preliminary and upon coder review may  be revised to meet current compliance requirements. Ronnette Juniper, MD 08/27/2019 12:31:08 PM This report has been signed electronically. Number of Addenda: 0

## 2019-08-27 NOTE — Plan of Care (Signed)

## 2019-08-27 NOTE — Transfer of Care (Signed)
Immediate Anesthesia Transfer of Care Note  Patient: William [name]  Procedure(s) Performed: ESOPHAGOGASTRODUODENOSCOPY (EGD) WITH PROPOFOL (N/A ) BIOPSY  Patient Location: PACU  Anesthesia Type:MAC  Level of Consciousness: awake and drowsy  Airway & Oxygen Therapy: Patient Spontanous Breathing and Patient connected to nasal cannula oxygen  Post-op Assessment: Report given to RN and Post -op Vital signs reviewed and stable  Post vital signs: Reviewed and stable  Last Vitals:  Vitals Value Taken Time  BP    Temp    Pulse    Resp    SpO2      Last Pain:  Vitals:   08/27/19 1200  TempSrc: Temporal  PainSc: 0-No pain         Complications: No apparent anesthesia complications

## 2019-08-27 NOTE — Discharge Summary (Signed)
Physician Discharge Summary  Eton Sentz [id number] DOB: Jun 20, 1986 DOA: 08/26/2019  PCP: Patient, No Pcp Per  Admit date: 08/26/2019 Discharge date: 08/27/2019  Admitted From: Home  Disposition:  Home   Recommendations for Outpatient Follow-up and new medication changes:  1. Follow up with Primary care in 7 days.  2. Patient will continue pantoprazole 40 mg daily.  3. Follow cell count as outpatient.  4. Antrum and duodenum biopsies pending.  Home Health: no   Equipment/Devices: no    Discharge Condition: stable  CODE STATUS: full  Diet recommendation: renal prudent diet.   Brief/Interim Summary: 33 year old male who presented with symptomatic anemia.  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  In the outpatient hemodialysis unit his hemoglobin was 6.3.  He was referred to the hospital for further evaluation.  On his initial physical examination his blood pressure was 168/95, pulse rate 86, temperature 98, respiratory rate 17, oxygen saturation her percent.  His lungs were clear to auscultation bilaterally, heart S1-S2 present rhythmic, the abdomen soft nontender, no lower extremity edema. Sodium 138, potassium 3.1, chloride 94, BUN 30, glucose 141, BUN 9, creatinine 7.0, white count 5.7, hemoglobin 6.3, hematocrit 20.0, platelets 358.  Patient was admitted to the hospital with a working diagnosis of symptomatic anemia to rule out acute blood loss.  1.  Symptomatic anemia, likely due to chronic renal disease/ iron deficiency, ruled out upper or lower gastrointestinal bleeding.  Patient was admitted to the medical ward, he received 1 unit of packed red blood cells.  He underwent upper endoscopy which showed normal esophagus, erythematous mucosa in the antrum, biopsied.  Duodenitis, biopsied.  Repeat fecal occult blood  was negative.  His discharge hemoglobin is 7.2. He has been placed on pantoprazole 40 mg daily.  Follow-up cell count in 7 days, biopsy results pending. Continue with oral iron supplementation.   2.  End-stage renal disease on hemodialysis.  Patient did not require inpatient hemodialysis.  Axis right AV graft, placed September 1, temporal dialysis catheter right jugular vein.  Patient will resume his dialysis tomorrow Monday.  3.  Infected AV graft.  Continue antibiotic therapy as scheduled, during hemodialysis.  Patient continues to have pain, will order oxycodone for pain control  4.  History of CVA.  Continue clopidogrel and atorvastatin.  Currently off aspirin.  5.  Hypertension.  Continue blood pressure control with amlodipine, carvedilol, hydralazine, minoxidil and spironolactone.  6.  Dyslipidemia.  Continue atorvastatin.  Discharge Diagnoses:  Active Problems:   Anemia    Discharge Instructions   Allergies as of 08/27/2019      Reactions   Reglan [metoclopramide] Nausea And Vomiting   Develops akathisia      Medication List    STOP taking these medications   aspirin 81 MG chewable tablet     TAKE these medications   amLODipine 10 MG tablet Commonly known as: NORVASC Take 1 tablet (10 mg total) by mouth at bedtime.   atorvastatin 40 MG tablet Commonly known as: LIPITOR Take 1 tablet (40 mg total) by mouth daily at 6 PM.   carvedilol 25 MG tablet Commonly known as: COREG Take 1 tablet (25 mg total) by mouth 2 (two) times daily with a meal.   chlorproMAZINE 10 MG tablet Commonly known as: THORAZINE Take 1 tablet (10 mg total) by mouth 3 (three) times daily as needed  for hiccoughs.   clopidogrel 75 MG tablet Commonly known as: PLAVIX Take 75 mg by mouth daily.   folic acid-vitamin b complex-vitamin c-selenium-zinc 3 MG Tabs tablet Take 1 tablet by mouth daily.   hydrALAZINE 100 MG tablet Commonly known as: APRESOLINE Take 1 tablet (100 mg total) by mouth  every 8 (eight) hours.   minoxidil 2.5 MG tablet Commonly known as: LONITEN Take 2 tablets (5 mg total) by mouth 2 (two) times daily.   nicotine 7 mg/24hr patch Commonly known as: NICODERM CQ - dosed in mg/24 hr Place 7 mg onto the skin daily.   oxyCODONE-acetaminophen 5-325 MG tablet Commonly known as: Percocet Take 1 tablet by mouth every 6 (six) hours as needed for severe pain. What changed:   when to take this  reasons to take this   pantoprazole 40 MG tablet Commonly known as: Protonix Take 1 tablet (40 mg total) by mouth daily.   spironolactone 25 MG tablet Commonly known as: ALDACTONE Take 25 mg by mouth daily.   Velphoro 500 MG chewable tablet Generic drug: sucroferric oxyhydroxide Chew 500 mg by mouth 3 (three) times daily with meals.       Allergies  Allergen Reactions  . Reglan [Metoclopramide] Nausea And Vomiting    Develops akathisia    Consultations:  GI    Procedures/Studies: Dg Chest 2 View  Result Date: 08/08/2019 CLINICAL DATA:  Shortness of breath EXAM: CHEST - 2 VIEW COMPARISON:  06/21/2019 FINDINGS: Small left pleural effusion. No focal airspace consolidation or pulmonary edema. No pneumothorax. Normal cardiomediastinal contours. IMPRESSION: Small left pleural effusion Electronically Signed   By: Ulyses Jarred M.D.   On: 08/08/2019 00:49   Dg Chest Port 1 View  Result Date: 08/19/2019 CLINICAL DATA:  Fever EXAM: PORTABLE CHEST 1 VIEW COMPARISON:  08/08/2019 FINDINGS: Right dialysis catheter remains in place, unchanged. Cardiomegaly, vascular congestion and bibasilar airspace opacities. Small bilateral effusions. No acute bony abnormality. IMPRESSION: Cardiomegaly with vascular congestion. Bilateral effusions with lower lobe airspace opacities. Findings could reflect atelectasis or infiltrates. Airspace opacity has improved slightly on the left, increasing slightly on the right. Electronically Signed   By: Rolm Baptise M.D.   On: 08/19/2019 00:18    Dg Chest Port 1 View  Result Date: 08/08/2019 CLINICAL DATA:  Insertion of dialysis catheter EXAM: PORTABLE CHEST 1 VIEW COMPARISON:  08/08/2019 FINDINGS: Right internal jugular dialysis catheter is been placed with the tip at the cavoatrial junction. No pneumothorax. Cardiomegaly. Layering left effusion with left lower lobe atelectasis or infiltrate. Minimal right base atelectasis. Mild vascular congestion. IMPRESSION: Right dialysis catheter placement with the tip at the cavoatrial junction. No pneumothorax. Cardiomegaly, vascular congestion. Layering left effusion with left base atelectasis or infiltrate. Right base atelectasis. Electronically Signed   By: Rolm Baptise M.D.   On: 08/08/2019 18:31   Dg Fluoro Guide Cv Line-no Report  Result Date: 08/08/2019 Fluoroscopy was utilized by the requesting physician.  No radiographic interpretation.   Vas [place] Upper Ext Vein Mapping (pre-op Avf)  Result Date: 08/08/2019 UPPER EXTREMITY VEIN MAPPING  Indications: Pre-access. Comparison Study: No prior studies. Performing Technologist: Oliver Hum RVT  Examination Guidelines: A complete evaluation includes B-mode imaging, spectral Doppler, color Doppler, and power Doppler as needed of all accessible portions of each vessel. Bilateral testing is considered an integral part of a complete examination. Limited examinations for reoccurring indications may be performed as noted. +-----------------+-------------+----------+--------+ Right Cephalic   Diameter (cm)Depth (cm)Findings +-----------------+-------------+----------+--------+ Shoulder  0.22        0.48            +-----------------+-------------+----------+--------+ Prox upper arm       0.17        0.22            +-----------------+-------------+----------+--------+ Mid upper arm        0.19        0.36            +-----------------+-------------+----------+--------+ Dist upper arm       0.27        0.18             +-----------------+-------------+----------+--------+ Antecubital fossa    0.20        0.19            +-----------------+-------------+----------+--------+ Prox forearm         0.13        0.26            +-----------------+-------------+----------+--------+ Mid forearm          0.14        0.23            +-----------------+-------------+----------+--------+ Dist forearm         0.10        0.20            +-----------------+-------------+----------+--------+ +-----------------+-------------+----------+---------+ Right Basilic    Diameter (cm)Depth (cm)Findings  +-----------------+-------------+----------+---------+ Shoulder             0.24        0.72             +-----------------+-------------+----------+---------+ Dist upper arm       0.24        0.37   branching +-----------------+-------------+----------+---------+ Antecubital fossa    0.07        0.27             +-----------------+-------------+----------+---------+ Prox forearm         0.10        0.16             +-----------------+-------------+----------+---------+ Mid forearm          0.06        0.13             +-----------------+-------------+----------+---------+ Distal forearm       0.06        0.20             +-----------------+-------------+----------+---------+ +-----------------+-------------+----------+--------------+ Left Cephalic    Diameter (cm)Depth (cm)   Findings    +-----------------+-------------+----------+--------------+ Shoulder             0.26        0.43                  +-----------------+-------------+----------+--------------+ Prox upper arm       0.25        0.43                  +-----------------+-------------+----------+--------------+ Mid upper arm        0.17        0.32                  +-----------------+-------------+----------+--------------+ Dist upper arm       0.19        0.27                   +-----------------+-------------+----------+--------------+ Antecubital fossa    0.24  0.20     branching    +-----------------+-------------+----------+--------------+ Prox forearm                            not visualized +-----------------+-------------+----------+--------------+ Mid forearm          0.14        0.21                  +-----------------+-------------+----------+--------------+ Dist forearm         0.16        0.24                  +-----------------+-------------+----------+--------------+ +-----------------+-------------+----------+---------+ Left Basilic     Diameter (cm)Depth (cm)Findings  +-----------------+-------------+----------+---------+ Shoulder             0.30        0.86             +-----------------+-------------+----------+---------+ Mid upper arm        0.22        0.82             +-----------------+-------------+----------+---------+ Dist upper arm       0.21        0.52   branching +-----------------+-------------+----------+---------+ Antecubital fossa    0.11        0.17             +-----------------+-------------+----------+---------+ Prox forearm         0.06        0.12             +-----------------+-------------+----------+---------+ Mid forearm          0.08        0.12             +-----------------+-------------+----------+---------+ Distal forearm       0.08        0.18             +-----------------+-------------+----------+---------+ *See table(s) above for measurements and observations.  Diagnosing physician: Deitra Mayo MD Electronically signed by Deitra Mayo MD on 08/08/2019 at 6:36:20 PM.    Final       Procedures: upper endoscopy with no active bleeding.   Subjective: Patient is feeling better, no nausea or vomiting, no chest pain or dyspnea, no melena or hematochezia.   Discharge Exam: Vitals:   08/27/19 1200 08/27/19 1230  BP: (!) 185/105 131/85  Pulse: 88 93   Resp: 19 17  Temp: 98.4 F (36.9 C) 98.7 F (37.1 C)  SpO2: 95% 100%   Vitals:   08/27/19 0606 08/27/19 0738 08/27/19 1200 08/27/19 1230  BP: (!) 179/104 (!) 177/105 (!) 185/105 131/85  Pulse: 98 (!) 103 88 93  Resp: 19 20 19 17   Temp: 98.6 F (37 C) 98.2 F (36.8 C) 98.4 F (36.9 C) 98.7 F (37.1 C)  TempSrc: Oral Oral Temporal Temporal  SpO2: 99% 94% 95% 100%  Weight:      Height:        General: Not in pain or dyspnea.  Neurology: Awake and alert, non focal  E ENT: mild pallor, no icterus, oral mucosa moist Cardiovascular: No JVD. S1-S2 present, rhythmic, no gallops, rubs, or murmurs. No lower extremity edema. Pulmonary: vesicular breath sounds bilaterally, adequate air movement, no wheezing, rhonchi or rales. Gastrointestinal. Abdomen with no organomegaly, non tender, no rebound or guarding Skin. No rashes Musculoskeletal: no joint deformities/ edematous right upper extremity, no erythema or increased local temperature.  The results of significant diagnostics from this hospitalization (including imaging, microbiology, ancillary and laboratory) are listed below for reference.     Microbiology: Recent Results (from the past 240 hour(s))  Blood culture (routine x 2)     Status: None   Collection Time: 08/18/19  8:15 PM   Specimen: BLOOD RIGHT ARM  Result Value Ref Range Status   Specimen Description BLOOD RIGHT ARM  Final   Special Requests   Final    BOTTLES DRAWN AEROBIC AND ANAEROBIC Blood Culture results may not be optimal due to an excessive volume of blood received in culture bottles   Culture   Final    NO GROWTH 5 DAYS Performed at [facility], [place] [address], [place], [place] [address]    Report Status 08/23/2019 FINAL  Final  Blood culture (routine x 2)     Status: None   Collection Time: 08/18/19  8:22 PM   Specimen: BLOOD RIGHT HAND  Result Value Ref Range Status   Specimen Description BLOOD RIGHT HAND  Final   Special Requests   Final     BOTTLES DRAWN AEROBIC ONLY Blood Culture results may not be optimal due to an excessive volume of blood received in culture bottles   Culture   Final    NO GROWTH 5 DAYS Performed at [facility], [place] [address], [place], [place] [address]    Report Status 08/23/2019 FINAL  Final  SARS Coronavirus 2 [facility] order, Performed in [facility] hospital lab) Nasopharyngeal Nasopharyngeal Swab     Status: None   Collection Time: 08/19/19  2:40 PM   Specimen: Nasopharyngeal Swab  Result Value Ref Range Status   SARS Coronavirus 2 NEGATIVE NEGATIVE Final    Comment: (NOTE) If result is NEGATIVE SARS-CoV-2 target nucleic acids are NOT DETECTED. The SARS-CoV-2 RNA is generally detectable in upper and lower  respiratory specimens during the acute phase of infection. The lowest  concentration of SARS-CoV-2 viral copies this assay can detect is 250  copies / mL. A negative result does not preclude SARS-CoV-2 infection  and should not be used as the sole basis for treatment or other  patient management decisions.  A negative result may occur with  improper specimen collection / handling, submission of specimen other  than nasopharyngeal swab, presence of viral mutation(s) within the  areas targeted by this assay, and inadequate number of viral copies  (<250 copies / mL). A negative result must be combined with clinical  observations, patient history, and epidemiological information. If result is POSITIVE SARS-CoV-2 target nucleic acids are DETECTED. The SARS-CoV-2 RNA is generally detectable in upper and lower  respiratory specimens dur ing the acute phase of infection.  Positive  results are indicative of active infection with SARS-CoV-2.  Clinical  correlation with patient history and other diagnostic information is  necessary to determine patient infection status.  Positive results do  not rule out bacterial infection or co-infection with other viruses. If result is PRESUMPTIVE  POSTIVE SARS-CoV-2 nucleic acids MAY BE PRESENT.   A presumptive positive result was obtained on the submitted specimen  and confirmed on repeat testing.  While 2019 novel coronavirus  (SARS-CoV-2) nucleic acids may be present in the submitted sample  additional confirmatory testing may be necessary for epidemiological  and / or clinical management purposes  to differentiate between  SARS-CoV-2 and other Sarbecovirus currently known to infect humans.  If clinically indicated additional testing with an alternate test  methodology [phone]) is  advised. The SARS-CoV-2 RNA is generally  detectable in upper and lower respiratory sp ecimens during the acute  phase of infection. The expected result is Negative. Fact Sheet for Patients:  StrictlyIdeas.no Fact Sheet for Healthcare Providers: BankingDealers.co.za This test is not yet approved or cleared by the [place] FDA and has been authorized for detection and/or diagnosis of SARS-CoV-2 by FDA under an Emergency Use Authorization (EUA).  This EUA will remain in effect (meaning this test can be used) for the duration of the COVID-19 declaration under Section 564(b)(1) of the Act, 21 U.S.C. section 360bbb-3(b)(1), unless the authorization is terminated or revoked sooner. Performed at [facility], [place] [address] [place], [place] [address]   MRSA PCR Screening     Status: None   Collection Time: 08/20/19  9:10 AM   Specimen: Nasopharyngeal  Result Value Ref Range Status   MRSA by PCR NEGATIVE NEGATIVE Final    Comment:        The GeneXpert MRSA Assay (FDA approved for NASAL specimens only), is one component of a comprehensive MRSA colonization surveillance program. It is not intended to diagnose MRSA infection nor to guide or monitor treatment for MRSA infections. Performed at [facility], [place] [address], [place], [place] [address]   SARS Coronavirus 2 [facility]  order, Performed in [facility] hospital lab) Nasopharyngeal Nasopharyngeal Swab     Status: None   Collection Time: 08/26/19  2:18 PM   Specimen: Nasopharyngeal Swab  Result Value Ref Range Status   SARS Coronavirus 2 NEGATIVE NEGATIVE Final    Comment: (NOTE) If result is NEGATIVE SARS-CoV-2 target nucleic acids are NOT DETECTED. The SARS-CoV-2 RNA is generally detectable in upper and lower  respiratory specimens during the acute phase of infection. The lowest  concentration of SARS-CoV-2 viral copies this assay can detect is 250  copies / mL. A negative result does not preclude SARS-CoV-2 infection  and should not be used as the sole basis for treatment or other  patient management decisions.  A negative result may occur with  improper specimen collection / handling, submission of specimen other  than nasopharyngeal swab, presence of viral mutation(s) within the  areas targeted by this assay, and inadequate number of viral copies  (<250 copies / mL). A negative result must be combined with clinical  observations, patient history, and epidemiological information. If result is POSITIVE SARS-CoV-2 target nucleic acids are DETECTED. The SARS-CoV-2 RNA is generally detectable in upper and lower  respiratory specimens dur ing the acute phase of infection.  Positive  results are indicative of active infection with SARS-CoV-2.  Clinical  correlation with patient history and other diagnostic information is  necessary to determine patient infection status.  Positive results do  not rule out bacterial infection or co-infection with other viruses. If result is PRESUMPTIVE POSTIVE SARS-CoV-2 nucleic acids MAY BE PRESENT.   A presumptive positive result was obtained on the submitted specimen  and confirmed on repeat testing.  While 2019 novel coronavirus  (SARS-CoV-2) nucleic acids may be present in the submitted sample  additional confirmatory testing may be necessary for epidemiological  and  / or clinical management purposes  to differentiate between  SARS-CoV-2 and other Sarbecovirus currently known to infect humans.  If clinically indicated additional testing with an alternate test  methodology [phone]) is advised. The SARS-CoV-2 RNA is generally  detectable in upper and lower respiratory sp ecimens during the acute  phase of infection. The expected result is Negative. Fact  Sheet for Patients:  StrictlyIdeas.no Fact Sheet for Healthcare Providers: BankingDealers.co.za This test is not yet approved or cleared by the [place] FDA and has been authorized for detection and/or diagnosis of SARS-CoV-2 by FDA under an Emergency Use Authorization (EUA).  This EUA will remain in effect (meaning this test can be used) for the duration of the COVID-19 declaration under Section 564(b)(1) of the Act, 21 U.S.C. section 360bbb-3(b)(1), unless the authorization is terminated or revoked sooner. Performed at [facility], [place] [address] [place], [place] [address]      Labs: BNP (last 3 results) No results for input(s): BNP in the last 8760 hours. Basic Metabolic Panel: Recent Labs  Lab 08/26/19 1123 08/27/19 0309  NA 138 138  K 3.1* 3.0*  CL 94* 96*  CO2 30 29  GLUCOSE 141* 101*  BUN 9 15  CREATININE 7.04* 8.89*  CALCIUM 9.4 9.2   Liver Function Tests: Recent Labs  Lab 08/26/19 1123  AST 19  ALT 8  ALKPHOS 60  BILITOT 0.6  PROT 6.3*  ALBUMIN 3.1*   No results for input(s): LIPASE, AMYLASE in the last 168 hours. No results for input(s): AMMONIA in the last 168 hours. CBC: Recent Labs  Lab 08/26/19 1123 08/27/19 0309  WBC 5.7 6.6  NEUTROABS 3.6  --   HGB 6.3* 7.2*  HCT 20.0* 22.3*  MCV 97.1 94.1  PLT 358 380   Cardiac Enzymes: No results for input(s): CKTOTAL, CKMB, CKMBINDEX, TROPONINI in the last 168 hours. BNP: Invalid input(s): POCBNP CBG: No results for input(s): GLUCAP in the last 168  hours. D-Dimer No results for input(s): DDIMER in the last 72 hours. Hgb A1c No results for input(s): HGBA1C in the last 72 hours. Lipid Profile No results for input(s): CHOL, HDL, LDLCALC, TRIG, CHOLHDL, LDLDIRECT in the last 72 hours. Thyroid function studies No results for input(s): TSH, T4TOTAL, T3FREE, THYROIDAB in the last 72 hours.  Invalid input(s): FREET3 Anemia work up No results for input(s): VITAMINB12, FOLATE, FERRITIN, TIBC, IRON, RETICCTPCT in the last 72 hours. Urinalysis    Component Value Date/Time   COLORURINE YELLOW 08/08/2019 1440   APPEARANCEUR HAZY (A) 08/08/2019 1440   LABSPEC 1.012 08/08/2019 1440   PHURINE 5.0 08/08/2019 1440   GLUCOSEU NEGATIVE 08/08/2019 1440   HGBUR NEGATIVE 08/08/2019 1440   [place] 08/08/2019 1440   KETONESUR NEGATIVE 08/08/2019 1440   PROTEINUR >=300 (A) 08/08/2019 1440   NITRITE NEGATIVE 08/08/2019 1440   LEUKOCYTESUR NEGATIVE 08/08/2019 1440   Sepsis Labs Invalid input(s): PROCALCITONIN,  WBC,  LACTICIDVEN Microbiology Recent Results (from the past 240 hour(s))  Blood culture (routine x 2)     Status: None   Collection Time: 08/18/19  8:15 PM   Specimen: BLOOD RIGHT ARM  Result Value Ref Range Status   Specimen Description BLOOD RIGHT ARM  Final   Special Requests   Final    BOTTLES DRAWN AEROBIC AND ANAEROBIC Blood Culture results may not be optimal due to an excessive volume of blood received in culture bottles   Culture   Final    NO GROWTH 5 DAYS Performed at [facility], [place] [address] [place], [place] [address]    Report Status 08/23/2019 FINAL  Final  Blood culture (routine x 2)     Status: None   Collection Time: 08/18/19  8:22 PM   Specimen: BLOOD RIGHT HAND  Result Value Ref Range Status   Specimen Description BLOOD RIGHT HAND  Final  Special Requests   Final    BOTTLES DRAWN AEROBIC ONLY Blood Culture results may not be optimal due to an excessive volume of blood received in culture  bottles   Culture   Final    NO GROWTH 5 DAYS Performed at [facility], [place] [address], [place], [place] [address]    Report Status 08/23/2019 FINAL  Final  SARS Coronavirus 2 [facility] order, Performed in [facility] hospital lab) Nasopharyngeal Nasopharyngeal Swab     Status: None   Collection Time: 08/19/19  2:40 PM   Specimen: Nasopharyngeal Swab  Result Value Ref Range Status   SARS Coronavirus 2 NEGATIVE NEGATIVE Final    Comment: (NOTE) If result is NEGATIVE SARS-CoV-2 target nucleic acids are NOT DETECTED. The SARS-CoV-2 RNA is generally detectable in upper and lower  respiratory specimens during the acute phase of infection. The lowest  concentration of SARS-CoV-2 viral copies this assay can detect is 250  copies / mL. A negative result does not preclude SARS-CoV-2 infection  and should not be used as the sole basis for treatment or other  patient management decisions.  A negative result may occur with  improper specimen collection / handling, submission of specimen other  than nasopharyngeal swab, presence of viral mutation(s) within the  areas targeted by this assay, and inadequate number of viral copies  (<250 copies / mL). A negative result must be combined with clinical  observations, patient history, and epidemiological information. If result is POSITIVE SARS-CoV-2 target nucleic acids are DETECTED. The SARS-CoV-2 RNA is generally detectable in upper and lower  respiratory specimens dur ing the acute phase of infection.  Positive  results are indicative of active infection with SARS-CoV-2.  Clinical  correlation with patient history and other diagnostic information is  necessary to determine patient infection status.  Positive results do  not rule out bacterial infection or co-infection with other viruses. If result is PRESUMPTIVE POSTIVE SARS-CoV-2 nucleic acids MAY BE PRESENT.   A presumptive positive result was obtained on the submitted specimen  and  confirmed on repeat testing.  While 2019 novel coronavirus  (SARS-CoV-2) nucleic acids may be present in the submitted sample  additional confirmatory testing may be necessary for epidemiological  and / or clinical management purposes  to differentiate between  SARS-CoV-2 and other Sarbecovirus currently known to infect humans.  If clinically indicated additional testing with an alternate test  methodology [phone]) is advised. The SARS-CoV-2 RNA is generally  detectable in upper and lower respiratory sp ecimens during the acute  phase of infection. The expected result is Negative. Fact Sheet for Patients:  StrictlyIdeas.no Fact Sheet for Healthcare Providers: BankingDealers.co.za This test is not yet approved or cleared by the [place] FDA and has been authorized for detection and/or diagnosis of SARS-CoV-2 by FDA under an Emergency Use Authorization (EUA).  This EUA will remain in effect (meaning this test can be used) for the duration of the COVID-19 declaration under Section 564(b)(1) of the Act, 21 U.S.C. section 360bbb-3(b)(1), unless the authorization is terminated or revoked sooner. Performed at [facility], [place] [address] [place],  [address]   MRSA PCR Screening     Status: None   Collection Time: 08/20/19  9:10 AM   Specimen: Nasopharyngeal  Result Value Ref Range Status   MRSA by PCR NEGATIVE NEGATIVE Final    Comment:        The GeneXpert MRSA Assay (FDA approved for NASAL specimens only), is one component of  a comprehensive MRSA colonization surveillance program. It is not intended to diagnose MRSA infection nor to guide or monitor treatment for MRSA infections. Performed at [facility], [place] [address], [place], [place] [address]   SARS Coronavirus 2 [facility] order, Performed in [facility] hospital lab) Nasopharyngeal Nasopharyngeal Swab     Status: None   Collection Time: 08/26/19  2:18  PM   Specimen: Nasopharyngeal Swab  Result Value Ref Range Status   SARS Coronavirus 2 NEGATIVE NEGATIVE Final    Comment: (NOTE) If result is NEGATIVE SARS-CoV-2 target nucleic acids are NOT DETECTED. The SARS-CoV-2 RNA is generally detectable in upper and lower  respiratory specimens during the acute phase of infection. The lowest  concentration of SARS-CoV-2 viral copies this assay can detect is 250  copies / mL. A negative result does not preclude SARS-CoV-2 infection  and should not be used as the sole basis for treatment or other  patient management decisions.  A negative result may occur with  improper specimen collection / handling, submission of specimen other  than nasopharyngeal swab, presence of viral mutation(s) within the  areas targeted by this assay, and inadequate number of viral copies  (<250 copies / mL). A negative result must be combined with clinical  observations, patient history, and epidemiological information. If result is POSITIVE SARS-CoV-2 target nucleic acids are DETECTED. The SARS-CoV-2 RNA is generally detectable in upper and lower  respiratory specimens dur ing the acute phase of infection.  Positive  results are indicative of active infection with SARS-CoV-2.  Clinical  correlation with patient history and other diagnostic information is  necessary to determine patient infection status.  Positive results do  not rule out bacterial infection or co-infection with other viruses. If result is PRESUMPTIVE POSTIVE SARS-CoV-2 nucleic acids MAY BE PRESENT.   A presumptive positive result was obtained on the submitted specimen  and confirmed on repeat testing.  While 2019 novel coronavirus  (SARS-CoV-2) nucleic acids may be present in the submitted sample  additional confirmatory testing may be necessary for epidemiological  and / or clinical management purposes  to differentiate between  SARS-CoV-2 and other Sarbecovirus currently known to infect humans.   If clinically indicated additional testing with an alternate test  methodology [phone]) is advised. The SARS-CoV-2 RNA is generally  detectable in upper and lower respiratory sp ecimens during the acute  phase of infection. The expected result is Negative. Fact Sheet for Patients:  StrictlyIdeas.no Fact Sheet for Healthcare Providers: BankingDealers.co.za This test is not yet approved or cleared by the [place] FDA and has been authorized for detection and/or diagnosis of SARS-CoV-2 by FDA under an Emergency Use Authorization (EUA).  This EUA will remain in effect (meaning this test can be used) for the duration of the COVID-19 declaration under Section 564(b)(1) of the Act, 21 U.S.C. section 360bbb-3(b)(1), unless the authorization is terminated or revoked sooner. Performed at [facility], [place] [address], [place], [place] [address]      Time coordinating discharge: 45 minutes  SIGNED:   Tawni Millers, MD  [facility] 08/27/2019, 12:36 PM

## 2019-08-27 NOTE — Anesthesia Preprocedure Evaluation (Addendum)
Anesthesia Evaluation  Patient identified by MRN, date of birth, ID band Patient awake    Reviewed: Allergy & Precautions, NPO status , Patient's Chart, lab work & pertinent test results  Airway Mallampati: III  TM Distance: >3 FB Neck ROM: Full    Dental no notable dental hx.    Pulmonary former smoker,    Pulmonary exam normal breath sounds clear to auscultation       Cardiovascular hypertension, Pt. on medications and Pt. on home beta blockers Normal cardiovascular exam Rhythm:Regular Rate:Normal  ECG: ST  ECHO: 1. The left ventricle has hyperdynamic systolic function, with an ejection fraction of >65%. The cavity size was decreased. There is severe concentric left ventricular hypertrophy. Left ventricular diastolic parameters were normal. No evidence of left  ventricular regional wall motion abnormalities.  2. The right ventricle has normal systolic function. The cavity was normal. There is no increase in right ventricular wall thickness. Right ventricular systolic pressure could not be assessed.  3. Small pericardial effusion.  4. No evidence of mitral valve stenosis.  5. The aortic valve is tricuspid. No stenosis of the aortic valve.   Neuro/Psych Cerebral thrombosis with cerebral infarction PRES (posterior reversible encephalopathy syndrome)   CVA negative psych ROS   GI/Hepatic negative GI ROS, (+)     substance abuse  cocaine use and marijuana use,   Endo/Other  negative endocrine ROS  Renal/GU ESRF and DialysisRenal disease     Musculoskeletal negative musculoskeletal ROS (+)   Abdominal   Peds  Hematology  (+) anemia , HLD   Anesthesia Other Findings EGD with propofol  Reproductive/Obstetrics                            Anesthesia Physical Anesthesia Plan  ASA: IV  Anesthesia Plan: MAC   Post-op Pain Management:    Induction: Intravenous  PONV Risk Score and Plan: 1  and Propofol infusion and Treatment may vary due to age or medical condition  Airway Management Planned: Nasal Cannula  Additional Equipment:   Intra-op Plan:   Post-operative Plan:   Informed Consent: I have reviewed the patients History and Physical, chart, labs and discussed the procedure including the risks, benefits and alternatives for the proposed anesthesia with the patient or authorized representative who has indicated his/her understanding and acceptance.     Dental advisory given  Plan Discussed with: CRNA  Anesthesia Plan Comments:        Anesthesia Quick Evaluation

## 2019-08-27 NOTE — Consult Note (Signed)
[facility] Renal Consultation Note    Indication for Consultation:  Management of ESRD/hemodialysis; anemia, hypertension/volume and secondary hyperparathyroidism PCP: none  HPI: William [name] is a 33 y.o. male with ESRD secondary to HTN who started HD 08/09/19. PMHx is significant for prior CVA 06/2019 , HLD, polysubstance abuse and multiple admissions since July. Since starting dialysis in August, he had an Globe placed 9/1 then returned 9/4 with arm swelling, fever and chills, but no positive cultures - he was d/c on  5 more doses of Vanc and Fortaz (Zosyn not avail at outpatient unit- has received 3/5).  During his previous admission he was given q unit PRBC for hgb 6.4.  He returned to dialysis 9/7 but hgb was not checked until 9/9 at which time it was 6.3 repeat tsat 9/2 10% even after Fe repletion but before last transfusion.   He was advised to come to the ED for evaluation due to low hgb at his outpatient unit. . Evaluation in the ED confirmed low hgb 6.3 - heme positive. BUN 9 Cr 7 K 3.1 9/12 with last HD 9/11. He is afebrile . GI was consulted -he has none of the usual GI sx N, V, abdominal pain, reflux, NSAID use. GI rec IV PPI with EGD today.  BP today back to baseline high.  Review of last several d/c records show some variability in BP meds - he does not think he is taking midoxidil 5 bid that was increased from 2.5 bid on the last d/c summary due to high BP.  He has some mild SOB, no cough no CP, no LE edema, no frank BRBPR, no problems with dialysis other than arm swelling-.   Past Medical History:  Diagnosis Date  . Anemia   . Drug abuse, cocaine type ([place]) 06/23/2019  . ESRD (end stage renal disease) ([place]) 08/08/2019  . Hyperlipidemia   . Hypertension   . Stroke [facility]) 06/2019   Past Surgical History:  Procedure Laterality Date  . AV FISTULA PLACEMENT Right 08/15/2019   Procedure: INSERTION OF ARTERIOVENOUS (AV) GORE-TEX GRAFT ARM. Using 4-47mm STRETCH Goretex.;   Surgeon: Angelia Mould, MD;  Location: [facility] OR;  Service: Vascular;  Laterality: Right;  . INSERTION OF DIALYSIS CATHETER Right 08/08/2019   Procedure: INSERTION OF right internal jugular DIALYSIS CATHETER;  Surgeon: Angelia Mould, MD;  Location: [place];  Service: Vascular;  Laterality: Right;  . RADIOLOGY WITH ANESTHESIA N/A 06/23/2019   Procedure: MRI OF VESSELS;  Surgeon: Radiologist, Medication, MD;  Location: [place];  Service: Radiology;  Laterality: N/A;   Family History  Problem Relation Age of Onset  . Diabetes Sister   . Diabetes Maternal Uncle   . Hypertension Father   . Hypertension Paternal Grandfather   . Prostate cancer Paternal Grandfather   . Diabetes Sister   . Renal Disease Neg Hx    Social History:  reports that he quit smoking about 2 months ago. He smoked 0.00 packs per day for 20.00 years. He has never used smokeless tobacco. He reports previous alcohol use. He reports current drug use. Drugs: Marijuana, Cocaine, and Benzodiazepines. Allergies  Allergen Reactions  . Reglan [Metoclopramide] Nausea And Vomiting    Develops akathisia   Prior to Admission medications   Medication Sig Start Date End Date Taking? Authorizing Provider  amLODipine (NORVASC) 10 MG tablet Take 1 tablet (10 mg total) by mouth at bedtime. 07/08/19  Yes Debbe Odea, MD  atorvastatin (LIPITOR) 40 MG tablet Take 1 tablet (  40 mg total) by mouth daily at 6 PM. 06/29/19  Yes Adhikari, Tamsen Meek, MD  carvedilol (COREG) 25 MG tablet Take 1 tablet (25 mg total) by mouth 2 (two) times daily with a meal. 06/29/19  Yes Adhikari, Amrit, MD  chlorproMAZINE (THORAZINE) 10 MG tablet Take 1 tablet (10 mg total) by mouth 3 (three) times daily as needed for hiccoughs. 08/10/19  Yes Shelly Coss, MD  folic acid-vitamin b complex-vitamin c-selenium-zinc (DIALYVITE) 3 MG TABS tablet Take 1 tablet by mouth daily.   Yes [provider]  hydrALAZINE (APRESOLINE) 100 MG tablet Take 1 tablet (100 mg  total) by mouth every 8 (eight) hours. 07/08/19  Yes Debbe Odea, MD  minoxidil (LONITEN) 2.5 MG tablet Take 2 tablets (5 mg total) by mouth 2 (two) times daily. 08/20/19  Yes Debbe Odea, MD  nicotine (NICODERM CQ - DOSED IN MG/24 HR) 7 mg/24hr patch Place 7 mg onto the skin daily.   Yes [provider]  oxyCODONE-acetaminophen (PERCOCET/ROXICET) 5-325 MG tablet Take 1 tablet by mouth every 4 (four) hours as needed for moderate pain. 08/20/19  Yes Debbe Odea, MD  sucroferric oxyhydroxide (VELPHORO) 500 MG chewable tablet Chew 500 mg by mouth 3 (three) times daily with meals. 08/21/19  Yes [provider]  aspirin 81 MG chewable tablet Chew 1 tablet (81 mg total) by mouth daily. Patient taking differently: Chew 81 mg by mouth as needed for mild pain.  06/30/19   Shelly Coss, MD  clopidogrel (PLAVIX) 75 MG tablet Take 75 mg by mouth daily. 08/11/19   [provider]  spironolactone (ALDACTONE) 25 MG tablet Take 25 mg by mouth daily.    [provider]   Current Facility-Administered Medications  Medication Dose Route Frequency Provider Last Rate Last Dose  . 0.9 %  sodium chloride infusion  250 mL Intravenous PRN Merton Border, MD      . 0.9 %  sodium chloride infusion   Intravenous Continuous Ronnette Juniper, MD 20 mL/hr at 08/26/19 2122    . acetaminophen (TYLENOL) tablet 650 mg  650 mg Oral Q6H PRN Merton Border, MD       Or  . acetaminophen (TYLENOL) suppository 650 mg  650 mg Rectal Q6H PRN Merton Border, MD      . atorvastatin (LIPITOR) tablet 40 mg  40 mg Oral q1800 Merton Border, MD   40 mg at 08/26/19 1606  . carvedilol (COREG) tablet 25 mg  25 mg Oral BID WC Merton Border, MD   25 mg at 08/27/19 0918  . chlorproMAZINE (THORAZINE) tablet 10 mg  10 mg Oral TID PRN Merton Border, MD      . hydrALAZINE (APRESOLINE) tablet 100 mg  100 mg Oral Q8H Merton Border, MD   100 mg at 08/27/19 [id number]  . HYDROcodone-acetaminophen (NORCO/VICODIN) 5-325 MG per tablet 1-2 tablet  1-2  tablet Oral Q4H PRN Merton Border, MD   2 tablet at 08/27/19 0918  . influenza vac split quadrivalent PF (FLUARIX) injection 0.5 mL  0.5 mL Intramuscular Tomorrow-1000 Merton Border, MD      . minoxidil (LONITEN) tablet 5 mg  5 mg Oral BID Merton Border, MD   5 mg at 08/26/19 2119  . morphine 2 MG/ML injection 2 mg  2 mg Intravenous Q4H PRN Merton Border, MD      . multivitamin (RENA-VIT) tablet 1 tablet  1 tablet Oral QHS Merton Border, MD   1 tablet at 08/26/19 2120  . nicotine (NICODERM CQ - dosed in mg/24  hr) patch 7 mg  7 mg Transdermal Daily Merton Border, MD   7 mg at 08/27/19 0900  . ondansetron (ZOFRAN) tablet 4 mg  4 mg Oral Q6H PRN Merton Border, MD       Or  . ondansetron (ZOFRAN) injection 4 mg  4 mg Intravenous Q6H PRN Merton Border, MD      . pantoprazole (PROTONIX) 80 mg in sodium chloride 0.9 % 250 mL (0.32 mg/mL) infusion  8 mg/hr Intravenous Continuous Merton Border, MD 25 mL/hr at 08/27/19 0358 8 mg/hr at 08/27/19 0358  . pneumococcal 23 valent vaccine (PNU-IMMUNE) injection 0.5 mL  0.5 mL Intramuscular Tomorrow-1000 Hijazi, Deatra Canter, MD      . sodium chloride flush (NS) 0.9 % injection 3 mL  3 mL Intravenous Q12H Merton Border, MD   3 mL at 08/27/19 0907  . sodium chloride flush (NS) 0.9 % injection 3 mL  3 mL Intravenous PRN Merton Border, MD      . sucroferric oxyhydroxide (VELPHORO) chewable tablet 500 mg  500 mg Oral TID WC Merton Border, MD   500 mg at 08/26/19 1608   Labs: Basic Metabolic Panel: Recent Labs  Lab 08/26/19 1123 08/27/19 0309  NA 138 138  K 3.1* 3.0*  CL 94* 96*  CO2 30 29  GLUCOSE 141* 101*  BUN 9 15  CREATININE 7.04* 8.89*  CALCIUM 9.4 9.2   Liver Function Tests: Recent Labs  Lab 08/26/19 1123  AST 19  ALT 8  ALKPHOS 60  BILITOT 0.6  PROT 6.3*  ALBUMIN 3.1*   CBC: Recent Labs  Lab 08/26/19 1123 08/27/19 0309  WBC 5.7 6.6  NEUTROABS 3.6  --   HGB 6.3* 7.2*  HCT 20.0* 22.3*  MCV 97.1 94.1  PLT 358 380   ROS: As per HPI otherwise negative.  Physical  Exam: Vitals:   08/26/19 1821 08/26/19 2042 08/27/19 0606 08/27/19 0738  BP: 127/74 (!) 117/56 (!) 179/104 (!) 177/105  Pulse:  82 98 (!) 103  Resp:  17 19 20   Temp:  98.3 F (36.8 C) 98.6 F (37 C) 98.2 F (36.8 C)  TempSrc:  Oral Oral Oral  SpO2:  100% 99% 94%  Weight:      Height:         General: WDWN NAD Head: NCAT sclera not icteric MMM Neck: Supple. Neck veins full Lungs: CTA bilaterally without wheezes, rales, or rhonchi. Breathing is unlabored but dim overall Heart: RRR with S1 S2.  Abdomen: soft NT + BS Lower extremities:without edema or ischemic changes, no open wounds  Neuro: A & O  X 3. Moves all extremities spontaneously. Psych:  Responds to questions appropriately with a normal affect. Dialysis Access: right AVGG placed 9/1 mild RU arm edema, dense forearm edema + bruit - right IJ TDC  Dialysis Orders:  MWF [facility] 4.25 hr EDW 79.5 2K 2 Ca 400/800 heparin 3200 TDC and Aranesp 60 given 9/9 s/p 400 venofer - but tsat only 10% 9/2 iPTH 701 - on 4 Hectorol  BP meds: amlodipine 10, hydral 100 tid, midoxidil 2.5 vs 5 bid coreg 25 bid Binders: velphoro 500 ac tid Last HD net UF only 0.5 - due to minimal IDWG - he attained EDW   Assessment/Plan: 1.  Anemia with heme + stools - Fe deficient - s/p 1 unit PRBC 9/12 - GI following for endo today. 2.  ESRD -  MWF - HD Monday hold heparin  3.  Hypertension/volume  - his lowest SBP on  HD ~150 - on multiple meds including midoxidil- some variability in doses the past few admissions- plan resume amlodipine - midoxidil 2.5 bid increased from 2.5 bid to 5 mg bid  last admission but  he did not make this change as best as I can tell.  I believe he can tolerate more UF and favor limiting midoxidil to 2.5 bid due to the propensity of ESRD patients to develop pericardial effusions with long term use.  Will resume home amlodipine - at HS/reduce midoxidil and - lower volume as tolerated tomorrow and continue to titrate - if BP doesn't then come  down can increase midoxidil further 4.  Anemia  With Fe deficiency - on ESA -  5.  Metabolic bone disease -  continue Hectorol /binders - unfortunately on Fe based binder which makes stools black 6.  Nutrition - npo for now - advance as able 7.    Recent d/c 9.4 with AVGG infection vs gortex reaction - on 2 weeks Vanc and Fortaz - d/c summary said Zosyn but not available at dialysis unit so Tressie Ellis being given - no positive cultures - last antibiotics given at outpatient HD were 750 mg IV Vanc and 2 gm ceftazidime - due 2 more doses.- doesn't appear as overtly infected, though likely quite uncomfortable due to swelling - complete antibiotics - will order 2 additional doses of Vanc and Fortaz 9.     Polysubstance abuse 10.    Hx CVA  11.   Flu vaccine/pneumococcal 23 given 9/13 - need to notify home HD unit at d/c  Myriam Jacobson, PA-C [facility] [phone] 08/27/2019, 10:38 AM

## 2019-08-27 NOTE — Brief Op Note (Signed)
08/26/2019 - 08/27/2019  12:31 PM  PATIENT:  Cecille Amsterdam  33 y.o. male  PRE-OPERATIVE DIAGNOSIS:  EGD with propofol  POST-OPERATIVE DIAGNOSIS:  duodenal biopsy, antral biopsy, otherwise unremarkable exam  PROCEDURE:  Procedure(s): ESOPHAGOGASTRODUODENOSCOPY (EGD) WITH PROPOFOL (N/A) BIOPSY  SURGEON:  Surgeon(s) and Role:    Ronnette Juniper, MD - Primary  PHYSICIAN ASSISTANT:   ASSISTANTS:Tori Hardin Negus, RN, Laverda Sorenson, Tech  ANESTHESIA:   MAC  EBL:  Minimal  BLOOD ADMINISTERED:none  DRAINS: none   LOCAL MEDICATIONS USED:  NONE  SPECIMEN:  Biopsy / Limited Resection  DISPOSITION OF SPECIMEN:  PATHOLOGY  COUNTS:  YES  TOURNIQUET:  * No tourniquets in log *  DICTATION: .Dragon Dictation  PLAN OF CARE: Admit to inpatient   PATIENT DISPOSITION:  PACU - hemodynamically stable.   Delay start of Pharmacological VTE agent (>24hrs) due to surgical blood loss or risk of bleeding: no

## 2019-08-27 NOTE — Progress Notes (Signed)
Paitent educated on discharge instructions. Patient declined vaccines because he had taken them at dialysis. Patient taken by wheelchair to patient pickup where his wife picked him up.

## 2019-08-27 NOTE — Interval H&P Note (Signed)
History and Physical Interval Note: 32/male with ESRD of HD with anemia and black stools for an EGD today.  08/27/2019 12:00 PM  William [name]  has presented today for EGD, with the diagnosis of  anemia and black stools.  The various methods of treatment have been discussed with the patient and family. After consideration of risks, benefits and other options for treatment, the patient has consented to  Procedure(s): ESOPHAGOGASTRODUODENOSCOPY (EGD) WITH PROPOFOL (N/A) as a surgical intervention.  The patient's history has been reviewed, patient examined, no change in status, stable for surgery.  I have reviewed the patient's chart and labs.  Questions were answered to the patient's satisfaction.     Ronnette Juniper

## 2019-08-28 ENCOUNTER — Ambulatory Visit: Payer: Medicaid Other | Admitting: Speech Pathology

## 2019-08-28 ENCOUNTER — Ambulatory Visit: Payer: Medicaid Other | Admitting: Physical Therapy

## 2019-08-29 ENCOUNTER — Encounter (HOSPITAL_COMMUNITY): Payer: Self-pay | Admitting: Gastroenterology

## 2019-08-30 ENCOUNTER — Ambulatory Visit: Payer: Medicaid Other | Admitting: Speech Pathology

## 2019-08-30 ENCOUNTER — Ambulatory Visit: Payer: Medicaid Other

## 2019-08-30 NOTE — Progress Notes (Deleted)
  POST OPERATIVE OFFICE NOTE    CC:  F/u for surgery  HPI:  This is a 33 y.o. male who is s/p edema RUE after placement of R forearm  loop graft placement9/12/2018 .  He is here for follow up visit.  He has been using compression and elevation.  He States he has decreased swelling and the pain is more tolerable.  He takes 1 pain tablet every 6-8 hours.  He is also receiving IV antibiotics after HD.   He was last seen in our office on 08/22/2019. He reports improvement in the edema and pain.     Allergies  Allergen Reactions  . Reglan [Metoclopramide] Nausea And Vomiting    Develops akathisia    Current Outpatient Medications  Medication Sig Dispense Refill  . amLODipine (NORVASC) 10 MG tablet Take 1 tablet (10 mg total) by mouth at bedtime. 30 tablet 0  . atorvastatin (LIPITOR) 40 MG tablet Take 1 tablet (40 mg total) by mouth daily at 6 PM.    . carvedilol (COREG) 25 MG tablet Take 1 tablet (25 mg total) by mouth 2 (two) times daily with a meal.    . chlorproMAZINE (THORAZINE) 10 MG tablet Take 1 tablet (10 mg total) by mouth 3 (three) times daily as needed for hiccoughs. 20 tablet 0  . clopidogrel (PLAVIX) 75 MG tablet Take 75 mg by mouth daily.    . folic acid-vitamin b complex-vitamin c-selenium-zinc (DIALYVITE) 3 MG TABS tablet Take 1 tablet by mouth daily.    . hydrALAZINE (APRESOLINE) 100 MG tablet Take 1 tablet (100 mg total) by mouth every 8 (eight) hours. 30 tablet 3  . minoxidil (LONITEN) 2.5 MG tablet Take 2 tablets (5 mg total) by mouth 2 (two) times daily. 60 tablet 0  . nicotine (NICODERM CQ - DOSED IN MG/24 HR) 7 mg/24hr patch Place 7 mg onto the skin daily.    Marland Kitchen oxyCODONE-acetaminophen (PERCOCET) 5-325 MG tablet Take 1 tablet by mouth every 6 (six) hours as needed for severe pain. 10 tablet 0  . pantoprazole (PROTONIX) 40 MG tablet Take 1 tablet (40 mg total) by mouth daily. 30 tablet 0  . spironolactone (ALDACTONE) 25 MG tablet Take 25 mg by mouth daily.    . sucroferric  oxyhydroxide (VELPHORO) 500 MG chewable tablet Chew 500 mg by mouth 3 (three) times daily with meals.     No current facility-administered medications for this visit.      ROS:  See HPI  Physical Exam:  ***  Incision:  *** Extremities:  *** Neuro: *** Abdomen:  ***  Assessment/Plan:  This is a 33 y.o. male who is s/p: ***  -***   Leontine Locket, PA-C [facility] [phone]  Clinic MD:  ***

## 2019-09-04 ENCOUNTER — Ambulatory Visit: Payer: Medicaid Other | Admitting: Speech Pathology

## 2019-09-04 ENCOUNTER — Ambulatory Visit: Payer: Medicaid Other | Admitting: Physical Therapy

## 2019-09-06 ENCOUNTER — Ambulatory Visit: Payer: Medicaid Other | Admitting: Speech Pathology

## 2019-09-07 ENCOUNTER — Ambulatory Visit (INDEPENDENT_AMBULATORY_CARE_PROVIDER_SITE_OTHER): Payer: Medicaid Other | Admitting: General Practice

## 2019-09-07 ENCOUNTER — Encounter: Payer: Self-pay | Admitting: General Practice

## 2019-09-07 ENCOUNTER — Other Ambulatory Visit: Payer: Self-pay

## 2019-09-07 VITALS — BP 168/101 | HR 87 | Temp 97.5°F | Ht 69.0 in | Wt 171.6 lb

## 2019-09-07 DIAGNOSIS — I10 Essential (primary) hypertension: Secondary | ICD-10-CM | POA: Diagnosis not present

## 2019-09-07 DIAGNOSIS — E7800 Pure hypercholesterolemia, unspecified: Secondary | ICD-10-CM | POA: Diagnosis not present

## 2019-09-07 DIAGNOSIS — Z79899 Other long term (current) drug therapy: Secondary | ICD-10-CM | POA: Diagnosis not present

## 2019-09-07 MED ORDER — HYDRALAZINE HCL 100 MG PO TABS: 100.0000 mg | ORAL_TABLET | Freq: Three times a day (TID) | ORAL | 3 refills | Status: AC

## 2019-09-07 NOTE — Patient Instructions (Addendum)
Medication Instructions:  INCREASE Minoxidil to 5mg  Take 1 tablet twice a day  TAKE Hydralazine with meals three times a day  BRING ALL OF YOUR PILL BOTTLES WITH YOU TO YOUR NEXT APPOINTMENT If you need a refill on your cardiac medications before your next appointment, please call your pharmacy.   Lab work: None  If you have labs (blood work) drawn today and your tests are completely normal, you will receive your results only by: Marland Kitchen MyChart Message (if you have MyChart) OR . A paper copy in the mail If you have any lab test that is abnormal or we need to change your treatment, we will call you to review the results.  Testing/Procedures: None   Follow-Up: At [facility], you and your health needs are our priority.  As part of our continuing mission to provide you with exceptional heart care, we have created designated Provider Care Teams.  These Care Teams include your primary Cardiologist (physician) and Advanced Practice Providers (APPs -  Physician Assistants and Nurse Practitioners) who all work together to provide you with the care you need, when you need it. . Your physician recommends that you schedule a follow-up appointment in: 2 weeks with Coletta Memos, NP  Any Other Special Instructions Will Be Listed Below (If Applicable). Keep a blood pressure log, check your blood 3-4 times a week  Continue eating a renal diet

## 2019-09-07 NOTE — Progress Notes (Signed)
Cardiology Clinic Note   Patient Name: William [name] Date of Encounter: 09/07/2019  Primary Care Provider:  Patient, No Pcp Per Primary Cardiologist:  Minus Breeding, MD  Patient Profile    William [name] 33 year old male presents today for follow-up of his hypertension.  Past Medical History    Past Medical History:  Diagnosis Date  . Anemia   . Drug abuse, cocaine type ([place]) 06/23/2019  . ESRD (end stage renal disease) ([place]) 08/08/2019  . Hyperlipidemia   . Hypertension   . Stroke [facility]) 06/2019   Past Surgical History:  Procedure Laterality Date  . AV FISTULA PLACEMENT Right 08/15/2019   Procedure: INSERTION OF ARTERIOVENOUS (AV) GORE-TEX GRAFT ARM. Using 4-9mm STRETCH Goretex.;  Surgeon: Angelia Mould, MD;  Location: [facility] OR;  Service: Vascular;  Laterality: Right;  . BIOPSY  08/27/2019   Procedure: BIOPSY;  Surgeon: Ronnette Juniper, MD;  Location: [facility] ENDOSCOPY;  Service: Gastroenterology;;  . ESOPHAGOGASTRODUODENOSCOPY (EGD) WITH PROPOFOL N/A 08/27/2019   Procedure: ESOPHAGOGASTRODUODENOSCOPY (EGD) WITH PROPOFOL;  Surgeon: Ronnette Juniper, MD;  Location: [place];  Service: Gastroenterology;  Laterality: N/A;  . INSERTION OF DIALYSIS CATHETER Right 08/08/2019   Procedure: INSERTION OF right internal jugular DIALYSIS CATHETER;  Surgeon: Angelia Mould, MD;  Location: [place];  Service: Vascular;  Laterality: Right;  . RADIOLOGY WITH ANESTHESIA N/A 06/23/2019   Procedure: MRI OF VESSELS;  Surgeon: Radiologist, Medication, MD;  Location: [place];  Service: Radiology;  Laterality: N/A;    Allergies  Allergies  Allergen Reactions  . Reglan [Metoclopramide] Nausea And Vomiting    Develops akathisia    History of Present Illness   Mr. Edge was last seen by Dr. Percival Spanish on 06/23/2019.  He was asked to be seen due to elevated troponins by critical care medicine.  His elevated troponins were likely due to demand ischemia with tachycardia and severe LVH in the  presence of hypertensive urgency.  It was recommended that he avoid beta blockers due to his cocaine history.  At that time his MRI showed no evidence of endocarditis or embolus.  He presents to the clinic today for follow-up for his hypertension.  His PMH also includes ESRD on dialysis, cerebral thrombus with cerebral infarct, stroke, infection of AV graft for dialysis, anemia, hyperlipidemia, polysubstance abuse, tobacco abuse, hypokalemia, and arm swelling.  He presents the clinic today and states he has felt well.  However, his blood pressure has remained in the [id number] systolic range.  He has not had any headaches and until about 2 weeks ago he was more active walking in the mall.  He states over the last 2 weeks he has had several doctors appointments and has not had time to do extra physical activity.  With  medication reconciliation he felt he was taking minoxidil 2.5 twice daily instead of the prescribed dose 5 mg twice daily.  He now has a primary physician that he is going to see this week.  He also states that he has submitted an application for  kidney transplant and is waiting for a response.  He is following a renal diet.  He denies chest pain, increased shortness of breath, lower extremity edema, palpitations, melena, hematuria, hemoptysis, diaphoresis, weakness, presyncope, syncope, orthopnea, and PND.   Home Medications    Prior to Admission medications   Medication Sig Start Date End Date Taking? Authorizing Provider  amLODipine (NORVASC) 10 MG tablet Take 1 tablet (10 mg total) by mouth at bedtime. 07/08/19   Debbe Odea, MD  atorvastatin (LIPITOR) 40 MG tablet Take 1 tablet (40 mg total) by mouth daily at 6 PM. 06/29/19   Shelly Coss, MD  carvedilol (COREG) 25 MG tablet Take 1 tablet (25 mg total) by mouth 2 (two) times daily with a meal. 06/29/19   Shelly Coss, MD  chlorproMAZINE (THORAZINE) 10 MG tablet Take 1 tablet (10 mg total) by mouth 3 (three) times daily as needed  for hiccoughs. 08/10/19   Shelly Coss, MD  clopidogrel (PLAVIX) 75 MG tablet Take 75 mg by mouth daily. 08/11/19   [provider]  folic acid-vitamin b complex-vitamin c-selenium-zinc (DIALYVITE) 3 MG TABS tablet Take 1 tablet by mouth daily.    [provider]  hydrALAZINE (APRESOLINE) 100 MG tablet Take 1 tablet (100 mg total) by mouth every 8 (eight) hours. 07/08/19   Debbe Odea, MD  minoxidil (LONITEN) 2.5 MG tablet Take 2 tablets (5 mg total) by mouth 2 (two) times daily. 08/20/19   Debbe Odea, MD  nicotine (NICODERM CQ - DOSED IN MG/24 HR) 7 mg/24hr patch Place 7 mg onto the skin daily.    [provider]  oxyCODONE-acetaminophen (PERCOCET) 5-325 MG tablet Take 1 tablet by mouth every 6 (six) hours as needed for severe pain. 08/27/19 08/26/20  Arrien, Jimmy Picket, MD  pantoprazole (PROTONIX) 40 MG tablet Take 1 tablet (40 mg total) by mouth daily. 08/27/19 09/26/19  Arrien, Jimmy Picket, MD  spironolactone (ALDACTONE) 25 MG tablet Take 25 mg by mouth daily.    [provider]  sucroferric oxyhydroxide (VELPHORO) 500 MG chewable tablet Chew 500 mg by mouth 3 (three) times daily with meals. 08/21/19   [provider]    Family History    Family History  Problem Relation Age of Onset  . Diabetes Sister   . Diabetes Maternal Uncle   . Hypertension Father   . Hypertension Paternal Grandfather   . Prostate cancer Paternal Grandfather   . Diabetes Sister   . Renal Disease Neg Hx    He indicated that his mother is alive. He indicated that his father is alive. He indicated that all of his three sisters are alive. He indicated that his brother is alive. He indicated that his paternal grandfather is alive. He indicated that the status of his maternal uncle is unknown. He indicated that the status of his neg hx is unknown.  Social History    Social History   Socioeconomic History  . Marital status: Married    Spouse name: [name]  .  Number of children: 3  . Years of education: Not on file  . Highest education level: High school graduate  Occupational History  . Occupation: Secretary/administrator - laid off  Social Needs  . Financial resource strain: Not hard at all  . Food insecurity    Worry: Never true    Inability: Never true  . Transportation needs    Medical: No    Non-medical: No  Tobacco Use  . Smoking status: Former Smoker    Packs/day: 0.00    Years: 20.00    Pack years: 0.00    Quit date: 06/2019    Years since quitting: 0.2  . Smokeless tobacco: Never Used  Substance and Sexual Activity  . Alcohol use: Not Currently    Comment: 2-3 beers a day; stopped with 7/20 hospitalization  . Drug use: Yes    Types: Marijuana, Cocaine, Benzodiazepines    Comment: denies use since 7/20 hospitalization  . Sexual activity: Not Currently  Partners: Female  Lifestyle  . Physical activity    Days per week: 0 days    Minutes per session: 0 min  . Stress: Not at all  Relationships  . Social connections    Talks on phone: More than three times a week    Gets together: Never    Attends religious service: 1 to 4 times per year    Active member of club or organization: No    Attends meetings of clubs or organizations: Never    Relationship status: Married  . Intimate partner violence    Fear of current or ex partner: No    Emotionally abused: No    Physically abused: No    Forced sexual activity: No  Other Topics Concern  . Not on file  Social History Narrative  . Not on file     Review of Systems    General:  No chills, fever, night sweats or weight changes.  Cardiovascular:  No chest pain, dyspnea on exertion, edema, orthopnea, palpitations, paroxysmal nocturnal dyspnea. Dermatological: No rash, lesions/masses Respiratory: No cough, dyspnea Urologic: No hematuria, dysuria Abdominal:   No nausea, vomiting, diarrhea, bright red blood per rectum, melena, or hematemesis Neurologic:  No visual changes, wkns,  changes in mental status. All other systems reviewed and are otherwise negative except as noted above.  Physical Exam    VS:  BP (!) 168/101   Pulse 87   Temp (!) 97.5 F (36.4 C)   Ht 5\' 9"  (1.753 m)   Wt 171 lb 9.6 oz (77.8 kg)   SpO2 100%   BMI 25.34 kg/m  , BMI Body mass index is 25.34 kg/m. GEN: Well nourished, well developed, in no acute distress. HEENT: normal. Neck: Supple, no JVD, carotid bruits, or masses. Cardiac: RRR, no murmurs, rubs, or gallops. No clubbing, cyanosis, edema.  Radials/DP/PT 2+ and equal bilaterally.  Respiratory:  Respirations regular and unlabored, clear to auscultation bilaterally. GI: Soft, nontender, nondistended, BS + x 4. MS: no deformity or atrophy. Skin: warm and dry, no rash. Neuro:  Strength and sensation are intact. Psych: Normal affect.  Accessory Clinical Findings    ECG personally reviewed by me today-none today  EKG 08/21/2019 Sinus tachycardia 122 bpm  Echocardiogram 06/22/2019  1. The left ventricle has hyperdynamic systolic function, with an ejection fraction of >65%. The cavity size was decreased. There is severe concentric left ventricular hypertrophy. Left ventricular diastolic parameters were normal. No evidence of left  ventricular regional wall motion abnormalities. 2. The right ventricle has normal systolic function. The cavity was normal. There is no increase in right ventricular wall thickness. Right ventricular systolic pressure could not be assessed. 3. Small pericardial effusion. 4. No evidence of mitral valve stenosis. 5. The aortic valve is tricuspid. No stenosis of the aortic valve.  Assessment & Plan   1.  Essential hypertension-BP today 168/101.  In the hospital on 06/2019 treated for hypertensive urgency.  Patient had not taken afternoon BP medications.  Dialysis Monday Wednesday and Friday. Continue amlodipine 10 mg tablet daily Continue carvedilol 25 mg tablet twice daily Change hydralazine 100 mg  tablet to 3 times daily (breakfast lunch dinner) Increase minoxidil to 5 mg tablet twice daily Continue spironolactone 25 mg tablet daily Renal diet Increase physical activity as tolerated Keep scheduled dialysis appointments Bring medication bottles and blood pressure log to next visit. Medication plan reviewed with  [facility] heart care pharmacist.  2.  Hyperlipidemia-06/23/2019: Cholesterol 253; HDL 67; LDL Cholesterol 158; Triglycerides 142;  VLDL 28 Continue atorvastatin 40 mg tablet daily  Disposition: Follow-up with APP and 2 weeks.    Medications reviewed, stressed importance of dialysis compliance, renal diet compliance and keeping follow-up appointments.  Patient and his wife agreed with plan of care and all questions were answered.  Patient and his wife have concerns at this time.  Deberah Pelton, NP-C 09/07/2019, 4:03 PM

## 2019-09-08 ENCOUNTER — Ambulatory Visit (INDEPENDENT_AMBULATORY_CARE_PROVIDER_SITE_OTHER): Payer: Self-pay | Admitting: Physician Assistant

## 2019-09-08 VITALS — BP 170/95 | HR 95 | Temp 97.9°F | Resp 20 | Ht 69.0 in | Wt 172.3 lb

## 2019-09-08 DIAGNOSIS — Z992 Dependence on renal dialysis: Secondary | ICD-10-CM

## 2019-09-08 DIAGNOSIS — N186 End stage renal disease: Secondary | ICD-10-CM

## 2019-09-08 NOTE — Progress Notes (Signed)
    Postoperative Access Visit   History of Present Illness   William [name] is a 33 y.o. year old male who presents for postoperative follow-up for: right forearm arteriovenous graft by Dr. Scot Dock (Date: 08/15/19).  The patient's wounds are healed.  The patient denies steal symptoms.  The patient has been to the emergency department with significant right arm edema.  He has been receiving IV vancomycin during dialysis treatments until last treatment yesterday.  Edema has drastically improved with elevation and compression with a light Ace wrap.  Patient admittedly has not been using the Ace wrap as much.  He continues to notice improvement in swelling and pain however does still have some pain near the anastomosis.  He denies any fevers, chills, nausea/vomiting.  He continues to dialyze via right [facility] on a Tuesday Thursday Saturday schedule at the kidney center on [address] in [place].   Physical Examination   Vitals:   09/08/19 1533  BP: (!) 170/95  Pulse: 95  Resp: 20  Temp: 97.9 F (36.6 C)  SpO2: 100%  Weight: 172 lb 4.8 oz (78.2 kg)  Height: 5\' 9"  (1.753 m)   Body mass index is 25.44 kg/m.  right arm Incision are healed, still some edema R forearm however no areas of fluctuance, hand grip is 5/5, sensation in digits is intact, palpable thrill, bruit can  be auscultated, palpable R radial pulse     Medical Decision Making   William [name] is a 33 y.o. year old male who presents s/p right forearm arteriovenous graft   Patent AV graft without signs or symptoms of steal syndrome; palpable R radial pulse  Edema of right arm nearly resolved  I would favor another 2 to 3 weeks of elevation and compression with Ace wrap prior to accessing graft for dialysis  Continue HD via [facility] for now  The AV graft will be ready for use 09/26/2019  The patient's tunneled dialysis catheter can be removed when Nephrology is comfortable with the performance of the graft  The  patient may follow up on a prn basis    Dagoberto Ligas PA-C [facility] Office: [phone]  Clinic MD: Dr. Donnetta Hutching

## 2019-09-11 ENCOUNTER — Ambulatory Visit: Payer: Medicaid Other | Admitting: Physical Therapy

## 2019-09-12 ENCOUNTER — Encounter: Payer: Self-pay | Admitting: Adult Health

## 2019-09-12 ENCOUNTER — Ambulatory Visit: Payer: Medicaid Other | Admitting: Adult Health

## 2019-09-12 ENCOUNTER — Inpatient Hospital Stay: Payer: Self-pay | Admitting: Adult Health

## 2019-09-12 ENCOUNTER — Other Ambulatory Visit: Payer: Self-pay

## 2019-09-12 VITALS — BP 122/64 | HR 76 | Temp 97.1°F | Ht 69.0 in | Wt 176.2 lb

## 2019-09-12 DIAGNOSIS — I10 Essential (primary) hypertension: Secondary | ICD-10-CM | POA: Diagnosis not present

## 2019-09-12 DIAGNOSIS — F1911 Other psychoactive substance abuse, in remission: Secondary | ICD-10-CM

## 2019-09-12 DIAGNOSIS — I639 Cerebral infarction, unspecified: Secondary | ICD-10-CM

## 2019-09-12 DIAGNOSIS — Z992 Dependence on renal dialysis: Secondary | ICD-10-CM

## 2019-09-12 DIAGNOSIS — E785 Hyperlipidemia, unspecified: Secondary | ICD-10-CM | POA: Diagnosis not present

## 2019-09-12 DIAGNOSIS — N186 End stage renal disease: Secondary | ICD-10-CM | POA: Diagnosis not present

## 2019-09-12 NOTE — Patient Instructions (Addendum)
Continue to increase activity and exercising as tolerated  Speak with your dialysis center in regards to what your current nephrologist is for needed follow-up and management of nausea/vomiting  Continue clopidogrel 75 mg daily  and Lipitor for secondary stroke prevention  Continue to follow up with PCP/cardiology regarding cholesterol and blood pressure management   Continue to monitor blood pressure at home  [place] job with cessation of substance use!!  This will greatly decrease your chance of reoccurring stroke and improve your overall health!  Maintain strict control of hypertension with blood pressure goal below 130/90, diabetes with hemoglobin A1c goal below 6.5% and cholesterol with LDL cholesterol (bad cholesterol) goal below 70 mg/dL. I also advised the patient to eat a healthy diet with plenty of whole grains, cereals, fruits and vegetables, exercise regularly and maintain ideal body weight.  Followup in the future with me in 3 months or call earlier if needed       Thank you for coming to see [place] at [facility]. I hope we have been able to provide you high quality care today.  You may receive a patient satisfaction survey over the next few weeks. We would appreciate your feedback and comments so that we may continue to improve ourselves and the health of our patients.

## 2019-09-12 NOTE — Progress Notes (Signed)
[facility] [address] [place] [address] [phone]       HOSPITAL FOLLOW UP NOTE  Mr. Nathaneal Anzelmo Date of Birth:  [birth year] Medical Record Number:  [id number]   Reason for Referral:  hospital stroke follow up    CHIEF COMPLAINT:  Chief Complaint  Patient presents with   Hospitalization Follow-up    Wife present. Rm 9. Patient's wife mentioned that he has been dealing with alot nausea and fatigue recently.     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  History obtained from patient, wife and chart review. Reviewed all radiology images and labs personally.  Mr. Karapet Martelli is a 33 y.o. male with history of hypertension who initially presented with mild chest tightness and shortness of breath for several days with intermittent headache.  Noted to be hypertensive.  CT found to have bilateral patchy abnormalities.  No history of alcohol abuse.  Cocaine positive.  Neuro consulted.  Presenting with confusion and hypertensive encephalopathy.  MRI showed widespread numerous scattered acute infarcts in both cerebral hemispheres and brainstem associated T2 hyperintensity and expected of the pons, medulla and medial thalami evidence of underlying small vessel disease of the brain including microhemorrhages throughout as well as superimposed corpus callosum and cerebral white matter disease likely secondary to small vessel disease.  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  MRA unremarkable.  2D echo normal EF without cardiac source thrombus identified and small petechial effusion.  LDL 158.  A1c 4.9.  Urine drug screen positive  for THC, cocaine and benzos.  Blood pressure as high as 254/167 treated with Cardene drip and stabilized admission and recommended long-term BP goal normotensive range.  Recommended initiating statin once able to swallow.  Other stroke risk factors include history of polysubstance abuse but no prior history of stroke.  Recommended DAPT for 3 weeks followed by aspirin alone.  Discharged to CIR for ongoing therapy on 06/29/2019.  He was discharged back to acute hospital on 07/07/2019 due to ongoing decline in GFR for ongoing monitoring.  He was eventually discharged home the following day on 07/08/2019 with recommendation of outpatient therapies.  He returned to ED on 08/08/2019 with complaints of shortness of breath with creatinine 27 and underwent temporary dialysis catheter placement and started on dialysis on 08/09/2019.  He underwent AV graft placement on 08/15/2019.  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  He is being seen today for hospital follow-up accompanied by his wife.  Residual deficits of occassional imbalance but does endorse improvement.  He is able to ambulate but limited due to increased shortness of breath with increased exertion.  He does endorse slowly trying to increase activity tolerance and exercise.  He has not been to therapy due to frequency of HD. HD 3x weekly. Wife endorses increase nausea/vomitting, weight loss and fatigue since initiating HD.  Previously been seen at [facility] but since initiation of HD, she is unsure of current nephrologist.  Completed 3 weeks DAPT and continues on Plavix alone (per wife, Plavix recommended over aspirin by nephrologist) without bleeding or bruising.  Continues on atorvastatin without myalgias.  Blood pressure greatly improved with todays level 122/64.  Continues  to follow closely with cardiology for HTN management.  Currently in the process  of applying for Social Security disability.  He does endorse complete cessation of substance abuse since July.  Denies new or worsening stroke/TIA symptoms.      ROS:   14 system review of systems performed and negative with exception of nausea, vomiting, weight loss, fatigue and dizziness  PMH:  Past Medical History:  Diagnosis Date   Anemia    Drug abuse, cocaine type ([place]) 06/23/2019   ESRD (end stage renal disease) ([place]) 08/08/2019   Hyperlipidemia    Hypertension    Stroke ([place]) 06/2019    PSH:  Past Surgical History:  Procedure Laterality Date   AV FISTULA PLACEMENT Right 08/15/2019   Procedure: INSERTION OF ARTERIOVENOUS (AV) GORE-TEX GRAFT ARM. Using 4-52mm STRETCH Goretex.;  Surgeon: Angelia Mould, MD;  Location: [facility] OR;  Service: Vascular;  Laterality: Right;   BIOPSY  08/27/2019   Procedure: BIOPSY;  Surgeon: Ronnette Juniper, MD;  Location: [facility] ENDOSCOPY;  Service: Gastroenterology;;   ESOPHAGOGASTRODUODENOSCOPY (EGD) WITH PROPOFOL N/A 08/27/2019   Procedure: ESOPHAGOGASTRODUODENOSCOPY (EGD) WITH PROPOFOL;  Surgeon: Ronnette Juniper, MD;  Location: [place];  Service: Gastroenterology;  Laterality: N/A;   INSERTION OF DIALYSIS CATHETER Right 08/08/2019   Procedure: INSERTION OF right internal jugular DIALYSIS CATHETER;  Surgeon: Angelia Mould, MD;  Location: [facility] OR;  Service: Vascular;  Laterality: Right;   RADIOLOGY WITH ANESTHESIA N/A 06/23/2019   Procedure: MRI OF VESSELS;  Surgeon: Radiologist, Medication, MD;  Location: [place];  Service: Radiology;  Laterality: N/A;    Social History:  Social History   Socioeconomic History   Marital status: Married    Spouse name: [name]   Number of children: 3   Years of education: Not on file   Highest education level: High school graduate  Occupational History   Occupation: Secretary/administrator - laid off  Scientist, product/process development strain: Not hard at all   Food insecurity    Worry: Never true     Inability: Never true   Transportation needs    Medical: No    Non-medical: No  Tobacco Use   Smoking status: Former Smoker    Packs/day: 0.00    Years: 20.00    Pack years: 0.00    Quit date: 06/2019    Years since quitting: 0.2   Smokeless tobacco: Never Used  Substance and Sexual Activity   Alcohol use: Not Currently    Comment: 2-3 beers a day; stopped with 7/20 hospitalization   Drug use: Yes    Types: Marijuana, Cocaine, Benzodiazepines    Comment: denies use since 7/20 hospitalization   Sexual activity: Not Currently    Partners: Female  Lifestyle   Physical activity    Days per week: 0 days    Minutes per session: 0 min   Stress: Not at all  Relationships   Social connections    Talks on phone: More than three times a week    Gets together: Never    Attends religious service: 1 to 4 times per year    Active member of club or organization: No    Attends meetings of clubs or organizations: Never    Relationship status: Married   Intimate partner violence    Fear of current or ex partner: No    Emotionally abused: No    Physically abused: No    Forced sexual activity: No  Other Topics Concern   Not on file  Social History Narrative   Not on file    Family History:  Family History  Problem Relation Age of Onset   Diabetes Sister    Diabetes Maternal Uncle    Hypertension Father    Hypertension Paternal Grandfather    Prostate cancer Paternal Grandfather    Diabetes Sister    Renal Disease Neg Hx     Medications:   Current Outpatient Medications on File Prior to Visit  Medication Sig Dispense Refill   amLODipine (NORVASC) 10 MG tablet Take 1 tablet (10 mg total) by mouth at bedtime. 30 tablet 0   atorvastatin (LIPITOR) 40 MG tablet Take 1 tablet (40 mg total) by mouth daily at 6 PM.     carvedilol (COREG) 25 MG tablet Take 1 tablet (25 mg total) by mouth 2 (two) times daily with a meal.     chlorproMAZINE (THORAZINE) 10 MG tablet  Take 1 tablet (10 mg total) by mouth 3 (three) times daily as needed for hiccoughs. 20 tablet 0   clopidogrel (PLAVIX) 75 MG tablet Take 75 mg by mouth daily.     folic acid-vitamin b complex-vitamin c-selenium-zinc (DIALYVITE) 3 MG TABS tablet Take 1 tablet by mouth daily.     hydrALAZINE (APRESOLINE) 100 MG tablet Take 1 tablet (100 mg total) by mouth 3 (three) times daily with meals. 30 tablet 3   minoxidil (LONITEN) 2.5 MG tablet Take 2 tablets (5 mg total) by mouth 2 (two) times daily. 60 tablet 0   nicotine (NICODERM CQ - DOSED IN MG/24 HR) 7 mg/24hr patch Place 7 mg onto the skin daily.     oxyCODONE-acetaminophen (PERCOCET) 5-325 MG tablet Take 1 tablet by mouth every 6 (six) hours as needed for severe pain. 10 tablet 0   pantoprazole (PROTONIX) 40 MG tablet Take 1 tablet (40 mg total) by mouth daily. 30 tablet 0   sucroferric oxyhydroxide (VELPHORO) 500 MG chewable tablet Chew 500 mg by mouth 3 (three) times daily with meals.     spironolactone (ALDACTONE) 25 MG tablet Take 25 mg by mouth daily.     No current facility-administered medications on file prior to visit.     Allergies:   Allergies  Allergen Reactions   Reglan [Metoclopramide] Nausea And Vomiting    Develops akathisia     Physical Exam  Vitals:   09/12/19 0950  BP: 122/64  Pulse: 76  Temp: (!) 97.1 F (36.2 C)  TempSrc: Oral  Weight: 176 lb 3.2 oz (79.9 kg)  Height: 5\' 9"  (1.753 m)   Body mass index is 26.02 kg/m. No exam data present  No flowsheet data found.   General: well developed, well nourished, pleasant middle-age African-American male, seated, in no evident distress Head: head normocephalic and atraumatic.   Neck: supple with no carotid or supraclavicular bruits Cardiovascular: regular rate and rhythm, no murmurs Musculoskeletal: no deformity Skin:  no rash/petichiae Vascular:  Normal pulses all extremities; AV fistula positive bruit and thrill   Neurologic Exam Mental Status:  Awake and fully alert. Oriented to place and time. Recent and remote memory intact. Attention span, concentration and fund of knowledge appropriate. Mood and affect appropriate.  Cranial Nerves: Fundoscopic exam reveals sharp disc margins. Pupils equal, briskly reactive to light. Extraocular movements full without nystagmus. Visual fields full to confrontation. Hearing intact. Facial sensation intact. Face, tongue, palate moves normally and symmetrically.  Motor: Normal bulk and tone. Normal strength in all tested extremity muscles. Sensory.: intact to touch , pinprick , position and vibratory  sensation.  Coordination: Rapid alternating movements normal in all extremities. Finger-to-nose and heel-to-shin performed accurately bilaterally. Gait and Station: Arises from chair without difficulty. Stance is normal. Gait demonstrates normal stride length with occasional imbalance.  Mild difficulty with tandem gait. Reflexes: 1+ and symmetric. Toes downgoing.     NIHSS  0 Modified Rankin  2    Diagnostic Data (Labs, Imaging, Testing)   CT head patchy B caudate, PVWM, and R thalamic areas with microvascular ischemia.  No hemorrhage  MRI widespread numerous scattered acute infarcts in both cerebral hemispheres and brainstem with severe T2 hyperintensity and expected of the pons, medulla and medial thalami evidence of underlying small vessel disease of the brain including microhemorrhages throughout.  Superimposed corpus callosum and cerebral white matter disease.  No hemorrhage.  MRA head/neck negative  2D Echo EF greater than 65%.  No source of embolus.  Small pericardial effusion  LDL 158  HgbA1c  4.9  Urine drug screen positive for THC, cocaine and benzos    ASSESSMENT: Berney Puett is a 33 y.o. year old male initially presented on 06/21/2019 with mild chest tightness and shortness of breath for several days with intermittent headache along with hypertension with imaging showing  numerous bilateral anterior and posterior circulation infarcts throughout secondary to small vessel disease.  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 Vascular risk factors include HTN, HLD and polysubstance drug abuse.  He appears to have recovered well from a stroke standpoint with minimal residual deficits of occasional balance difficulties.  Found to be in ESRD likely secondary to uncontrolled HTN and cocaine abuse on 08/08/2019 with AV fistula placed and initiated HD.    PLAN:  1. Numerous bilateral strokes: Continue clopidogrel 75 mg daily  and atorvastatin for secondary stroke prevention. Maintain strict control of hypertension with blood pressure goal below 130/90, diabetes with hemoglobin A1c goal below 6.5% and cholesterol with LDL cholesterol (bad cholesterol) goal below 70 mg/dL.  I also advised the patient to eat a healthy diet with plenty of whole grains, cereals, fruits and vegetables, exercise regularly with at least 30 minutes of continuous activity daily and maintain ideal body weight. 2. HTN: Advised to continue current treatment regimen.  Today's BP 122/64.  Advised to continue to monitor at home along with continued follow-up with PCP for management 3. HLD: Advised to continue current treatment regimen along with continued follow-up with PCP for future prescribing and monitoring of lipid panel 4. ESRD on HD: Follow-up with dialysis center tomorrow to determine current nephrologist and schedule follow-up visit in regards to nausea/vomiting for possible intervention 5. Hx substance abuse: Congratulated on complete cessation and highly encouraged continuation of cessation    Follow up in 3 months or call earlier if needed   Greater than 50% of time during this 45 minute visit was spent on  counseling, explanation of diagnosis of numerous bilateral strokes, reviewing risk factor management of HTN, HLD, ESRD and history of substance abuse, planning of further management along with potential future management, and discussion with patient and family answering all questions.    Frann Rider, AGNP-BC  [facility] [address] [place], [place] [address]  Phone [phone] Fax [phone] Note: This document was prepared with digital dictation and possible smart phrase technology. Any transcriptional errors that result from this process are unintentional.

## 2019-09-12 NOTE — Progress Notes (Deleted)
[facility] [address] [place]. [place] [address] [phone]       HOSPITAL FOLLOW UP NOTE  [name] Date of Birth:  11-06-1986 Medical Record Number:  [id number]   Reason for Referral:  hospital stroke follow up    CHIEF COMPLAINT:  No chief complaint on file.   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  History obtained from *** and chart review. Reviewed all radiology images and labs personally.  [name] is a 33 y.o. male with history of hypertension who initially presented with mild chest tightness and shortness of breath for several days with intermittent headache.  Noted to be hypertensive.  CT found to have bilateral patchy abnormalities.  No history of alcohol abuse.  Cocaine positive.  Neuro consulted.  Presenting with confusion and hypertensive encephalopathy.  MRI showed widespread numerous scattered acute infarcts in both cerebral hemispheres and brainstem associated T2 hyperintensity and expected of the pons, medulla and medial thalami evidence of underlying small vessel disease of the brain including microhemorrhages throughout as well as superimposed corpus callosum and cerebral white matter disease likely secondary to small vessel disease.  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  MRA ***.  2D echo normal EF without cardiac source thrombus identified and small petechial effusion.  LDL 158.  A1c ***.  Urine drug screen positive for THC, cocaine and benzos.  Blood pressure as high as 254/167 treated with Cardene drip and stabilized admission and recommended long-term BP goal normotensive range.   Recommended initiating statin once able to swallow.  Other stroke risk factors include history of polysubstance abuse but no prior history of stroke.  Recommended DAPT for 3 weeks followed by aspirin alone.  Discharged to CIR for ongoing therapy on 06/29/2019.  He was discharged back to acute hospital on 07/07/2019 due to ongoing decline in GFR for ongoing monitoring.  He was eventually discharged home the following day on 07/08/2019 with recommendation of outpatient therapies.  He returned to ED on 08/08/2019 with complaints of shortness of breath with creatinine 27 and underwent temporary dialysis catheter placement and started on dialysis on 08/09/2019.  He underwent AV graft placement on 08/15/2019.  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  Is being seen today for hospital follow-up.  Residual deficits ***.  Completed 3 weeks DAPT and continues on aspirin alone without bleeding or bruising.  Continues on atorvastatin without myalgias.  Blood pressure today ***.  Continues to follow closely with cardiology for HTN management.    ROS:   14 system review of systems performed and negative with exception of ***  PMH:  Past Medical History:  Diagnosis Date   Anemia    Drug abuse, cocaine type ([place]) 06/23/2019   ESRD (end stage renal disease) ([place]) 08/08/2019   Hyperlipidemia    Hypertension    Stroke ([place]) 06/2019    PSH:  Past Surgical History:  Procedure Laterality Date   AV FISTULA PLACEMENT Right 08/15/2019   Procedure: INSERTION OF ARTERIOVENOUS (AV) GORE-TEX GRAFT ARM. Using 4-21mm STRETCH Goretex.;  Surgeon: Angelia Mould, MD;  Location: [place];  Service: Vascular;  Laterality: Right;   BIOPSY  08/27/2019   Procedure:  BIOPSY;  Surgeon: Ronnette Juniper, MD;  Location: [facility] ENDOSCOPY;  Service: Gastroenterology;;   ESOPHAGOGASTRODUODENOSCOPY (EGD) WITH PROPOFOL N/A 08/27/2019   Procedure:  ESOPHAGOGASTRODUODENOSCOPY (EGD) WITH PROPOFOL;  Surgeon: Ronnette Juniper, MD;  Location: [place];  Service: Gastroenterology;  Laterality: N/A;   INSERTION OF DIALYSIS CATHETER Right 08/08/2019   Procedure: INSERTION OF right internal jugular DIALYSIS CATHETER;  Surgeon: Angelia Mould, MD;  Location: [facility] OR;  Service: Vascular;  Laterality: Right;   RADIOLOGY WITH ANESTHESIA N/A 06/23/2019   Procedure: MRI OF VESSELS;  Surgeon: Radiologist, Medication, MD;  Location: [place];  Service: Radiology;  Laterality: N/A;    Social History:  Social History   Socioeconomic History   Marital status: Married    Spouse name: [name]   Number of children: 3   Years of education: Not on file   Highest education level: High school graduate  Occupational History   Occupation: Secretary/administrator - laid off  Scientist, product/process development strain: Not hard at all   Food insecurity    Worry: Never true    Inability: Never true   Transportation needs    Medical: No    Non-medical: No  Tobacco Use   Smoking status: Former Smoker    Packs/day: 0.00    Years: 20.00    Pack years: 0.00    Quit date: 06/2019    Years since quitting: 0.2   Smokeless tobacco: Never Used  Substance and Sexual Activity   Alcohol use: Not Currently    Comment: 2-3 beers a day; stopped with 7/20 hospitalization   Drug use: Yes    Types: Marijuana, Cocaine, Benzodiazepines    Comment: denies use since 7/20 hospitalization   Sexual activity: Not Currently    Partners: Female  Lifestyle   Physical activity    Days per week: 0 days    Minutes per session: 0 min   Stress: Not at all  Relationships   Social connections    Talks on phone: More than three times a week    Gets together: Never    Attends religious service: 1 to 4 times per year    Active member of club or organization: No    Attends meetings of clubs or organizations: Never    Relationship status: Married   Intimate partner violence      Fear of current or ex partner: No    Emotionally abused: No    Physically abused: No    Forced sexual activity: No  Other Topics Concern   Not on file  Social History Narrative   Not on file    Family History:  Family History  Problem Relation Age of Onset   Diabetes Sister    Diabetes Maternal Uncle    Hypertension Father    Hypertension Paternal Grandfather    Prostate cancer Paternal Grandfather    Diabetes Sister    Renal Disease Neg Hx     Medications:   Current Outpatient Medications on File Prior to Visit  Medication Sig Dispense Refill   amLODipine (NORVASC) 10 MG tablet Take 1 tablet (10 mg total) by mouth at bedtime. 30 tablet 0   atorvastatin (LIPITOR) 40 MG tablet Take 1 tablet (40 mg total) by mouth daily at 6 PM.     carvedilol (COREG) 25 MG tablet Take 1 tablet (25 mg total) by mouth 2 (two) times daily with a meal.     chlorproMAZINE (THORAZINE) 10 MG tablet Take 1 tablet (10 mg total) by mouth  3 (three) times daily as needed for hiccoughs. 20 tablet 0   clopidogrel (PLAVIX) 75 MG tablet Take 75 mg by mouth daily.     folic acid-vitamin b complex-vitamin c-selenium-zinc (DIALYVITE) 3 MG TABS tablet Take 1 tablet by mouth daily.     hydrALAZINE (APRESOLINE) 100 MG tablet Take 1 tablet (100 mg total) by mouth 3 (three) times daily with meals. 30 tablet 3   minoxidil (LONITEN) 2.5 MG tablet Take 2 tablets (5 mg total) by mouth 2 (two) times daily. 60 tablet 0   nicotine (NICODERM CQ - DOSED IN MG/24 HR) 7 mg/24hr patch Place 7 mg onto the skin daily.     oxyCODONE-acetaminophen (PERCOCET) 5-325 MG tablet Take 1 tablet by mouth every 6 (six) hours as needed for severe pain. 10 tablet 0   pantoprazole (PROTONIX) 40 MG tablet Take 1 tablet (40 mg total) by mouth daily. 30 tablet 0   spironolactone (ALDACTONE) 25 MG tablet Take 25 mg by mouth daily.     sucroferric oxyhydroxide (VELPHORO) 500 MG chewable tablet Chew 500 mg by mouth 3 (three)  times daily with meals.     No current facility-administered medications on file prior to visit.     Allergies:   Allergies  Allergen Reactions   Reglan [Metoclopramide] Nausea And Vomiting    Develops akathisia     Physical Exam  There were no vitals filed for this visit. There is no height or weight on file to calculate BMI. No exam data present  No flowsheet data found.   General: well developed, well nourished, seated, in no evident distress Head: head normocephalic and atraumatic.   Neck: supple with no carotid or supraclavicular bruits Cardiovascular: regular rate and rhythm, no murmurs Musculoskeletal: no deformity Skin:  no rash/petichiae Vascular:  Normal pulses all extremities   Neurologic Exam Mental Status: Awake and fully alert. Oriented to place and time. Recent and remote memory intact. Attention span, concentration and fund of knowledge appropriate. Mood and affect appropriate.  Cranial Nerves: Fundoscopic exam reveals sharp disc margins. Pupils equal, briskly reactive to light. Extraocular movements full without nystagmus. Visual fields full to confrontation. Hearing intact. Facial sensation intact. Face, tongue, palate moves normally and symmetrically.  Motor: Normal bulk and tone. Normal strength in all tested extremity muscles. Sensory.: intact to touch , pinprick , position and vibratory sensation.  Coordination: Rapid alternating movements normal in all extremities. Finger-to-nose and heel-to-shin performed accurately bilaterally. Gait and Station: Arises from chair without difficulty. Stance is normal. Gait demonstrates normal stride length and balance Reflexes: 1+ and symmetric. Toes downgoing.     NIHSS  *** Modified Rankin  *** CHA2DS2-VASc *** HAS-BLED ***   Diagnostic Data (Labs, Imaging, Testing)  CT HEAD WO CONTRAST ***  CT ANGIO HEAD W OR WO CONTRAST CT ANGIO NECK W OR WO CONTRAST ***  MR BRAIN WO CONTRAST ***  MR MRA HEAD  MR  MRA NECK ***  ECHOCARDIOGRAM ***    ASSESSMENT: William [name] is a 33 y.o. 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 Vascular risk factors include HTN, HLD and polysubstance drug abuse.  PLAN:  1. Numerous bilateral strokes: Continue aspirin 81 mg daily  and ***  for secondary stroke prevention. Maintain strict control of hypertension with blood pressure goal below 130/90, diabetes with hemoglobin A1c goal below 6.5% and cholesterol with LDL cholesterol (bad cholesterol) goal below 70 mg/dL.  I also advised the patient to eat a healthy diet with plenty of whole grains, cereals, fruits and vegetables, exercise regularly with at least 30 minutes of continuous activity daily and maintain ideal body weight. 2. HTN: Advised to continue current treatment regimen.  Today's BP ***.  Advised to continue to monitor at home along with continued follow-up with PCP for management 3. HLD: Advised to continue current treatment regimen along with continued follow-up with PCP for future prescribing and monitoring of lipid panel 4.     Follow up in *** or call earlier if needed   Greater than 50% of time during this 45 minute visit was spent on counseling, explanation of diagnosis of ***, reviewing risk factor management of ***, planning of further management along with potential future management, and discussion with patient and family answering all questions.    Frann Rider, AGNP-BC  [facility] [address] [place], [place] [address]  Phone [phone] Fax [phone] Note: This document was prepared with digital dictation and possible smart phrase technology. Any transcriptional errors that result from this process are unintentional.

## 2019-09-13 NOTE — Progress Notes (Signed)
I agree with the above plan 

## 2019-09-17 ENCOUNTER — Emergency Department (HOSPITAL_COMMUNITY)
Admission: EM | Admit: 2019-09-17 | Discharge: 2019-09-18 | Disposition: A | Discharge status: Home / Self Care | Payer: Medicaid Other | Attending: Emergency Medicine | Admitting: Emergency Medicine

## 2019-09-17 ENCOUNTER — Other Ambulatory Visit: Payer: Self-pay

## 2019-09-17 ENCOUNTER — Encounter (HOSPITAL_COMMUNITY): Payer: Self-pay | Admitting: *Deleted

## 2019-09-17 DIAGNOSIS — Z7901 Long term (current) use of anticoagulants: Secondary | ICD-10-CM | POA: Insufficient documentation

## 2019-09-17 DIAGNOSIS — R369 Urethral discharge, unspecified: Secondary | ICD-10-CM | POA: Diagnosis present

## 2019-09-17 DIAGNOSIS — I120 Hypertensive chronic kidney disease with stage 5 chronic kidney disease or end stage renal disease: Secondary | ICD-10-CM | POA: Insufficient documentation

## 2019-09-17 DIAGNOSIS — R361 Hematospermia: Secondary | ICD-10-CM | POA: Diagnosis not present

## 2019-09-17 DIAGNOSIS — N186 End stage renal disease: Secondary | ICD-10-CM | POA: Insufficient documentation

## 2019-09-17 DIAGNOSIS — Z79899 Other long term (current) drug therapy: Secondary | ICD-10-CM | POA: Diagnosis not present

## 2019-09-17 NOTE — ED Triage Notes (Signed)
Pt says that after having sex with his wife he noticed a brown color in his semen. No other penile discharge, penile pain or problems urinating. Pt has also been out of his hydralazine for 2 days. Dialysis MWF

## 2019-09-18 NOTE — ED Notes (Signed)
Pt reports discharge that started today, no pain at all.  Pt last went to dialysis on Friday, scheduled for Monday.

## 2019-09-18 NOTE — ED Provider Notes (Signed)
[facility] EMERGENCY DEPARTMENT Provider Note   CSN: [id number] Arrival date & time: 09/17/19  1925     History   Chief Complaint Chief Complaint  Patient presents with  . Penile Discharge    HPI Linn Liechty is a 33 y.o. male.     Patient presents to the emergency department with a chief complaint of blood in his semen.  He states that after having intercourse tonight, he noticed some blood/brown tint in his semen.  He denies any trauma during intercourse.  Denies any fever, chills, or dysuria.  He does not make much urine.  He denies any penile discharge or new sexual contacts.  He has no other complaints.  The history is provided by the patient. No language interpreter was used.    Past Medical History:  Diagnosis Date  . Anemia   . Drug abuse, cocaine type ([place]) 06/23/2019  . ESRD (end stage renal disease) ([place]) 08/08/2019  . Hyperlipidemia   . Hypertension   . Stroke [facility]) 06/2019    Patient Active Problem List   Diagnosis Date Noted  . Anemia 08/26/2019  . Arm swelling   . Device, implant, or graft complication   . Fever   . Symptomatic anemia   . Infection of AV graft for dialysis ([place]) 08/18/2019  . Sepsis ([place]) 08/18/2019  . Anemia in ESRD (end-stage renal disease) ([place]) 08/18/2019  . Hypokalemia 08/18/2019  . ESRD on dialysis ([place]) 08/08/2019  . Anemia of chronic disease 07/08/2019  . AKI (acute kidney injury) ([place]) 07/08/2019  . HTN (hypertension) 07/07/2019  . Stroke ([place]) 07/07/2019  . HLD (hyperlipidemia) 07/07/2019  . Polysubstance abuse ([place]) 07/07/2019  . Tobacco abuse 07/07/2019  . Uncontrolled hypertension   . Acute kidney injury ([place])   . PRES (posterior reversible encephalopathy syndrome) 06/29/2019  . 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 Hypertensive emergency 06/22/2019  . Acute renal failure [facility])     Past Surgical History:  Procedure Laterality Date  . AV FISTULA PLACEMENT Right  08/15/2019   Procedure: INSERTION OF ARTERIOVENOUS (AV) GORE-TEX GRAFT ARM. Using 4-70mm STRETCH Goretex.;  Surgeon: Angelia Mould, MD;  Location: [facility] OR;  Service: Vascular;  Laterality: Right;  . BIOPSY  08/27/2019   Procedure: BIOPSY;  Surgeon: Ronnette Juniper, MD;  Location: [facility] ENDOSCOPY;  Service: Gastroenterology;;  . ESOPHAGOGASTRODUODENOSCOPY (EGD) WITH PROPOFOL N/A 08/27/2019   Procedure: ESOPHAGOGASTRODUODENOSCOPY (EGD) WITH PROPOFOL;  Surgeon: Ronnette Juniper, MD;  Location: [place];  Service: Gastroenterology;  Laterality: N/A;  . INSERTION OF DIALYSIS CATHETER Right 08/08/2019   Procedure: INSERTION OF right internal jugular DIALYSIS CATHETER;  Surgeon: Angelia Mould, MD;  Location: [place];  Service: Vascular;  Laterality: Right;  . RADIOLOGY WITH ANESTHESIA N/A 06/23/2019   Procedure: MRI OF VESSELS;  Surgeon: Radiologist, Medication, MD;  Location: [place];  Service: Radiology;  Laterality: N/A;        Home Medications    Prior to Admission medications   Medication Sig Start Date End Date Taking? Authorizing Provider  amLODipine (NORVASC) 10 MG tablet Take 1 tablet (10 mg total) by mouth at bedtime. 07/08/19   Debbe Odea, MD  atorvastatin (LIPITOR) 40 MG tablet Take 1 tablet (40 mg total) by mouth daily at 6 PM. 06/29/19   Shelly Coss, MD  carvedilol (COREG) 25 MG tablet Take 1 tablet (25 mg total) by mouth 2 (two) times daily with a meal. 06/29/19   Shelly Coss, MD  chlorproMAZINE (THORAZINE) 10 MG tablet Take  1 tablet (10 mg total) by mouth 3 (three) times daily as needed for hiccoughs. 08/10/19   Shelly Coss, MD  clopidogrel (PLAVIX) 75 MG tablet Take 75 mg by mouth daily. 08/11/19   [provider]  folic acid-vitamin b complex-vitamin c-selenium-zinc (DIALYVITE) 3 MG TABS tablet Take 1 tablet by mouth daily.    [provider]  hydrALAZINE (APRESOLINE) 100 MG tablet Take 1 tablet (100 mg total) by mouth 3 (three) times daily with meals.  09/07/19   Deberah Pelton, NP  minoxidil (LONITEN) 2.5 MG tablet Take 2 tablets (5 mg total) by mouth 2 (two) times daily. 08/20/19   Debbe Odea, MD  nicotine (NICODERM CQ - DOSED IN MG/24 HR) 7 mg/24hr patch Place 7 mg onto the skin daily.    [provider]  oxyCODONE-acetaminophen (PERCOCET) 5-325 MG tablet Take 1 tablet by mouth every 6 (six) hours as needed for severe pain. 08/27/19 08/26/20  Arrien, Jimmy Picket, MD  pantoprazole (PROTONIX) 40 MG tablet Take 1 tablet (40 mg total) by mouth daily. 08/27/19 09/26/19  Arrien, Jimmy Picket, MD  spironolactone (ALDACTONE) 25 MG tablet Take 25 mg by mouth daily.    [provider]  sucroferric oxyhydroxide (VELPHORO) 500 MG chewable tablet Chew 500 mg by mouth 3 (three) times daily with meals. 08/21/19   [provider]    Family History Family History  Problem Relation Age of Onset  . Diabetes Sister   . Diabetes Maternal Uncle   . Hypertension Father   . Hypertension Paternal Grandfather   . Prostate cancer Paternal Grandfather   . Diabetes Sister   . Renal Disease Neg Hx     Social History Social History   Tobacco Use  . Smoking status: Former Smoker    Packs/day: 0.00    Years: 20.00    Pack years: 0.00    Quit date: 06/2019    Years since quitting: 0.2  . Smokeless tobacco: Never Used  Substance Use Topics  . Alcohol use: Not Currently    Comment: 2-3 beers a day; stopped with 7/20 hospitalization  . Drug use: Yes    Types: Marijuana, Cocaine, Benzodiazepines    Comment: denies use since 7/20 hospitalization     Allergies   Reglan [metoclopramide]   Review of Systems Review of Systems  All other systems reviewed and are negative.    Physical Exam Updated Vital Signs BP (!) 202/113   Pulse 84   Temp 98.7 F (37.1 C)   Resp 19   SpO2 98%   Physical Exam Vitals signs and nursing note reviewed.  Constitutional:      General: He is not in acute distress.    Appearance: He  is well-developed. He is not ill-appearing.  HENT:     Head: Normocephalic and atraumatic.  Eyes:     Conjunctiva/sclera: Conjunctivae normal.  Neck:     Musculoskeletal: Neck supple.  Cardiovascular:     Rate and Rhythm: Normal rate.  Pulmonary:     Effort: Pulmonary effort is normal. No respiratory distress.  Abdominal:     General: There is no distension.  Genitourinary:    Comments: There are no masses, sores, or lesions, no penile discharge, circumcised Musculoskeletal:     Comments: Moves all extremities  Skin:    General: Skin is warm and dry.  Neurological:     Mental Status: He is alert and oriented to person, place, and time.  Psychiatric:        Mood  and Affect: Mood normal.        Behavior: Behavior normal.      ED Treatments / Results  Labs (all labs ordered are listed, but only abnormal results are displayed) Labs Reviewed - No data to display  EKG None  Radiology No results found.  Procedures Procedures (including critical care time)  Medications Ordered in ED Medications - No data to display   Initial Impression / Assessment and Plan / ED Course  I have reviewed the triage vital signs and the nursing notes.  Pertinent labs & imaging results that were available during my care of the patient were reviewed by me and considered in my medical decision making (see chart for details).        Patient presents with hematospermia.  He denies any trauma.  He is well-appearing.  He has had no discharge or dysuria.  Denies any fevers or chills.  He is a dialysis patient, does not make much urine.  I doubt UTI.  I have given instructions to follow-up with his primary care doctor should he develop any dysuria, hematuria, or fever.  He is scheduled for dialysis tomorrow.  He denies any shortness of breath or any other additional symptoms at this time.  Patient given reassurance that the hematospermia should be self-limiting and he can follow-up with urology if  he has worsening symptoms.  Final Clinical Impressions(s) / ED Diagnoses   Final diagnoses:  Hematospermia    ED Discharge Orders    None       Montine Circle, PA-C 09/18/19 0126    Veryl Speak, MD 09/18/19 0140

## 2019-09-18 NOTE — Discharge Instructions (Signed)
Hematospermia, or blood in the semen, is usually benign and doesn't require treatment.  This is something that should resolve on its own.  If you have worsening symptoms please follow-up with the urologist.  If you have fever or painful urination, follow-up with your doctor and have a urinalysis performed.  If you notice penile discharge, you should also follow-up with your doctor.

## 2019-09-21 ENCOUNTER — Ambulatory Visit (INDEPENDENT_AMBULATORY_CARE_PROVIDER_SITE_OTHER): Payer: Medicaid Other | Admitting: General Practice

## 2019-09-21 ENCOUNTER — Encounter: Payer: Self-pay | Admitting: General Practice

## 2019-09-21 ENCOUNTER — Other Ambulatory Visit: Payer: Self-pay

## 2019-09-21 VITALS — BP 194/105 | HR 93 | Temp 98.8°F | Ht 69.0 in | Wt 172.0 lb

## 2019-09-21 DIAGNOSIS — I10 Essential (primary) hypertension: Secondary | ICD-10-CM | POA: Diagnosis not present

## 2019-09-21 DIAGNOSIS — E7800 Pure hypercholesterolemia, unspecified: Secondary | ICD-10-CM

## 2019-09-21 NOTE — Patient Instructions (Signed)
Medication Instructions:  RESUME Spironolactone 25mg  Take 1 tablet once a day  RESUME Hydralazine Take 1 tablet three times a day with breakfast, lunch and dinner If you need a refill on your cardiac medications before your next appointment, please call your pharmacy.   Lab work: Your physician recommends that you return for lab work in: 2 week BMET If you have labs (blood work) drawn today and your tests are completely normal, you will receive your results only by: Marland Kitchen MyChart Message (if you have MyChart) OR . A paper copy in the mail If you have any lab test that is abnormal or we need to change your treatment, we will call you to review the results.  Testing/Procedures: None   Follow-Up: At [facility], you and your health needs are our priority.  As part of our continuing mission to provide you with exceptional heart care, we have created designated Provider Care Teams.  These Care Teams include your primary Cardiologist (physician) and Advanced Practice Providers (APPs -  Physician Assistants and Nurse Practitioners) who all work together to provide you with the care you need, when you need it.  . Your physician recommends that you schedule a follow-up appointment in: 2 weeks with Coletta Memos, NP   Any Other Special Instructions Will Be Listed Below (If Applicable). CHECK YOUR BLOOD PRESSURE AT LUNCH TIME ON EVEN DAYS AND IN THE EVENING ON ODD DAYS RECORD BLOOD PRESSURE READINGS ON THE LOG AND BRING WITH YOU TO YOUR NEXT APPOINTMENT

## 2019-09-21 NOTE — Progress Notes (Signed)
Cardiology Clinic Note   Patient Name: William [name] Date of Encounter: 09/21/2019  Primary Care Provider:  Lin Landsman, MD Primary Cardiologist:  Minus Breeding, MD  Patient Profile    William [name] 33 year old male presents today for follow-up of his hypertension.  Past Medical History    Past Medical History:  Diagnosis Date  . Anemia   . Drug abuse, cocaine type ([place]) 06/23/2019  . ESRD (end stage renal disease) ([employer]) 08/08/2019  . Hyperlipidemia   . Hypertension   . Stroke [facility]) 06/2019   Past Surgical History:  Procedure Laterality Date  . AV FISTULA PLACEMENT Right 08/15/2019   Procedure: INSERTION OF ARTERIOVENOUS (AV) GORE-TEX GRAFT ARM. Using 4-1mm STRETCH Goretex.;  Surgeon: Angelia Mould, MD;  Location: [facility] OR;  Service: Vascular;  Laterality: Right;  . BIOPSY  08/27/2019   Procedure: BIOPSY;  Surgeon: Ronnette Juniper, MD;  Location: [facility] ENDOSCOPY;  Service: Gastroenterology;;  . ESOPHAGOGASTRODUODENOSCOPY (EGD) WITH PROPOFOL N/A 08/27/2019   Procedure: ESOPHAGOGASTRODUODENOSCOPY (EGD) WITH PROPOFOL;  Surgeon: Ronnette Juniper, MD;  Location: [place];  Service: Gastroenterology;  Laterality: N/A;  . INSERTION OF DIALYSIS CATHETER Right 08/08/2019   Procedure: INSERTION OF right internal jugular DIALYSIS CATHETER;  Surgeon: Angelia Mould, MD;  Location: [place];  Service: Vascular;  Laterality: Right;  . RADIOLOGY WITH ANESTHESIA N/A 06/23/2019   Procedure: MRI OF VESSELS;  Surgeon: Radiologist, Medication, MD;  Location: [place];  Service: Radiology;  Laterality: N/A;    Allergies  Allergies  Allergen Reactions  . Reglan [Metoclopramide] Nausea And Vomiting    Develops akathisia    History of Present Illness    William [name] was last seen by me on 09/07/2019.  During that time he was hypertensive with his blood pressure ranging in the 160s over 100s.  His hydralazine was changed to 3 times daily and his minoxidil was increased.  He was encouraged  to bring his medication bottles and blood pressure log to his follow-up visits.  He is a Monday Wednesday Friday dialysis patient.  William [name] was seen by Dr. Percival Spanish on 06/23/2019 as a consult for elevated troponins.His elevated troponins were likely due to demand ischemia with tachycardia and severe LVH in the presence of hypertensive urgency.  It was recommended that he avoid beta blockers due to his cocaine history.  At that time his MRI showed no evidence of endocarditis or embolus.  His PMH also includes ESRD on dialysis, cerebral thrombus with cerebral infarct, stroke, infection of AV graft for dialysis, anemia, hyperlipidemia, polysubstance abuse, tobacco abuse, hypokalemia, and arm swelling.  He presents the clinic today and states he has not taken his spironolactone since around the first part of September.  He also states he did not take his middle of the day hydralazine dose today.  His girlfriend is with him today and states that his blood pressure averages have been around Q000111Q systolic and [id number] diastolic daily with blood pressure checks.  He states he continues to go to dialysis on Monday Wednesday Friday and that recently they have not been pulling very much fluid due to his euvolemic status.  He denies chest pain, shortness of breath, lower extremity edema, fatigue, palpitations, melena, hematuria, hemoptysis, diaphoresis, weakness, presyncope, syncope, orthopnea, and PND.    Home Medications    Prior to Admission medications   Medication Sig Start Date End Date Taking? Authorizing Provider  amLODipine (NORVASC) 10 MG tablet Take 1 tablet (10 mg total) by mouth at bedtime. 07/08/19   Rizwan,  Eunice Blase, MD  atorvastatin (LIPITOR) 40 MG tablet Take 1 tablet (40 mg total) by mouth daily at 6 PM. 06/29/19   Shelly Coss, MD  carvedilol (COREG) 25 MG tablet Take 1 tablet (25 mg total) by mouth 2 (two) times daily with a meal. 06/29/19   Shelly Coss, MD  chlorproMAZINE (THORAZINE) 10 MG  tablet Take 1 tablet (10 mg total) by mouth 3 (three) times daily as needed for hiccoughs. 08/10/19   Shelly Coss, MD  clopidogrel (PLAVIX) 75 MG tablet Take 75 mg by mouth daily. 08/11/19   [provider]  folic acid-vitamin b complex-vitamin c-selenium-zinc (DIALYVITE) 3 MG TABS tablet Take 1 tablet by mouth daily.    [provider]  hydrALAZINE (APRESOLINE) 100 MG tablet Take 1 tablet (100 mg total) by mouth 3 (three) times daily with meals. 09/07/19   Deberah Pelton, NP  minoxidil (LONITEN) 2.5 MG tablet Take 2 tablets (5 mg total) by mouth 2 (two) times daily. 08/20/19   Debbe Odea, MD  nicotine (NICODERM CQ - DOSED IN MG/24 HR) 7 mg/24hr patch Place 7 mg onto the skin daily.    [provider]  oxyCODONE-acetaminophen (PERCOCET) 5-325 MG tablet Take 1 tablet by mouth every 6 (six) hours as needed for severe pain. 08/27/19 08/26/20  Arrien, Jimmy Picket, MD  pantoprazole (PROTONIX) 40 MG tablet Take 1 tablet (40 mg total) by mouth daily. 08/27/19 09/26/19  Arrien, Jimmy Picket, MD  spironolactone (ALDACTONE) 25 MG tablet Take 25 mg by mouth daily.    [provider]  sucroferric oxyhydroxide (VELPHORO) 500 MG chewable tablet Chew 500 mg by mouth 3 (three) times daily with meals. 08/21/19   [provider]    Family History    Family History  Problem Relation Age of Onset  . Diabetes Sister   . Diabetes Maternal Uncle   . Hypertension Father   . Hypertension Paternal Grandfather   . Prostate cancer Paternal Grandfather   . Diabetes Sister   . Renal Disease Neg Hx    He indicated that his mother is alive. He indicated that his father is alive. He indicated that all of his three sisters are alive. He indicated that his brother is alive. He indicated that his paternal grandfather is alive. He indicated that the status of his maternal uncle is unknown. He indicated that the status of his neg hx is unknown.  Social History    Social  History   Socioeconomic History  . Marital status: Married    Spouse name: [name]  . Number of children: 3  . Years of education: Not on file  . Highest education level: High school graduate  Occupational History  . Occupation: Secretary/administrator - laid off  Social Needs  . Financial resource strain: Not hard at all  . Food insecurity    Worry: Never true    Inability: Never true  . Transportation needs    Medical: No    Non-medical: No  Tobacco Use  . Smoking status: Former Smoker    Packs/day: 0.00    Years: 20.00    Pack years: 0.00    Quit date: 06/2019    Years since quitting: 0.2  . Smokeless tobacco: Never Used  Substance and Sexual Activity  . Alcohol use: Not Currently    Comment: 2-3 beers a day; stopped with 7/20 hospitalization  . Drug use: Yes    Types: Marijuana, Cocaine, Benzodiazepines    Comment: denies use since 7/20 hospitalization  . Sexual  activity: Not Currently    Partners: Female  Lifestyle  . Physical activity    Days per week: 0 days    Minutes per session: 0 min  . Stress: Not at all  Relationships  . Social connections    Talks on phone: More than three times a week    Gets together: Never    Attends religious service: 1 to 4 times per year    Active member of club or organization: No    Attends meetings of clubs or organizations: Never    Relationship status: Married  . Intimate partner violence    Fear of current or ex partner: No    Emotionally abused: No    Physically abused: No    Forced sexual activity: No  Other Topics Concern  . Not on file  Social History Narrative  . Not on file     Review of Systems    General:  No chills, fever, night sweats or weight changes.  Cardiovascular:  No chest pain, dyspnea on exertion, edema, orthopnea, palpitations, paroxysmal nocturnal dyspnea. Dermatological: No rash, lesions/masses Respiratory: No cough, dyspnea Urologic: No hematuria, dysuria Abdominal:   No nausea, vomiting, diarrhea,  bright red blood per rectum, melena, or hematemesis Neurologic:  No visual changes, wkns, changes in mental status. All other systems reviewed and are otherwise negative except as noted above.  Physical Exam    VS:  BP (!) 194/105   Pulse 93   Temp 98.8 F (37.1 C) (Temporal)   Ht 5\' 9"  (1.753 m)   Wt 172 lb (78 kg)   SpO2 100%   BMI 25.40 kg/m  , BMI Body mass index is 25.4 kg/m. GEN: Well nourished, well developed, in no acute distress. HEENT: normal. Neck: Supple, no JVD, carotid bruits, or masses. Cardiac: RRR, no murmurs, rubs, or gallops. No clubbing, cyanosis, edema.  Radials/DP/PT 2+ and equal bilaterally.  Respiratory:  Respirations regular and unlabored, clear to auscultation bilaterally. GI: Soft, nontender, nondistended, BS + x 4. MS: no deformity or atrophy. Skin: warm and dry, no rash. Neuro:  Strength and sensation are intact. Psych: Normal affect.  Accessory Clinical Findings    ECG personally reviewed by me today-none today.  EKG 08/21/2019 Sinus tachycardia 122 bpm  Echocardiogram 06/22/2019  1. The left ventricle has hyperdynamic systolic function, with an ejection fraction of >65%. The cavity size was decreased. There is severe concentric left ventricular hypertrophy. Left ventricular diastolic parameters were normal. No evidence of left  ventricular regional wall motion abnormalities. 2. The right ventricle has normal systolic function. The cavity was normal. There is no increase in right ventricular wall thickness. Right ventricular systolic pressure could not be assessed. 3. Small pericardial effusion. 4. No evidence of mitral valve stenosis. 5. The aortic valve is tricuspid. No stenosis of the aortic valve.  Assessment & Plan   Essential hypertension-BP today 194/105.  Better controlled at home but still hypertensive with systolic blood pressures in the Q000111Q and diastolic blood pressures in the 80s.  He is still struggling with medication  compliance given his dialysis Monday Wednesday Friday schedule. Continue amlodipine 10 mg tablet daily Continue carvedilol 25 mg tablet twice daily- spoke with clinic pharmacist and will consider moving carvedilol to 50 mg twice daily in the future for better blood pressure control. Continue hydralazine 100 mg tablet 3 times daily-educated about importance of medication compliance and middle of the day dose. Continue minoxidil 5 mg tablet twice daily Start taking Spironolactone 25 mg tablet  daily- patient and his girlfriend were confused about this medication and had stopped taking in early September. Follow low-sodium renal/heart healthy diet-salty 6 given Increase physical activity as tolerated Maintain blood pressure log and bring to next appointment with pill bottles. Patient and girlfriend educated about risk for CVA, continued renal damage, and developing problems with eyesight due to hypertension.  Hyperlipidemia-7/10/2020Cholesterol 253; HDL 67; LDL Cholesterol 158; Triglycerides 142; VLDL 28 Continue atorvastatin 40 mg tablet daily  All medications thoroughly reviewed.  All patient questions answered.  He expressed understanding and has no concerns about his medication at this time.  Disposition: Follow-up with APP in 2 weeks.  Follow-up BMP in 2 weeks.  Deberah Pelton, NP-C 09/21/2019, 4:23 PM

## 2019-09-27 ENCOUNTER — Ambulatory Visit: Payer: Medicaid Other | Admitting: Vascular Surgery

## 2019-10-06 ENCOUNTER — Ambulatory Visit: Payer: Medicaid Other | Admitting: Cardiology

## 2019-10-11 ENCOUNTER — Inpatient Hospital Stay (HOSPITAL_COMMUNITY): Payer: Medicaid Other

## 2019-10-11 ENCOUNTER — Other Ambulatory Visit: Payer: Self-pay

## 2019-10-11 ENCOUNTER — Emergency Department (HOSPITAL_COMMUNITY): Payer: Medicaid Other

## 2019-10-11 ENCOUNTER — Inpatient Hospital Stay (HOSPITAL_COMMUNITY)
Admission: EM | Admit: 2019-10-11 | Discharge: 2019-11-14 | DRG: 023 | Disposition: E | Payer: Medicaid Other | Attending: Neurology | Admitting: Neurology

## 2019-10-11 DIAGNOSIS — I1311 Hypertensive heart and chronic kidney disease without heart failure, with stage 5 chronic kidney disease, or end stage renal disease: Secondary | ICD-10-CM | POA: Diagnosis present

## 2019-10-11 DIAGNOSIS — Z833 Family history of diabetes mellitus: Secondary | ICD-10-CM

## 2019-10-11 DIAGNOSIS — Z20828 Contact with and (suspected) exposure to other viral communicable diseases: Secondary | ICD-10-CM | POA: Diagnosis present

## 2019-10-11 DIAGNOSIS — Z6827 Body mass index (BMI) 27.0-27.9, adult: Secondary | ICD-10-CM | POA: Diagnosis not present

## 2019-10-11 DIAGNOSIS — Z8673 Personal history of transient ischemic attack (TIA), and cerebral infarction without residual deficits: Secondary | ICD-10-CM | POA: Diagnosis not present

## 2019-10-11 DIAGNOSIS — G911 Obstructive hydrocephalus: Secondary | ICD-10-CM | POA: Diagnosis present

## 2019-10-11 DIAGNOSIS — E8770 Fluid overload, unspecified: Secondary | ICD-10-CM | POA: Diagnosis not present

## 2019-10-11 DIAGNOSIS — R609 Edema, unspecified: Secondary | ICD-10-CM

## 2019-10-11 DIAGNOSIS — K2971 Gastritis, unspecified, with bleeding: Secondary | ICD-10-CM | POA: Diagnosis not present

## 2019-10-11 DIAGNOSIS — R29810 Facial weakness: Secondary | ICD-10-CM | POA: Diagnosis present

## 2019-10-11 DIAGNOSIS — N186 End stage renal disease: Secondary | ICD-10-CM | POA: Diagnosis present

## 2019-10-11 DIAGNOSIS — G9341 Metabolic encephalopathy: Secondary | ICD-10-CM | POA: Diagnosis not present

## 2019-10-11 DIAGNOSIS — I6783 Posterior reversible encephalopathy syndrome: Secondary | ICD-10-CM | POA: Diagnosis present

## 2019-10-11 DIAGNOSIS — J9690 Respiratory failure, unspecified, unspecified whether with hypoxia or hypercapnia: Secondary | ICD-10-CM

## 2019-10-11 DIAGNOSIS — D62 Acute posthemorrhagic anemia: Secondary | ICD-10-CM | POA: Diagnosis not present

## 2019-10-11 DIAGNOSIS — Z992 Dependence on renal dialysis: Secondary | ICD-10-CM

## 2019-10-11 DIAGNOSIS — Z9911 Dependence on respirator [ventilator] status: Secondary | ICD-10-CM | POA: Diagnosis not present

## 2019-10-11 DIAGNOSIS — Z452 Encounter for adjustment and management of vascular access device: Secondary | ICD-10-CM

## 2019-10-11 DIAGNOSIS — Z8719 Personal history of other diseases of the digestive system: Secondary | ICD-10-CM

## 2019-10-11 DIAGNOSIS — I615 Nontraumatic intracerebral hemorrhage, intraventricular: Secondary | ICD-10-CM | POA: Diagnosis present

## 2019-10-11 DIAGNOSIS — L8990 Pressure ulcer of unspecified site, unspecified stage: Secondary | ICD-10-CM | POA: Insufficient documentation

## 2019-10-11 DIAGNOSIS — R195 Other fecal abnormalities: Secondary | ICD-10-CM | POA: Diagnosis present

## 2019-10-11 DIAGNOSIS — Z79899 Other long term (current) drug therapy: Secondary | ICD-10-CM

## 2019-10-11 DIAGNOSIS — K922 Gastrointestinal hemorrhage, unspecified: Secondary | ICD-10-CM | POA: Diagnosis present

## 2019-10-11 DIAGNOSIS — I161 Hypertensive emergency: Secondary | ICD-10-CM | POA: Diagnosis present

## 2019-10-11 DIAGNOSIS — R0602 Shortness of breath: Secondary | ICD-10-CM

## 2019-10-11 DIAGNOSIS — D631 Anemia in chronic kidney disease: Secondary | ICD-10-CM | POA: Diagnosis present

## 2019-10-11 DIAGNOSIS — Z66 Do not resuscitate: Secondary | ICD-10-CM | POA: Diagnosis not present

## 2019-10-11 DIAGNOSIS — Z888 Allergy status to other drugs, medicaments and biological substances status: Secondary | ICD-10-CM

## 2019-10-11 DIAGNOSIS — R509 Fever, unspecified: Secondary | ICD-10-CM

## 2019-10-11 DIAGNOSIS — E875 Hyperkalemia: Secondary | ICD-10-CM | POA: Diagnosis not present

## 2019-10-11 DIAGNOSIS — E785 Hyperlipidemia, unspecified: Secondary | ICD-10-CM | POA: Diagnosis present

## 2019-10-11 DIAGNOSIS — R4781 Slurred speech: Secondary | ICD-10-CM | POA: Diagnosis present

## 2019-10-11 DIAGNOSIS — E440 Moderate protein-calorie malnutrition: Secondary | ICD-10-CM | POA: Diagnosis present

## 2019-10-11 DIAGNOSIS — Z9289 Personal history of other medical treatment: Secondary | ICD-10-CM

## 2019-10-11 DIAGNOSIS — E870 Hyperosmolality and hypernatremia: Secondary | ICD-10-CM | POA: Diagnosis not present

## 2019-10-11 DIAGNOSIS — K921 Melena: Secondary | ICD-10-CM | POA: Diagnosis not present

## 2019-10-11 DIAGNOSIS — D500 Iron deficiency anemia secondary to blood loss (chronic): Secondary | ICD-10-CM | POA: Diagnosis not present

## 2019-10-11 DIAGNOSIS — Z8249 Family history of ischemic heart disease and other diseases of the circulatory system: Secondary | ICD-10-CM

## 2019-10-11 DIAGNOSIS — F17210 Nicotine dependence, cigarettes, uncomplicated: Secondary | ICD-10-CM | POA: Diagnosis present

## 2019-10-11 DIAGNOSIS — J9601 Acute respiratory failure with hypoxia: Secondary | ICD-10-CM

## 2019-10-11 DIAGNOSIS — F1010 Alcohol abuse, uncomplicated: Secondary | ICD-10-CM | POA: Diagnosis present

## 2019-10-11 DIAGNOSIS — R1310 Dysphagia, unspecified: Secondary | ICD-10-CM | POA: Diagnosis present

## 2019-10-11 DIAGNOSIS — I610 Nontraumatic intracerebral hemorrhage in hemisphere, subcortical: Secondary | ICD-10-CM | POA: Diagnosis present

## 2019-10-11 DIAGNOSIS — R40243 Glasgow coma scale score 3-8, unspecified time: Secondary | ICD-10-CM | POA: Diagnosis present

## 2019-10-11 DIAGNOSIS — G936 Cerebral edema: Secondary | ICD-10-CM | POA: Diagnosis present

## 2019-10-11 DIAGNOSIS — Z515 Encounter for palliative care: Secondary | ICD-10-CM | POA: Diagnosis not present

## 2019-10-11 DIAGNOSIS — Z8042 Family history of malignant neoplasm of prostate: Secondary | ICD-10-CM

## 2019-10-11 DIAGNOSIS — E876 Hypokalemia: Secondary | ICD-10-CM | POA: Diagnosis present

## 2019-10-11 DIAGNOSIS — I10 Essential (primary) hypertension: Secondary | ICD-10-CM | POA: Diagnosis not present

## 2019-10-11 DIAGNOSIS — I619 Nontraumatic intracerebral hemorrhage, unspecified: Secondary | ICD-10-CM | POA: Diagnosis present

## 2019-10-11 DIAGNOSIS — Z7902 Long term (current) use of antithrombotics/antiplatelets: Secondary | ICD-10-CM

## 2019-10-11 DIAGNOSIS — G8194 Hemiplegia, unspecified affecting left nondominant side: Secondary | ICD-10-CM | POA: Diagnosis present

## 2019-10-11 DIAGNOSIS — H5704 Mydriasis: Secondary | ICD-10-CM | POA: Diagnosis present

## 2019-10-11 DIAGNOSIS — Z538 Procedure and treatment not carried out for other reasons: Secondary | ICD-10-CM | POA: Diagnosis present

## 2019-10-11 DIAGNOSIS — I629 Nontraumatic intracranial hemorrhage, unspecified: Secondary | ICD-10-CM

## 2019-10-11 DIAGNOSIS — F1410 Cocaine abuse, uncomplicated: Secondary | ICD-10-CM | POA: Diagnosis present

## 2019-10-11 DIAGNOSIS — R451 Restlessness and agitation: Secondary | ICD-10-CM | POA: Diagnosis not present

## 2019-10-11 LAB — COMPREHENSIVE METABOLIC PANEL
ALT: 16 U/L (ref 0–44)
AST: 20 U/L (ref 15–41)
Albumin: 4.2 g/dL (ref 3.5–5.0)
Alkaline Phosphatase: 70 U/L (ref 38–126)
Anion gap: 19 — ABNORMAL HIGH (ref 5–15)
BUN: 23 mg/dL — ABNORMAL HIGH (ref 6–20)
CO2: 24 mmol/L (ref 22–32)
Calcium: 9.9 mg/dL (ref 8.9–10.3)
Chloride: 93 mmol/L — ABNORMAL LOW (ref 98–111)
Creatinine, Ser: 11.17 mg/dL — ABNORMAL HIGH (ref 0.61–1.24)
GFR calc Af Amer: 6 mL/min — ABNORMAL LOW (ref >60)
GFR calc non Af Amer: 5 mL/min — ABNORMAL LOW (ref >60)
Glucose, Bld: 158 mg/dL — ABNORMAL HIGH (ref 70–99)
Potassium: 3.1 mmol/L — ABNORMAL LOW (ref 3.5–5.1)
Sodium: 136 mmol/L (ref 135–145)
Total Bilirubin: 0.8 mg/dL (ref 0.3–1.2)
Total Protein: 7.1 g/dL (ref 6.5–8.1)

## 2019-10-11 LAB — BASIC METABOLIC PANEL
BUN/Creatinine Ratio: 2 — ABNORMAL LOW (ref 9–20)
BUN: 20 mg/dL (ref 6–20)
CO2: 21 mmol/L (ref 20–29)
Calcium: 10.1 mg/dL (ref 8.7–10.2)
Chloride: 94 mmol/L — ABNORMAL LOW (ref 96–106)
Creatinine, Ser: 9.56 mg/dL — ABNORMAL HIGH (ref 0.76–1.27)
GFR calc Af Amer: 7 mL/min/{1.73_m2} — ABNORMAL LOW (ref >59)
GFR calc non Af Amer: 6 mL/min/{1.73_m2} — ABNORMAL LOW (ref >59)
Glucose: 89 mg/dL (ref 65–99)
Potassium: 3.6 mmol/L (ref 3.5–5.2)
Sodium: 141 mmol/L (ref 134–144)

## 2019-10-11 LAB — CBC
HCT: 30.6 % — ABNORMAL LOW (ref 39.0–52.0)
Hemoglobin: 9.6 g/dL — ABNORMAL LOW (ref 13.0–17.0)
MCH: 29.3 pg (ref 26.0–34.0)
MCHC: 31.4 g/dL (ref 30.0–36.0)
MCV: 93.3 fL (ref 80.0–100.0)
Platelets: 387 10*3/uL (ref 150–400)
RBC: 3.28 MIL/uL — ABNORMAL LOW (ref 4.22–5.81)
RDW: 15.7 % — ABNORMAL HIGH (ref 11.5–15.5)
WBC: 7.1 10*3/uL (ref 4.0–10.5)
nRBC: 0 % (ref 0.0–0.2)

## 2019-10-11 LAB — POCT I-STAT 7, (LYTES, BLD GAS, ICA,H+H)
Acid-Base Excess: 6 mmol/L — ABNORMAL HIGH (ref 0.0–2.0)
Bicarbonate: 30.7 mmol/L — ABNORMAL HIGH (ref 20.0–28.0)
Calcium, Ion: 1.14 mmol/L — ABNORMAL LOW (ref 1.15–1.40)
HCT: 27 % — ABNORMAL LOW (ref 39.0–52.0)
Hemoglobin: 9.2 g/dL — ABNORMAL LOW (ref 13.0–17.0)
O2 Saturation: 100 %
Potassium: 3.2 mmol/L — ABNORMAL LOW (ref 3.5–5.1)
Sodium: 133 mmol/L — ABNORMAL LOW (ref 135–145)
TCO2: 32 mmol/L (ref 22–32)
pCO2 arterial: 43.7 mmHg (ref 32.0–48.0)
pH, Arterial: 7.455 — ABNORMAL HIGH (ref 7.350–7.450)
pO2, Arterial: 239 mmHg — ABNORMAL HIGH (ref 83.0–108.0)

## 2019-10-11 LAB — DIFFERENTIAL
Abs Immature Granulocytes: 0.03 10*3/uL (ref 0.00–0.07)
Basophils Absolute: 0 10*3/uL (ref 0.0–0.1)
Basophils Relative: 1 %
Eosinophils Absolute: 0.6 10*3/uL — ABNORMAL HIGH (ref 0.0–0.5)
Eosinophils Relative: 8 %
Immature Granulocytes: 0 %
Lymphocytes Relative: 28 %
Lymphs Abs: 2 10*3/uL (ref 0.7–4.0)
Monocytes Absolute: 0.6 10*3/uL (ref 0.1–1.0)
Monocytes Relative: 8 %
Neutro Abs: 3.9 10*3/uL (ref 1.7–7.7)
Neutrophils Relative %: 55 %

## 2019-10-11 LAB — RAPID URINE DRUG SCREEN, HOSP PERFORMED
Amphetamines: NOT DETECTED
Barbiturates: NOT DETECTED
Benzodiazepines: NOT DETECTED
Cocaine: NOT DETECTED
Opiates: NOT DETECTED
Tetrahydrocannabinol: NOT DETECTED

## 2019-10-11 LAB — I-STAT CHEM 8, ED
BUN: 24 mg/dL — ABNORMAL HIGH (ref 6–20)
Calcium, Ion: 1.13 mmol/L — ABNORMAL LOW (ref 1.15–1.40)
Chloride: 96 mmol/L — ABNORMAL LOW (ref 98–111)
Creatinine, Ser: 11.1 mg/dL — ABNORMAL HIGH (ref 0.61–1.24)
Glucose, Bld: 152 mg/dL — ABNORMAL HIGH (ref 70–99)
HCT: 32 % — ABNORMAL LOW (ref 39.0–52.0)
Hemoglobin: 10.9 g/dL — ABNORMAL LOW (ref 13.0–17.0)
Potassium: 3.1 mmol/L — ABNORMAL LOW (ref 3.5–5.1)
Sodium: 135 mmol/L (ref 135–145)
TCO2: 27 mmol/L (ref 22–32)

## 2019-10-11 LAB — PROTIME-INR
INR: 1.2 (ref 0.8–1.2)
Prothrombin Time: 14.7 seconds (ref 11.4–15.2)

## 2019-10-11 LAB — SODIUM
Sodium: 139 mmol/L (ref 135–145)
Sodium: 140 mmol/L (ref 135–145)

## 2019-10-11 LAB — CBG MONITORING, ED: Glucose-Capillary: 139 mg/dL — ABNORMAL HIGH (ref 70–99)

## 2019-10-11 LAB — MRSA PCR SCREENING: MRSA by PCR: NEGATIVE

## 2019-10-11 LAB — GLUCOSE, CAPILLARY
Glucose-Capillary: 105 mg/dL — ABNORMAL HIGH (ref 70–99)
Glucose-Capillary: 113 mg/dL — ABNORMAL HIGH (ref 70–99)

## 2019-10-11 LAB — TYPE AND SCREEN
ABO/RH(D): O POS
Antibody Screen: NEGATIVE

## 2019-10-11 LAB — APTT: aPTT: 36 seconds (ref 24–36)

## 2019-10-11 LAB — SARS CORONAVIRUS 2 BY RT PCR (HOSPITAL ORDER, PERFORMED IN ~~LOC~~ HOSPITAL LAB): SARS Coronavirus 2: NEGATIVE

## 2019-10-11 MED ORDER — POTASSIUM CHLORIDE 20 MEQ PO PACK
40.0000 meq | PACK | Freq: Two times a day (BID) | ORAL | Status: DC
  Administered 2019-10-11: 40 meq via ORAL
  Filled 2019-10-11 (×2): qty 2

## 2019-10-11 MED ORDER — LABETALOL HCL 5 MG/ML IV SOLN
20.0000 mg | INTRAVENOUS | Status: DC | PRN
  Administered 2019-10-11 – 2019-10-15 (×19): 20 mg via INTRAVENOUS
  Filled 2019-10-11 (×8): qty 4

## 2019-10-11 MED ORDER — ROCURONIUM BROMIDE 50 MG/5ML IV SOLN
INTRAVENOUS | Status: AC | PRN
  Administered 2019-10-11: 80 mg via INTRAVENOUS

## 2019-10-11 MED ORDER — SODIUM CHLORIDE 0.9 % IV SOLN
20.0000 ug | Freq: Once | INTRAVENOUS | Status: AC
  Administered 2019-10-11: 20 ug via INTRAVENOUS
  Filled 2019-10-11: qty 5

## 2019-10-11 MED ORDER — CARVEDILOL 12.5 MG PO TABS
25.0000 mg | ORAL_TABLET | Freq: Two times a day (BID) | ORAL | Status: DC
  Administered 2019-10-12 – 2019-10-26 (×25): 25 mg
  Filled 2019-10-11 (×27): qty 2

## 2019-10-11 MED ORDER — PROPOFOL 1000 MG/100ML IV EMUL
5.0000 ug/kg/min | INTRAVENOUS | Status: DC
  Administered 2019-10-11: 06:00:00 10 ug/kg/min via INTRAVENOUS
  Administered 2019-10-11 – 2019-10-12 (×4): 50 ug/kg/min via INTRAVENOUS
  Administered 2019-10-12: 17:00:00 25 ug/kg/min via INTRAVENOUS
  Administered 2019-10-13: 18:00:00 30 ug/kg/min via INTRAVENOUS
  Administered 2019-10-13 – 2019-10-15 (×2): 25 ug/kg/min via INTRAVENOUS
  Administered 2019-10-15: 30 ug/kg/min via INTRAVENOUS
  Filled 2019-10-11 (×5): qty 100
  Filled 2019-10-11 (×2): qty 200
  Filled 2019-10-11 (×6): qty 100

## 2019-10-11 MED ORDER — CLEVIDIPINE BUTYRATE 0.5 MG/ML IV EMUL
0.0000 mg/h | INTRAVENOUS | Status: DC
  Administered 2019-10-11: 22:00:00 21 mg/h via INTRAVENOUS
  Administered 2019-10-11: 06:00:00 1 mg/h via INTRAVENOUS
  Administered 2019-10-11: 21 mg/h via INTRAVENOUS
  Administered 2019-10-11: 06:00:00 1 mg/h via INTRAVENOUS
  Administered 2019-10-11 – 2019-10-12 (×5): 21 mg/h via INTRAVENOUS
  Administered 2019-10-12: 17 mg/h via INTRAVENOUS
  Administered 2019-10-12: 9 mg/h via INTRAVENOUS
  Administered 2019-10-12: 11:00:00 21 mg/h via INTRAVENOUS
  Administered 2019-10-12 (×2): 19 mg/h via INTRAVENOUS
  Administered 2019-10-12 – 2019-10-13 (×2): 13 mg/h via INTRAVENOUS
  Administered 2019-10-13: 01:00:00 20 mg/h via INTRAVENOUS
  Administered 2019-10-13 (×2): 21 mg/h via INTRAVENOUS
  Administered 2019-10-13: 18 mg/h via INTRAVENOUS
  Administered 2019-10-13: 05:00:00 21 mg/h via INTRAVENOUS
  Administered 2019-10-14: 02:00:00 20 mg/h via INTRAVENOUS
  Administered 2019-10-14: 4 mg/h via INTRAVENOUS
  Administered 2019-10-14 (×2): 21 mg/h via INTRAVENOUS
  Filled 2019-10-11: qty 100
  Filled 2019-10-11 (×2): qty 50
  Filled 2019-10-11: qty 100
  Filled 2019-10-11 (×4): qty 50
  Filled 2019-10-11: qty 100
  Filled 2019-10-11 (×2): qty 50
  Filled 2019-10-11 (×2): qty 100
  Filled 2019-10-11 (×3): qty 50
  Filled 2019-10-11: qty 100
  Filled 2019-10-11: qty 50
  Filled 2019-10-11: qty 100
  Filled 2019-10-11 (×6): qty 50
  Filled 2019-10-11: qty 100

## 2019-10-11 MED ORDER — STROKE: EARLY STAGES OF RECOVERY BOOK
Freq: Once | Status: AC
  Administered 2019-10-11: 1
  Filled 2019-10-11: qty 1

## 2019-10-11 MED ORDER — PROPOFOL 1000 MG/100ML IV EMUL
INTRAVENOUS | Status: AC
  Filled 2019-10-11: qty 100

## 2019-10-11 MED ORDER — CLEVIDIPINE BUTYRATE 0.5 MG/ML IV EMUL
INTRAVENOUS | Status: AC
  Administered 2019-10-11: 06:00:00 1 mg/h via INTRAVENOUS
  Filled 2019-10-11: qty 50

## 2019-10-11 MED ORDER — ETOMIDATE 2 MG/ML IV SOLN
INTRAVENOUS | Status: AC | PRN
  Administered 2019-10-11: 30 mg via INTRAVENOUS

## 2019-10-11 MED ORDER — LABETALOL HCL 5 MG/ML IV SOLN
INTRAVENOUS | Status: AC
  Filled 2019-10-11: qty 4

## 2019-10-11 MED ORDER — PROPOFOL 10 MG/ML IV BOLUS
INTRAVENOUS | Status: AC | PRN
  Administered 2019-10-11: 20 mg via INTRAVENOUS

## 2019-10-11 MED ORDER — SODIUM CHLORIDE 0.9% FLUSH
3.0000 mL | Freq: Once | INTRAVENOUS | Status: AC
Start: 2019-10-11 — End: 2019-10-11
  Administered 2019-10-11: 11:00:00 3 mL via INTRAVENOUS

## 2019-10-11 MED ORDER — SODIUM CHLORIDE 23.4 % INJECTION (4 MEQ/ML) FOR IV ADMINISTRATION
120.0000 meq | Freq: Once | INTRAVENOUS | Status: AC
  Administered 2019-10-11: 120 meq via INTRAVENOUS
  Filled 2019-10-11: qty 30

## 2019-10-11 MED ORDER — HYDRALAZINE HCL 20 MG/ML IJ SOLN
10.0000 mg | Freq: Once | INTRAMUSCULAR | Status: AC
  Administered 2019-10-11: 10 mg via INTRAVENOUS

## 2019-10-11 MED ORDER — SENNOSIDES-DOCUSATE SODIUM 8.6-50 MG PO TABS
1.0000 | ORAL_TABLET | Freq: Two times a day (BID) | ORAL | Status: DC
  Administered 2019-10-11 – 2019-10-24 (×17): 1
  Filled 2019-10-11 (×16): qty 1

## 2019-10-11 MED ORDER — DOCUSATE SODIUM 50 MG/5ML PO LIQD
100.0000 mg | Freq: Two times a day (BID) | ORAL | Status: DC | PRN
  Filled 2019-10-11 (×2): qty 10

## 2019-10-11 MED ORDER — CHLORHEXIDINE GLUCONATE 0.12% ORAL RINSE (MEDLINE KIT)
15.0000 mL | Freq: Two times a day (BID) | OROMUCOSAL | Status: DC
  Administered 2019-10-11 – 2019-10-26 (×30): 15 mL via OROMUCOSAL

## 2019-10-11 MED ORDER — ACETAMINOPHEN 325 MG PO TABS
650.0000 mg | ORAL_TABLET | ORAL | Status: DC | PRN
  Administered 2019-10-11: 650 mg
  Filled 2019-10-11: qty 2

## 2019-10-11 MED ORDER — ORAL CARE MOUTH RINSE
15.0000 mL | OROMUCOSAL | Status: DC
  Administered 2019-10-11 – 2019-10-26 (×146): 15 mL via OROMUCOSAL

## 2019-10-11 MED ORDER — NICARDIPINE HCL IN NACL 40-0.83 MG/200ML-% IV SOLN: 3.0000 mg/h | INTRAVENOUS | Status: DC

## 2019-10-11 MED ORDER — PROPOFOL 1000 MG/100ML IV EMUL: INTRAVENOUS | Status: AC | PRN

## 2019-10-11 MED ORDER — ACETAMINOPHEN 325 MG PO TABS
650.0000 mg | ORAL_TABLET | Freq: Four times a day (QID) | ORAL | Status: DC
  Administered 2019-10-11 – 2019-10-14 (×13): 650 mg
  Filled 2019-10-11 (×13): qty 2

## 2019-10-11 MED ORDER — CARVEDILOL 12.5 MG PO TABS: 25.0000 mg | ORAL_TABLET | Freq: Two times a day (BID) | ORAL | Status: DC

## 2019-10-11 MED ORDER — CLEVIDIPINE BUTYRATE 0.5 MG/ML IV EMUL
INTRAVENOUS | Status: AC
  Administered 2019-10-11: 21 mg/h via INTRAVENOUS
  Filled 2019-10-11: qty 50

## 2019-10-11 MED ORDER — PROPOFOL 1000 MG/100ML IV EMUL
10.0000 ug/kg/min | INTRAVENOUS | Status: DC
  Administered 2019-10-11: 06:00:00 10 ug/kg/min via INTRAVENOUS

## 2019-10-11 MED ORDER — CHLORHEXIDINE GLUCONATE CLOTH 2 % EX PADS
6.0000 | MEDICATED_PAD | Freq: Every day | CUTANEOUS | Status: DC
  Administered 2019-10-11 – 2019-10-13 (×3): 6 via TOPICAL

## 2019-10-11 MED ORDER — SENNOSIDES-DOCUSATE SODIUM 8.6-50 MG PO TABS
1.0000 | ORAL_TABLET | Freq: Two times a day (BID) | ORAL | Status: DC
  Filled 2019-10-11 (×2): qty 1

## 2019-10-11 MED ORDER — NICARDIPINE HCL IN NACL 20-0.86 MG/200ML-% IV SOLN: 3.0000 mg/h | INTRAVENOUS | Status: DC

## 2019-10-11 MED ORDER — AMLODIPINE BESYLATE 10 MG PO TABS: 10.0000 mg | ORAL_TABLET | Freq: Every day | ORAL | Status: DC

## 2019-10-11 MED ORDER — PROPOFOL 10 MG/ML IV BOLUS
INTRAVENOUS | Status: AC
  Filled 2019-10-11: qty 20

## 2019-10-11 MED ORDER — POTASSIUM CHLORIDE 20 MEQ PO PACK
40.0000 meq | PACK | Freq: Two times a day (BID) | ORAL | Status: AC
  Administered 2019-10-12 (×2): 40 meq
  Filled 2019-10-11 (×2): qty 2

## 2019-10-11 MED ORDER — HYDRALAZINE HCL 20 MG/ML IJ SOLN
20.0000 mg | Freq: Once | INTRAMUSCULAR | Status: AC
  Administered 2019-10-11: 20 mg via INTRAVENOUS

## 2019-10-11 MED ORDER — PANTOPRAZOLE SODIUM 40 MG IV SOLR
40.0000 mg | Freq: Every day | INTRAVENOUS | Status: DC
  Administered 2019-10-11 – 2019-10-12 (×2): 40 mg via INTRAVENOUS
  Filled 2019-10-11 (×2): qty 40

## 2019-10-11 NOTE — ED Notes (Signed)
Central line being placed at bed side.

## 2019-10-11 NOTE — H&P (Signed)
Chief Complaint:  Sudden onset facial droop, slurred speech and left side weakness  History obtained from: Patient and Chart     HPI:                                                                                                                                       Arhaan Macewan is a 33 y.o. male with PMH of HTN, ESRD on dialysis, HLD, prior cocaine abuse with prior stroke with no deficits. Patient woke up at 4.45 am feeling short of breath and woke up his wife, she noticed that his speech become slurred and facial droop. He become worse in front of her. EMS arrived at the scene and patient had right gaze, and left side weakness. BP 200/120 mmHg.  On arrival, patient obtunded, not following commands. Moving right side spontaneously and with laboured respirations. Stat CT obtained shows large R BG hemorrhage with midline shift, IVH and mild hydrocephalus. Patient on ASA and Plavix at home.    Patient intubated for airway, labetalol and cleviprex started for BP control. NS consulted immediately. DDAVP ordered as patient on plavix and aspirin.    Date last known well: 10.28.20 Time last known well: 4.45 am tPA Given: no, hemorrage NIHSS:  Baseline MRS 0  ICH score 4   Past Medical History:  Diagnosis Date  . Anemia   . Drug abuse, cocaine type ([place]) 06/23/2019  . ESRD (end stage renal disease) ([place]) 08/08/2019  . Hyperlipidemia   . Hypertension   . Stroke [facility]) 06/2019    Past Surgical History:  Procedure Laterality Date  . AV FISTULA PLACEMENT Right 08/15/2019   Procedure: INSERTION OF ARTERIOVENOUS (AV) GORE-TEX GRAFT ARM. Using 4-3mm STRETCH Goretex.;  Surgeon: Angelia Mould, MD;  Location: [facility] OR;  Service: Vascular;  Laterality: Right;  . BIOPSY  08/27/2019   Procedure: BIOPSY;  Surgeon: Ronnette Juniper, MD;  Location: [facility] ENDOSCOPY;  Service: Gastroenterology;;  . ESOPHAGOGASTRODUODENOSCOPY (EGD) WITH PROPOFOL N/A 08/27/2019   Procedure: ESOPHAGOGASTRODUODENOSCOPY  (EGD) WITH PROPOFOL;  Surgeon: Ronnette Juniper, MD;  Location: [place];  Service: Gastroenterology;  Laterality: N/A;  . INSERTION OF DIALYSIS CATHETER Right 08/08/2019   Procedure: INSERTION OF right internal jugular DIALYSIS CATHETER;  Surgeon: Angelia Mould, MD;  Location: [place];  Service: Vascular;  Laterality: Right;  . RADIOLOGY WITH ANESTHESIA N/A 06/23/2019   Procedure: MRI OF VESSELS;  Surgeon: Radiologist, Medication, MD;  Location: [place];  Service: Radiology;  Laterality: N/A;    Family History  Problem Relation Age of Onset  . Diabetes Sister   . Diabetes Maternal Uncle   . Hypertension Father   . Hypertension Paternal Grandfather   . Prostate cancer Paternal Grandfather   . Diabetes Sister   . Renal Disease Neg Hx    Social History:  reports that he quit smoking about 3 months ago. He smoked 0.00 packs per day for 20.00 years.  He has never used smokeless tobacco. He reports previous alcohol use. He reports current drug use. Drugs: Marijuana, Cocaine, and Benzodiazepines.  Allergies:  Allergies  Allergen Reactions  . Reglan [Metoclopramide] Nausea And Vomiting    Develops akathisia    Medications:                                                                                                                        I reviewed home medications   ROS:                                                                                                                                     Unable to obtain due to mental status   Examination:                                                                                                      General: Appears well-developed  Psych: Affect appropriate to situation Eyes: No scleral injection HENT: No OP obstrucion Head: Normocephalic.  Cardiovascular: Normal rate and regular rhythm. Respiratory: Effort normal and breath sounds normal to anterior ascultation GI: Soft.  No distension. There is no tenderness.  Skin: WDI     Neurological Examination Mental Status: Obtunded, not following commands and non verbal Cranial Nerves: II: Visual fields reduced blink to threat on the left side III,IV, VI: ptosis not present, forced gaze deviation to the right reactive to light and accommodation VII: Left facial droop present XII: midline tongue extension, gag reflex present Motor: Right : Upper extremity   5/5    Left:     Upper extremity   1/5  Lower extremity   5/5     Lower extremity   1/5 Tone and bulk:normal tone throughout; no atrophy noted Sensory: Unable to assess due to mental status Plantars: Right: downgoing   Left: downgoing Cerebellar: Unable to accurately assess, no gross ataxia     Lab Results: Basic Metabolic Panel: Recent Labs  Lab 10/06/19 0820  NA 141  K 3.6  CL 94*  CO2 21  GLUCOSE 89  BUN 20  CREATININE 9.56*  CALCIUM 10.1    CBC: No results for input(s): WBC, NEUTROABS, HGB, HCT, MCV, PLT in the last 168 hours.  Coagulation Studies: No results for input(s): LABPROT, INR in the last 72 hours.  Imaging: No results found.   ASSESSMENT AND PLAN   33 year old male with past medical history of hypertension, end-stage renal disease on Plavix for recent stroke presents to the emergency department obtunded with left-sided plegia and right gaze deviation.  CT head showed 60 cc hematoma in the right basal ganglia with intraventricular extension, hydrocephalus, and 1 cm of midline shift.   Patient was intubated mainly for airway protection.  However started with goal blood pressure less than [id number] systolic, brief period where blood pressure was outside limits following intubation and while attempting to get arterial line placed.  Patient also was given desmopressin given the fact that he is using Plavix with small studies supporting its use in the setting of antiplatelets.  Neurosurgery consulted and are planning for EVD placement.   Acute Right Basal ganglia Hemorrhage -Likely  hypertensive etiology -Hold antiplatelets/anticoagulants -Admission to neuro ICU -Frequent neurochecks -Follow-up on neurosurgery recommendations, may consider repeat CT head if EVD placed. Discussed minimally invasive evacuation, however given end-stage renal disease and possible coagulopathy not felt to be an ideal candidate -BP less than 140/90 mmHg, continue Cleviprex  Acute respiratory failure secondary to above -Intubated for airway protection, appreciate PCCM assistance of ventilator management  Hypertensive crisis -Patient started om Cleviprex, titrated to achieve goal less than [id number] systolic  End-stage renal disease -Nephrology will need to be consulted     This patient is neurologically critically ill due to [employer] with intraventricular hemorrhage and hydrocephalus.  He is at risk for significant risk of neurological worsening from cerebral edema,  death from brain herniation, heart failure, increased size of hemorrhage, infection, respiratory failure and seizure. This patient's care requires constant monitoring of vital signs, hemodynamics, respiratory and cardiac monitoring, review of multiple databases, neurological assessment, discussion with family, other specialists and medical decision making of high complexity.  I spent 70  minutes of neurocritical time in the care of this patient.     Willian Donson [facility] Pager Number [phone]

## 2019-10-11 NOTE — ED Notes (Signed)
Pt remains sedated on propofol Neurosurgeon in room inserting drain

## 2019-10-11 NOTE — Procedures (Signed)
Central Venous Catheter Insertion Procedure Note William [name] [id number] 06/23/1986  Procedure: Insertion of Central Venous Catheter Indications: Assessment of intravascular volume, Drug and/or fluid administration and Frequent blood sampling  Procedure Details Consent: Risks of procedure as well as the alternatives and risks of each were explained to the (patient/caregiver).  Consent for procedure obtained. Time Out: Verified patient identification, verified procedure, site/side was marked, verified correct patient position, special equipment/implants available, medications/allergies/relevent history reviewed, required imaging and test results available.  Performed  Maximum sterile technique was used including antiseptics, cap, gloves, gown, hand hygiene, mask and sheet. Skin prep: Chlorhexidine; local anesthetic administered A antimicrobial bonded/coated triple lumen catheter was placed in the left internal jugular vein using the Seldinger technique.  Evaluation Blood flow good Complications: No apparent complications Patient did tolerate procedure well. Chest X-ray ordered to verify placement.  CXR: pending.  Johnsie Cancel, NP-C [facility] After hours pager: [phone]. 09/30/2019, 11:41 AM

## 2019-10-11 NOTE — Progress Notes (Signed)
eLink Physician-Brief Progress Note Patient Name: William [name] DOB: 1986-02-17 MRN: [id number]   Date of Service  10/13/2019  HPI/Events of Note  33/M with ESRD and hypertension, brought in due to slurred speech and rapid deterioration of mental status requiring intubation.  Pt was found to have a large right basal ganglia bleed with mass-effect.  Neurosurgery placed EVD with minimal drainage.    eICU Interventions  Intracerebral hemorrhage Acute respiratory failure ESRD Hypertension  Plan> Keep intubated, control BP with SBP<140.  Maintain euglycemia.  SCDs fr DVT prophylaxis.  PPI for GI prophylaxis.     Intervention Category Evaluation Type: New Patient Evaluation  [name] 09/24/2019, 8:52 PM

## 2019-10-11 NOTE — ED Notes (Signed)
This RN called ED pharmacy to request more cleviprex for pt. Pharm tech said he would refill blue pyxis. Will call again at 1427 if blue pyxis not restocked. Will continue to monitor

## 2019-10-11 NOTE — ED Notes (Signed)
MD Agarwala aware sytolics are in the 123456, per him it is okay to stay in the 140's max.

## 2019-10-11 NOTE — Procedures (Signed)
PREOP DX: Hydrocephalus  POSTOP DX: Same  PROCEDURE: Right frontal ventriculostomy   SURGEON: Dr. Emelda Brothers, MD  ANESTHESIA: IV Sedation w/ propofol and Local anesthesia w/ lidocaine  EBL: 10cc  SPECIMENS: None  COMPLICATIONS: None  CONDITION: Hemodynamically stable  INDICATIONS: Mrs. William [name] is a 33 y.o. male that presented with acute obtundation, CTH showed a R ICH w/ IVH and obstructive hydrocephalus. Given the emergent nature of the problem with no family present at bedside, I elected to proceed with emergent EVD placement due to the life threatening nature of hydrocephalus.  PROCEDURE IN DETAIL: Skin of the R frontal scalp was clipped, prepped and draped in the usual sterile fashion.  Scalp was then infiltrated with local anesthetic with epinephrine.  Skin incision was made sharply, and twist drill burr hole was made.  The dura was then incised, and the ventricular catheter was passed in first attempt into the right lateral ventricle.  The catheter flowed well but clotted off within a few seconds. I was unable to get further CSF, so I removed the catheter and cleaned the clot out then replaced it down the same tract with good flow of bloody CSF. The catheter was then tunneled subcutaneously and connected to a drainage system and the skin incision closed.  The drain was then secured in place.  FINDINGS: 1. Opening pressure >20cm H2O 2. Bloody CSF

## 2019-10-11 NOTE — ED Notes (Signed)
Wife at bedside.

## 2019-10-11 NOTE — Procedures (Signed)
Arterial Catheter Insertion Procedure Note Bentyn Claborn [id number] 01/10/1986  Procedure: Insertion of Arterial Catheter  Indications: Blood pressure monitoring and Frequent blood sampling  Procedure Details Consent: Unable to obtain consent because of emergent medical necessity. Time Out: Verified patient identification, verified procedure, site/side was marked, verified correct patient position, special equipment/implants available, medications/allergies/relevent history reviewed, required imaging and test results available.  Performed  Maximum sterile technique was used including antiseptics, cap, gloves, gown, hand hygiene, mask and sheet. Skin prep: Chlorhexidine; local anesthetic administered 20 gauge catheter was inserted into left radial artery using the Seldinger technique. ULTRASOUND GUIDANCE USED: NO Evaluation Blood flow good; BP tracing good. Complications: No apparent complications.   Kelle Darting 10/02/2019 625

## 2019-10-11 NOTE — Progress Notes (Signed)
Chaplain provided spiritual presence and comfort to Mrs. William [name].  Chaplain sat with Mrs. William [name] as she processed aloud her concerns, needs, and questions.  Mrs. William [name] expressed that she wanted to be able to remain strong during this time and that she did not want her husband's best friend at this time to come to the consultation room because of him being overly emotional.  Chaplain spoke with husband's best friend about Mrs. William [name]'s wishes at this time. Chaplain worked to assure Mrs. William [name] that she was safe to grieve.   Chaplain was relieved by ED-Staff Chaplain.  Will follow-up as needed.

## 2019-10-11 NOTE — Progress Notes (Signed)
CTH reviewed, EVD is along normal frontal trajectory and enters the superior frontal horn. But, due to the midline shift it passes back through ependyma and terminates in parenchyma. Ventricles are mildly but not drastically increased in size. Given his ASA/plavix/ESRD PLT dysfunction, I think that replacement from the right side is going to result in multiple passes at an extreme trajectory, which increases risk. Placement on the left risks his remaining hemisphere, which is again an increased risk due to the platelet dysfunction. I agree that he likely has a very poor chance of functional outcome. I think that further attempts have a significant chance of harm without a likely chance of significant benefit. Will let the EVD remain for 24-48h to allow for hemostasis along the tract then remove it.

## 2019-10-11 NOTE — Consult Note (Signed)
Renal Service Consult Note [facility]  Phelan Kvam 09/17/2019 Sol [place] Requesting Physician:  Dr Lorraine Lax, S.  Reason for Consult:  ESRD pt w/ IC bleed HPI: The patient is a 33 y.o. year-old w/ hx of CVA, HTN, HL, cocaine use and ESRD on HD , hx of HTN/ PRES syndrome/ acute CVA in July 2020, started dialysis recently in Sept 2020.  Pt was brought to ED early this am w/ facial droop and slurred speech then L sided weakness. In ED BP's 180 / 137. Stat CT shoeed large R basal ganglia bleed w/ midline shift, IVH and mild hydrocephalus. Seen by neuro and Nsurg and EVD placed at bedside.  Given IV ddavp , Cleviprex and labetalol. Asked to see for ESRD.    Pt is unresponsive, no hx obtained from pt.    ROS - bn/a   Past Medical History  Past Medical History:  Diagnosis Date  . Anemia   . Drug abuse, cocaine type ([place]) 06/23/2019  . ESRD (end stage renal disease) ([place]) 08/08/2019  . Hyperlipidemia   . Hypertension   . Stroke [facility]) 06/2019   Past Surgical History  Past Surgical History:  Procedure Laterality Date  . AV FISTULA PLACEMENT Right 08/15/2019   Procedure: INSERTION OF ARTERIOVENOUS (AV) GORE-TEX GRAFT ARM. Using 4-55mm STRETCH Goretex.;  Surgeon: Angelia Mould, MD;  Location: [facility] OR;  Service: Vascular;  Laterality: Right;  . BIOPSY  08/27/2019   Procedure: BIOPSY;  Surgeon: Ronnette Juniper, MD;  Location: [facility] ENDOSCOPY;  Service: Gastroenterology;;  . ESOPHAGOGASTRODUODENOSCOPY (EGD) WITH PROPOFOL N/A 08/27/2019   Procedure: ESOPHAGOGASTRODUODENOSCOPY (EGD) WITH PROPOFOL;  Surgeon: Ronnette Juniper, MD;  Location: [place];  Service: Gastroenterology;  Laterality: N/A;  . INSERTION OF DIALYSIS CATHETER Right 08/08/2019   Procedure: INSERTION OF right internal jugular DIALYSIS CATHETER;  Surgeon: Angelia Mould, MD;  Location: [place];  Service: Vascular;  Laterality: Right;  . RADIOLOGY WITH ANESTHESIA N/A 06/23/2019   Procedure: MRI OF VESSELS;   Surgeon: Radiologist, Medication, MD;  Location: [place];  Service: Radiology;  Laterality: N/A;   Family History  Family History  Problem Relation Age of Onset  . Diabetes Sister   . Diabetes Maternal Uncle   . Hypertension Father   . Hypertension Paternal Grandfather   . Prostate cancer Paternal Grandfather   . Diabetes Sister   . Renal Disease Neg Hx    Social History  reports that he quit smoking about 3 months ago. He smoked 0.00 packs per day for 20.00 years. He has never used smokeless tobacco. He reports previous alcohol use. He reports current drug use. Drugs: Marijuana, Cocaine, and Benzodiazepines. Allergies  Allergies  Allergen Reactions  . Reglan [Metoclopramide] Nausea And Vomiting    Develops akathisia   Home medications Prior to Admission medications   Medication Sig Start Date End Date Taking? Authorizing Provider  amLODipine (NORVASC) 10 MG tablet Take 1 tablet (10 mg total) by mouth at bedtime. 07/08/19   Debbe Odea, MD  atorvastatin (LIPITOR) 40 MG tablet Take 1 tablet (40 mg total) by mouth daily at 6 PM. 06/29/19   Shelly Coss, MD  carvedilol (COREG) 25 MG tablet Take 1 tablet (25 mg total) by mouth 2 (two) times daily with a meal. 06/29/19   Shelly Coss, MD  chlorproMAZINE (THORAZINE) 10 MG tablet Take 1 tablet (10 mg total) by mouth 3 (three) times daily as needed for hiccoughs. 08/10/19   Shelly Coss, MD  clopidogrel (PLAVIX) 75 MG tablet Take  75 mg by mouth daily. 08/11/19   [provider]  folic acid-vitamin b complex-vitamin c-selenium-zinc (DIALYVITE) 3 MG TABS tablet Take 1 tablet by mouth daily.    [provider]  hydrALAZINE (APRESOLINE) 100 MG tablet Take 1 tablet (100 mg total) by mouth 3 (three) times daily with meals. 09/07/19   Deberah Pelton, NP  minoxidil (LONITEN) 2.5 MG tablet Take 2 tablets (5 mg total) by mouth 2 (two) times daily. 08/20/19   Debbe Odea, MD  nicotine (NICODERM CQ - DOSED IN MG/24 HR) 7 mg/24hr  patch Place 7 mg onto the skin daily.    [provider]  oxyCODONE-acetaminophen (PERCOCET) 5-325 MG tablet Take 1 tablet by mouth every 6 (six) hours as needed for severe pain. 08/27/19 08/26/20  Arrien, Jimmy Picket, MD  pantoprazole (PROTONIX) 40 MG tablet Take 1 tablet (40 mg total) by mouth daily. 08/27/19 09/26/19  Arrien, Jimmy Picket, MD  promethazine (PHENERGAN) 12.5 MG tablet TK 1 T PO TID WC PRF NAUSEA 09/13/19   [provider]  spironolactone (ALDACTONE) 25 MG tablet Take 25 mg by mouth daily.    [provider]  sucroferric oxyhydroxide (VELPHORO) 500 MG chewable tablet Chew 500 mg by mouth 3 (three) times daily with meals. 08/21/19   [provider]   Liver Function Tests Recent Labs  Lab 09/15/2019 0540  AST 20  ALT 16  ALKPHOS 70  BILITOT 0.8  PROT 7.1  ALBUMIN 4.2   No results for input(s): LIPASE, AMYLASE in the last 168 hours. CBC Recent Labs  Lab 10/05/2019 0540 09/20/2019 0613 10/09/2019 0843  WBC 7.1  --   --   NEUTROABS 3.9  --   --   HGB 9.6* 10.9* 9.2*  HCT 30.6* 32.0* 27.0*  MCV 93.3  --   --   PLT 387  --   --    Basic Metabolic Panel Recent Labs  Lab 10/06/19 0820 09/30/2019 0540 09/30/2019 0613 10/05/2019 0843  NA 141 136 135 133*  K 3.6 3.1* 3.1* 3.2*  CL 94* 93* 96*  --   CO2 21 24  --   --   GLUCOSE 89 158* 152*  --   BUN 20 23* 24*  --   CREATININE 9.56* 11.17* 11.10*  --   CALCIUM 10.1 9.9  --   --    Iron/TIBC/Ferritin/ %Sat    Component Value Date/Time   IRON 18 (L) 08/18/2019 2358   TIBC 239 (L) 08/18/2019 2358   FERRITIN 266 08/18/2019 2358   IRONPCTSAT 8 (L) 08/18/2019 2358    Vitals:   09/23/2019 0809 09/26/2019 0812 10/13/2019 0818 09/29/2019 0821  BP:      Pulse: 90 91 91 91  Resp: (!) 23 (!) 28 (!) 24 (!) 26  Temp: (!) 96.4 F (35.8 C) (!) 96.4 F (35.8 C) (!) 96.5 F (35.8 C) (!) 96.6 F (35.9 C)  TempSrc:      SpO2: 100% 100% 100% 100%  Weight:        Exam Gen unreponsive, intubated,  flexor posturing of UE's and LE's intermittently  No rash, cyanosis or gangrene Sclera anicteric, throat w/ ETT No jvd or bruits Chest clear bilat to bases, no rales or wheezing RRR no MRG Abd soft ntnd no mass or ascites +bs GU normal male MS no joint effusions or deformity Ext no LE or UE edema, no wounds or ulcers Neuro as above R TDC/ RUE AVG B+bruit    Home meds:  -  amlodipine 10/ carvedilol 25 bid/ hydralazine 100 tid/ minoxidil 2.5 bid/ spironolactone 25 qd  - pantoprazole 40  - atrovastatin 40/ clopidogrel 75 qd   - prn's/ vitamins/ supplements    Outpt HD: [place] MWF  4h 77min  73.5kg  2.2 bath  RU AVG (placed 08/15/19) / R TDC  Hep none  - darbe 200 /wk  - hect 3  - venofer 100 x 10 , completed     Assessment/ Plan: 1. Acute intracranial bleed - w/ midline shift and IVH. Per neuro/ NSurg. SP EVD 2. ESRD - usual HD MWF. Recent start in Sept 2020.  Has not missed dialysis. No vol or electrolyte issues, no acute indication for RRT. Will hold on dialysis for now. Will follow.   3. HTN - severe, on multiple meds at home 4. H/o CVA - July 2020   5. H/o substance abuse      Kelly Splinter  MD 09/18/2019, 10:00 AM

## 2019-10-11 NOTE — ED Notes (Signed)
Pt's family in waiting room requesting to see pt. Pt's wife at bedside, denied request to switch visitors.

## 2019-10-11 NOTE — ED Triage Notes (Signed)
Pt BIB [facility] from home, Code Stroke activated

## 2019-10-11 NOTE — Progress Notes (Addendum)
STROKE TEAM PROGRESS NOTE   HISTORY OF PRESENT ILLNESS (per record) William [name] is a 33 y.o. male with PMH of HTN, ESRD on dialysis, HLD, prior cocaine abuse (+UDS 06/22/19), prior stroke with no residual deficits. Pt presented on 10/28 feeling short of breath and woke up his wife, she noticed that his speech become slurred and face began to droop. EMS arrived at the scene and patient had right gaze, and left side weakness. BP 200/120 mmHg. Upon arrival to ER, patient obtunded, not following commands. Moving right side spontaneously and with laboured respirations.  Stat CT obtained shows large R BG hemorrhage with midline shift, IVH and mild hydrocephalus. Patient on ASA and Plavix at home.  Patient intubated for airway, labetalol and cleviprex started for BP control. NS consulted immediately. DDAVP ordered as patient on plavix and aspirin. ICH score 4.  INTERVAL HISTORY His wife is at the bedside, she is tearful. Neurosurgery placed Right EVD, There is no drainage at this time noted. Pts exam remains poor, GCS 4. UDS neg for cocaine at this time.  I have personally reviewed history of presenting illness with the patient's wife, electronic medical records as well as imaging films in PACS.  Patient has a longstanding history of hypertension which has been difficult to control.  He underwent emergent ventriculostomy but that has not been much return of CSF despite multiple attempts.  His blood pressure being controlled with Cleviprex drip OBJECTIVE Vitals:   10/13/2019 1315 09/21/2019 1400 09/15/2019 1415 10/06/2019 1422  BP:      Pulse: 97 94 96 94  Resp:      Temp:      TempSrc:      SpO2: 100% 100% 99% 100%  Weight:        CBC:  Recent Labs  Lab 09/14/2019 0540 10/06/2019 0613 10/05/2019 0843  WBC 7.1  --   --   NEUTROABS 3.9  --   --   HGB 9.6* 10.9* 9.2*  HCT 30.6* 32.0* 27.0*  MCV 93.3  --   --   PLT 387  --   --     Basic Metabolic Panel:  Recent Labs  Lab 10/06/19 0820  09/20/2019 0540  09/24/2019 0613 10/02/2019 0843  NA 141   < > 136 135 133*  K 3.6  --  3.1* 3.1* 3.2*  CL 94*  --  93* 96*  --   CO2 21  --  24  --   --   GLUCOSE 89  --  158* 152*  --   BUN 20  --  23* 24*  --   CREATININE 9.56*  --  11.17* 11.10*  --   CALCIUM 10.1  --  9.9  --   --    < > = values in this interval not displayed.    Lipid Panel:     Component Value Date/Time   CHOL 253 (H) 06/23/2019 0301   TRIG 142 06/23/2019 0301   HDL 67 06/23/2019 0301   CHOLHDL 3.8 06/23/2019 0301   VLDL 28 06/23/2019 0301   LDLCALC 158 (H) 06/23/2019 0301   HgbA1c:  Lab Results  Component Value Date   HGBA1C 4.9 06/23/2019   Urine Drug Screen:     Component Value Date/Time   LABOPIA NONE DETECTED 09/21/2019 0648   COCAINSCRNUR NONE DETECTED 09/14/2019 0648   LABBENZ NONE DETECTED 10/13/2019 0648   AMPHETMU NONE DETECTED 09/27/2019 0648   THCU NONE DETECTED 10/09/2019 0648   LABBARB NONE DETECTED 09/19/2019  0648    Alcohol Level No results found for: [facility]  IMAGING   Dg Chest Port 1 View  Result Date: 10/08/2019 CLINICAL DATA:  Left-sided central line placement EXAM: PORTABLE CHEST 1 VIEW COMPARISON:  09/23/2019, 6 a.m. FINDINGS: Interval placement of a left neck vascular catheter, tip positioned near the superior cavoatrial junction. Support apparatus is otherwise unchanged, including endotracheal tube, esophagogastric tube, and large bore right chest tunneled multi lumen vascular catheter. Cardiomegaly with atelectasis or consolidation of the left lung base and diffuse heterogeneous opacity throughout the lungs. IMPRESSION: 1. Interval placement of a left neck vascular catheter, tip positioned near the superior cavoatrial junction. 2.  Otherwise unchanged AP portable examination. Electronically Signed   By: Eddie Candle M.D.   On: 10/04/2019 12:17   Dg Chest Portable 1 View  Result Date: 10/10/2019 CLINICAL DATA:  Intubation EXAM: PORTABLE CHEST 1 VIEW COMPARISON:  08/19/2019 FINDINGS:  Endotracheal tube in good position with tip between the clavicular heads and carina. Dialysis catheter with tip at the upper right atrium. The orogastric tube tip reaches the mid stomach. Cardiomegaly with hazy airspace opacity on both sides. No visible effusion or pneumothorax. IMPRESSION: 1. Unremarkable endotracheal and orogastric tube positioning. 2. Extensive bilateral airspace disease favoring edema. Aspiration is also considered given the presenting symptoms. Electronically Signed   By: Monte Fantasia M.D.   On: 10/01/2019 06:09   Ct Head Code Stroke Wo Contrast  Result Date: 09/23/2019 CLINICAL DATA:  Code stroke.  Altered level of consciousness EXAM: CT HEAD WITHOUT CONTRAST TECHNIQUE: Contiguous axial images were obtained from the base of the skull through the vertex without intravenous contrast. COMPARISON:  Brain MRI and MRA 06/22/2019 and 06/23/2019 FINDINGS: Brain: 6.8 x 3.7 x 5 cm acute hemorrhage in the right basal ganglia. This volume include some low-density areas which could be un clotted blood or parenchyma. There is intraventricular extension extending to the level of the fourth ventricle. Prominent local mass effect with midline shift measuring 1 cm. The ventricles are dilated. There is a background of severe chronic small vessel ischemia in the deep cerebral white matter. 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 Vascular: Negative Skull: Negative Sinuses/Orbits: Negative Other: Critical Value/emergent results were called by telephone at the time of interpretation on 10/03/2019 at 5:46 am to providerAroor, who is already aware. ASPECTS ([place] Stroke Program Early CT Score) Not scored with these findings IMPRESSION: 1. ~ 60 cc hematoma in the right basal ganglia with intraventricular extension, hydrocephalus, and 1 cm of midline shift. The location and history is compatible with hypertensive hemorrhage. There was a negative intracranial MRA 06/23/2019. 2. Background advanced chronic  white matter disease. Electronically Signed   By: Monte Fantasia M.D.   On: 09/24/2019 05:52   Transthoracic Echocardiogram  Recent Results (from the past 43800 hour(s))  ECHOCARDIOGRAM COMPLETE   Collection Time: 06/22/19 10:32 AM  Result Value   Weight 2,888.91   Height 69   BP 140/85   Narrative     ECHOCARDIOGRAM REPORT       Patient Name:   William [name] Date of Exam: 06/22/2019 Medical Rec #:  [id number]        Height:       69.0 in Accession #:    [id number]       Weight:       180.6 lb Date of Birth:  1986/06/11        BSA:          1.98 m Patient Age:  32 years         BP:           154/118 mmHg Patient Gender: M                HR:           110 bpm. Exam Location:  Inpatient    Procedure: 2D Echo  Indications:    murmur 785.2   History:        Patient has no prior history of Echocardiogram examinations.                 Risk Factors: Current Smoker and Hypertension. Recent cocaine                 use.   Sonographer:    Jannett Celestine RDCS (AE) Referring Phys: [id number] Silvestre Moment [facility]    Sonographer Comments: Uncooperative. IMPRESSIONS    1. The left ventricle has hyperdynamic systolic function, with an ejection fraction of >65%. The cavity size was decreased. There is severe concentric left ventricular hypertrophy. Left ventricular diastolic parameters were normal. No evidence of left  ventricular regional wall motion abnormalities.  2. The right ventricle has normal systolic function. The cavity was normal. There is no increase in right ventricular wall thickness. Right ventricular systolic pressure could not be assessed.  3. Small pericardial effusion.  4. No evidence of mitral valve stenosis.  5. The aortic valve is tricuspid. No stenosis of the aortic valve.  SUMMARY   Hyperdynamic LV and RV function, with near collapse of the intraventricular chambers during systole; consider hypovolemia. Severe concentric LVH, with LV gradient of at least 40 mmHg in  the mid cavity and 55mmHg near LVOT. No SAM of the mitral valve seen on this study.  FINDINGS  Left Ventricle: The left ventricle has hyperdynamic systolic function, with an ejection fraction of >65%. The cavity size was decreased. There is severe concentric left ventricular hypertrophy. Left ventricular diastolic parameters were normal. No  evidence of left ventricular regional wall motion abnormalities..  Right Ventricle: The right ventricle has normal systolic function. The cavity was normal. There is no increase in right ventricular wall thickness. Right ventricular systolic pressure could not be assessed.  Left Atrium: Left atrial size was normal in size.  Right Atrium: Right atrial size was normal in size.  Interatrial Septum: No atrial level shunt detected by color flow Doppler.  Pericardium: A small pericardial effusion is present. There is no evidence of cardiac tamponade.  Mitral Valve: The mitral valve is normal in structure. Mitral valve regurgitation is trivial by color flow Doppler. No evidence of mitral valve stenosis.  Tricuspid Valve: The tricuspid valve is normal in structure. Tricuspid valve regurgitation was not visualized by color flow Doppler.  Aortic Valve: The aortic valve is tricuspid Aortic valve regurgitation was not visualized by color flow Doppler. There is No stenosis of the aortic valve.  Pulmonic Valve: The pulmonic valve was grossly normal. Pulmonic valve regurgitation is not visualized by color flow Doppler. No evidence of pulmonic stenosis.  Aorta: The ascending aorta was not well visualized. The aortic arch was not well visualized. The descending aorta was not well visualized.  Pulmonary Artery: The pulmonary artery is not well seen.  Venous: The inferior vena cava is normal in size with greater than 50% respiratory variability.    +--------------+--------++ LEFT VENTRICLE         +----------------+---------++ +--------------+--------++  Diastology  PLAX 2D                +----------------+---------++ +--------------+--------++ LV e' lateral:  6.74 cm/s LVIDd:        3.29 cm  +----------------+---------++ +--------------+--------++ LV E/e' lateral:10.0      LVIDs:        2.15 cm  +----------------+---------++ +--------------+--------++ LV e' medial:   5.55 cm/s LV PW:        1.87 cm  +----------------+---------++ +--------------+--------++ LV E/e' medial: 12.2      LV IVS:       1.92 cm  +----------------+---------++ +--------------+--------++ LVOT diam:    2.30 cm  +--------------+--------++ LV SV:        29 ml    +--------------+--------++ LV SV Index:  14.18    +--------------+--------++ LVOT Area:    4.15 cm +--------------+--------++                        +--------------+--------++  +---------------+----------++ RIGHT VENTRICLE           +---------------+----------++ RV Basal diam: 3.00 cm    +---------------+----------++ RV S prime:    19.30 cm/s +---------------+----------++ TAPSE (M-mode):2.2 cm     +---------------+----------++  +---------------+-------++-----------++ LEFT ATRIUM           Index       +---------------+-------++-----------++ LA diam:       3.70 cm1.87 cm/m  +---------------+-------++-----------++ LA Vol (A2C):  39.4 ml19.92 ml/m +---------------+-------++-----------++ LA Vol (A4C):  39.5 ml19.97 ml/m +---------------+-------++-----------++ LA Biplane Vol:40.6 ml20.52 ml/m +---------------+-------++-----------++ +------------+---------++----------++ RIGHT ATRIUM         Index      +------------+---------++----------++ RA Pressure:3.00 mmHg           +------------+---------++----------++ RA Area:    9.37 cm            +------------+---------++----------++ RA Volume:  15.70 ml 7.94 ml/m +------------+---------++----------++     +-------------+-------++ AORTA                +-------------+-------++ Ao Root diam:3.20 cm +-------------+-------++  +--------------+----------++  +---------------+---------++ MITRAL VALVE              TRICUSPID VALVE          +--------------+----------++  +---------------+---------++ MV Area (PHT):4.36 cm    Estimated RAP: 3.00 mmHg +--------------+----------++  +---------------+---------++ MV PHT:       50.46 msec +--------------+----------++  +--------------+-------+ MV Decel Time:174 msec    SHUNTS                +--------------+----------++  +--------------+-------+ +--------------+-----------++ Systemic Diam:2.30 cm MV E velocity:67.70 cm/s  +--------------+-------+ +--------------+-----------++ MV A velocity:106.00 cm/s +--------------+-----------++ MV E/A ratio: 0.64        +--------------+-----------++    Buford Dresser MD Electronically signed by Buford Dresser MD Signature Date/Time: 06/22/2019/2:52:07 PM       Final     *Note: Due to a large number of results and/or encounters for the requested time period, some results have not been displayed. A complete set of results can be found in Results Review.   PHYSICAL EXAM Blood pressure (!) 175/111, pulse 94, temperature 99.9 F (37.7 C), resp. rate 18, weight 78 kg, SpO2 100 %.  General: Appears well-developed; young African-American male critically ill Psych: unable to assess Eyes: No scleral injection Head: Normocephalic; evd c/d/i Cardiovascular: Normal rate and regular rhythm. Respiratory: intubated, vented, breath sounds normal to anterior ascultation GI: Soft. No distension. There is no tenderness.  Skin: Warm, dry; clubbing of toes/fingers noted  Neurological Examination Mental Status: Comatose with eyes closed- no eye opening to [place] stim. Not following commands. No OCR, trace corneal reflexes bilaterally.  Pupils sluggishly  reactive bilaterally. Motor/Sensory: brief posturing, extension to noxious stem Plantars: mute Cerebellar: Unable  GCS: 4   HOME MEDICATIONS:  (Not in a hospital admission)   HOSPITAL MEDICATIONS:  .  stroke: mapping our early stages of recovery book   Does not apply Once  . pantoprazole (PROTONIX) IV  40 mg Intravenous QHS  . senna-docusate  1 tablet Oral BID    ALLERGIES Allergies  Allergen Reactions  . Reglan [Metoclopramide] Nausea And Vomiting    Develops akathisia   ASSESSMENT/PLAN [name] is a 33 y.o. male with history of hypertension, end-stage renal disease on ASA+Plavix for recent stroke presents to the emergency department obtunded with left-sided plegia and right gaze deviation. CT head showed 60 cc hematoma in the right basal ganglia with intraventricular extension, hydrocephalus, and 1 cm of midline shift. Patient was emergency intubated mainly for airway protection. EVD was emergently placed t bedside by NSGY for hydrocephalus. Patient also was given desmopressin given the fact that he is using Plavix with small studies supporting its use in the setting of antiplatelets diminishing incidence of hemorrhage expansion.   ICH: 56ml, ICH score 4 Hypertensive etiology, presented with HTN emergency.  Resultant  coma  Code Stroke CT Head -  massive Rt BG ICH with IVH and hypdrocephalus  CTA H&N - pending  2D Echo - pending  Sars Corona Virus 2  neg  LDL - n/a    Component Value Date/Time   LDLCALC 158 (H) 06/23/2019 0301     HgbA1c - pending  UDS neg  VTE prophylaxis - SCDs only d/t bleed Diet  Diet Order            Diet NPO time specified  Diet effective now               ASA + Plavix prior to admission, now not indicated  Unable to counsel  Ongoing aggressive stroke risk factor management  Therapy recommendations:  pending  Disposition:  Pending  Hypertension  Home BP meds: none  Current BP meds:  Cleviprex  Unstable . Goal SBP <140 . Long-term BP goal normotensive  Hyperlipidemia  Home Lipid lowering medication: none   LDL goal N/A  Diabetes  Home diabetic meds: none  Current diabetic meds:none  HgbA1c pending, goal < 7.0 Recent Labs    09/27/2019 0537  GLUCAP 139*     Other Stroke Risk Factors  Cigarette smoker, unable to be advised to stop smoking  ETOH abuse; wife reports he quit in July  Hx stroke/TIA  Substance Abuse + Cocaine abuse; wife reports he quit in July  Other Active Problems  High risk of M&M. Poor prognosis d/w wife at [facility] day # 0 Desiree Metzger-Cihelka, ARNP-C, ANVP-BC Pager: [phone] I have personally obtained history,examined this patient, reviewed notes, independently viewed imaging studies, participated in medical decision making and plan of care.ROS completed by me personally and pertinent positives fully documented  I have made any additions or clarifications directly to the above note. Agree with note above.  He has presented with a large basal ganglia hemorrhage with volume of 63 cubic cc an ICH score of 4 cytotoxic edema and right-to-left shift and neurological exam and prognosis is quite poor.  Ventriculostomy catheter has not shown significant drainage.  Patient will be admitted to the intensive care unit for  close neurological monitoring and tight blood pressure control.  Keep systolic blood pressure goal below 140 for 24 hours and then below 160.  Start hypertonic saline with serum sodium goal 150-155.  Plan repeat CT scan of the head later this morning to look for hematoma expansion.  Consult critical care team for vent management and central line placement and addressing medical issues.  Long discussion with the patient's wife at the bedside as well as with Dr. Lynetta Mare critical care medicine and answered questions. This patient is critically ill and at significant risk of neurological worsening, death and care  requires constant monitoring of vital signs, hemodynamics,respiratory and cardiac monitoring, extensive review of multiple databases, frequent neurological assessment, discussion with family, other specialists and medical decision making of high complexity.I have made any additions or clarifications directly to the above note.This critical care time does not reflect procedure time, or teaching time or supervisory time of PA/NP/Med Resident etc but could involve care discussion time.  I spent 50 minutes of neurocritical care time  in the care of  this patient.      Antony Contras, MD Medical Director [facility] Pager: [phone] 10/07/2019 5:57 PM  To contact Stroke Continuity provider, please refer to [email or web address] After hours, contact General Neurology

## 2019-10-11 NOTE — ED Provider Notes (Addendum)
TIME SEEN: 6:19 AM  CHIEF COMPLAINT: Code stroke  HPI: Patient is a 33 year old male with history of hypertension, hyperlipidemia, end-stage renal disease on hemodialysis, previous stroke July 2020 on aspirin and Plavix, cocaine abuse who presents to the emergency department with [facility] as a code stroke.  Last seen normal by his wife at 9 PM.  She noticed just prior to arrival that he seemed to be breathing abnormally while they were in bed.  She states that she looked over at him and he had a right gaze preference and was not moving his left side.  He was able to talk to her at that time.  She called 911 because she was concerned he was having another stroke.  Patient's blood pressure in the 200s/120s with EMS.  Blood glucose normal.  Patient is on amlodipine, carvedilol, hydralazine, minoxidil and spironolactone for his hypertension.  Has history of medical noncompliance.  Patient had an AV graft placed in the right upper extremity on 08/15/2019 by Dr. Doren Custard.  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  Currently receiving dialysis through a tunneled catheter in the right IJ.  Dialysis started end of August 2020.  Dialyzed MWF per wife.  ROS: Level 5 caveat for altered mental status  PAST MEDICAL HISTORY/PAST SURGICAL HISTORY:  Past Medical History:  Diagnosis Date  . Anemia   . Drug abuse, cocaine type ([place]) 06/23/2019  . ESRD (end stage renal disease) ([place]) 08/08/2019  . Hyperlipidemia   . Hypertension   . Stroke [facility]) 06/2019    MEDICATIONS:  Prior to Admission medications   Medication Sig Start Date End Date Taking? Authorizing Provider  amLODipine (NORVASC) 10 MG tablet Take 1 tablet (10 mg total) by mouth at bedtime. 07/08/19   Debbe Odea, MD  atorvastatin (LIPITOR) 40 MG tablet Take 1 tablet (40 mg total) by mouth daily at 6 PM. 06/29/19   Shelly Coss, MD  carvedilol (COREG) 25 MG tablet Take 1 tablet (25 mg total) by mouth 2 (two) times daily with a meal.  06/29/19   Shelly Coss, MD  chlorproMAZINE (THORAZINE) 10 MG tablet Take 1 tablet (10 mg total) by mouth 3 (three) times daily as needed for hiccoughs. 08/10/19   Shelly Coss, MD  clopidogrel (PLAVIX) 75 MG tablet Take 75 mg by mouth daily. 08/11/19   [provider]  folic acid-vitamin b complex-vitamin c-selenium-zinc (DIALYVITE) 3 MG TABS tablet Take 1 tablet by mouth daily.    [provider]  hydrALAZINE (APRESOLINE) 100 MG tablet Take 1 tablet (100 mg total) by mouth 3 (three) times daily with meals. 09/07/19   Deberah Pelton, NP  minoxidil (LONITEN) 2.5 MG tablet Take 2 tablets (5 mg total) by mouth 2 (two) times daily. 08/20/19   Debbe Odea, MD  nicotine (NICODERM CQ - DOSED IN MG/24 HR) 7 mg/24hr patch Place 7 mg onto the skin daily.    [provider]  oxyCODONE-acetaminophen (PERCOCET) 5-325 MG tablet Take 1 tablet by mouth every 6 (six) hours as needed for severe pain. 08/27/19 08/26/20  Arrien, Jimmy Picket, MD  pantoprazole (PROTONIX) 40 MG tablet Take 1 tablet (40 mg total) by mouth daily. 08/27/19 09/26/19  Arrien, Jimmy Picket, MD  promethazine (PHENERGAN) 12.5 MG tablet TK 1 T PO TID WC PRF NAUSEA 09/13/19   [provider]  spironolactone (ALDACTONE) 25 MG tablet Take 25 mg by mouth daily.    [provider]  sucroferric oxyhydroxide (VELPHORO) 500 MG chewable tablet Chew  500 mg by mouth 3 (three) times daily with meals. 08/21/19   [provider]    ALLERGIES:  Allergies  Allergen Reactions  . Reglan [Metoclopramide] Nausea And Vomiting    Develops akathisia    SOCIAL HISTORY:  Social History   Tobacco Use  . Smoking status: Former Smoker    Packs/day: 0.00    Years: 20.00    Pack years: 0.00    Quit date: 06/2019    Years since quitting: 0.3  . Smokeless tobacco: Never Used  Substance Use Topics  . Alcohol use: Not Currently    Comment: 2-3 beers a day; stopped with 7/20 hospitalization    FAMILY  HISTORY: Family History  Problem Relation Age of Onset  . Diabetes Sister   . Diabetes Maternal Uncle   . Hypertension Father   . Hypertension Paternal Grandfather   . Prostate cancer Paternal Grandfather   . Diabetes Sister   . Renal Disease Neg Hx     EXAM: BP (!) 175/111   Pulse 79   Resp 16   Wt 78 kg   SpO2 100%   BMI 25.39 kg/m  CONSTITUTIONAL: Patient initially could answer his first name but did not follow commands or answer other questions.  Quickly deteriorated after CT scan and no longer able to answer questions, follow commands.  He did have his eyes open.  He would withdraw slightly to painful stimuli. HEAD: Normocephalic, atraumatic EYES: Conjunctivae clear, pupils appear equal, EOMI ENT: normal nose; moist mucous membranes NECK: Supple, no meningismus, no nuchal rigidity, no LAD  CARD: RRR; S1 and S2 appreciated; no murmurs, no clicks, no rubs, no gallops CHEST: Alysis catheter in place without redness, warmth, ecchymosis, bleeding or drainage RESP: Patient is tachypneic.  He has rhonchorous breath sounds diffusely.  No wheezing or rales.  No hypoxia. ABD/GI: Normal bowel sounds; non-distended; soft, non-tender, no rebound, no guarding, no peritoneal signs, no hepatosplenomegaly BACK:  The back appears normal  EXT: AV graft in the right upper extremity.  2+ right radial pulse. SKIN: Normal color for age and race; warm; no rash NEURO: Patient initially able to answer his name.  Has right gaze preference and left-sided weakness.  Quickly deteriorated after CT and patient began having sonorous respirations, would withdrawal slightly in his upper extremities to painful stimuli.  Pupils approximately 3 to 4 mm bilaterally and sluggishly reactive.  Not able to answer questions or follow commands.  MEDICAL DECISION MAKING: Patient came in as a code stroke.  Has been seen by neurology, Dr. Lorraine Lax immediately on arrival.  Patient has a large right basal ganglia bleed with  intraventricular hemorrhage and midline shift.  Will intubate given change in mental status and large ICH for airway protection.  Will give IV labetalol 20 mg and start IV Cleviprex.  Will sedate with propofol.  DDAVP has been ordered given patient is on Plavix and aspirin.  ED PROGRESS:  Intubated with etomidate and rocuronium.  5:52 AM  Spoke with Dr. Zada Finders with NSG who will see in ED for possible EVD.    6:18 AM  Spoke with Dr. Genevive Bi with ICU.  They will see for admission.   Arterial line placed by respiratory therapy.  Blood pressures still significantly elevated in the 190s/80s despite being maxed out on IV Cleviprex.  Have increased patient's propofol and will give IV hydralazine.   7:15 AM  Blood pressure has improved to 130-140s SBP after prn IV hydralazine.  Critical care at bedside.  Patient's  wife has been updated regarding his critical status.  She does not want to come back to see patient in this state at this time.   I reviewed all nursing notes and pertinent previous records as available.  I have interpreted any EKGs, lab and urine results, imaging (as available).   Date: 09/21/2019 7:16 AM  Rate: 88  Rhythm: normal sinus rhythm  QRS Axis: normal  Intervals: normal  ST/T Wave abnormalities: normal  Conduction Disutrbances: none  Narrative Interpretation: Nonspecific diffuse ST flattening, lateral Q waves, borderline prolonged QTc interval of 483 ms      CRITICAL CARE Performed by: Pryor Curia   Total critical care time: 66 minutes  Critical care time was exclusive of separately billable procedures and treating other patients.  Critical care was necessary to treat or prevent imminent or life-threatening deterioration.  Critical care was time spent personally by me on the following activities: development of treatment plan with patient and/or surrogate as well as nursing, discussions with consultants, evaluation of patient's response to treatment,  examination of patient, obtaining history from patient or surrogate, ordering and performing treatments and interventions, ordering and review of laboratory studies, ordering and review of radiographic studies, pulse oximetry and re-evaluation of patient's condition.    Procedure Name: Intubation Date/Time: 10/01/2019 6:00 AM Performed by: Raeshawn Tafolla, Delice Bison, DO Pre-anesthesia Checklist: Patient identified, Patient being monitored, Emergency Drugs available, Timeout performed and Suction available Oxygen Delivery Method: Ambu bag Preoxygenation: Pre-oxygenation with 100% oxygen Induction Type: Rapid sequence Ventilation: Mask ventilation without difficulty Laryngoscope Size: Glidescope Grade View: Grade I Tube size: 7.5 mm Number of attempts: 1 Placement Confirmation: ETT inserted through vocal cords under direct vision,  CO2 detector and Breath sounds checked- equal and bilateral Secured at: 22 cm Tube secured with: ETT holder       William [name] was evaluated in Emergency Department on 09/24/2019 for the symptoms described in the history of present illness. He was evaluated in the context of the global COVID-19 pandemic, which necessitated consideration that the patient might be at risk for infection with the SARS-CoV-2 virus that causes COVID-19. Institutional protocols and algorithms that pertain to the evaluation of patients at risk for COVID-19 are in a state of rapid change based on information released by regulatory bodies including the CDC and federal and state organizations. These policies and algorithms were followed during the patient's care in the ED.      Lacy Sofia, Delice Bison, DO 10/13/2019 1047

## 2019-10-11 NOTE — Consult Note (Signed)
NAME:  William [name], MRN:  [id number], DOB:  1986-02-13, LOS: 0 ADMISSION DATE:  09/26/2019, CONSULTATION DATE:  10/03/2019 REFERRING MD:  Ward  CHIEF COMPLAINT:  AMS.   Brief History    33 y.o. male with PMH of HTN, ESRD and cocaine use with previous stroke on ASA and plavix who woke up 10/28 feeling short of breath and wife noted slurred speech and facial droop.  CT showed large R hemorrhage, pt deteriorated and required intubation.  Pt given DDAVP, neurosurgery and neurology consulted and PCCM consulted.   History of present illness   Pt is encephelopathic; therefore, this HPI is obtained from chart review. William [name] is a 33 y.o. male who has a PMH including but not limited to CVA (06/2019 MRI showed numerous scattered acute infarcts) , HTN, HLD, ESRD, cocaine abuse. (see "past medical history" for rest).  He presented to [facility] ED 10/28 after waking up with dyspnea, slurred speech, R gaze deviation.  On arrival to the ED, pt was obtunded and intubated. Patient severely hypertensive on admission, blood pressure 188/137 on admission  Lab work on admission with mild hypokalemia, creatinine 11.17, BUN 23, and hemaglobin 9.6. CT showed showed large R BG ICH with IV extension and MLS (formal read pending).  Given DDAVP and Cleviprex in the ED, Neurosurgery consulted and will attempt to place EVD. PCCM consulted for critical care management.   Past Medical History  CVA 06/2019 HTN HLD ESRD iHD TTHS Cocaine abuse. Anemia  Right AV graft placed 08/15/2019  Significant Hospital Events   10/28 > admit, intubated with large R BG ICH  Consults:  Neurosurgery PCCM.  Procedures:  ETT 10/28 >>  Significant Diagnostic Tests:  [facility] 10/28 >> right BG ICH with Intraventricular extension and MLS (formal read pending).  Micro Data:  SARS CoV2 10/28 >>   Antimicrobials:  None  Interim history/subjective:  Seen post intubation, currently sedated and on mechanical intubation. Blood pressure  better controlled. Currently awaiting Neurosurgery consult  Objective:  Blood pressure (!) 175/111, pulse 79, temperature (!) 96.8 F (36 C), temperature source Tympanic, resp. rate 16, weight 78 kg, SpO2 100 %.    Vent Mode: PRVC FiO2 (%):  [100 %] 100 % Set Rate:  [18 bmp] 18 bmp Vt Set:  [560 mL] 560 mL PEEP:  [5 cmH20] 5 cmH20 Plateau Pressure:  [21 cmH20] 21 cmH20   Intake/Output Summary (Last 24 hours) at 09/20/2019 0636 Last data filed at 09/24/2019 [id number] Gross per 24 hour  Intake 0 ml  Output -  Net 0 ml   Filed Weights   10/10/2019 [id number]  Weight: 78 kg    Examination: General: Ill appearing elderly male on mechanical ventilation, in NAD HEENT: ETT, MM pink/moist, pupils 22mm and nonreactive to light Neuro: Sedated and on mechanical intubation CV: s1s2 regular rate and rhythm, no murmur, rubs, or gallops,  PULM:  Rhonchi bilaterally, no increased work of breathing on vent  GI: soft, bowel sounds active in all 4 quadrants, non-tender, non-distended Extremities: warm/dry, no edema  Skin: no rashes or lesions'  Assessment & Plan:   Acute Hypoxic Respiratory Failure  -In the setting of acute Basal ganglia ICH  P: Continue ventilator support with lung protective strategies   Obtain CXR and ABG post intubation    Wean PEEP and FiO2 for sats greater than 90%. Head of bed elevated 30 degrees. Plateau pressures less than 30 cm H20.  SAT/SBT as tolerated, mentation preclude extubation  Ensure adequate pulmonary hygiene  Follow  cultures  VAP bundle in place   Right Basal Glanglia ICH - s/p DDAVP - Neurosurgery consulted by EDP - EVD pending P: Follow mental status, if no improvement post EVD, then likely terminal event Currently maxed on Cleviprex add Cardene for SPB gola of >140  Hx ESRD on HD MWF -Per spouse patient was started on iHD 2 months ago, unsure of last iHD treatment, creatinine 11.10 on admission. Baseline creatinine 7-9 -Has Right AV graft and right  tunneled HD cath in place  P: Consult nephrology Trend chemistry   Hx HTN, HLD P: Continue cleviprex for goal SBP < 140 per neuro. Hold preadmission amlodipine, atorvastatin, carvedilol, clopidogrel, hyralazine, minoxidil, spironolactone Resume home medication once neurosurgery evaluates   Hx CVA P: Hold preadmission clopidogrel.  Hx cocaine abuse / Polysubstance abuse  P: F/u UDS Polysubstance abuse counseling Avoid use of beta blocker per primary cardiology   Hypokalemia / Hypocalcemia P: Replace Trend Bmet   Best Practice:  Diet: NPO. Pain/Anxiety/Delirium protocol (if indicated): Fentanyl PRN / Midazolam PRN.  RASS goal -1. VAP protocol (if indicated): In place. DVT prophylaxis: SCD's. GI prophylaxis: PPI. Glucose control: SSI. Mobility: Bedrest. Code Status: Full Family Communication: Spouse updated at bedside  Disposition: ICU.  Labs   CBC: Recent Labs  Lab 10/09/2019 0540 09/14/2019 0613  WBC 7.1  --   NEUTROABS 3.9  --   HGB 9.6* 10.9*  HCT 30.6* 32.0*  MCV 93.3  --   PLT 387  --    Basic Metabolic Panel: Recent Labs  Lab 10/06/19 0820 10/07/2019 0540 10/05/2019 0613  NA 141 136 135  K 3.6 3.1* 3.1*  CL 94* 93* 96*  CO2 21 24  --   GLUCOSE 89 158* 152*  BUN 20 23* 24*  CREATININE 9.56* 11.17* 11.10*  CALCIUM 10.1 9.9  --    GFR: Estimated Creatinine Clearance: 9.3 mL/min (A) (by C-G formula based on SCr of 11.1 mg/dL (H)). Recent Labs  Lab 10/10/2019 0540  WBC 7.1   Liver Function Tests: Recent Labs  Lab 09/14/2019 0540  AST 20  ALT 16  ALKPHOS 70  BILITOT 0.8  PROT 7.1  ALBUMIN 4.2   No results for input(s): LIPASE, AMYLASE in the last 168 hours. No results for input(s): AMMONIA in the last 168 hours. ABG    Component Value Date/Time   TCO2 27 10/02/2019 0613    Coagulation Profile: No results for input(s): INR, PROTIME in the last 168 hours. Cardiac Enzymes: No results for input(s): CKTOTAL, CKMB, CKMBINDEX, TROPONINI in the  last 168 hours. HbA1C: Hgb A1c MFr Bld  Date/Time Value Ref Range Status  06/23/2019 03:01 AM 4.9 4.8 - 5.6 % Final    Comment:    (NOTE)         Prediabetes: 5.7 - 6.4         Diabetes: >6.4         Glycemic control for adults with diabetes: <7.0    CBG: No results for input(s): GLUCAP in the last 168 hours.  Review of Systems:   Unable to obtain as pt is encephalopathic.  Past medical history  He,  has a past medical history of Anemia, Drug abuse, cocaine type ([place]) (06/23/2019), ESRD (end stage renal disease) ([place]) (08/08/2019), Hyperlipidemia, Hypertension, and Stroke ([place]) (06/2019).   Surgical History    Past Surgical History:  Procedure Laterality Date  . AV FISTULA PLACEMENT Right 08/15/2019   Procedure: INSERTION OF ARTERIOVENOUS (AV) GORE-TEX GRAFT ARM. Using 4-26mm STRETCH  Goretex.;  Surgeon: Angelia Mould, MD;  Location: [facility] OR;  Service: Vascular;  Laterality: Right;  . BIOPSY  08/27/2019   Procedure: BIOPSY;  Surgeon: Ronnette Juniper, MD;  Location: [facility] ENDOSCOPY;  Service: Gastroenterology;;  . ESOPHAGOGASTRODUODENOSCOPY (EGD) WITH PROPOFOL N/A 08/27/2019   Procedure: ESOPHAGOGASTRODUODENOSCOPY (EGD) WITH PROPOFOL;  Surgeon: Ronnette Juniper, MD;  Location: [place];  Service: Gastroenterology;  Laterality: N/A;  . INSERTION OF DIALYSIS CATHETER Right 08/08/2019   Procedure: INSERTION OF right internal jugular DIALYSIS CATHETER;  Surgeon: Angelia Mould, MD;  Location: [place];  Service: Vascular;  Laterality: Right;  . RADIOLOGY WITH ANESTHESIA N/A 06/23/2019   Procedure: MRI OF VESSELS;  Surgeon: Radiologist, Medication, MD;  Location: [place];  Service: Radiology;  Laterality: N/A;     Social History   reports that he quit smoking about 3 months ago. He smoked 0.00 packs per day for 20.00 years. He has never used smokeless tobacco. He reports previous alcohol use. He reports current drug use. Drugs: Marijuana, Cocaine, and Benzodiazepines.   Family history    His family history includes Diabetes in his maternal uncle, sister, and sister; Hypertension in his father and paternal grandfather; Prostate cancer in his paternal grandfather. There is no history of Renal Disease.   Allergies Allergies  Allergen Reactions  . Reglan [Metoclopramide] Nausea And Vomiting    Develops akathisia     Home meds  Prior to Admission medications   Medication Sig Start Date End Date Taking? Authorizing Provider  amLODipine (NORVASC) 10 MG tablet Take 1 tablet (10 mg total) by mouth at bedtime. 07/08/19   Debbe Odea, MD  atorvastatin (LIPITOR) 40 MG tablet Take 1 tablet (40 mg total) by mouth daily at 6 PM. 06/29/19   Shelly Coss, MD  carvedilol (COREG) 25 MG tablet Take 1 tablet (25 mg total) by mouth 2 (two) times daily with a meal. 06/29/19   Shelly Coss, MD  chlorproMAZINE (THORAZINE) 10 MG tablet Take 1 tablet (10 mg total) by mouth 3 (three) times daily as needed for hiccoughs. 08/10/19   Shelly Coss, MD  clopidogrel (PLAVIX) 75 MG tablet Take 75 mg by mouth daily. 08/11/19   [provider]  folic acid-vitamin b complex-vitamin c-selenium-zinc (DIALYVITE) 3 MG TABStablet Take 1 tablet by mouth daily.    [provider]  hydrALAZINE (APRESOLINE) 100 MG tablet Take 1 tablet (100 mg total) by mouth 3 (three) times daily with meals. 09/07/19   Deberah Pelton, NP  minoxidil (LONITEN) 2.5 MG tablet Take 2 tablets (5 mg total) by mouth 2 (two) times daily. 08/20/19   Debbe Odea, MD  nicotine (NICODERM CQ - DOSED IN MG/24 HR) 7 mg/24hr patch Place 7 mg onto the skin daily.    [provider]  oxyCODONE-acetaminophen (PERCOCET) 5-325 MG tablet Take 1 tablet by mouth every 6 (six) hours as needed for severe pain. 08/27/19 08/26/20  Arrien, Jimmy Picket, MD  pantoprazole (PROTONIX) 40 MG tablet Take 1 tablet (40 mg total) by mouth daily. 08/27/19 09/26/19  Arrien, Jimmy Picket, MD  promethazine (PHENERGAN) 12.5 MG tablet TK 1 T PO  TID WC PRF NAUSEA 09/13/19   [provider]  spironolactone (ALDACTONE) 25 MG tablet Take 25 mg by mouth daily.    [provider]  sucroferric oxyhydroxide (VELPHORO) 500 MG chewable tablet Chew 500 mg by mouth 3 (three) times daily with meals. 08/21/19   [provider]    Critical care time:    CRITICAL CARE Performed by:  Johnsie Cancel   Total critical care time: 40 minutes  Critical care time was exclusive of separately billable procedures and treating other patients.  Critical care was necessary to treat or prevent imminent or life-threatening deterioration.  Critical care was time spent personally by me on the following activities: development of treatment plan with patient and/or surrogate as well as nursing, discussions with consultants, evaluation of patient's response to treatment, examination of patient, obtaining history from patient or surrogate, ordering and performing treatments and interventions, ordering and review of laboratory studies, ordering and review of radiographic studies, pulse oximetry and re-evaluation of patient's condition.  Signature:    Johnsie Cancel, NP-C [facility] After hours pager: [phone]. 09/23/2019, 8:03 AM

## 2019-10-11 NOTE — Progress Notes (Signed)
Received referral from on call Chaplain to continue support.  Patient husband is critical . William [name] does not want to go to bedside yet. She also does not want chaplain to hang around to long but just to check in with her periodically. I made her nurse aware of this and he will keep her informed regarding her husband.  Chaplain available as needed.  [name], [place], [facility], Pager [phone]

## 2019-10-11 NOTE — Consult Note (Addendum)
Neurosurgery Consultation  Reason for Consult: ICH with IVH, hydrocephalus Referring Physician: A[place]  CC: Obtundation  HPI: This is a 33 y.o. man that presents with obtundation, intubated in the ED. Per chart review of EMR, pt has ESRD w/ HTN, on aspirin / plavix. Came to the ED this morning, became obtunded and was intubated for airway protection. Unable to obtain further history due to patient's obtundation.  ROS: A 14 point ROS was performed and is negative except as noted in the HPI but limited due to patient's mental status  PMHx:  Past Medical History:  Diagnosis Date  . Anemia   . Drug abuse, cocaine type ([place]) 06/23/2019  . ESRD (end stage renal disease) ([place]) 08/08/2019  . Hyperlipidemia   . Hypertension   . Stroke [facility]) 06/2019   FamHx:  Family History  Problem Relation Age of Onset  . Diabetes Sister   . Diabetes Maternal Uncle   . Hypertension Father   . Hypertension Paternal Grandfather   . Prostate cancer Paternal Grandfather   . Diabetes Sister   . Renal Disease Neg Hx    SocHx:  reports that he quit smoking about 3 months ago. He smoked 0.00 packs per day for 20.00 years. He has never used smokeless tobacco. He reports previous alcohol use. He reports current drug use. Drugs: Marijuana, Cocaine, and Benzodiazepines.  Exam: Vital signs in last 24 hours: Temp:  [96.3 F (35.7 C)-96.8 F (36 C)] 96.6 F (35.9 C) (10/28 0821) Pulse Rate:  [78-93] 91 (10/28 0821) Resp:  [16-28] 26 (10/28 0821) BP: (175-188)/(111-137) 175/111 (10/28 0615) SpO2:  [93 %-100 %] 100 % (10/28 0821) Arterial Line BP: (148-156)/(54-58) 148/55 (10/28 0821) FiO2 (%):  [100 %] 100 % (10/28 0741) Weight:  [78 kg] 78 kg (10/28 [id number]) General: Lying in hospital bed, intubated, appears acutely ill Head: normocephalic and atruamatic HEENT: neck supple Pulmonary: intubated, good chest rise symmetrically Cardiac: RRR Abdomen: S NT ND Extremities: warm and well perfused x4, dialysis  fistula present Neuro: intubated, sedation held, got intubated ~1h earlier w/ vec or roc + etomidate, per report. Pupils 60mm b/l and minimally reactive, no eye closure to corneal stimulation but does have some non-patterned shaking of 4 extremities to corneal irritation, +c/g Minimal w/d x4 to painful stimulus   Assessment and Plan: 33 y.o. man w/ acute obtundation in the setting of ESRD, on ASA/plavix for prior strokes. [place] personally reviewed, which shows R BG ICH with IVH >30cc, dilation of the lateral ventricles, 3rd/4th casted. Reportedly low GCS and intubated, ICH score of 4. Additionally, pt is ESRD on ASA/clopidogrel. On CT, hemorrhage does not appear organized, consistent with the patient's known coagulopathies. Given his coagulopathies, I unfortunately think that [place] evacuation is too high risk to be beneficial.   Emergently placed R EVD given the possibility that his poor exam is due to hydrocephalus. If his exam does not improve post-EVD, I think it is clear that this is a fatal injury. Got DDAVP prior to EVD placement.  -discussed the above with the patient's wife -EVD at +5cm H2O -repeat CTH w/o contrast to check drain placement when able  Judith Part, MD 10/10/2019 8:45 AM [facility]

## 2019-10-12 DIAGNOSIS — J9601 Acute respiratory failure with hypoxia: Secondary | ICD-10-CM | POA: Diagnosis not present

## 2019-10-12 DIAGNOSIS — I610 Nontraumatic intracerebral hemorrhage in hemisphere, subcortical: Secondary | ICD-10-CM | POA: Diagnosis not present

## 2019-10-12 LAB — PHOSPHORUS
Phosphorus: 5.3 mg/dL — ABNORMAL HIGH (ref 2.5–4.6)
Phosphorus: 7 mg/dL — ABNORMAL HIGH (ref 2.5–4.6)
Phosphorus: 7.4 mg/dL — ABNORMAL HIGH (ref 2.5–4.6)

## 2019-10-12 LAB — CBC
HCT: 21.6 % — ABNORMAL LOW (ref 39.0–52.0)
HCT: 25 % — ABNORMAL LOW (ref 39.0–52.0)
Hemoglobin: 7.1 g/dL — ABNORMAL LOW (ref 13.0–17.0)
Hemoglobin: 8 g/dL — ABNORMAL LOW (ref 13.0–17.0)
MCH: 30.2 pg (ref 26.0–34.0)
MCH: 29.9 pg (ref 26.0–34.0)
MCHC: 32.9 g/dL (ref 30.0–36.0)
MCHC: 32 g/dL (ref 30.0–36.0)
MCV: 91.9 fL (ref 80.0–100.0)
MCV: 93.3 fL (ref 80.0–100.0)
Platelets: 205 10*3/uL (ref 150–400)
Platelets: 240 10*3/uL (ref 150–400)
RBC: 2.35 MIL/uL — ABNORMAL LOW (ref 4.22–5.81)
RBC: 2.68 MIL/uL — ABNORMAL LOW (ref 4.22–5.81)
RDW: 16.1 % — ABNORMAL HIGH (ref 11.5–15.5)
RDW: 16.2 % — ABNORMAL HIGH (ref 11.5–15.5)
WBC: 7.5 10*3/uL (ref 4.0–10.5)
WBC: 10.3 10*3/uL (ref 4.0–10.5)
nRBC: 0 % (ref 0.0–0.2)
nRBC: 0 % (ref 0.0–0.2)

## 2019-10-12 LAB — BASIC METABOLIC PANEL
Anion gap: 19 — ABNORMAL HIGH (ref 5–15)
BUN: 31 mg/dL — ABNORMAL HIGH (ref 6–20)
CO2: 28 mmol/L (ref 22–32)
Calcium: 8.5 mg/dL — ABNORMAL LOW (ref 8.9–10.3)
Chloride: 91 mmol/L — ABNORMAL LOW (ref 98–111)
Creatinine, Ser: 13.78 mg/dL — ABNORMAL HIGH (ref 0.61–1.24)
GFR calc Af Amer: 5 mL/min — ABNORMAL LOW (ref >60)
GFR calc non Af Amer: 4 mL/min — ABNORMAL LOW (ref >60)
Glucose, Bld: 113 mg/dL — ABNORMAL HIGH (ref 70–99)
Potassium: 3.3 mmol/L — ABNORMAL LOW (ref 3.5–5.1)
Sodium: 138 mmol/L (ref 135–145)

## 2019-10-12 LAB — MAGNESIUM
Magnesium: 2.3 mg/dL (ref 1.7–2.4)
Magnesium: 2.4 mg/dL (ref 1.7–2.4)
Magnesium: 2.5 mg/dL — ABNORMAL HIGH (ref 1.7–2.4)

## 2019-10-12 LAB — GLUCOSE, CAPILLARY
Glucose-Capillary: 101 mg/dL — ABNORMAL HIGH (ref 70–99)
Glucose-Capillary: 107 mg/dL — ABNORMAL HIGH (ref 70–99)
Glucose-Capillary: 108 mg/dL — ABNORMAL HIGH (ref 70–99)
Glucose-Capillary: 99 mg/dL (ref 70–99)
Glucose-Capillary: 119 mg/dL — ABNORMAL HIGH (ref 70–99)
Glucose-Capillary: 129 mg/dL — ABNORMAL HIGH (ref 70–99)

## 2019-10-12 LAB — SODIUM
Sodium: 140 mmol/L (ref 135–145)
Sodium: 143 mmol/L (ref 135–145)
Sodium: 142 mmol/L (ref 135–145)

## 2019-10-12 LAB — TRIGLYCERIDES: Triglycerides: 171 mg/dL — ABNORMAL HIGH (ref <150)

## 2019-10-12 MED ORDER — VITAL HIGH PROTEIN PO LIQD: 1000.0000 mL | ORAL | Status: DC

## 2019-10-12 MED ORDER — SODIUM CHLORIDE 0.9% FLUSH: 10.0000 mL | INTRAVENOUS | Status: DC | PRN

## 2019-10-12 MED ORDER — SODIUM CHLORIDE 0.9% FLUSH
10.0000 mL | Freq: Two times a day (BID) | INTRAVENOUS | Status: DC
  Administered 2019-10-12 – 2019-10-24 (×21): 10 mL
  Administered 2019-10-24: 20 mL
  Administered 2019-10-25: 10 mL
  Administered 2019-10-26: 20 mL
  Administered 2019-10-26: 10 mL

## 2019-10-12 MED ORDER — VITAL HIGH PROTEIN PO LIQD
1000.0000 mL | ORAL | Status: DC
  Administered 2019-10-12 – 2019-10-15 (×3): 1000 mL

## 2019-10-12 MED ORDER — HEPARIN SODIUM (PORCINE) 1000 UNIT/ML IJ SOLN
1000.0000 [IU] | INTRAMUSCULAR | Status: DC | PRN
  Administered 2019-10-12 – 2019-10-24 (×3): 1000 [IU] via INTRAVENOUS
  Filled 2019-10-12 (×4): qty 6

## 2019-10-12 MED ORDER — PRO-STAT SUGAR FREE PO LIQD
30.0000 mL | Freq: Two times a day (BID) | ORAL | Status: DC
  Administered 2019-10-12: 12:00:00 30 mL
  Filled 2019-10-12: qty 30

## 2019-10-12 MED ORDER — CHLORHEXIDINE GLUCONATE CLOTH 2 % EX PADS: 6.0000 | MEDICATED_PAD | Freq: Every day | CUTANEOUS | Status: DC

## 2019-10-12 MED ORDER — SODIUM CHLORIDE 3 % IV SOLN
INTRAVENOUS | Status: DC
  Administered 2019-10-12 – 2019-10-15 (×10): 75 mL/h via INTRAVENOUS
  Administered 2019-10-16 – 2019-10-22 (×12): 50 mL/h via INTRAVENOUS
  Filled 2019-10-12 (×33): qty 500

## 2019-10-12 MED ORDER — PRO-STAT SUGAR FREE PO LIQD
30.0000 mL | Freq: Every day | ORAL | Status: DC
  Administered 2019-10-12 – 2019-10-16 (×19): 30 mL
  Filled 2019-10-12 (×17): qty 30

## 2019-10-12 MED ORDER — HEPARIN SODIUM (PORCINE) 1000 UNIT/ML IJ SOLN
INTRAMUSCULAR | Status: AC
  Filled 2019-10-12: qty 1

## 2019-10-12 MED ORDER — SODIUM CHLORIDE 23.4 % INJECTION (4 MEQ/ML) FOR IV ADMINISTRATION
120.0000 meq | Freq: Once | INTRAVENOUS | Status: AC
  Administered 2019-10-12: 11:00:00 120 meq via INTRAVENOUS
  Filled 2019-10-12: qty 30

## 2019-10-12 NOTE — Progress Notes (Signed)
Initial Nutrition Assessment  DOCUMENTATION CODES:   Non-severe (moderate) malnutrition in context of chronic illness  INTERVENTION:   Tube feeding: - Vital High Protein @ 20 ml/hr (480 ml/day) via OG tube - Pro-stat 30 ml 5 times daily  Tube feeding regimen provides 980 kcal, 117 grams of protein, and 401 ml of H2O.   Tube feeding regimen and current propofol and cleviprex provides 3490 total kcal (>100% of needs).  NUTRITION DIAGNOSIS:   Moderate Malnutrition related to chronic illness (ESRD on HD) as evidenced by moderate fat depletion, mild muscle depletion, moderate muscle depletion, percent weight loss (13.6% weight loss in 3 months).  GOAL:   Patient will meet greater than or equal to 90% of their needs  MONITOR:   Vent status, Labs, Weight trends, TF tolerance, Skin, I & O's  REASON FOR ASSESSMENT:   Ventilator, Consult Enteral/tube feeding initiation and management  ASSESSMENT:   33 year old male who presented to the ED on 10/28 with sudden onset facial droop, slurred speech, left sided weakness. PMH of HTN, ESRD on HD, HLD, prior cocaine abuse. Pt found to have [place] with IVH, hydrocephalus and intubated for airway protection.   10/28 - s/p emergent R EVD  RD consulted for TF intiation and management.  Noted plan for HD today off-schedule. Per Neurosurgery note, EVD drainage improving.  Spoke with pt's wife at bedside. Pt's wife shares that pt's UBW is between 230-240 lbs and that he lost weight earlier this year after his mini strokes. She reports pt's weight got down to 172 lbs in July and that he was able to gain some back to 180 lbs after receiving medication to help with his nausea.  Reviewed weight history in chart which dates back to 06/25/19. Pt with a 12 kg weight loss since 07/07/19. This is a 13.6% weight loss in 3 months which is significant for timeframe.  Pt's wife reports pt typically consumes 3-4 small meals/snacks daily rather than larger  meals.  CCM has ordered Adult ICU Tube Feeding Protocol. RD to adjust to better meet pt's needs.  OG tube in place, currently clamped.  Patient is currently intubated on ventilator support MV: 8.6 L/min Temp (24hrs), Avg:99.9 F (37.7 C), Min:97.9 F (36.6 C), Max:101.6 F (38.7 C) BP (a-line): 123/56 MAP (a-line): 73  Drips: Propofol: 18.7 ml/hr (provides 494 kcal daily from lipid) Cleviprex: 42 ml/hr (provides 2016 kcal daily from lipid) Hypertonic saline: 75 ml/hr  Medications reviewed and include: Protonix, Klor-con 40 mEq x 2, Senna  Labs reviewed: potassium 3.3, phosphorus 7.0, hemoglobin 8.0 CBG's: 101-113 x 24 hours  UOP: 115 ml x 24 hours EVD: 132 ml x 24 hours I/O's: +490 ml since admit EDW: 73.5 kg  NUTRITION - FOCUSED PHYSICAL EXAM:    Most Recent Value  Orbital Region  Moderate depletion  Upper Arm Region  Moderate depletion  Thoracic and Lumbar Region  Moderate depletion  Buccal Region  Unable to assess  Temple Region  Mild depletion  Clavicle Bone Region  Moderate depletion  Clavicle and Acromion Bone Region  Moderate depletion  Scapular Bone Region  Unable to assess  Dorsal Hand  Mild depletion  Patellar Region  Mild depletion  Anterior Thigh Region  Moderate depletion  Posterior Calf Region  Mild depletion  Edema (RD Assessment)  Mild [BLE]  Hair  Reviewed  Eyes  Unable to assess  Mouth  Unable to assess  Skin  Reviewed  Nails  Reviewed       Diet Order:  Diet Order            Diet NPO time specified  Diet effective now              EDUCATION NEEDS:   No education needs have been identified at this time  Skin:  Skin Assessment: Reviewed RN Assessment  Last BM:  no documented BM  Height:   Ht Readings from Last 1 Encounters:  09/21/19 5\' 9"  (1.753 m)    Weight:   Wt Readings from Last 1 Encounters:  10/12/19 76 kg    Ideal Body Weight:  72.7 kg  BMI:  Body mass index is 24.74 kg/m.  Estimated Nutritional Needs:    Kcal:  1947  Protein:  110-130 grams  Fluid:  >/= 1.8 L    Gaynell Face, MS, RD, LDN Inpatient Clinical Dietitian Pager: [phone] Weekend/After Hours: [phone]

## 2019-10-12 NOTE — Progress Notes (Signed)
Neurosurgery Service Progress Note  Subjective: No acute events overnight. EVD output improved overnight  Objective: Vitals:   10/12/19 0500 10/12/19 0700 10/12/19 0743 10/12/19 0800  BP: 121/61 124/66 130/71 131/74  Pulse: 80 80 88 88  Resp: _0 (!) 24  Temp: 98.4 F (36.9 C) 97.9 F (36.6 C)  97.9 F (36.6 C)  TempSrc:      SpO2: 100% 100% 100% 100%  Weight:       Temp (24hrs), Avg:100 F (37.8 C), Min:97.9 F (36.6 C), Max:101.6 F (38.7 C)  CBC Latest Ref Rng & Units 10/12/2019 09/28/2019 10/08/2019  WBC 4.0 - 10.5 K/uL 7.5 - -  Hemoglobin 13.0 - 17.0 g/dL 7.1(L) 9.2(L) 10.9(L)  Hematocrit 39.0 - 52.0 % 21.6(L) 27.0(L) 32.0(L)  Platelets 150 - 400 K/uL 205 - -   BMP Latest Ref Rng & Units 10/12/2019 09/27/2019 10/01/2019  Glucose 70 - 99 mg/dL 113(H) - -  BUN 6 - 20 mg/dL 31(H) - -  Creatinine 0.61 - 1.24 mg/dL 13.78(H) - -  BUN/Creat Ratio 9 - 20 - - -  Sodium 135 - 145 mmol/L 138 140 139  Potassium 3.5 - 5.1 mmol/L 3.3(L) - -  Chloride 98 - 111 mmol/L 91(L) - -  CO2 22 - 32 mmol/L 28 - -  Calcium 8.9 - 10.3 mg/dL 8.5(L) - -    Intake/Output Summary (Last 24 hours) at 10/12/2019 1019 Last data filed at 10/12/2019 0900 Gross per 24 hour  Intake 1679.06 ml  Output 1224 ml  Net 455.06 ml    Current Facility-Administered Medications:  .  acetaminophen (TYLENOL) tablet 650 mg, 650 mg, Per Tube, Q6H, Alfonzo Feller, NP, 650 mg at 10/12/19 0431 .  carvedilol (COREG) tablet 25 mg, 25 mg, Per Tube, BID WC, Aroor, Lanice Schwab, MD, 25 mg at 10/12/19 0837 .  chlorhexidine gluconate (MEDLINE KIT) (PERIDEX) 0.12 % solution 15 mL, 15 mL, Mouth Rinse, BID, Aroor, Lanice Schwab, MD, 15 mL at 10/12/19 0838 .  Chlorhexidine Gluconate Cloth 2 % PADS 6 each, 6 each, Topical, Daily, Aroor, Lanice Schwab, MD, 6 each at 10/12/19 0915 .  clevidipine (CLEVIPREX) infusion 0.5 mg/mL, 0-21 mg/hr, Intravenous, Continuous, Aroor, Karena Addison R, MD, Last Rate: 40 mL/hr at 10/12/19 0900, 20  mg/hr at 10/12/19 0900 .  docusate (COLACE) 50 MG/5ML liquid 100 mg, 100 mg, Per Tube, BID PRN, Merlene Laughter F, NP .  feeding supplement (PRO-STAT SUGAR FREE 64) liquid 30 mL, 30 mL, Per Tube, BID, Erick Colace, NP .  feeding supplement (VITAL HIGH PROTEIN) liquid 1,000 mL, 1,000 mL, Per Tube, Q24H, Erick Colace, NP .  heparin injection 1,000-6,000 Units, 1,000-6,000 Units, Intravenous, PRN, Aroor, Karena Addison R, MD .  labetalol (NORMODYNE) injection 20 mg, 20 mg, Intravenous, Q10 min PRN, Alfonzo Feller, NP, 20 mg at 09/27/2019 2335 .  MEDLINE mouth rinse, 15 mL, Mouth Rinse, 10 times per day, Aroor, Karena Addison R, MD, 15 mL at 10/12/19 0600 .  pantoprazole (PROTONIX) injection 40 mg, 40 mg, Intravenous, QHS, Merlene Laughter F, NP, 40 mg at 10/07/2019 2226 .  potassium chloride (KLOR-CON) packet 40 mEq, 40 mEq, Per Tube, BID, Aroor, Karena Addison R, MD .  propofol (DIPRIVAN) 1000 MG/100ML infusion, 5-80 mcg/kg/min, Intravenous, Continuous, [name], MD, Last Rate: 14.04 mL/hr at 10/12/19 0900, 30 mcg/kg/min at 10/12/19 0900 .  senna-docusate (Senokot-S) tablet 1 tablet, 1 tablet, Per Tube, BID, Aroor, Lanice Schwab, MD, 1 tablet at 10/10/2019 2226 .  sodium chloride (hypertonic) 3 %  solution, , Intravenous, Continuous, Sethi, Pramod S, MD .  sodium chloride 23.4 % (4 mEq/mL) IV in VIAFLEX BAG, 120 mEq, Intravenous, Once, Leonie Man, Pramod S, MD .  sodium chloride flush (NS) 0.9 % injection 10-40 mL, 10-40 mL, Intracatheter, Q12H, Aroor, Lanice Schwab, MD, 10 mL at 10/12/19 0916 .  sodium chloride flush (NS) 0.9 % injection 10-40 mL, 10-40 mL, Intracatheter, PRN, Aroor, Lanice Schwab, MD   Physical Exam: Intubated, minimal sedation, gaze neutral, pupils pinpoint, 0/5 in BUE, minimal / trace withdrawal in BLE  Assessment & Plan: 33 y.o. man w/ ESRD on ASA/plavix s/p R ICH/IVH s/p R EVD. Rpt CTH w/ malpositioned EVD.  -EVD drainage improving, either decompressing the hematoma or getting some CSF drainage along  the catheter tract. Either way, will keep EVD in place as either scenario is therapeutic. If it stops working for a sustained period, will remove it. Unfortunately, I agree that he is not going to have a good functional outcome.  [name]  10/12/19 10:19 AM

## 2019-10-12 NOTE — Progress Notes (Signed)
[facility] Progress Note  Subjective: BP's controlled , 40% FiO2.  Last CXR mild R edema.    Vitals:   10/12/19 0500 10/12/19 0700 10/12/19 0743 10/12/19 0800  BP: 121/61 124/66 130/71 131/74  Pulse: 80 80 88 88  Resp: _0 (!) 24  Temp: 98.4 F (36.9 C) 97.9 F (36.6 C)  97.9 F (36.6 C)  TempSrc:      SpO2: 100% 100% 100% 100%  Weight:        Inpatient medications: . acetaminophen  650 mg Per Tube Q6H  . carvedilol  25 mg Per Tube BID WC  . chlorhexidine gluconate (MEDLINE KIT)  15 mL Mouth Rinse BID  . Chlorhexidine Gluconate Cloth  6 each Topical Daily  . feeding supplement (PRO-STAT SUGAR FREE 64)  30 mL Per Tube BID  . feeding supplement (VITAL HIGH PROTEIN)  1,000 mL Per Tube Q24H  . mouth rinse  15 mL Mouth Rinse 10 times per day  . pantoprazole (PROTONIX) IV  40 mg Intravenous QHS  . potassium chloride  40 mEq Per Tube BID  . senna-docusate  1 tablet Per Tube BID  . sodium chloride flush  10-40 mL Intracatheter Q12H   . clevidipine 20 mg/hr (10/12/19 0900)  . propofol (DIPRIVAN) infusion 30 mcg/kg/min (10/12/19 0900)  . sodium chloride (hypertonic)    . sodium chloride 23.4% injection     docusate, heparin, labetalol, sodium chloride flush    Exam: Gen unreponsive, intubated , posturing No rash, cyanosis or gangrene Sclera anicteric, throat w/ ETT No jvd or bruits Chest clear bilat to bases RRR no MRG Abd soft ntnd no mass or ascites +bs GU normal male MS no joint effusions or deformity Ext no LE edema R TDC/ RUE AVG B+bruit    Home meds:  - amlodipine 10/ carvedilol 25 bid/ hydralazine 100 tid/ minoxidil 2.5 bid/ spironolactone 25 qd  - pantoprazole 40  - atrovastatin 40/ clopidogrel 75 qd   - prn's/ vitamins/ supplements    Outpt HD: [place] MWF  4h 40mn  73.5kg  2.2 bath  RU AVG (placed 08/15/19) / R TDC  Hep none  - darbe 200 /wk  - hect 3  - venofer 100 x 10 , completed     Assessment/ Plan: 1. Acute  intracranial bleed - w/ midline shift and IVH. SP EVD. Getting 3% saline. Per neuro/ NSurg. 2. ESRD - usual HD MWF. Recent start in Sept 2020. Plan HD today off schedule  3. Anemia ckd - Hb drop to 7.1, will repeat w/ HD and consider transfusion 4. HTN/volume - BP controlled here getting IV clevidipine, prn IV labetalol and NG coreg 25 bid, goal SBP < 160. Plan UF today to dry wt 2.5 L as tolerated.  5. H/o CVA - July 2020   6. H/o substance abuse       Rob Yanissa Michalsky 10/12/2019, 10:18 AM  Iron/TIBC/Ferritin/ %Sat    Component Value Date/Time   IRON 18 (L) 08/18/2019 2358   TIBC 239 (L) 08/18/2019 2358   FERRITIN 266 08/18/2019 2358   IRONPCTSAT 8 (L) 08/18/2019 2358   Recent Labs  Lab 09/17/2019 0540  10/12/19 0334  NA 136   < > 138  K 3.1*   < > 3.3*  CL 93*   < > 91*  CO2 24  --  28  GLUCOSE 158*   < > 113*  BUN 23*   < > 31*  CREATININE 11.17*   < > 13.78*  CALCIUM 9.9  --  8.5*  PHOS  --   --  5.3*  ALBUMIN 4.2  --   --   INR 1.2  --   --    < > = values in this interval not displayed.   Recent Labs  Lab 10/06/2019 0540  AST 20  ALT 16  ALKPHOS 70  BILITOT 0.8  PROT 7.1   Recent Labs  Lab 10/12/19 0334  WBC 7.5  HGB 7.1*  HCT 21.6*  PLT 205

## 2019-10-12 NOTE — Progress Notes (Signed)
STROKE TEAM PROGRESS NOTE   INTERVAL HISTORY Patient neurological condition remains unchanged.  He remains comatose unresponsive and has barely semipurposeful flexion response on stimulation.  His ventriculostomy catheter started draining overnight.  Hypertonic saline has been started but serum sodium is not at goal.  Blood pressure has been adequately controlled.  He remains on ventilatory support for respiratory failure.  OBJECTIVE Vitals:   10/12/19 0500 10/12/19 0700 10/12/19 0743 10/12/19 0800  BP: 121/61 124/66 130/71 131/74  Pulse: 80 80 88 88  Resp: 18 18 16  (!) 24  Temp: 98.4 F (36.9 C) 97.9 F (36.6 C)  97.9 F (36.6 C)  TempSrc:      SpO2: 100% 100% 100% 100%  Weight:        CBC:  Recent Labs  Lab 10/07/2019 0540  10/07/2019 0843 10/12/19 0334  WBC 7.1  --   --  7.5  NEUTROABS 3.9  --   --   --   HGB 9.6*   < > 9.2* 7.1*  HCT 30.6*   < > 27.0* 21.6*  MCV 93.3  --   --  91.9  PLT 387  --   --  205   < > = values in this interval not displayed.    Basic Metabolic Panel:  Recent Labs  Lab 09/30/2019 0540 09/17/2019 0613 10/13/2019 0843  09/17/2019 2017 10/12/19 0334  NA 136 135 133*   < > 140 138  K 3.1* 3.1* 3.2*  --   --  3.3*  CL 93* 96*  --   --   --  91*  CO2 24  --   --   --   --  28  GLUCOSE 158* 152*  --   --   --  113*  BUN 23* 24*  --   --   --  31*  CREATININE 11.17* 11.10*  --   --   --  13.78*  CALCIUM 9.9  --   --   --   --  8.5*  MG  --   --   --   --   --  2.3  PHOS  --   --   --   --   --  5.3*   < > = values in this interval not displayed.    Lipid Panel:     Component Value Date/Time   CHOL 253 (H) 06/23/2019 0301   TRIG 171 (H) 10/12/2019 0334   HDL 67 06/23/2019 0301   CHOLHDL 3.8 06/23/2019 0301   VLDL 28 06/23/2019 0301   LDLCALC 158 (H) 06/23/2019 0301   HgbA1c:  Lab Results  Component Value Date   HGBA1C 4.9 06/23/2019   Urine Drug Screen:     Component Value Date/Time   LABOPIA NONE DETECTED 10/08/2019 0648    COCAINSCRNUR NONE DETECTED 09/21/2019 0648   LABBENZ NONE DETECTED 10/10/2019 0648   AMPHETMU NONE DETECTED 09/16/2019 [place] DETECTED 09/19/2019 0648   LABBARB NONE DETECTED 09/16/2019 0648    Alcohol Level No results found for: ETH  IMAGING Ct Head Wo Contrast  Result Date: 10/03/2019 CLINICAL DATA:  Intracranial hemorrhage, known, follow-up EXAM: CT HEAD WITHOUT CONTRAST TECHNIQUE: Contiguous axial images were obtained from the base of the skull through the vertex without intravenous contrast. COMPARISON:  Head CT 09/26/2019 FINDINGS: Brain: Again demonstrated is a very large acute parenchymal hemorrhage centered within the right basal ganglia. Persistent regions of hypodensity within the hemorrhage which may reflect unclotted blood or  parenchyma. The dominant hemorrhage has not significantly changed in size since prior exam, again measuring approximately 6.8 x 3.6 x 4.9 cm. Redemonstrated surrounding edema and mass effect with partial effacement of the right lateral and third ventricles. Prominence of the right temporal horn and left lateral ventricle suspicious for entrapment. Unchanged 1 cm leftward midline shift. As before, there is intraventricular extension of hemorrhage to the level of the fourth ventricle. Intraventricular hemorrhage within the atrium of the right lateral ventricle has increased from prior exam (series 3, image 18). Small amount of hemorrhage now layering dependently within the left occipital horn, which may be secondary to redistribution. There has been interval placement of a right frontal approach ventricular catheter. It is unclear whether the tip of the catheter terminates within the right basal ganglia or foramen. However, there are small foci of pneumocephalus within the right frontal horn. Redemonstrated chronic small vessel ischemic disease. Vascular: No hyperdense vessel is identified Skull: No calvarial fracture.  Right frontal burr hole. Sinuses/Orbits:  Visualized orbits demonstrate no acute abnormality. Polypoid mucosal thickening within the left maxillary sinus. These results were called by telephone at the time of interpretation on 09/29/2019 at 7:18 pm to provider Dr. James Ivanoff, who verbally acknowledged these results. IMPRESSION: 1. A 6.8 x 3.6 x 4.9 cm parenchymal hemorrhage centered within the right basal ganglia has not significantly changed in size. Mass effect with partial effacement of ventricular system and unchanged 10 mm leftward midline shift. 2. Redemonstrated intraventricular extension of hemorrhage. Hemorrhage within the right lateral ventricle atrium has increased from prior exam. 3. Interval placement of a right frontal approach ventricular catheter. It is unclear whether the catheter tip terminates within the right basal ganglia or foramen of Monro. However, there are small foci of pneumocephalus within the right frontal horn. Consider short interval CT follow-up. 4. Prominence of the right temporal horn and left lateral ventricle suspicious for entrapment. Electronically Signed   By: Kellie Simmering DO   On: 10/09/2019 19:19   Dg Chest Port 1 View  Result Date: 09/28/2019 CLINICAL DATA:  Left-sided central line placement EXAM: PORTABLE CHEST 1 VIEW COMPARISON:  10/05/2019, 6 a.m. FINDINGS: Interval placement of a left neck vascular catheter, tip positioned near the superior cavoatrial junction. Support apparatus is otherwise unchanged, including endotracheal tube, esophagogastric tube, and large bore right chest tunneled multi lumen vascular catheter. Cardiomegaly with atelectasis or consolidation of the left lung base and diffuse heterogeneous opacity throughout the lungs. IMPRESSION: 1. Interval placement of a left neck vascular catheter, tip positioned near the superior cavoatrial junction. 2.  Otherwise unchanged AP portable examination. Electronically Signed   By: Eddie Candle M.D.   On: 09/17/2019 12:17   Dg Chest Portable 1 View  Result  Date: 09/16/2019 CLINICAL DATA:  Intubation EXAM: PORTABLE CHEST 1 VIEW COMPARISON:  08/19/2019 FINDINGS: Endotracheal tube in good position with tip between the clavicular heads and carina. Dialysis catheter with tip at the upper right atrium. The orogastric tube tip reaches the mid stomach. Cardiomegaly with hazy airspace opacity on both sides. No visible effusion or pneumothorax. IMPRESSION: 1. Unremarkable endotracheal and orogastric tube positioning. 2. Extensive bilateral airspace disease favoring edema. Aspiration is also considered given the presenting symptoms. Electronically Signed   By: Monte Fantasia M.D.   On: 10/05/2019 06:09   Ct Head Code Stroke Wo Contrast  Result Date: 10/09/2019 CLINICAL DATA:  Code stroke.  Altered level of consciousness EXAM: CT HEAD WITHOUT CONTRAST TECHNIQUE: Contiguous axial images were obtained from the base  of the skull through the vertex without intravenous contrast. COMPARISON:  Brain MRI and MRA 06/22/2019 and 06/23/2019 FINDINGS: Brain: 6.8 x 3.7 x 5 cm acute hemorrhage in the right basal ganglia. This volume include some low-density areas which could be un clotted blood or parenchyma. There is intraventricular extension extending to the level of the fourth ventricle. Prominent local mass effect with midline shift measuring 1 cm. The ventricles are dilated. There is a background of severe chronic small vessel ischemia in the deep cerebral white matter. 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 Vascular: Negative Skull: Negative Sinuses/Orbits: Negative Other: Critical Value/emergent results were called by telephone at the time of interpretation on 10/12/2019 at 5:46 am to providerAroor, who is already aware. ASPECTS ([place] Stroke Program Early CT Score) Not scored with these findings IMPRESSION: 1. ~ 60 cc hematoma in the right basal ganglia with intraventricular extension, hydrocephalus, and 1 cm of midline shift. The location and history is compatible with  hypertensive hemorrhage. There was a negative intracranial MRA 06/23/2019. 2. Background advanced chronic white matter disease. Electronically Signed   By: Monte Fantasia M.D.   On: 10/10/2019 05:52   Transthoracic Echocardiogram  1. The left ventricle has hyperdynamic systolic function, with an ejection fraction of >65%. The cavity size was decreased. There is severe concentric left ventricular hypertrophy. Left ventricular diastolic parameters were normal. No evidence of left  ventricular regional wall motion abnormalities.  2. The right ventricle has normal systolic function. The cavity was normal. There is no increase in right ventricular wall thickness. Right ventricular systolic pressure could not be assessed.  3. Small pericardial effusion.  4. No evidence of mitral valve stenosis.  5. The aortic valve is tricuspid. No stenosis of the aortic valve.   PHYSICAL EXAM    Blood pressure 131/74, pulse 88, temperature 97.9 F (36.6 C), resp. rate (!) 24, weight 76 kg, SpO2 100 %. General: Appears well-developed; young African-American male critically ill Psych: unable to assess Eyes: No scleral injection Head: Normocephalic; evd c/d/i Cardiovascular: Normal rate and regular rhythm. Respiratory: intubated, vented, breath sounds normal to anterior ascultation GI: Soft. No distension. There is no tenderness.  Skin: Warm, dry; clubbing of toes/fingers noted  Neurological Examination Mental Status: Comatose with eyes closed- no eye opening to [place] stim. Not following commands.  Sluggish OCR, trace corneal reflexes bilaterally.  Pupils sluggishly reactive bilaterally. Motor/Sensory: Semipurposeful flexion response in the right upper and lower extremity and to a lesser degree in the left lower extremity.  Only trace response in the left upper extremity.   Plantars: mute Cerebellar: Unable  GCS: 4  ASSESSMENT/PLAN [name] is a 33 y.o. male with history of hypertension,  end-stage renal disease on ASA+Plavix for recent stroke presents to the emergency department obtunded with left-sided plegia and right gaze deviation. CT head showed 60 cc hematoma in the right basal ganglia with intraventricular extension, hydrocephalus, and 1 cm of midline shift. Patient was emergency intubated mainly for airway protection. EVD was emergently placed t bedside by NSGY for hydrocephalus. Patient also was given desmopressin given the fact that he is using Plavix with small studies supporting its use in the setting of antiplatelets diminishing incidence of hemorrhage expansion.   Hypertensive R basal ganglia ICH w/ IVH w/ edema, L midline shift s/p EVD  Resultant  Coma, 8ml, ICH score 4  Code Stroke CT Head -  massive Rt BG ICH with IVH and hypdrocephalus  Repeat CT head R basal ganglia hemorrhage unchanged. Mass effect.  30mm L midline shift. IVH increased in R lateral ventricle atrium. R EVD  Repeat CT head pending   2D Echo - EF >65%. No source of embolus   Lacey Jensen Virus 2  neg  LDL - 158 in July  HgbA1c 4.9 in July   UDS neg  VTE prophylaxis - SCDs only d/t bleed  ASA + Plavix prior to admission, now not indicated  Therapy recommendations:  pending  Disposition:  Pending  High risk of M&M. Poor prognosis d/w wife at bedside. For now, she wants aggressive care  Cerebral Edema  EVD placed in ED (Ostergard)  EVD now draining, not previoulsy  Started on 3% saline protocol w/ silver bullet this am  Has central line  Acute Respiratory Failure  Intubated  Sedated   Unable to extubated d/t mental status  CCM on board  Hypertensive Emergency  BP as high as 194/105 on arrival  Home BP meds: none  Current BP meds: Cleviprex  Unstable . Goal SBP <160   . Agree with resuming home meds per tube w/ HD . Long-term BP goal normotensive  Hyperlipidemia  Home Lipid lowering medication: none   LDL 158 in July  Hold statin given hemorrhage    Dysphagia . Secondary to stroke . NPO . Start TB    Other Stroke Risk Factors  Cigarette smoker, unable to be advised to stop smoking  ETOH abuse; wife reports he quit in July  Hx stroke/TIA  06/2019  Numerous bilateral anterior and posterior circulation infarcts throughout secondary to small vessel disease.  Abnormal MRI showing edema of the brainstem cytotoxic edema likely from posterior reversible encephalopathy syndrome versus osmotic demyelination given his borderline nutritional status, hyponatremia, renal failure and heavy alcohol intake  Substance Abuse + Cocaine abuse; wife reports he quit in July. UDS neg  Other Active Problems  ESRD on HD MWF. Renal consulted and following. Recommend to AVOID HEPARIN during HD  Anemia d/t CKD  Hospital day # 1  Continue hypertonic saline with serum sodium goal 150-155.  Give 30 cc of 23.4% saline bolus x1.  Continue strict control of hypertension with systolic blood pressure goal now below 160.  Start tube feedings for nutrition and medications.  Continue ventriculostomy drainage and appreciate neurosurgical help.  Critical care team to help manage ventilator and medical issues.  Patient prognosis is extremely poor.  Discussed with patient's wife at the bedside about his extremely poor prognosis and likelihood of survival and making meaningful improvement are negligible.  She however wants to continue full support.  Discussed with Dr. Kyung Rudd critical care medicine and Dr. Jonnie Finner from nephrology This patient is critically ill and at significant risk of neurological worsening, death and care requires constant monitoring of vital signs, hemodynamics,respiratory and cardiac monitoring, extensive review of multiple databases, frequent neurological assessment, discussion with family, other specialists and medical decision making of high complexity.I have made any additions or clarifications directly to the above note.This critical care time does not  reflect procedure time, or teaching time or supervisory time of PA/NP/Med Resident etc but could involve care discussion time.  I spent 30 minutes of neurocritical care time  in the care of  this patient.     To contact Stroke Continuity provider, please refer to [email or web address] After hours, contact General Neurology

## 2019-10-12 NOTE — Progress Notes (Signed)
Patient's belonging given to patient's wife.  Belongings include one ring, two earrings, and clothing.

## 2019-10-12 NOTE — Progress Notes (Signed)
PT Cancellation Note  Patient Details Name: William [name] MRN: [id number] DOB: [birth year]   Cancelled Treatment:    Reason Eval/Treat Not Completed: Patient not medically ready.  Will attempt to see pt 10/30 as able. 10/12/2019  Donnella Sham, [facility] [phone]  (pager) [phone]  (office)   Tessie Fass Michaell Grider 10/12/2019, 12:28 PM

## 2019-10-12 NOTE — Progress Notes (Addendum)
NAME:  Armas Vinton, MRN:  [id number], DOB:  04-Sep-1986, LOS: 1 ADMISSION DATE:  09/15/2019, CONSULTATION DATE:  09/24/2019 REFERRING MD:  Ward  CHIEF COMPLAINT:  AMS.   Brief History    33 y.o. male with PMH of HTN, ESRD and cocaine use with previous stroke on ASA and plavix who woke up 10/28 feeling short of breath and wife noted slurred speech and facial droop.  CT showed large R hemorrhage, pt deteriorated and required intubation.  Pt given DDAVP, neurosurgery and neurology consulted and PCCM consulted.  Past Medical History  CVA 06/2019,HTN,HLD,ESRD iHD TTHS,Cocaine abuse.,Anemia ,Right AV graft placed 08/15/2019  Significant Hospital Events   10/28 > admit, intubated with large R BG ICH; IVD in parenchyma of brain but felt w/ coagulopathy more risk than benefit to re-attempt   10/28: posturing. Still hypertensive. DBP goal <160. Starting on HT saline.  Consults:  Neurosurgery PCCM. neprhology  Procedures:  ETT 10/28 >> Left IJ CVL 10/28>>>  Significant Diagnostic Tests:  [facility] 10/28 >> right BG ICH with Intraventricular extension and MLS (formal read pending). CT brain 10/28: 1. A 6.8 x 3.6 x 4.9 cm parenchymal hemorrhage centered within the right basal ganglia has not significantly changed in size. Mass effect with partial effacement of ventricular system and unchanged 10 mm leftward midline shift. 2. Redemonstrated intraventricular extension of hemorrhage. Hemorrhage within the right lateral ventricle atrium has increased from prior exam. 3. Interval placement of a right frontal approach ventricular catheter. It is unclear whether the catheter tip terminates within the right basal ganglia or foramen of Monro. However, there are small foci of pneumocephalus within the right frontal horn Micro Data:  SARS CoV2 10/28 >> neg  Antimicrobials:  None  Interim history/subjective:  Still hypertensive. Postures to noxious stim  Objective:  Blood pressure 131/74, pulse 88, temperature  97.9 F (36.6 C), resp. rate (Abnormal) 24, weight 76 kg, SpO2 100 %.    Vent Mode: PSV;CPAP FiO2 (%):  [40 %-50 %] 40 % Set Rate:  [18 bmp] 18 bmp Vt Set:  [560 mL] 560 mL PEEP:  [5 cmH20] 5 cmH20 Pressure Support:  [15 cmH20] 15 cmH20 Plateau Pressure:  [15 cmH20-19 cmH20] 17 cmH20   Intake/Output Summary (Last 24 hours) at 10/12/2019 0933 Last data filed at 10/12/2019 0900 Gross per 24 hour  Intake 1679.06 ml  Output 1224 ml  Net 455.06 ml   Filed Weights   09/30/2019 0611 10/12/19 0436  Weight: 78 kg 76 kg    Examination General: This is a 33 year old male patient who is currently sedated on the ventilator.  He does exhibit intermittent posturing but is not purposeful HEENT orally intubated pupils equal reactive no JVD left IJ triple-lumen catheter in place Pulmonary: Clear to auscultation diminished bases Cardiac: Regular rate and rhythm Abdomen: Soft nontender Extremities: Warm and dry Neuro: Sedated, postures with noxious stimulus sedated  Assessment & Plan:   Right basal ganglia intra cerebral hemorrhage, complicated by intraventricular bleeding.   Has h/o prior CVA Status post DDAVP given concern about platelet dysfunction in patient with end-stage renal disease -Ventricular drain placed, tip appears to be intraparenchymal Plan Continue serial neuro checks Intraventricular drain per neurosurgery F/u echo Holding asa and plavix given ICH SBP goal <160; using clevaprex Cont coreg VT Holding preadmission: amlodipine, hydralazine, minoxidil and spironolactone for now given HD pending and concern about unstable neuro status Hypertonic saline per neuro  Acute Hypoxic Respiratory Failure  -In the setting of acute Basal ganglia ICH  -Portable  chest x-ray personally reviewed this demonstrates The endotracheal tube to be in satisfactory position does have cardiomegaly, there appears to be some element of right greater than left pulmonary edema this looks a little worse  when comparing initial film prior to IJ placement Arterial blood gas reviewed Plan Continuing full ventilator support VAP bundle PAD protocol RASS goal 0 Follow-up a.m. chest x-ray Watch fever and white blood cell curve Suspect he will need dialysis soon for volume removal  Hx ESRD on HD MWF -Has Right AV graft and right tunneled HD cath in place  Nephro consulted Plan Per nephro Check am chem  Intermittent fluid and electrolyte imbalance: hypokalemia  Plan Trend and replace as indicated  Hx cocaine abuse / Polysubstance abuse  Plan PAD protocol  Supportive care   Anemia. Has had sig hgb drop since admit -? Hemodilution  Plan Repeat CBC May need transfusion   Best Practice:  Diet: NPO. tubefeeds start 10/29 Pain/Anxiety/Delirium protocol (if indicated): Fentanyl PRN / Midazolam PRN.  RASS goal -1. VAP protocol (if indicated): In place. DVT prophylaxis: SCD's. GI prophylaxis: PPI. Glucose control: SSI. Mobility: Bedrest. Code Status: Full Family Communication: Spouse updated at bedside  Disposition: cont supportive care. Full vent support. Serial neuro checks and HT saline per neuro.   My cct 35 minutes.   Erick Colace ACNP-BC [place] Pager # [phone] OR # [phone] if no answer

## 2019-10-13 ENCOUNTER — Inpatient Hospital Stay (HOSPITAL_COMMUNITY): Payer: Medicaid Other

## 2019-10-13 DIAGNOSIS — G911 Obstructive hydrocephalus: Secondary | ICD-10-CM

## 2019-10-13 DIAGNOSIS — E440 Moderate protein-calorie malnutrition: Secondary | ICD-10-CM | POA: Diagnosis not present

## 2019-10-13 DIAGNOSIS — J9601 Acute respiratory failure with hypoxia: Secondary | ICD-10-CM | POA: Diagnosis not present

## 2019-10-13 DIAGNOSIS — G936 Cerebral edema: Secondary | ICD-10-CM

## 2019-10-13 DIAGNOSIS — I610 Nontraumatic intracerebral hemorrhage in hemisphere, subcortical: Secondary | ICD-10-CM | POA: Diagnosis not present

## 2019-10-13 LAB — COMPREHENSIVE METABOLIC PANEL
ALT: 15 U/L (ref 0–44)
AST: 33 U/L (ref 15–41)
Albumin: 3.4 g/dL — ABNORMAL LOW (ref 3.5–5.0)
Alkaline Phosphatase: 54 U/L (ref 38–126)
Anion gap: 13 (ref 5–15)
BUN: 26 mg/dL — ABNORMAL HIGH (ref 6–20)
CO2: 25 mmol/L (ref 22–32)
Calcium: 8.9 mg/dL (ref 8.9–10.3)
Chloride: 107 mmol/L (ref 98–111)
Creatinine, Ser: 8.97 mg/dL — ABNORMAL HIGH (ref 0.61–1.24)
GFR calc Af Amer: 8 mL/min — ABNORMAL LOW (ref >60)
GFR calc non Af Amer: 7 mL/min — ABNORMAL LOW (ref >60)
Glucose, Bld: 119 mg/dL — ABNORMAL HIGH (ref 70–99)
Potassium: 4.9 mmol/L (ref 3.5–5.1)
Sodium: 145 mmol/L (ref 135–145)
Total Bilirubin: 0.7 mg/dL (ref 0.3–1.2)
Total Protein: 6.1 g/dL — ABNORMAL LOW (ref 6.5–8.1)

## 2019-10-13 LAB — CBC
HCT: 24.9 % — ABNORMAL LOW (ref 39.0–52.0)
Hemoglobin: 7.7 g/dL — ABNORMAL LOW (ref 13.0–17.0)
MCH: 29.8 pg (ref 26.0–34.0)
MCHC: 30.9 g/dL (ref 30.0–36.0)
MCV: 96.5 fL (ref 80.0–100.0)
Platelets: 240 10*3/uL (ref 150–400)
RBC: 2.58 MIL/uL — ABNORMAL LOW (ref 4.22–5.81)
RDW: 16.1 % — ABNORMAL HIGH (ref 11.5–15.5)
WBC: 10.2 10*3/uL (ref 4.0–10.5)
nRBC: 0 % (ref 0.0–0.2)

## 2019-10-13 LAB — GLUCOSE, CAPILLARY
Glucose-Capillary: 126 mg/dL — ABNORMAL HIGH (ref 70–99)
Glucose-Capillary: 120 mg/dL — ABNORMAL HIGH (ref 70–99)
Glucose-Capillary: 106 mg/dL — ABNORMAL HIGH (ref 70–99)
Glucose-Capillary: 100 mg/dL — ABNORMAL HIGH (ref 70–99)
Glucose-Capillary: 105 mg/dL — ABNORMAL HIGH (ref 70–99)
Glucose-Capillary: 102 mg/dL — ABNORMAL HIGH (ref 70–99)

## 2019-10-13 LAB — PHOSPHORUS
Phosphorus: 4.4 mg/dL (ref 2.5–4.6)
Phosphorus: 4.5 mg/dL (ref 2.5–4.6)

## 2019-10-13 LAB — SODIUM
Sodium: 147 mmol/L — ABNORMAL HIGH (ref 135–145)
Sodium: 148 mmol/L — ABNORMAL HIGH (ref 135–145)
Sodium: 150 mmol/L — ABNORMAL HIGH (ref 135–145)

## 2019-10-13 LAB — MAGNESIUM
Magnesium: 2.2 mg/dL (ref 1.7–2.4)
Magnesium: 2.2 mg/dL (ref 1.7–2.4)

## 2019-10-13 MED ORDER — MINOXIDIL 2.5 MG PO TABS
5.0000 mg | ORAL_TABLET | Freq: Two times a day (BID) | ORAL | Status: DC
  Administered 2019-10-13 – 2019-10-16 (×7): 5 mg
  Filled 2019-10-13 (×7): qty 2

## 2019-10-13 MED ORDER — PANTOPRAZOLE SODIUM 40 MG PO PACK
40.0000 mg | PACK | Freq: Every day | ORAL | Status: DC
  Administered 2019-10-13 – 2019-10-15 (×3): 40 mg
  Filled 2019-10-13 (×3): qty 20

## 2019-10-13 MED ORDER — AMLODIPINE BESYLATE 10 MG PO TABS
10.0000 mg | ORAL_TABLET | Freq: Every day | ORAL | Status: DC
  Administered 2019-10-13 – 2019-10-24 (×12): 10 mg
  Filled 2019-10-13 (×13): qty 1

## 2019-10-13 MED ORDER — HYDRALAZINE HCL 50 MG PO TABS
100.0000 mg | ORAL_TABLET | Freq: Three times a day (TID) | ORAL | Status: DC
  Administered 2019-10-13 – 2019-10-26 (×32): 100 mg
  Filled 2019-10-13 (×34): qty 2

## 2019-10-13 MED ORDER — SPIRONOLACTONE 25 MG PO TABS
25.0000 mg | ORAL_TABLET | Freq: Every day | ORAL | Status: DC
  Administered 2019-10-13 – 2019-10-24 (×12): 25 mg
  Filled 2019-10-13 (×12): qty 1

## 2019-10-13 MED ORDER — CHLORHEXIDINE GLUCONATE CLOTH 2 % EX PADS
6.0000 | MEDICATED_PAD | Freq: Every day | CUTANEOUS | Status: DC
  Administered 2019-10-15 – 2019-10-16 (×2): 6 via TOPICAL

## 2019-10-13 NOTE — Progress Notes (Signed)
OT Cancellation Note  Patient Details Name: William [name] MRN: [id number] DOB: 12-29-1985   Cancelled Treatment:    Reason Eval/Treat Not Completed: Active bedrest order;Patient not medically ready(EVD / vent) Pt increase activity order as appropriate and OT will hold evaluation at this time.  Richelle Ito, OTR/L  Acute Rehabilitation Services Pager: [phone] Office: [phone] .  10/13/2019, 9:31 AM

## 2019-10-13 NOTE — Progress Notes (Signed)
Neurosurgery Service Progress Note  Subjective: EVD plugged overnight, flushed and drainage resumed, output 173cc over 24h  Objective: Vitals:   10/13/19 0600 10/13/19 0700 10/13/19 0800 10/13/19 0803  BP: 123/65 119/67 (!) 116/59 (!) 162/58  Pulse: 79 80 80 81  Resp: _0 (!) 29  Temp:    99.1 F (37.3 C)  TempSrc:    Axillary  SpO2: 99% 99% 90% 92%  Weight:       Temp (24hrs), Avg:98.5 F (36.9 C), Min:97.7 F (36.5 C), Max:99.3 F (37.4 C)  CBC Latest Ref Rng & Units 10/13/2019 10/12/2019 10/12/2019  WBC 4.0 - 10.5 K/uL 10.2 10.3 7.5  Hemoglobin 13.0 - 17.0 g/dL 7.7(L) 8.0(L) 7.1(L)  Hematocrit 39.0 - 52.0 % 24.9(L) 25.0(L) 21.6(L)  Platelets 150 - 400 K/uL 240 240 205   BMP Latest Ref Rng & Units 10/13/2019 10/12/2019 10/12/2019  Glucose 70 - 99 mg/dL 119(H) - -  BUN 6 - 20 mg/dL 26(H) - -  Creatinine 0.61 - 1.24 mg/dL 8.97(H) - -  BUN/Creat Ratio 9 - 20 - - -  Sodium 135 - 145 mmol/L 145 142 143  Potassium 3.5 - 5.1 mmol/L 4.9 - -  Chloride 98 - 111 mmol/L 107 - -  CO2 22 - 32 mmol/L 25 - -  Calcium 8.9 - 10.3 mg/dL 8.9 - -    Intake/Output Summary (Last 24 hours) at 10/13/2019 4742 Last data filed at 10/13/2019 0800 Gross per 24 hour  Intake 2878.92 ml  Output 2677 ml  Net 201.92 ml    Current Facility-Administered Medications:  .  acetaminophen (TYLENOL) tablet 650 mg, 650 mg, Per Tube, Q6H, Alfonzo Feller, NP, 650 mg at 10/13/19 0515 .  carvedilol (COREG) tablet 25 mg, 25 mg, Per Tube, BID WC, Aroor, Lanice Schwab, MD, 25 mg at 10/13/19 0737 .  chlorhexidine gluconate (MEDLINE KIT) (PERIDEX) 0.12 % solution 15 mL, 15 mL, Mouth Rinse, BID, Aroor, Lanice Schwab, MD, 15 mL at 10/13/19 0737 .  Chlorhexidine Gluconate Cloth 2 % PADS 6 each, 6 each, Topical, Daily, Aroor, Lanice Schwab, MD, 6 each at 10/12/19 0915 .  Chlorhexidine Gluconate Cloth 2 % PADS 6 each, 6 each, Topical, Q0600, Roney Jaffe, MD .  clevidipine (CLEVIPREX) infusion 0.5 mg/mL, 0-21 mg/hr,  Intravenous, Continuous, Garvin Fila, MD, Last Rate: 42 mL/hr at 10/13/19 0800, 21 mg/hr at 10/13/19 0800 .  docusate (COLACE) 50 MG/5ML liquid 100 mg, 100 mg, Per Tube, BID PRN, Merlene Laughter F, NP .  feeding supplement (PRO-STAT SUGAR FREE 64) liquid 30 mL, 30 mL, Per Tube, 5 X Daily, Garvin Fila, MD, 30 mL at 10/13/19 0515 .  feeding supplement (VITAL HIGH PROTEIN) liquid 1,000 mL, 1,000 mL, Per Tube, Q24H, Leonie Man, Pramod S, MD, 1,000 mL at 10/12/19 1331 .  heparin injection 1,000-6,000 Units, 1,000-6,000 Units, Intravenous, PRN, Aroor, Lanice Schwab, MD, 1,000 Units at 10/12/19 2215 .  labetalol (NORMODYNE) injection 20 mg, 20 mg, Intravenous, Q10 min PRN, Alfonzo Feller, NP, 20 mg at 10/13/19 0738 .  MEDLINE mouth rinse, 15 mL, Mouth Rinse, 10 times per day, Aroor, Lanice Schwab, MD, 15 mL at 10/13/19 0648 .  pantoprazole (PROTONIX) injection 40 mg, 40 mg, Intravenous, QHS, Merlene Laughter F, NP, 40 mg at 10/12/19 2356 .  propofol (DIPRIVAN) 1000 MG/100ML infusion, 5-80 mcg/kg/min, Intravenous, Continuous, [name], MD, Last Rate: 16.38 mL/hr at 10/13/19 0800, 35 mcg/kg/min at 10/13/19 0800 .  senna-docusate (Senokot-S) tablet 1 tablet, 1 tablet, Per Tube, BID,  Aroor, Lanice Schwab, MD, 1 tablet at 10/12/19 2356 .  sodium chloride (hypertonic) 3 % solution, , Intravenous, Continuous, Garvin Fila, MD, Last Rate: 75 mL/hr at 10/13/19 0800 .  sodium chloride flush (NS) 0.9 % injection 10-40 mL, 10-40 mL, Intracatheter, Q12H, Aroor, Karena Addison R, MD, 10 mL at 10/12/19 2230 .  sodium chloride flush (NS) 0.9 % injection 10-40 mL, 10-40 mL, Intracatheter, PRN, Aroor, Lanice Schwab, MD   Physical Exam: Intubated, minimal sedation, gaze neutral, pupils pinpoint, unable to get a significant motor response today, +corneals  Assessment & Plan: 33 y.o. man w/ ESRD on ASA/plavix s/p R ICH/IVH s/p R EVD. Rpt CTH w/ malpositioned EVD. Rpt CTH 10/30 ventricles decreased in size  -EVD is functioning -  output 173cc/24h and ventricles decreased on CT. Likely flow down the catheter from the frontal horn. His exam is still very poor despite decompressing his ventricles. Given his very poor neurologic exam and comorbidities, I do not think that it is appropriate to offer shunt placement. Therefore, should the EVD become non-functional, I do not think it is appropriate to replace it. We can leave it in place and draining over the weekend while the family discussions continue.   William [name]  10/13/19 9:28 AM

## 2019-10-13 NOTE — Progress Notes (Signed)
NAME:  William [name], MRN:  [id number], DOB:  [birth year], LOS: 2 ADMISSION DATE:  10/14/2019, CONSULTATION DATE:  09/24/2019 REFERRING MD:  Ward  CHIEF COMPLAINT:  AMS.   Brief History    33 y.o. male with PMH of HTN, ESRD and cocaine use with previous stroke on ASA and plavix who woke up 10/28 feeling short of breath and wife noted slurred speech and facial droop.  CT showed large R hemorrhage, pt deteriorated and required intubation.  Pt given DDAVP, neurosurgery and neurology consulted and PCCM consulted.   Past Medical History  CVA 06/2019,HTN,HLD,ESRD iHD TTHS,Cocaine abuse.,Anemia ,Right AV graft placed 08/15/2019  Significant Hospital Events   10/28 > admit, intubated with large R BG ICH; IVD in parenchyma of brain but felt w/ coagulopathy more risk than benefit to re-attempt   10/28: posturing. Still hypertensive. DBP goal <160. Starting on HT saline.   Consults:  Neurosurgery PCCM. neprhology   Procedures:  ETT 10/28 >> Left IJ CVL 10/28>>>  Significant Diagnostic Tests:  [facility] 10/28 >> right BG ICH with Intraventricular extension and MLS (formal read pending). CT brain 10/28: 1. A 6.8 x 3.6 x 4.9 cm parenchymal hemorrhage centered within the right basal ganglia has not significantly changed in size. Mass effect with partial effacement of ventricular system and unchanged 10 mm leftward midline shift. 2. Redemonstrated intraventricular extension of hemorrhage. Hemorrhage within the right lateral ventricle atrium has increased from prior exam. 3. Interval placement of a right frontal approach ventricular catheter. It is unclear whether the catheter tip terminates within the right basal ganglia or foramen of [place]ro. However, there are small foci of pneumocephalus within the right frontal horn  CT head on 10/30 IMPRESSION: 1. Unchanged right basal ganglia and intraventricular clot. 2. Improved ventriculomegaly. 3. Stable 1 cm leftward midline shift. Micro Data:  SARS CoV2 10/28  >> neg  Antimicrobials:  None  Interim history/subjective:  Blood pressure better controlled-on Cleviprex Grimaces to noxious stimuli on the left side, no response to noxious stimulus on the right  Objective:  Blood pressure 119/67, pulse 80, temperature 99.3 F (37.4 C), temperature source Oral, resp. rate 18, weight 73.9 kg, SpO2 99 %.    Vent Mode: PRVC FiO2 (%):  [40 %] 40 % Set Rate:  [18 bmp] 18 bmp Vt Set:  [560 mL] 560 mL PEEP:  [5 cmH20] 5 cmH20 Pressure Support:  [15 cmH20] 15 cmH20 Plateau Pressure:  [15 cmH20-16 cmH20] 16 cmH20   Intake/Output Summary (Last 24 hours) at 10/13/2019 0744 Last data filed at 10/13/2019 0700 Gross per 24 hour  Intake 4416.01 ml  Output 2678 ml  Net 1738.01 ml   Filed Weights   10/12/19 1850 10/12/19 2230 10/13/19 0500  Weight: 77 kg 74.5 kg 73.9 kg    Examination General: On vent, does not appear to be in extremis HEENT: Endotracheal tube in place, moist oral mucosa Pulmonary: Clear breath sounds bilaterally Cardiac: S1-S2 appreciated Abdomen: Soft, nontender Extremities: Warm and dry Neuro: Sedated, postures with noxious stimulus sedated Reacts to noxious stimuli on the left side-upper and lower extremity  Assessment & Plan:   Right basal ganglia intracerebral hemorrhage, complicated by intraventricular bleeding History of prior CVA -Ventricular drain in place -S/p DDAVP, given concern about platelet dysfunction in patient with end-stage renal disease   Plan: SBP goal less than 160, on Cleviprex, labetalol Hypertonic saline per neuro-sodium still not at goal -On hypertonic saline 3% at 75 cc an hour Plan is to reinitiate home antihypertensives once he  is on dialysis Continue Coreg Aspirin and Plavix on hold Intraventricular drain management per neurosurgery Continue neurochecks  Acute hypoxemic respiratory failure -In the setting of acute basal ganglia intracerebral hemorrhage -Cardiomegaly  Plan: Continue full  vent support-on PRVC 8 cc/kg VAP bundle Sedation for RASS goal 0 Chest x-ray shows improved aeration, retrocardiac infiltrate No fever, no leukocytosis to suggest an infection Not a candidate for extubation  End-stage renal disease on hemodialysis -Has right AV graft and right tunneled HD cath in place -Followed by nephrology  Hypokalemia Plan Replace as needed  History of polysubstance abuse, history of cocaine abuse Plan Supportive measures  Anemia Likely related to hemodilution  May require transfusion Trend CBC Transfuse for more globin less than 7   Prognosis- poor for meaningful recovery  Best Practice:  Diet: Tube feeds Pain/Anxiety/Delirium protocol (if indicated): Fentanyl PRN / Midazolam PRN.  RASS goal 0  VAP protocol (if indicated): In place. DVT prophylaxis: SCDs GI prophylaxis: PPI Glucose control: SSI. Mobility: Bedrest. Code Status: Full Family Communication: Will update Disposition: cont supportive care. Full vent support. Serial neuro checks and HT saline per neuro.   The patient is critically ill with multiple organ systems failure and requires high complexity decision making for assessment and support, frequent evaluation and titration of therapies, application of advanced monitoring technologies and extensive interpretation of multiple databases. Critical Care Time devoted to patient care services described in this note independent of APP/resident time (if applicable)  is 32 minutes.   Sherrilyn Rist MD [facility] Personal pager: [phone] If unanswered, please page CCM On-call: [phone]

## 2019-10-13 NOTE — Progress Notes (Signed)
Pt placed on 100% FI02 for trip to CT.  Patient now back in his room on original FI02 of 40%.  No events to report.

## 2019-10-13 NOTE — Progress Notes (Addendum)
  NEUROSURGERY PROGRESS NOTE   Received call from nursing regarding EVD dysfunction - no drainage for 2 hours. Came by to evaluate. EVD without active drainage, non-pulsatile. EVD flushed with sterile saline. EVD now functional draining bloody CSF. Will continue to monitor.  Ferne Reus, PA-C [facility] [employer]

## 2019-10-13 NOTE — Progress Notes (Signed)
PT Cancellation Note  Patient Details Name: Wiley Bibey MRN: [id number] DOB: 17-Aug-1986   Cancelled Treatment:    Reason Eval/Treat Not Completed: Patient not medically ready; spoke with RN.  Not progressing and likely not ready for PT soon.  Will sign off.  Please consult PT again when stable and able to mobilize.   Reginia Naas 10/13/2019, 10:35 AM  Magda Kiel, [place] [phone] 10/13/2019

## 2019-10-13 NOTE — Progress Notes (Signed)
STROKE TEAM PROGRESS NOTE   INTERVAL HISTORY His wife is at the bedside. She is hopeful that pt is opening his eyes now, she is seeing movement. Is following commands today. Best exam he has had.  Blood pressure adequately controlled.  Serum sodium is yet suboptimal at 145 but patient is getting dialysis which could be contributing to his sodium not rising as much.  He remains on hypertonic saline.  He still remains on ventilatory support for respiratory failure.  Repeat CT scan of the head shows stable appearance of the hemorrhage and edema with some reduction in the ventricular size.  Ventriculostomy seem to be draining well  OBJECTIVE Vitals:   10/13/19 0800 10/13/19 0803 10/13/19 0900 10/13/19 1000  BP: (!) 116/59 (!) 162/58 117/63 132/69  Pulse: 80 81 79 79  Resp: 19 (!) 29 (!) 26 (!) 26  Temp:  99.1 F (37.3 C)    TempSrc:  Axillary    SpO2: 90% 92% 98% 100%  Weight:        CBC:  Recent Labs  Lab 09/25/2019 0540  10/12/19 0858 10/13/19 0515  WBC 7.1   < > 10.3 10.2  NEUTROABS 3.9  --   --   --   HGB 9.6*   < > 8.0* 7.7*  HCT 30.6*   < > 25.0* 24.9*  MCV 93.3   < > 93.3 96.5  PLT 387   < > 240 240   < > = values in this interval not displayed.    Basic Metabolic Panel:  Recent Labs  Lab 10/12/19 0334  10/12/19 1610  10/13/19 0515 10/13/19 0910  NA 138   < > 143   < > 145 147*  K 3.3*  --   --   --  4.9  --   CL 91*  --   --   --  107  --   CO2 28  --   --   --  25  --   GLUCOSE 113*  --   --   --  119*  --   BUN 31*  --   --   --  26*  --   CREATININE 13.78*  --   --   --  8.97*  --   CALCIUM 8.5*  --   --   --  8.9  --   MG 2.3   < > 2.5*  --  2.2  --   PHOS 5.3*   < > 7.4*  --  4.4  --    < > = values in this interval not displayed.    Lipid Panel:     Component Value Date/Time   CHOL 253 (H) 06/23/2019 0301   TRIG 171 (H) 10/12/2019 0334   HDL 67 06/23/2019 0301   CHOLHDL 3.8 06/23/2019 0301   VLDL 28 06/23/2019 0301   LDLCALC 158 (H) 06/23/2019 0301    HgbA1c:  Lab Results  Component Value Date   HGBA1C 4.9 06/23/2019   Urine Drug Screen:     Component Value Date/Time   LABOPIA NONE DETECTED 10/05/2019 0648   COCAINSCRNUR NONE DETECTED 09/15/2019 0648   LABBENZ NONE DETECTED 10/06/2019 0648   AMPHETMU NONE DETECTED 09/24/2019 0648   THCU NONE DETECTED 09/15/2019 0648   LABBARB NONE DETECTED 10/07/2019 0648    Alcohol Level No results found for: ETH  IMAGING Ct Head Wo Contrast  Result Date: 10/13/2019 CLINICAL DATA:  Stroke follow-up EXAM: CT HEAD WITHOUT CONTRAST TECHNIQUE: Contiguous axial  images were obtained from the base of the skull through the vertex without intravenous contrast. COMPARISON:  Two days ago FINDINGS: Brain: Unchanged large and heterogeneous hematoma in the right basal ganglia with rim of edema. Unchanged intraventricular clot extending to the level of the fourth ventricle. Right frontal ventriculostomy in stable position with tip ending at the level of the right anterior thalamus. Pneumocephalus was seen previously suggesting that there is some intraventricular communication. Ventriculomegaly is improved. Midline shift measures 1 cm, stable. Negative for interval infarct. Vascular: Negative Skull: Negative Sinuses/Orbits: Negative IMPRESSION: 1. Unchanged right basal ganglia and intraventricular clot. 2. Improved ventriculomegaly. 3. Stable 1 cm leftward midline shift. Electronically Signed   By: Monte Fantasia M.D.   On: 10/13/2019 05:21   Ct Head Wo Contrast  Result Date: 09/15/2019 CLINICAL DATA:  Intracranial hemorrhage, known, follow-up EXAM: CT HEAD WITHOUT CONTRAST TECHNIQUE: Contiguous axial images were obtained from the base of the skull through the vertex without intravenous contrast. COMPARISON:  Head CT 09/15/2019 FINDINGS: Brain: Again demonstrated is a very large acute parenchymal hemorrhage centered within the right basal ganglia. Persistent regions of hypodensity within the hemorrhage which may  reflect unclotted blood or parenchyma. The dominant hemorrhage has not significantly changed in size since prior exam, again measuring approximately 6.8 x 3.6 x 4.9 cm. Redemonstrated surrounding edema and mass effect with partial effacement of the right lateral and third ventricles. Prominence of the right temporal horn and left lateral ventricle suspicious for entrapment. Unchanged 1 cm leftward midline shift. As before, there is intraventricular extension of hemorrhage to the level of the fourth ventricle. Intraventricular hemorrhage within the atrium of the right lateral ventricle has increased from prior exam (series 3, image 18). Small amount of hemorrhage now layering dependently within the left occipital horn, which may be secondary to redistribution. There has been interval placement of a right frontal approach ventricular catheter. It is unclear whether the tip of the catheter terminates within the right basal ganglia or foramen. However, there are small foci of pneumocephalus within the right frontal horn. Redemonstrated chronic small vessel ischemic disease. Vascular: No hyperdense vessel is identified Skull: No calvarial fracture.  Right frontal burr hole. Sinuses/Orbits: Visualized orbits demonstrate no acute abnormality. Polypoid mucosal thickening within the left maxillary sinus. These results were called by telephone at the time of interpretation on 10/06/2019 at 7:18 pm to provider Dr. James Ivanoff, who verbally acknowledged these results. IMPRESSION: 1. A 6.8 x 3.6 x 4.9 cm parenchymal hemorrhage centered within the right basal ganglia has not significantly changed in size. Mass effect with partial effacement of ventricular system and unchanged 10 mm leftward midline shift. 2. Redemonstrated intraventricular extension of hemorrhage. Hemorrhage within the right lateral ventricle atrium has increased from prior exam. 3. Interval placement of a right frontal approach ventricular catheter. It is unclear  whether the catheter tip terminates within the right basal ganglia or foramen of [place]. However, there are small foci of pneumocephalus within the right frontal horn. Consider short interval CT follow-up. 4. Prominence of the right temporal horn and left lateral ventricle suspicious for entrapment. Electronically Signed   By: Kellie Simmering DO   On: 09/19/2019 19:19   Dg Chest Port 1 View  Result Date: 10/13/2019 CLINICAL DATA:  Acute respiratory failure with hypoxia EXAM: PORTABLE CHEST 1 VIEW COMPARISON:  Two days ago FINDINGS: Endotracheal tube tip just below the clavicular heads. Bilateral central line with tips at the lower SVC. Orogastric tube reaches the stomach. Generally improving aeration. There is  still dense opacity behind the enlarged heart. No visible pneumothorax. IMPRESSION: 1. Unremarkable hardware positioning. 2. Improved aeration but persistent dense opacity behind the heart. Electronically Signed   By: Monte Fantasia M.D.   On: 10/13/2019 06:24   Dg Chest Port 1 View  Result Date: 10/08/2019 CLINICAL DATA:  Left-sided central line placement EXAM: PORTABLE CHEST 1 VIEW COMPARISON:  10/04/2019, 6 a.m. FINDINGS: Interval placement of a left neck vascular catheter, tip positioned near the superior cavoatrial junction. Support apparatus is otherwise unchanged, including endotracheal tube, esophagogastric tube, and large bore right chest tunneled multi lumen vascular catheter. Cardiomegaly with atelectasis or consolidation of the left lung base and diffuse heterogeneous opacity throughout the lungs. IMPRESSION: 1. Interval placement of a left neck vascular catheter, tip positioned near the superior cavoatrial junction. 2.  Otherwise unchanged AP portable examination. Electronically Signed   By: Eddie Candle M.D.   On: 09/20/2019 12:17   Transthoracic Echocardiogram  1. The left ventricle has hyperdynamic systolic function, with an ejection fraction of >65%. The cavity size was decreased.  There is severe concentric left ventricular hypertrophy. Left ventricular diastolic parameters were normal. No evidence of left  ventricular regional wall motion abnormalities.  2. The right ventricle has normal systolic function. The cavity was normal. There is no increase in right ventricular wall thickness. Right ventricular systolic pressure could not be assessed.  3. Small pericardial effusion.  4. No evidence of mitral valve stenosis.  5. The aortic valve is tricuspid. No stenosis of the aortic valve.   PHYSICAL EXAM    Blood pressure 132/69, pulse 79, temperature 99.1 F (37.3 C), temperature source Axillary, resp. rate (!) 26, weight 73.9 kg, SpO2 100 %. General: Appears well-developed; young African-American male critically ill Psych: unable to assess Eyes: No scleral injection Head: Normocephalic; evd c/d/i Cardiovascular: Normal rate and regular rhythm. Respiratory: intubated, vented, breath sounds normal to anterior ascultation GI: Soft. No distension. There is no tenderness.  Skin: Warm, dry; clubbing of toes/fingers noted  Neurological Examination Mental Status: Comatose with eyes closed-partial eye opening to [place] stim.  following simple midline and right-sided body commands.  Sluggish OCR, trace corneal reflexes bilaterally.  Pupils sluggishly reactive bilaterally. Motor/Sensory: purposeful antigravity strength in the right upper and lower extremity and withdrawal in left upper extremity and lower extremity  Plantars: mute Cerebellar: Unable    ASSESSMENT/PLAN [name] is a 33 y.o. male with history of hypertension, end-stage renal disease on ASA+Plavix for recent stroke presents to the emergency department obtunded with left-sided plegia and right gaze deviation. CT head showed 60 cc hematoma in the right basal ganglia with intraventricular extension, hydrocephalus, and 1 cm of midline shift. Patient was emergency intubated mainly for airway protection.  EVD was emergently placed t bedside by NSGY for hydrocephalus. Patient also was given desmopressin given the fact that he is using Plavix with small studies supporting its use in the setting of antiplatelets diminishing incidence of hemorrhage expansion.   Hypertensive R basal ganglia ICH w/ IVH w/ edema, L midline shift s/p EVD  Resultant left hemiplegia 48ml, ICH score 4  Code Stroke CT Head -  massive Rt BG ICH with IVH and hypdrocephalus  Repeat CT head R basal ganglia hemorrhage unchanged. Mass effect. 42mm L midline shift. IVH increased in R lateral ventricle atrium. R EVD  Repeat CT head unchanged R basal ganglia ICH and IVH. Improved ventriculomegaly. Stable 1cm midline shift.  2D Echo - EF >65%. No source of embolus  Sars Corona Virus 2  neg  LDL - 158 in July  HgbA1c 4.9 in July   UDS neg  VTE prophylaxis - SCDs only d/t bleed  ASA + Plavix prior to admission, now not indicated  Therapy recommendations:  Pending (they signed off, will need to reorder when ready)  Disposition:  Pending  Wife very hopeful  Cerebral Edema  EVD placed in ED (Ostergard)  EVD now draining, not previoulsy  Shunt not planned d/t poor neuro exam and comorbidites, will not replace shunt if becomes non-functional,  per NSG.  Started on 3% saline protocol w/ silver bullet 10/29 am  Has central line  [facility]rip [facility]  Na 142-145-147   Goal Na 150-155 (difficult to reach d/t HD)  Acute Hypoxemic Respiratory Failure  Intubated  Sedated   Unable to extubate d/t mental status  CCM on board  Hypertensive Emergency  BP as high as 194/105 on arrival  Home BP meds: norvasc 10, coreg 25, hydralazine 100 tid, sponolactone 25, minoxidil 5 bid  Current BP meds: Cleviprex + resumed home meds . Goal SBP <160   . Stable . Long-term BP goal normotensive  Hyperlipidemia  Home Lipid lowering medication: none   LDL 158 in July  Hold statin given hemorrhage   Dysphagia . Secondary  to stroke . NPO . Has OG . On TF    Other Stroke Risk Factors  Cigarette smoker, unable to be advised to stop smoking  ETOH abuse; wife reports he quit in July  Hx polysubstance abuse, hx cocaine abuse. UDS neg   Hx stroke/TIA  06/2019  Numerous bilateral anterior and posterior circulation infarcts throughout secondary to small vessel disease.  Abnormal MRI showing edema of the brainstem cytotoxic edema likely from posterior reversible encephalopathy syndrome versus osmotic demyelination given his borderline nutritional status, hyponatremia, renal failure and heavy alcohol intake  Substance Abuse + Cocaine abuse; wife reports he quit in July. UDS neg  Other Active Problems  ESRD on HD MWF. Renal consulted and following. Plan HD Saturday as off schedule. Recommend to AVOID HEPARIN during HD  Anemia d/t CKD 8.0-7.7  Hypokalemia, resolved 4.9  Hospital day # 2  Patient seems neurologically slightly improved today and is arousable and following commands on the right side.  He remains plegic on the left.  Continue strict blood pressure control with systolic blood pressure goal below 160, keep normal glycemic and normothermic.  Continue hypertonic saline at the present rate and will not chase the numbers to reach 150-155 as he is getting dialysis which will prevent his serum sodium from rising to goal.  Continue ventilatory support as per critical care team and try to extubate over the next few days as tolerated. Discussed with critical care MD.  Appreciate neurosurgery help with ventriculostomy care.  I had a Long discussion with patient's wife at the bedside and answered questions about his prognosis.  She remains hopeful and wants to continue aggressive support This patient is critically ill and at significant risk of neurological worsening, death and care requires constant monitoring of vital signs, hemodynamics,respiratory and cardiac monitoring, extensive review of multiple databases,  frequent neurological assessment, discussion with family, other specialists and medical decision making of high complexity.I have made any additions or clarifications directly to the above note.This critical care time does not reflect procedure time, or teaching time or supervisory time of PA/NP/Med Resident etc but could involve care discussion time.  I spent 30 minutes of neurocritical care time  in the care  of  this patient.   Antony Contras, MD  To contact Stroke Continuity provider, please refer to [email or web address] After hours, contact General Neurology

## 2019-10-13 NOTE — Progress Notes (Signed)
[facility] Progress Note  Subjective: not responding, 4.4 L / 2.6 yest , +1.7 net yest.   Vitals:   10/13/19 0800 10/13/19 0803 10/13/19 0900 10/13/19 1000  BP: (!) 116/59 (!) 162/58 117/63 132/69  Pulse: 80 81 79 79  Resp: 19 (!) 29 (!) 26 (!) 26  Temp:  99.1 F (37.3 C)    TempSrc:  Axillary    SpO2: 90% 92% 98% 100%  Weight:        Inpatient medications: . acetaminophen  650 mg Per Tube Q6H  . carvedilol  25 mg Per Tube BID WC  . chlorhexidine gluconate (MEDLINE KIT)  15 mL Mouth Rinse BID  . Chlorhexidine Gluconate Cloth  6 each Topical Daily  . Chlorhexidine Gluconate Cloth  6 each Topical Q0600  . feeding supplement (PRO-STAT SUGAR FREE 64)  30 mL Per Tube 5 X Daily  . feeding supplement (VITAL HIGH PROTEIN)  1,000 mL Per Tube Q24H  . mouth rinse  15 mL Mouth Rinse 10 times per day  . pantoprazole (PROTONIX) IV  40 mg Intravenous QHS  . senna-docusate  1 tablet Per Tube BID  . sodium chloride flush  10-40 mL Intracatheter Q12H   . clevidipine 19 mg/hr (10/13/19 1000)  . propofol (DIPRIVAN) infusion 30 mcg/kg/min (10/13/19 1000)  . sodium chloride (hypertonic) 75 mL/hr at 10/13/19 1000   docusate, heparin, labetalol, sodium chloride flush    Exam: Gen unreponsive, intubated , posturing No rash, cyanosis or gangrene Sclera anicteric, throat w/ ETT No jvd or bruits Chest clear bilat to bases RRR no MRG Abd soft ntnd no mass or ascites +bs GU normal male MS no joint effusions or deformity Ext no LE edema R TDC/ RUE AVG B+bruit    Home meds:  - amlodipine 10/ carvedilol 25 bid/ hydralazine 100 tid/ minoxidil 2.5 bid/ spironolactone 25 qd  - pantoprazole 40  - atrovastatin 40/ clopidogrel 75 qd   - prn's/ vitamins/ supplements    Outpt HD: [place] MWF  4h 106mn  73.5kg  2.2 bath  RU AVG (placed 08/15/19) / R TDC  Hep none  - darbe 200 /wk  - hect 3  - venofer 100 x 10 , completed     Assessment/ Plan: 1. Acute intracranial bleed  - w/ midline shift and IVH. SP EVD. Getting 3% saline. Per neuro/ NSurg. 2. ESRD - usual HD MWF. Recent start in Sept 2020. Plan HD Sat off schedule. We will use highest Na+ level (148) to try to help increase Na+ levels.  3. Anemia ckd - Hb up 7.7. Transfuse prn.  4. HTN/volume - BP controlled here getting IV clevidipine, prn IV labetalol and NG coreg 25 bid, goal SBP < 160. Plan UF 2-2.5 L on HD Sat 5. H/o CVA - July 2020        William [name] 10/13/2019, 10:25 AM  Iron/TIBC/Ferritin/ %Sat    Component Value Date/Time   IRON 18 (L) 08/18/2019 2358   TIBC 239 (L) 08/18/2019 2358   FERRITIN 266 08/18/2019 2358   IRONPCTSAT 8 (L) 08/18/2019 2358   Recent Labs  Lab 09/27/2019 0540  10/13/19 0515 10/13/19 0910  NA 136   < > 145 147*  K 3.1*   < > 4.9  --   CL 93*   < > 107  --   CO2 24   < > 25  --   GLUCOSE 158*   < > 119*  --   BUN 23*   < >  26*  --   CREATININE 11.17*   < > 8.97*  --   CALCIUM 9.9   < > 8.9  --   PHOS  --    < > 4.4  --   ALBUMIN 4.2  --  3.4*  --   INR 1.2  --   --   --    < > = values in this interval not displayed.   Recent Labs  Lab 10/13/19 0515  AST 33  ALT 15  ALKPHOS 54  BILITOT 0.7  PROT 6.1*   Recent Labs  Lab 10/13/19 0515  WBC 10.2  HGB 7.7*  HCT 24.9*  PLT 240      

## 2019-10-14 ENCOUNTER — Inpatient Hospital Stay (HOSPITAL_COMMUNITY): Payer: Medicaid Other

## 2019-10-14 DIAGNOSIS — I615 Nontraumatic intracerebral hemorrhage, intraventricular: Secondary | ICD-10-CM

## 2019-10-14 DIAGNOSIS — I161 Hypertensive emergency: Secondary | ICD-10-CM

## 2019-10-14 DIAGNOSIS — R509 Fever, unspecified: Secondary | ICD-10-CM

## 2019-10-14 LAB — URINALYSIS, COMPLETE (UACMP) WITH MICROSCOPIC
Bilirubin Urine: NEGATIVE
Glucose, UA: NEGATIVE mg/dL
Ketones, ur: NEGATIVE mg/dL
Nitrite: NEGATIVE
Protein, ur: >=300 mg/dL — AB
Specific Gravity, Urine: 1.02 (ref 1.005–1.030)
WBC, UA: >50 WBC/hpf — ABNORMAL HIGH (ref 0–5)
pH: 7 (ref 5.0–8.0)

## 2019-10-14 LAB — GLUCOSE, CAPILLARY
Glucose-Capillary: 95 mg/dL (ref 70–99)
Glucose-Capillary: 103 mg/dL — ABNORMAL HIGH (ref 70–99)
Glucose-Capillary: 104 mg/dL — ABNORMAL HIGH (ref 70–99)
Glucose-Capillary: 100 mg/dL — ABNORMAL HIGH (ref 70–99)
Glucose-Capillary: 98 mg/dL (ref 70–99)

## 2019-10-14 LAB — BASIC METABOLIC PANEL
Anion gap: 16 — ABNORMAL HIGH (ref 5–15)
BUN: 56 mg/dL — ABNORMAL HIGH (ref 6–20)
CO2: 20 mmol/L — ABNORMAL LOW (ref 22–32)
Calcium: 8.9 mg/dL (ref 8.9–10.3)
Chloride: 116 mmol/L — ABNORMAL HIGH (ref 98–111)
Creatinine, Ser: 10.99 mg/dL — ABNORMAL HIGH (ref 0.61–1.24)
GFR calc Af Amer: 6 mL/min — ABNORMAL LOW (ref >60)
GFR calc non Af Amer: 5 mL/min — ABNORMAL LOW (ref >60)
Glucose, Bld: 112 mg/dL — ABNORMAL HIGH (ref 70–99)
Potassium: 4.5 mmol/L (ref 3.5–5.1)
Sodium: 152 mmol/L — ABNORMAL HIGH (ref 135–145)

## 2019-10-14 LAB — SODIUM
Sodium: 155 mmol/L — ABNORMAL HIGH (ref 135–145)
Sodium: 148 mmol/L — ABNORMAL HIGH (ref 135–145)
Sodium: 149 mmol/L — ABNORMAL HIGH (ref 135–145)

## 2019-10-14 LAB — CBC
HCT: 24.3 % — ABNORMAL LOW (ref 39.0–52.0)
Hemoglobin: 7.4 g/dL — ABNORMAL LOW (ref 13.0–17.0)
MCH: 30 pg (ref 26.0–34.0)
MCHC: 30.5 g/dL (ref 30.0–36.0)
MCV: 98.4 fL (ref 80.0–100.0)
Platelets: 233 10*3/uL (ref 150–400)
RBC: 2.47 MIL/uL — ABNORMAL LOW (ref 4.22–5.81)
RDW: 16.2 % — ABNORMAL HIGH (ref 11.5–15.5)
WBC: 12 10*3/uL — ABNORMAL HIGH (ref 4.0–10.5)
nRBC: 0 % (ref 0.0–0.2)

## 2019-10-14 MED ORDER — HEPARIN SODIUM (PORCINE) 1000 UNIT/ML IJ SOLN
INTRAMUSCULAR | Status: AC
  Filled 2019-10-14: qty 4

## 2019-10-14 MED ORDER — SODIUM CHLORIDE 0.9 % IV SOLN
1.0000 g | INTRAVENOUS | Status: AC
  Administered 2019-10-14 – 2019-10-18 (×5): 1 g via INTRAVENOUS
  Filled 2019-10-14: qty 10
  Filled 2019-10-14 (×4): qty 1

## 2019-10-14 MED ORDER — HEPARIN SODIUM (PORCINE) 1000 UNIT/ML IJ SOLN: 3.4000 mL | Freq: Once | INTRAMUSCULAR | Status: DC

## 2019-10-14 NOTE — Progress Notes (Signed)
[facility] Progress Note  Subjective: not responding. +3.0 L net yesterday. Wt down which doesn't make sense. Home BP meds started and still getting IV cleviprex   Vitals:   10/14/19 0930 10/14/19 1000 10/14/19 1030 10/14/19 1145  BP:  121/60  (!) 144/58  Pulse: 87 86 88 88  Resp: (!) 25 (!) 23 (!) 25 (!) 28  Temp:      TempSrc:      SpO2: 99% 100% 99% 99%  Weight:        Inpatient medications: . acetaminophen  650 mg Per Tube Q6H  . amLODipine  10 mg Per Tube QHS  . carvedilol  25 mg Per Tube BID WC  . chlorhexidine gluconate (MEDLINE KIT)  15 mL Mouth Rinse BID  . Chlorhexidine Gluconate Cloth  6 each Topical Q0600  . feeding supplement (PRO-STAT SUGAR FREE 64)  30 mL Per Tube 5 X Daily  . feeding supplement (VITAL HIGH PROTEIN)  1,000 mL Per Tube Q24H  . heparin  3.4 mL Intravenous Once  . hydrALAZINE  100 mg Per Tube TID WC  . mouth rinse  15 mL Mouth Rinse 10 times per day  . minoxidil  5 mg Per Tube BID  . pantoprazole sodium  40 mg Per Tube Daily  . senna-docusate  1 tablet Per Tube BID  . sodium chloride flush  10-40 mL Intracatheter Q12H  . spironolactone  25 mg Per Tube Daily   . cefTRIAXone (ROCEPHIN)  IV    . clevidipine 21 mg/hr (10/14/19 0800)  . propofol (DIPRIVAN) infusion 30 mcg/kg/min (10/14/19 0800)  . sodium chloride (hypertonic) 75 mL/hr at 10/14/19 0800   docusate, heparin, labetalol, sodium chloride flush    Exam: Gen unreponsive, intubated , posturing No rash, cyanosis or gangrene Sclera anicteric, throat w/ ETT No jvd or bruits Chest clear bilat to bases RRR no MRG Abd soft ntnd no mass or ascites +bs GU normal male MS no joint effusions or deformity Ext no LE edema R TDC/ RUE AVG B+bruit    Home meds:  - amlodipine 10/ carvedilol 25 bid/ hydralazine 100 tid/ minoxidil 2.5 bid/ spironolactone 25 qd  - pantoprazole 40  - atrovastatin 40/ clopidogrel 75 qd   - prn's/ vitamins/ supplements    Outpt HD: [place]  MWF  4h 7mn  73.5kg  2.2 bath  RU AVG (placed 08/15/19) / R TDC  Hep none  - darbe 200 /wk  - hect 3  - venofer 100 x 10 , completed     Assessment/ Plan: 1. Acute intracranial bleed - w/ midline shift and IVH. SP EVD. Getting 3% saline. Because of hemodialysis, will not likely be able to reach Na+ goals with 3%. Per neuro/ NSurg. 2. ESRD - usual HD MWF. Will try using AVF (using x 2 wks per wife at OP unit). Recent start in Sept 2020. Plan HD today off schedule then resume MWF. We will use highest Na+ dialysate level (148) to try to help increase Na+ levels 3. Anemia ckd - Hb stable in 7-8 range. Will cont outpt darbe 200 ug weekly starting today. Transfuse prn.  4. HTN/volume - home BP's restarted x 4, BP controlled, on clevidipine gtt as well, keeping SBP < 160. Plan UF 2-2.5 L on HD today.  5. H/o CVA - July 2020        Rob  10/14/2019, 12:42 PM  Iron/TIBC/Ferritin/ %Sat    Component Value Date/Time   IRON 18 (L) 08/18/2019 2358  TIBC 239 (L) 08/18/2019 2358   FERRITIN 266 08/18/2019 2358   IRONPCTSAT 8 (L) 08/18/2019 2358   Recent Labs  Lab 10/13/2019 0540  10/13/19 0515  10/13/19 1633  10/14/19 0430 10/14/19 1033  NA 136   < > 145   < >  --    < > 152* 155*  K 3.1*   < > 4.9  --   --   --  4.5  --   CL 93*   < > 107  --   --   --  116*  --   CO2 24   < > 25  --   --   --  20*  --   GLUCOSE 158*   < > 119*  --   --   --  112*  --   BUN 23*   < > 26*  --   --   --  56*  --   CREATININE 11.17*   < > 8.97*  --   --   --  10.99*  --   CALCIUM 9.9   < > 8.9  --   --   --  8.9  --   PHOS  --    < > 4.4  --  4.5  --   --   --   ALBUMIN 4.2  --  3.4*  --   --   --   --   --   INR 1.2  --   --   --   --   --   --   --    < > = values in this interval not displayed.   Recent Labs  Lab 10/13/19 0515  AST 33  ALT 15  ALKPHOS 54  BILITOT 0.7  PROT 6.1*   Recent Labs  Lab 10/14/19 0430  WBC 12.0*  HGB 7.4*  HCT 24.3*  PLT 233

## 2019-10-14 NOTE — Progress Notes (Signed)
Exam stable, ventric patent and draining

## 2019-10-14 NOTE — Progress Notes (Signed)
NAME:  William [name], MRN:  [id number], DOB:  [birth year], LOS: 3 ADMISSION DATE:  10/07/2019, CONSULTATION DATE:  10/10/2019 REFERRING MD:  Ward  CHIEF COMPLAINT:  AMS.   Brief History    33 y.o. male with PMH of HTN, ESRD and cocaine use with previous stroke on ASA and plavix who woke up 10/28 feeling short of breath and wife noted slurred speech and facial droop.  CT showed large R hemorrhage, pt deteriorated and required intubation.  Pt given DDAVP, neurosurgery and neurology consulted and PCCM consulted.   Past Medical History  CVA 06/2019,HTN,HLD,ESRD iHD TTHS,Cocaine abuse.,Anemia ,Right AV graft placed 08/15/2019  Significant Hospital Events   10/28 > admit, intubated with large R BG ICH; IVD in parenchyma of brain but felt w/ coagulopathy more risk than benefit to re-attempt   10/28: posturing. Still hypertensive. DBP goal <160. Starting on HT saline.   Consults:  Neurosurgery PCCM. neprhology   Procedures:  ETT 10/28 >> Left IJ CVL 10/28>>>  Significant Diagnostic Tests:  [facility] 10/28 >> right BG ICH with Intraventricular extension and MLS (formal read pending). CT brain 10/28: 1. A 6.8 x 3.6 x 4.9 cm parenchymal hemorrhage centered within the right basal ganglia has not significantly changed in size. Mass effect with partial effacement of ventricular system and unchanged 10 mm leftward midline shift. 2. Redemonstrated intraventricular extension of hemorrhage. Hemorrhage within the right lateral ventricle atrium has increased from prior exam. 3. Interval placement of a right frontal approach ventricular catheter. It is unclear whether the catheter tip terminates within the right basal ganglia or foramen of [place]ro. However, there are small foci of pneumocephalus within the right frontal horn  CT head on 10/30 IMPRESSION: 1. Unchanged right basal ganglia and intraventricular clot. 2. Improved ventriculomegaly. 3. Stable 1 cm leftward midline shift. Micro Data:  SARS CoV2 10/28  >> neg  Antimicrobials:  None  Interim history/subjective:  Blood pressure better controlled-on Cleviprex   Objective:  Blood pressure 121/60, pulse 88, temperature 100 F (37.8 C), temperature source Axillary, resp. rate (!) 25, weight 71.8 kg, SpO2 99 %.    Vent Mode: PRVC FiO2 (%):  [40 %-50 %] 40 % Set Rate:  [18 bmp] 18 bmp Vt Set:  [560 mL] 560 mL PEEP:  [5 cmH20] 5 cmH20 Pressure Support:  [10 cmH20] 10 cmH20 Plateau Pressure:  [15 cmH20-22 cmH20] 18 cmH20   Intake/Output Summary (Last 24 hours) at 10/14/2019 1057 Last data filed at 10/14/2019 0800 Gross per 24 hour  Intake 3048.14 ml  Output 365 ml  Net 2683.14 ml   Filed Weights   10/12/19 2230 10/13/19 0500 10/14/19 0500  Weight: 74.5 kg 73.9 kg 71.8 kg    Examination General: On vent, NAD HEENT: Endotracheal tube in place, moist oral mucosa sclera anicteric.  Pulmonary: Clear breath sounds bilaterally Cardiac: S1-S2 appreciated Abdomen: Soft, nontender Extremities: Warm and dry Neuro: Sedated, postures with noxious stimulus sedated; small but spont movement of right side Reacts to noxious stimuli on the left side-upper and lower extremity  Assessment & Plan:   Right basal ganglia intracerebral hemorrhage, complicated by intraventricular bleeding History of prior CVA -Ventricular drain in place -S/p DDAVP, given concern about platelet dysfunction in patient with end-stage renal disease   Plan: SBP goal less than 160, on Cleviprex, labetalol Hypertonic saline per neuro-sodium still not at goal -On hypertonic saline 3% at 75 cc an hour Plan is to reinitiate home antihypertensives once he is on dialysis Continue Coreg Aspirin and Plavix on  hold Intraventricular drain management per neurosurgery Continue neurochecks  Acute hypoxemic respiratory failure -In the setting of acute basal ganglia intracerebral hemorrhage -Cardiomegaly  Plan: Continue full vent support-on PRVC 8 cc/kg VAP bundle  Sedation for RASS goal 0 Chest x-ray shows improved aeration, retrocardiac infiltrate No fever, no leukocytosis to suggest an infection Not a candidate for extubation  End-stage renal disease on hemodialysis -Has right AV graft and right tunneled HD cath in place -Followed by nephrology  Hypokalemia Plan Replace as needed  History of polysubstance abuse, history of cocaine abuse Plan Supportive measures  Anemia Likely related to hemodilution  May require transfusion Trend CBC Transfuse for more globin less than 7   Prognosis- poor for meaningful recovery  Best Practice:  Diet: Tube feeds Pain/Anxiety/Delirium protocol (if indicated): Fentanyl PRN / Midazolam PRN.  RASS goal 0  VAP protocol (if indicated): In place. DVT prophylaxis: SCDs GI prophylaxis: PPI Glucose control: SSI. Mobility: Bedrest. Code Status: Full Family Communication: spoke with family this morning Disposition: cont supportive care. Full vent support. Serial neuro checks and HT saline per neuro.   The patient is critically ill with multiple organ systems failure and requires high complexity decision making for assessment and support, frequent evaluation and titration of therapies, application of advanced monitoring technologies and extensive interpretation of multiple databases. I have independently seen and examined the patient, reviewed data, and developed an assessment and plan. A total of 36 minutes were spent in critical care assessment and medical decision making. This critical care time does not reflect procedure time, or teaching time or supervisory time of PA/NP/Med student/Med Resident, etc but could involve care discussion time.  Bonna Gains, MD PhD 10/14/19 11:01 AM

## 2019-10-14 NOTE — Progress Notes (Signed)
STROKE TEAM PROGRESS NOTE   INTERVAL HISTORY His wife and Dr. Maryjean [name] are at the bedside.  Patient still on propofol and Cleviprex.  Will remove A-line.  With decreased propofol, patient was able to follow commands on the right hand, but no other commands.  Slightly open eyes on repetitive stimulation.  On 3% saline, sodium 155.  EVD patent, with 10 cc/h drainage.  Yesterday CT head stable hematoma, will repeat CT tomorrow.  OBJECTIVE Vitals:   10/14/19 0400 10/14/19 0426 10/14/19 0500 10/14/19 0600  BP: 127/64 (!) 164/70 (!) 145/75 131/65  Pulse: 90 91 94 90  Resp: (!) 24 (!) 25 (!) 26 (!) 25  Temp: 99.7 F (37.6 C)     TempSrc: Oral     SpO2: 96% 100% 100% 93%  Weight:   71.8 kg     CBC:  Recent Labs  Lab 09/22/2019 0540  10/13/19 0515 10/14/19 0430  WBC 7.1   < > 10.2 12.0*  NEUTROABS 3.9  --   --   --   HGB 9.6*   < > 7.7* 7.4*  HCT 30.6*   < > 24.9* 24.3*  MCV 93.3   < > 96.5 98.4  PLT 387   < > 240 233   < > = values in this interval not displayed.    Basic Metabolic Panel:  Recent Labs  Lab 10/13/19 0515  10/13/19 1633 10/13/19 2301 10/14/19 0430  NA 145   < >  --  150* 152*  K 4.9  --   --   --  4.5  CL 107  --   --   --  116*  CO2 25  --   --   --  20*  GLUCOSE 119*  --   --   --  112*  BUN 26*  --   --   --  56*  CREATININE 8.97*  --   --   --  10.99*  CALCIUM 8.9  --   --   --  8.9  MG 2.2  --  2.2  --   --   PHOS 4.4  --  4.5  --   --    < > = values in this interval not displayed.    Lipid Panel:     Component Value Date/Time   CHOL 253 (H) 06/23/2019 0301   TRIG 171 (H) 10/12/2019 0334   HDL 67 06/23/2019 0301   CHOLHDL 3.8 06/23/2019 0301   VLDL 28 06/23/2019 0301   LDLCALC 158 (H) 06/23/2019 0301   HgbA1c:  Lab Results  Component Value Date   HGBA1C 4.9 06/23/2019   Urine Drug Screen:     Component Value Date/Time   LABOPIA NONE DETECTED 10/02/2019 0648   COCAINSCRNUR NONE DETECTED 09/29/2019 0648   LABBENZ NONE DETECTED  09/24/2019 0648   AMPHETMU NONE DETECTED 10/12/2019 0648   THCU NONE DETECTED 10/10/2019 0648   LABBARB NONE DETECTED 10/06/2019 0648    Alcohol Level No results found for: ETH  IMAGING  Ct Head Wo Contrast 10/13/2019 IMPRESSION:  1. Unchanged right basal ganglia and intraventricular clot.  2. Improved ventriculomegaly.  3. Stable 1 cm leftward midline shift.   Dg Chest Port 1 View 10/13/2019 IMPRESSION:  1. Unremarkable hardware positioning.  2. Improved aeration but persistent dense opacity behind the heart.   Transthoracic Echocardiogram  1. The left ventricle has hyperdynamic systolic function, with an ejection fraction of >65%. The cavity size was decreased. There is severe concentric left  ventricular hypertrophy. Left ventricular diastolic parameters were normal. No evidence of left  ventricular regional wall motion abnormalities.  2. The right ventricle has normal systolic function. The cavity was normal. There is no increase in right ventricular wall thickness. Right ventricular systolic pressure could not be assessed.  3. Small pericardial effusion.  4. No evidence of mitral valve stenosis.  5. The aortic valve is tricuspid. No stenosis of the aortic valve.   PHYSICAL EXAM     Temp:  [98.6 F (37 C)-100.7 F (38.2 C)] 100 F (37.8 C) (10/31 0800) Pulse Rate:  [85-99] 88 (10/31 1030) Resp:  [23-34] 25 (10/31 1030) BP: (117-189)/(50-83) 121/60 (10/31 1000) SpO2:  [90 %-100 %] 99 % (10/31 1030) Arterial Line BP: (128-187)/(53-92) 149/57 (10/31 1030) FiO2 (%):  [40 %-50 %] 40 % (10/31 0826) Weight:  [71.8 kg] 71.8 kg (10/31 0500)  General - Well nourished, well developed, intubated on sedation.  Ophthalmologic - fundi not visualized due to noncooperation.  Cardiovascular - Regular rate and rhythm.  Neuro - intubated on sedation, eyes open slowly with repetitive stimulation, able to follow simple commands on the right hand, but not following other commands. With  eye opening, eyes in right gaze position, not blinking to visual threat, doll's eyes present, not tracking, PERRL. Corneal reflex absent on the left but present on the right, gag and cough present. Breathing over the vent.  Facial symmetry not able to test due to ET tube.  Tongue protrusion not cooperative. On pain stimulation, no movement of LUE and BLEs, however, RUE proximal 0/5 and distal 2-3/5 with finger movement. DTR 1+ and no babinski. Sensation, coordination and gait not tested.   ASSESSMENT/PLAN [name] is a 33 y.o. male with history of hypertension, end-stage renal disease on ASA+Plavix for recent stroke presents to the emergency department obtunded with left-sided plegia and right gaze deviation. CT head showed 60 cc hematoma in the right basal ganglia with intraventricular extension, hydrocephalus, and 1 cm of midline shift. Patient was emergency intubated mainly for airway protection. EVD was emergently placed at bedside by [place] for hydrocephalus. Patient also was given desmopressin given the fact that he is using Plavix with small studies supporting its use in the setting of antiplatelets diminishing incidence of hemorrhage expansion.   ICH - Hypertensive R basal ganglia ICH w/ IVH w/ edema, hydrocephalus, L midline shift s/p EVD  Resultant left hemiplegia, ICH score 4  Code Stroke CT Head -  massive Rt BG ICH with IVH and hypdrocephalus  Repeat CT head R basal ganglia hemorrhage unchanged. Mass effect. 39mm L midline shift. IVH increased in R lateral ventricle atrium. R EVD  Repeat CT head - 10/13/19 - unchanged R basal ganglia ICH and IVH. Improved ventriculomegaly. Stable 1cm midline shift.  2D Echo - EF >65%. No source of embolus   William [name] Virus 2  neg  LDL - 158 in July  HgbA1c 4.9 in July    UDS neg  VTE prophylaxis - SCDs only d/t bleed  ASA + Plavix prior to admission, now not indicated  Therapy recommendations:  Pending (they signed off, will  need to reorder when ready)  Disposition:  Pending  Wife very hopeful  Cerebral Edema Obstructive hydrocephalus  EVD placed in ED (Ostergard)  EVD patent for now  Shunt not planned d/t poor neuro exam and comorbidites, will not replace shunt if becomes non-functional,  per NSG.  On 3% saline via central line  Na 142-145-147->152  Goal Na 150-155 (  difficult to reach d/t HD)  Acute Hypoxemic Respiratory Failure  Intubated  Sedated   Unable to extubate d/t mental status  CCM on board  Fever   Temp 100.7->100  CXR - haze right LL, pneumonia vs. Atelectasis  UA showed WBC > 50  Urine Cx pending  On rocephine 10/31>>  Hypertensive Emergency  BP as high as 194/105 on arrival  Home BP meds: norvasc 10, coreg 25, hydralazine 100 tid, sponolactone 25, minoxidil 5 bid  Current BP meds: Cleviprex + resumed home meds . Goal SBP <160 by cuff pressure . Stable . Long-term BP goal normotensive  Hx stroke/TIA  06/2019 admitted for confusion and hypertensive encephalopathy.  MRI showed numerous bilateral anterior and posterior circulation infarcts throughout.  MRA head and neck unremarkable.  EF > 65%.  LDL 158, A1c 4.9.  UDS positive for cocaine and THC.   Discharged on DAPT  Hyperlipidemia  Home Lipid lowering medication: none   LDL 158 in July  Hold statin given hemorrhage   Consider statin on discharge  Dysphagia . Secondary to stroke . NPO . Has OG . On TF @20   Tobacco abuse  Current smoker  Smoking cessation counseling will be provided   Other Stroke Risk Factors  ETOH abuse; wife reports he quit in July  Hx polysubstance abuse, hx cocaine abuse. UDS neg this admission  Other Active Problems  ESRD on HD MWF. Renal consulted and following. Plan HD Saturday  Anemia d/t CKD 7.1->8.0->7.7->7.4  Hospital day # 3  This patient is critically ill and at significant risk of neurological worsening, death and care requires constant monitoring of  vital signs, hemodynamics,respiratory and cardiac monitoring, extensive review of multiple databases, frequent neurological assessment, discussion with family, other specialists and medical decision making of high complexity. I had long discussion with wife at bedside, updated pt current condition, treatment plan and potential prognosis, and answered all the questions. She expressed understanding and appreciation. I spent 40 minutes of neurocritical care time  in the care of  this patient.  Rosalin Hawking, MD PhD Stroke Neurology 10/14/2019 3:19 PM   To contact Stroke Continuity provider, please refer to [email or web address] After hours, contact General Neurology

## 2019-10-15 ENCOUNTER — Inpatient Hospital Stay (HOSPITAL_COMMUNITY): Payer: Medicaid Other

## 2019-10-15 DIAGNOSIS — D631 Anemia in chronic kidney disease: Secondary | ICD-10-CM

## 2019-10-15 DIAGNOSIS — Z992 Dependence on renal dialysis: Secondary | ICD-10-CM

## 2019-10-15 LAB — BASIC METABOLIC PANEL
Anion gap: 16 — ABNORMAL HIGH (ref 5–15)
Anion gap: 13 (ref 5–15)
BUN: 49 mg/dL — ABNORMAL HIGH (ref 6–20)
BUN: 57 mg/dL — ABNORMAL HIGH (ref 6–20)
CO2: 23 mmol/L (ref 22–32)
CO2: 22 mmol/L (ref 22–32)
Calcium: 8.8 mg/dL — ABNORMAL LOW (ref 8.9–10.3)
Calcium: 8.6 mg/dL — ABNORMAL LOW (ref 8.9–10.3)
Chloride: 115 mmol/L — ABNORMAL HIGH (ref 98–111)
Chloride: 121 mmol/L — ABNORMAL HIGH (ref 98–111)
Creatinine, Ser: 7.95 mg/dL — ABNORMAL HIGH (ref 0.61–1.24)
Creatinine, Ser: 8.58 mg/dL — ABNORMAL HIGH (ref 0.61–1.24)
GFR calc Af Amer: 9 mL/min — ABNORMAL LOW (ref >60)
GFR calc Af Amer: 8 mL/min — ABNORMAL LOW (ref >60)
GFR calc non Af Amer: 8 mL/min — ABNORMAL LOW (ref >60)
GFR calc non Af Amer: 7 mL/min — ABNORMAL LOW (ref >60)
Glucose, Bld: 110 mg/dL — ABNORMAL HIGH (ref 70–99)
Glucose, Bld: 107 mg/dL — ABNORMAL HIGH (ref 70–99)
Potassium: 4.5 mmol/L (ref 3.5–5.1)
Potassium: 4.8 mmol/L (ref 3.5–5.1)
Sodium: 154 mmol/L — ABNORMAL HIGH (ref 135–145)
Sodium: 156 mmol/L — ABNORMAL HIGH (ref 135–145)

## 2019-10-15 LAB — GLUCOSE, CAPILLARY
Glucose-Capillary: 107 mg/dL — ABNORMAL HIGH (ref 70–99)
Glucose-Capillary: 98 mg/dL (ref 70–99)
Glucose-Capillary: 97 mg/dL (ref 70–99)
Glucose-Capillary: 104 mg/dL — ABNORMAL HIGH (ref 70–99)
Glucose-Capillary: 93 mg/dL (ref 70–99)
Glucose-Capillary: 100 mg/dL — ABNORMAL HIGH (ref 70–99)

## 2019-10-15 LAB — CBC
HCT: 21.3 % — ABNORMAL LOW (ref 39.0–52.0)
HCT: 22.5 % — ABNORMAL LOW (ref 39.0–52.0)
Hemoglobin: 6.4 g/dL — CL (ref 13.0–17.0)
Hemoglobin: 7 g/dL — ABNORMAL LOW (ref 13.0–17.0)
MCH: 29.5 pg (ref 26.0–34.0)
MCH: 30 pg (ref 26.0–34.0)
MCHC: 30 g/dL (ref 30.0–36.0)
MCHC: 31.1 g/dL (ref 30.0–36.0)
MCV: 98.2 fL (ref 80.0–100.0)
MCV: 96.6 fL (ref 80.0–100.0)
Platelets: 235 10*3/uL (ref 150–400)
Platelets: 236 10*3/uL (ref 150–400)
RBC: 2.17 MIL/uL — ABNORMAL LOW (ref 4.22–5.81)
RBC: 2.33 MIL/uL — ABNORMAL LOW (ref 4.22–5.81)
RDW: 16.1 % — ABNORMAL HIGH (ref 11.5–15.5)
RDW: 16.6 % — ABNORMAL HIGH (ref 11.5–15.5)
WBC: 10.3 10*3/uL (ref 4.0–10.5)
WBC: 10.8 10*3/uL — ABNORMAL HIGH (ref 4.0–10.5)
nRBC: 0 % (ref 0.0–0.2)
nRBC: 0 % (ref 0.0–0.2)

## 2019-10-15 LAB — URINE CULTURE

## 2019-10-15 LAB — TRIGLYCERIDES: Triglycerides: 173 mg/dL — ABNORMAL HIGH (ref <150)

## 2019-10-15 LAB — PREPARE RBC (CROSSMATCH)

## 2019-10-15 LAB — SODIUM: Sodium: 160 mmol/L — ABNORMAL HIGH (ref 135–145)

## 2019-10-15 LAB — OCCULT BLOOD X 1 CARD TO LAB, STOOL: Fecal Occult Bld: POSITIVE — AB

## 2019-10-15 MED ORDER — ACETAMINOPHEN 160 MG/5ML PO SOLN
650.0000 mg | Freq: Four times a day (QID) | ORAL | Status: DC
  Administered 2019-10-15: 650 mg
  Filled 2019-10-15: qty 20.3

## 2019-10-15 MED ORDER — SODIUM CHLORIDE 0.9% IV SOLUTION: Freq: Once | INTRAVENOUS | Status: DC

## 2019-10-15 MED ORDER — DARBEPOETIN ALFA 200 MCG/0.4ML IJ SOSY
200.0000 ug | PREFILLED_SYRINGE | Freq: Once | INTRAMUSCULAR | Status: AC
  Administered 2019-10-15: 200 ug via SUBCUTANEOUS
  Filled 2019-10-15: qty 0.4

## 2019-10-15 MED ORDER — ACETAMINOPHEN 160 MG/5ML PO SOLN: 650.0000 mg | Freq: Four times a day (QID) | ORAL | Status: DC

## 2019-10-15 MED ORDER — FENTANYL CITRATE (PF) 100 MCG/2ML IJ SOLN
50.0000 ug | Freq: Once | INTRAMUSCULAR | Status: DC
  Filled 2019-10-15: qty 2

## 2019-10-15 MED ORDER — LABETALOL HCL 5 MG/ML IV SOLN
10.0000 mg | INTRAVENOUS | Status: DC | PRN
  Administered 2019-10-15 – 2019-10-20 (×16): 20 mg via INTRAVENOUS
  Filled 2019-10-15 (×18): qty 4

## 2019-10-15 MED ORDER — DARBEPOETIN ALFA 200 MCG/0.4ML IJ SOSY
200.0000 ug | PREFILLED_SYRINGE | INTRAMUSCULAR | Status: DC
  Administered 2019-10-23: 10:00:00 200 ug via INTRAVENOUS
  Filled 2019-10-15 (×2): qty 0.4

## 2019-10-15 MED ORDER — ACETAMINOPHEN 160 MG/5ML PO SOLN
650.0000 mg | ORAL | Status: DC | PRN
  Administered 2019-10-15 – 2019-10-20 (×11): 650 mg
  Filled 2019-10-15 (×11): qty 20.3

## 2019-10-15 MED ORDER — ACETAMINOPHEN 160 MG/5ML PO SOLN
650.0000 mg | Freq: Four times a day (QID) | ORAL | Status: DC
  Filled 2019-10-15: qty 20.3

## 2019-10-15 MED ORDER — CHLORHEXIDINE GLUCONATE CLOTH 2 % EX PADS
6.0000 | MEDICATED_PAD | Freq: Every day | CUTANEOUS | Status: DC
  Administered 2019-10-15 – 2019-10-19 (×5): 6 via TOPICAL

## 2019-10-15 NOTE — Progress Notes (Addendum)
STROKE TEAM PROGRESS NOTE   INTERVAL HISTORY His wife is at the bedside.  Patient still on propofol but off Cleviprex. As per wife and RN, with decreased propofol, patient was able to follow commands on the right hand and right foot. Currently still on 3% saline, sodium 154.  EVD patent. CT head repeat showed stable hematoma and midline shift as well as decreased ventricle size. However, Hb 6.4 this am and received PRBC, will check stool occult blood.   OBJECTIVE Vitals:   10/15/19 0530 10/15/19 0600 10/15/19 0700 10/15/19 0755  BP: (!) 178/80 (!) 143/64 (!) 142/64   Pulse: 89 85 90 91  Resp: (!) 22 19 20  (!) 22  Temp: 98.1 F (36.7 C) 97.9 F (36.6 C) 98.6 F (37 C)   TempSrc:      SpO2: 100% 96% 100% 100%  Weight:        CBC:  Recent Labs  Lab 09/23/2019 0540  10/14/19 0430 10/15/19 0538  WBC 7.1   < > 12.0* 10.3  NEUTROABS 3.9  --   --   --   HGB 9.6*   < > 7.4* 6.4*  HCT 30.6*   < > 24.3* 21.3*  MCV 93.3   < > 98.4 98.2  PLT 387   < > 233 235   < > = values in this interval not displayed.    Basic Metabolic Panel:  Recent Labs  Lab 10/13/19 0515  10/13/19 1633  10/14/19 0430  10/14/19 2305 10/15/19 0538  NA 145   < >  --    < > 152*   < > 149* 154*  K 4.9  --   --   --  4.5  --   --  4.5  CL 107  --   --   --  116*  --   --  115*  CO2 25  --   --   --  20*  --   --  23  GLUCOSE 119*  --   --   --  112*  --   --  110*  BUN 26*  --   --   --  56*  --   --  49*  CREATININE 8.97*  --   --   --  10.99*  --   --  7.95*  CALCIUM 8.9  --   --   --  8.9  --   --  8.8*  MG 2.2  --  2.2  --   --   --   --   --   PHOS 4.4  --  4.5  --   --   --   --   --    < > = values in this interval not displayed.    Lipid Panel:     Component Value Date/Time   CHOL 253 (H) 06/23/2019 0301   TRIG 173 (H) 10/15/2019 0539   HDL 67 06/23/2019 0301   CHOLHDL 3.8 06/23/2019 0301   VLDL 28 06/23/2019 0301   LDLCALC 158 (H) 06/23/2019 0301   HgbA1c:  Lab Results  Component Value  Date   HGBA1C 4.9 06/23/2019   Urine Drug Screen:     Component Value Date/Time   LABOPIA NONE DETECTED 10/02/2019 0648   COCAINSCRNUR NONE DETECTED 09/14/2019 0648   LABBENZ NONE DETECTED 09/17/2019 0648   AMPHETMU NONE DETECTED 10/07/2019 0648   THCU NONE DETECTED 10/05/2019 0648   LABBARB NONE DETECTED 10/03/2019 0648    Alcohol  Level No results found for: ETH  IMAGING  Ct Head Wo Contrast 10/13/2019 IMPRESSION:  1. Unchanged right basal ganglia and intraventricular clot.  2. Improved ventriculomegaly.  3. Stable 1 cm leftward midline shift.   CT Head Wo Contrast 10/15/2019 IMPRESSION: 1. Unchanged size of large intraparenchymal hematoma centered in the right basal ganglia with intraventricular extension. 2. Decreased size of the lateral ventricles with unchanged 6 mm of leftward midline shift. 3. No new site of hemorrhage.  Dg Chest Port 1 View 10/13/2019 IMPRESSION:  1. Unremarkable hardware positioning.  2. Improved aeration but persistent dense opacity behind the heart.   Transthoracic Echocardiogram  1. The left ventricle has hyperdynamic systolic function, with an ejection fraction of >65%. The cavity size was decreased. There is severe concentric left ventricular hypertrophy. Left ventricular diastolic parameters were normal. No evidence of left  ventricular regional wall motion abnormalities.  2. The right ventricle has normal systolic function. The cavity was normal. There is no increase in right ventricular wall thickness. Right ventricular systolic pressure could not be assessed.  3. Small pericardial effusion.  4. No evidence of mitral valve stenosis.  5. The aortic valve is tricuspid. No stenosis of the aortic valve.   PHYSICAL EXAM     Temp:  [97.2 F (36.2 C)-100.2 F (37.9 C)] 98.6 F (37 C) (11/01 0700) Pulse Rate:  [85-99] 91 (11/01 0755) Resp:  [18-30] 22 (11/01 0755) BP: (117-178)/(50-81) 142/64 (11/01 0700) SpO2:  [94 %-100 %] 100 % (11/01  0755) Arterial Line BP: (125-203)/(50-77) 195/77 (11/01 0700) FiO2 (%):  [40 %] 40 % (11/01 0755) Weight:  [73.3 kg] 73.3 kg (11/01 0500)  General - Well nourished, well developed, intubated on sedation.  Ophthalmologic - fundi not visualized due to noncooperation.  Cardiovascular - Regular rate and rhythm.  Neuro - intubated on sedation, eyes closed, did not follow commands for me due to sedation but per RN he was able to follow simple commands on the right hand and foot once sedation is off, but not following other commands. With forced eye opening, eyes in right gaze position, not blinking to visual threat, doll's eyes present, not tracking, PERRL. Corneal reflex absent on the left but present on the right, gag and cough present. Breathing over the vent.  Facial symmetry not able to test due to ET tube.  Tongue protrusion not cooperative. On pain stimulation, no movement of BUEs and BLEs. DTR 1+ and no babinski. Sensation, coordination and gait not tested.   ASSESSMENT/PLAN Mr. Landun Czarny is a 33 y.o. male with history of hypertension, end-stage renal disease on ASA+Plavix for recent stroke presents to the emergency department obtunded with left-sided plegia and right gaze deviation. CT head showed 60 cc hematoma in the right basal ganglia with intraventricular extension, hydrocephalus, and 1 cm of midline shift. Patient was emergency intubated mainly for airway protection. EVD was emergently placed at bedside by [place] for hydrocephalus. Patient also was given desmopressin given the fact that he is using Plavix with small studies supporting its use in the setting of antiplatelets diminishing incidence of hemorrhage expansion.   ICH - Hypertensive R basal ganglia ICH w/ IVH w/ edema, hydrocephalus, L midline shift s/p EVD  Resultant left hemiplegia, ICH score 4  Code Stroke CT Head -  massive Rt BG ICH with IVH and hypdrocephalus  Repeat CT head R basal ganglia hemorrhage unchanged.  Mass effect. 24mm L midline shift. IVH increased in R lateral ventricle atrium. R EVD  Repeat CT head -  10/13/19 - unchanged R basal ganglia ICH and IVH. Improved ventriculomegaly. Stable 1cm midline shift.  [facility] 10/15/19 - Unchanged size of large intraparenchymal hematoma centered in the right basal ganglia with intraventricular extension.Decreased size of the lateral ventricles with unchanged 6 mm of leftward midline shift.  2D Echo - EF >65%. No source of embolus   EEG pending  Lacey Jensen Virus 2  neg  LDL - 158 in July  HgbA1c 4.9 in July    UDS neg  VTE prophylaxis - SCDs only d/t bleed  ASA + Plavix prior to admission, now not indicated  Therapy recommendations:  Pending (need to reorder when ready)  Disposition:  Pending  Wife very hopeful  Cerebral Edema Obstructive hydrocephalus  EVD placed in ED (Ostergard)  EVD patent for now  Shunt not planned d/t poor neuro exam and comorbidites, will not replace shunt if becomes non-functional,  per NSG.  On 3% saline via central line - 75 cc / hr  Na 142-145-147->152->149->154  Goal Na 150-155  Acute Hypoxemic Respiratory Failure  Intubated  Sedated with propofol  Unable to extubate d/t mental status  CCM on board  Fever   Temp 100.7->100->afebrile  CXR - haze right LL, pneumonia vs. Atelectasis  UA showed WBC > 50  Urine Cx pending  On rocephine 10/31>>  Tylenol PRN  Hypertensive Emergency  BP as high as 194/105 on arrival  Home BP meds: norvasc 10, coreg 25, hydralazine 100 tid, sponolactone 25, minoxidil 5 bid  Current BP meds: Cleviprex PRN + resumed home meds . Goal SBP <160  . Stable on the high end . Nephrology on board . Long-term BP goal normotensive  Hx stroke/TIA  06/2019 admitted for confusion and hypertensive encephalopathy.  MRI showed numerous bilateral anterior and posterior circulation infarcts throughout.  MRA head and neck unremarkable.  EF > 65%.  LDL 158, A1c 4.9.  UDS  positive for cocaine and THC.   Discharged on DAPT  ESRD on HD   MWF schedule  Renal following  Last HD Saturday  Anemia d/t CKD  Hb 7.1->8.0->7.7->7.4->6.4  Received PRBC transfusion  CBC monitoring  Hyperlipidemia  Home Lipid lowering medication: none   LDL 158 in July  Hold statin given hemorrhage   Consider statin on discharge  Dysphagia . Secondary to stroke . NPO . Has OG . On TF @20   Tobacco abuse  Current smoker  Smoking cessation counseling will be provided   Other Stroke Risk Factors  ETOH abuse; wife reports he quit in July  Hx polysubstance abuse, hx cocaine abuse. UDS neg this admission  Other [facility] day # 4  This patient is critically ill and at significant risk of neurological worsening, death and care requires constant monitoring of vital signs, hemodynamics,respiratory and cardiac monitoring, extensive review of multiple databases, frequent neurological assessment, discussion with family, other specialists and medical decision making of high complexity. I had long discussion with wife at bedside, updated pt current condition, treatment plan and potential prognosis, and answered all the questions. She expressed understanding and appreciation. I spent 35 minutes of neurocritical care time  in the care of  this patient.  Rosalin Hawking, MD PhD Stroke Neurology 10/15/2019 5:43 PM    To contact Stroke Continuity provider, please refer to [email or web address] After hours, contact General Neurology

## 2019-10-15 NOTE — Procedures (Signed)
ELECTROENCEPHALOGRAM REPORT   Patient: William [name]       Room #: 4N21C EEG No. ID: [id number] Age: 33 y.o.        Sex: male Referring Physician: Erlinda Hong Report Date:  10/15/2019        Interpreting Physician: Alexis Goodell  History: [name] is an 33 y.o. male with ICH and right EVD  Medications:  Cleviprex, Propofol, Norvasc, Coreg, Rocephin, Apresoline, Aldactone   Conditions of Recording:  This is a 21 channel routine scalp EEG performed with bipolar and monopolar montages arranged in accordance to the international 10/20 system of electrode placement. One channel was dedicated to EKG recording.  The patient is in the intubated and sedated state.  Description:  The background activity is slow and poorly organized.  It consists of a low voltage, polymorphic delta activity that is diffusely distributed and continuous.  Some intermixed slow theta activty can be noted at times as well but is less prominent over the right.  Some occasional right frontal sharp waves can be seen as well.  There was no activation of the background with stimulation. Hyperventilation and intermittent photic stimulation were not performed.    IMPRESSION: This is an abnormal electroencephalogram secondary to focal right hemispheric slowing superimposed on a diffusely slow background.  Occasional right frontal sharp waves are noted as well.   These findings are consistent with the patient's history of right ICH with right EVD placement.  No subclinical seizure activity is noted.      Alexis Goodell, MD Neurology [phone] 10/15/2019, 1:56 PM

## 2019-10-15 NOTE — Progress Notes (Signed)
RT called to pt room for desaturation by RN. RT auscultated pts lungs and found to be rhonchi/coarse crackles. RT suctioned pt w/very little mucus return. Pt secretions very thick, RT suction lavaged pt with good return of secretions w/some mucus plugs, green/yellow purulent secretions w/multiple plugs. Pt sats have returned to 100% at this time. RT will continue to monitor.

## 2019-10-15 NOTE — Progress Notes (Addendum)
[facility] Progress Note  Subjective: not responding, 2.5 L off on HD yesterday, remains at dry wt. Still on 3% saline. Na 154 today. Low Hb > getting prbc's  Vitals:   10/15/19 0800 10/15/19 0900 10/15/19 0921 10/15/19 0937  BP: (!) 158/74 135/60  140/62  Pulse: 90 86 85 83  Resp: (!) _0 Temp: 97.9 F (36.6 C) 97.7 F (36.5 C) (!) 97.5 F (36.4 C) 97.7 F (36.5 C)  TempSrc: Esophageal Esophageal  Esophageal  SpO2: 100% 100% 100% 100%  Weight:        Inpatient medications: . sodium chloride   Intravenous Once  . acetaminophen (TYLENOL) oral liquid 160 mg/5 mL  650 mg Per Tube Q6H  . amLODipine  10 mg Per Tube QHS  . carvedilol  25 mg Per Tube BID WC  . chlorhexidine gluconate (MEDLINE KIT)  15 mL Mouth Rinse BID  . Chlorhexidine Gluconate Cloth  6 each Topical Q0600  . feeding supplement (PRO-STAT SUGAR FREE 64)  30 mL Per Tube 5 X Daily  . feeding supplement (VITAL HIGH PROTEIN)  1,000 mL Per Tube Q24H  . fentaNYL (SUBLIMAZE) injection  50 mcg Intravenous Once  . hydrALAZINE  100 mg Per Tube TID WC  . mouth rinse  15 mL Mouth Rinse 10 times per day  . minoxidil  5 mg Per Tube BID  . pantoprazole sodium  40 mg Per Tube Daily  . senna-docusate  1 tablet Per Tube BID  . sodium chloride flush  10-40 mL Intracatheter Q12H  . spironolactone  25 mg Per Tube Daily   . cefTRIAXone (ROCEPHIN)  IV 1 g (10/14/19 1305)  . clevidipine Stopped (10/14/19 1817)  . propofol (DIPRIVAN) infusion 30 mcg/kg/min (10/15/19 0900)  . sodium chloride (hypertonic) 75 mL/hr at 10/15/19 0900   docusate, heparin, labetalol, sodium chloride flush    Exam: Gen unreponsive, intubated , posturing No rash, cyanosis or gangrene Sclera anicteric, throat w/ ETT No jvd or bruits Chest clear bilat to bases RRR no MRG Abd soft ntnd no mass or ascites +bs GU normal male MS no joint effusions or deformity Ext no LE edema R TDC/ RUE AVG B+bruit    Home meds:  -  amlodipine 10/ carvedilol 25 bid/ hydralazine 100 tid/ minoxidil 2.5 bid spironolactone 25 qd  - pantoprazole 40  - atrovastatin 40/ clopidogrel 75 qd   - prn's/ vitamins/ supplements    Outpt HD: [place] MWF  4h 87mn  73.5kg  2.2 bath  RU AVG (placed 08/15/19) / R TDC  Hep none  - darbe 200 /wk  - hect 3  - venofer 100 x 10 , completed     Assessment/ Plan: 1. Acute intracranial bleed - w/ midline shift and IVH. SP EVD. Getting 3% saline day #4 of total approx 7d per neurology, goal Na+ 150-155. 2. ESRD - usual HD MWF. AVG placed 08/15/19, have been using x 2 wks at OP unit per pt's wife. Used AVG yest.  Will ask [facility] for TDC removal this week. Recent start in Sept 2020.  Next HD Monday. Using higher Na+ dialysate levels to help ^ Na+ level. Goal Na+ 150-155. Used 148 flat on Sat, tomorrow will use 145.  3. Anemia ckd - Hb down today 6.4, getting prbc's. Will resume darbe 200 ug w/ one dose today then weekly on Monday. Transfuse prn.  4. HTN/volume - BP controlled w/ clevidipine gtt + his 4 home BP meds per NG, keeping  SBP < 160. Vol seems good today on exam. Is at dry wt. Getting 3% at 75cc/hr.   5. H/o CVA - July 2020        Rob Willa Brocks 10/15/2019, 10:19 AM  Iron/TIBC/Ferritin/ %Sat    Component Value Date/Time   IRON 18 (L) 08/18/2019 2358   TIBC 239 (L) 08/18/2019 2358   FERRITIN 266 08/18/2019 2358   IRONPCTSAT 8 (L) 08/18/2019 2358   Recent Labs  Lab 09/18/2019 0540  10/13/19 0515  10/13/19 1633  10/15/19 0538  NA 136   < > 145   < >  --    < > 154*  K 3.1*   < > 4.9  --   --    < > 4.5  CL 93*   < > 107  --   --    < > 115*  CO2 24   < > 25  --   --    < > 23  GLUCOSE 158*   < > 119*  --   --    < > 110*  BUN 23*   < > 26*  --   --    < > 49*  CREATININE 11.17*   < > 8.97*  --   --    < > 7.95*  CALCIUM 9.9   < > 8.9  --   --    < > 8.8*  PHOS  --    < > 4.4  --  4.5  --   --   ALBUMIN 4.2  --  3.4*  --   --   --   --   INR 1.2  --   --   --   --   --   --     < > = values in this interval not displayed.   Recent Labs  Lab 10/13/19 0515  AST 33  ALT 15  ALKPHOS 54  BILITOT 0.7  PROT 6.1*   Recent Labs  Lab 10/15/19 0538  WBC 10.3  HGB 6.4*  HCT 21.3*  PLT 235

## 2019-10-15 NOTE — Progress Notes (Signed)
NAME:  William [name], MRN:  [id number], DOB:  1986/07/27, LOS: 4 ADMISSION DATE:  10/04/2019, CONSULTATION DATE:  09/29/2019 REFERRING MD:  Ward  CHIEF COMPLAINT:  AMS.   Brief History    33 y.o. male with PMH of HTN, ESRD and cocaine use with previous stroke on ASA and plavix who woke up 10/28 feeling short of breath and wife noted slurred speech and facial droop.  CT showed large R hemorrhage, pt deteriorated and required intubation.  Pt given DDAVP, neurosurgery and neurology consulted and PCCM consulted.   Past Medical History  CVA 06/2019,HTN,HLD,ESRD iHD TTHS,Cocaine abuse.,Anemia ,Right AV graft placed 08/15/2019  Significant Hospital Events   10/28 > admit, intubated with large R BG ICH; IVD in parenchyma of brain but felt w/ coagulopathy more risk than benefit to re-attempt   10/28: posturing. Still hypertensive. DBP goal <160. Starting on HT saline.   Consults:  Neurosurgery PCCM. neprhology   Procedures:  ETT 10/28 >> Left IJ CVL 10/28>>>  Significant Diagnostic Tests:  [facility] 10/28 >> right BG ICH with Intraventricular extension and MLS (formal read pending). CT brain 10/28: 1. A 6.8 x 3.6 x 4.9 cm parenchymal hemorrhage centered within the right basal ganglia has not significantly changed in size. Mass effect with partial effacement of ventricular system and unchanged 10 mm leftward midline shift. 2. Redemonstrated intraventricular extension of hemorrhage. Hemorrhage within the right lateral ventricle atrium has increased from prior exam. 3. Interval placement of a right frontal approach ventricular catheter. It is unclear whether the catheter tip terminates within the right basal ganglia or foramen of Monro. However, there are small foci of pneumocephalus within the right frontal horn  CT head on 10/30 IMPRESSION: 1. Unchanged right basal ganglia and intraventricular clot. 2. Improved ventriculomegaly. 3. Stable 1 cm leftward midline shift.  CT head 11/1: essentially  unchanged- IMPRESSION: 1. Unchanged size of large intraparenchymal hematoma centered in the right basal ganglia with intraventricular extension. 2. Decreased size of the lateral ventricles with unchanged 6 mm of leftward midline shift. 3. No new site of hemorrhage.  Micro Data:  SARS CoV2 10/28 >> neg  Antimicrobials:  None  Interim history/subjective:  No acute events o/n. To CT early  this a.m for f/u scan   Objective:  Blood pressure (!) 142/64, pulse 91, temperature 98.6 F (37 C), resp. rate (!) 22, weight 73.3 kg, SpO2 100 %.    Vent Mode: PRVC FiO2 (%):  [40 %] 40 % Set Rate:  [18 bmp] 18 bmp Vt Set:  [560 mL] 560 mL PEEP:  [5 cmH20] 5 cmH20 Plateau Pressure:  [15 cmH20-20 cmH20] 19 cmH20   Intake/Output Summary (Last 24 hours) at 10/15/2019 0802 Last data filed at 10/15/2019 0700 Gross per 24 hour  Intake 2680.99 ml  Output 2699 ml  Net -18.01 ml   Filed Weights   10/13/19 0500 10/14/19 0500 10/15/19 0500  Weight: 73.9 kg 71.8 kg 73.3 kg    Examination General: On vent, NAD HEENT: Endotracheal tube in place, moist oral mucosa sclera anicteric.  Pulmonary: Clear breath sounds bilaterally Cardiac: RRR no r/m/g  Abdomen: Soft, nontender Extremities: Warm and dry, 2+ edema in hands Neuro: little to no response noxious stimulus; small but spont movement of right side. Eyes discongugate. Pupils 38mm, sluggishly reactive   Assessment & Plan:   Right basal ganglia intracerebral hemorrhage, complicated by intraventricular bleeding History of prior CVA -Ventricular drain in place -S/p DDAVP, given concern about platelet dysfunction in patient with end-stage renal disease  Plan: SBP goal less than 160, on Cleviprex, labetalol Hypertonic saline per neuro-sodium still not at goal -On hypertonic saline 3% at 75 cc an hour Continue Coreg Aspirin and Plavix on hold Intraventricular drain management per neurosurgery Continue neurochecks  Acute hypoxemic  respiratory failure -In the setting of acute basal ganglia intracerebral hemorrhage -Cardiomegaly  Plan: Continue full vent support-on PRVC 8 cc/kg VAP bundle Sedation for RASS goal 0 Chest x-ray shows improved aeration, retrocardiac infiltrate No fever, no leukocytosis to suggest an infection Not a candidate for extubation  End-stage renal disease on hemodialysis -Has right AV graft and right tunneled HD cath in place -Followed by nephrology -HD 10/31  Hypokalemia Plan Replace as needed  History of polysubstance abuse, history of cocaine abuse Plan Supportive measures  Anemia Transfused this morning; f/u labs pending at time of this note Guaiac stool Trend CBC Transfuse for more globin less than 7   Prognosis- poor for meaningful recovery  Best Practice:  Diet: Tube feeds Pain/Anxiety/Delirium protocol (if indicated): Fentanyl PRN / Midazolam PRN.  RASS goal 0  VAP protocol (if indicated): In place. DVT prophylaxis: SCDs GI prophylaxis: PPI Glucose control: SSI. Mobility: Bedrest. Code Status: Full Family Communication: talked with family on 10/31; she seems to have reasonable expectations Disposition: cont supportive care. Full vent support. Serial neuro checks and HT saline per neuro.   The patient is critically ill with multiple organ systems failure and requires high complexity decision making for assessment and support, frequent evaluation and titration of therapies, application of advanced monitoring technologies and extensive interpretation of multiple databases. I have independently seen and examined the patient, reviewed data, and developed an assessment and plan. A total of 34 minutes were spent in critical care assessment and medical decision making. This critical care time does not reflect procedure time, or teaching time or supervisory time of PA/NP/Med student/Med Resident, etc but could involve care discussion time.  Bonna Gains, MD PhD 10/15/19  8:02 AM

## 2019-10-15 NOTE — Progress Notes (Signed)
[place] Progress Note Patient Name: William [name] DOB: [birth year] MRN: [id number]   Date of Service  10/15/2019  HPI/Events of Note  Hgb drop from 7.4 to 6.4.  On IHD MWF  eICU Interventions  Plan: Transfuse 1 unit pRBC Post-transfusion CBC and BMET to evaluate potassium level given ESRD diagnosis. Will need to monitor closely given volume load.     Intervention Category Major Interventions: Other:  Aubert Choyce 10/15/2019, 6:20 AM

## 2019-10-15 NOTE — Progress Notes (Signed)
EEG complete - results pending 

## 2019-10-15 NOTE — Progress Notes (Signed)
Patient transported to CT and back with no complications.

## 2019-10-15 NOTE — Progress Notes (Signed)
No change in neurologic exam.  Ventriculostomy patent and draining

## 2019-10-15 DEATH — deceased

## 2019-10-16 ENCOUNTER — Inpatient Hospital Stay (HOSPITAL_COMMUNITY): Payer: Medicaid Other

## 2019-10-16 DIAGNOSIS — D500 Iron deficiency anemia secondary to blood loss (chronic): Secondary | ICD-10-CM

## 2019-10-16 LAB — GLUCOSE, CAPILLARY
Glucose-Capillary: 99 mg/dL (ref 70–99)
Glucose-Capillary: 102 mg/dL — ABNORMAL HIGH (ref 70–99)
Glucose-Capillary: 91 mg/dL (ref 70–99)
Glucose-Capillary: 103 mg/dL — ABNORMAL HIGH (ref 70–99)
Glucose-Capillary: 111 mg/dL — ABNORMAL HIGH (ref 70–99)
Glucose-Capillary: 125 mg/dL — ABNORMAL HIGH (ref 70–99)
Glucose-Capillary: 125 mg/dL — ABNORMAL HIGH (ref 70–99)

## 2019-10-16 LAB — BASIC METABOLIC PANEL
Anion gap: 13 (ref 5–15)
BUN: 85 mg/dL — ABNORMAL HIGH (ref 6–20)
CO2: 21 mmol/L — ABNORMAL LOW (ref 22–32)
Calcium: 9.1 mg/dL (ref 8.9–10.3)
Chloride: 126 mmol/L — ABNORMAL HIGH (ref 98–111)
Creatinine, Ser: 10.36 mg/dL — ABNORMAL HIGH (ref 0.61–1.24)
GFR calc Af Amer: 7 mL/min — ABNORMAL LOW (ref >60)
GFR calc non Af Amer: 6 mL/min — ABNORMAL LOW (ref >60)
Glucose, Bld: 111 mg/dL — ABNORMAL HIGH (ref 70–99)
Potassium: 5 mmol/L (ref 3.5–5.1)
Sodium: 160 mmol/L — ABNORMAL HIGH (ref 135–145)

## 2019-10-16 LAB — HEMOGLOBIN AND HEMATOCRIT, BLOOD
HCT: 26.6 % — ABNORMAL LOW (ref 39.0–52.0)
HCT: 25 % — ABNORMAL LOW (ref 39.0–52.0)
Hemoglobin: 8.3 g/dL — ABNORMAL LOW (ref 13.0–17.0)
Hemoglobin: 8.8 g/dL — ABNORMAL LOW (ref 13.0–17.0)

## 2019-10-16 LAB — SODIUM
Sodium: 156 mmol/L — ABNORMAL HIGH (ref 135–145)
Sodium: 144 mmol/L (ref 135–145)
Sodium: 145 mmol/L (ref 135–145)

## 2019-10-16 LAB — CBC
HCT: 23.5 % — ABNORMAL LOW (ref 39.0–52.0)
Hemoglobin: 6.9 g/dL — CL (ref 13.0–17.0)
MCH: 29 pg (ref 26.0–34.0)
MCHC: 29.4 g/dL — ABNORMAL LOW (ref 30.0–36.0)
MCV: 98.7 fL (ref 80.0–100.0)
Platelets: 330 10*3/uL (ref 150–400)
RBC: 2.38 MIL/uL — ABNORMAL LOW (ref 4.22–5.81)
RDW: 16.6 % — ABNORMAL HIGH (ref 11.5–15.5)
WBC: 10.9 10*3/uL — ABNORMAL HIGH (ref 4.0–10.5)
nRBC: 0 % (ref 0.0–0.2)

## 2019-10-16 LAB — PREPARE RBC (CROSSMATCH)

## 2019-10-16 MED ORDER — PANTOPRAZOLE SODIUM 40 MG PO PACK
40.0000 mg | PACK | Freq: Two times a day (BID) | ORAL | Status: DC
  Administered 2019-10-16: 09:00:00 40 mg
  Filled 2019-10-16: qty 20

## 2019-10-16 MED ORDER — PRO-STAT SUGAR FREE PO LIQD
30.0000 mL | Freq: Three times a day (TID) | ORAL | Status: DC
  Administered 2019-10-16 – 2019-10-24 (×23): 30 mL
  Filled 2019-10-16 (×23): qty 30

## 2019-10-16 MED ORDER — PANTOPRAZOLE SODIUM 40 MG IV SOLR
40.0000 mg | Freq: Two times a day (BID) | INTRAVENOUS | Status: DC
  Administered 2019-10-20 (×2): 40 mg via INTRAVENOUS
  Filled 2019-10-16 (×2): qty 40

## 2019-10-16 MED ORDER — FENTANYL CITRATE (PF) 100 MCG/2ML IJ SOLN
50.0000 ug | INTRAMUSCULAR | Status: DC | PRN
  Administered 2019-10-16: 100 ug via INTRAVENOUS
  Administered 2019-10-17: 50 ug via INTRAVENOUS
  Administered 2019-10-17 (×2): 100 ug via INTRAVENOUS
  Administered 2019-10-18: 200 ug via INTRAVENOUS
  Administered 2019-10-18: 100 ug via INTRAVENOUS
  Administered 2019-10-18 (×2): 200 ug via INTRAVENOUS
  Administered 2019-10-18 – 2019-10-19 (×2): 100 ug via INTRAVENOUS
  Administered 2019-10-19: 200 ug via INTRAVENOUS
  Administered 2019-10-20: 100 ug via INTRAVENOUS
  Filled 2019-10-16 (×3): qty 2
  Filled 2019-10-16 (×2): qty 4
  Filled 2019-10-16 (×2): qty 2
  Filled 2019-10-16: qty 4
  Filled 2019-10-16 (×6): qty 2
  Filled 2019-10-16: qty 4

## 2019-10-16 MED ORDER — FENTANYL CITRATE (PF) 100 MCG/2ML IJ SOLN
50.0000 ug | INTRAMUSCULAR | Status: DC | PRN
  Administered 2019-10-17: 50 ug via INTRAVENOUS

## 2019-10-16 MED ORDER — SODIUM CHLORIDE 0.9 % IV SOLN
INTRAVENOUS | Status: DC | PRN
  Administered 2019-10-16: 500 mL via INTRAVENOUS

## 2019-10-16 MED ORDER — CLONIDINE HCL 0.1 MG PO TABS
0.1000 mg | ORAL_TABLET | Freq: Three times a day (TID) | ORAL | Status: DC
  Administered 2019-10-16: 19:00:00 0.1 mg via ORAL
  Filled 2019-10-16: qty 1

## 2019-10-16 MED ORDER — SODIUM CHLORIDE 0.9 % IV SOLN
8.0000 mg/h | INTRAVENOUS | Status: AC
  Administered 2019-10-16 – 2019-10-19 (×6): 8 mg/h via INTRAVENOUS
  Filled 2019-10-16 (×8): qty 80

## 2019-10-16 MED ORDER — MIDAZOLAM HCL 2 MG/2ML IJ SOLN
2.0000 mg | INTRAMUSCULAR | Status: DC | PRN
  Administered 2019-10-16: 2 mg via INTRAVENOUS
  Filled 2019-10-16: qty 2

## 2019-10-16 MED ORDER — MIDAZOLAM HCL 2 MG/2ML IJ SOLN
2.0000 mg | INTRAMUSCULAR | Status: DC | PRN
  Administered 2019-10-18 – 2019-10-20 (×2): 2 mg via INTRAVENOUS
  Filled 2019-10-16: qty 2

## 2019-10-16 MED ORDER — SODIUM CHLORIDE 0.9% IV SOLUTION
Freq: Once | INTRAVENOUS | Status: AC
  Administered 2019-10-16: 09:00:00 via INTRAVENOUS

## 2019-10-16 MED ORDER — MINOXIDIL 10 MG PO TABS
10.0000 mg | ORAL_TABLET | Freq: Two times a day (BID) | ORAL | Status: DC
  Administered 2019-10-16 – 2019-10-24 (×16): 10 mg
  Filled 2019-10-16 (×17): qty 1

## 2019-10-16 MED ORDER — SODIUM CHLORIDE 0.9 % IV SOLN
80.0000 mg | Freq: Once | INTRAVENOUS | Status: AC
  Administered 2019-10-16: 80 mg via INTRAVENOUS
  Filled 2019-10-16: qty 80

## 2019-10-16 MED ORDER — CLONIDINE HCL 0.1 MG PO TABS
0.1000 mg | ORAL_TABLET | Freq: Three times a day (TID) | ORAL | Status: DC
  Administered 2019-10-16 – 2019-10-18 (×6): 0.1 mg
  Filled 2019-10-16 (×6): qty 1

## 2019-10-16 MED ORDER — CLEVIDIPINE BUTYRATE 0.5 MG/ML IV EMUL
0.0000 mg/h | INTRAVENOUS | Status: DC
  Administered 2019-10-16: 2 mg/h via INTRAVENOUS
  Administered 2019-10-16 (×2): 21 mg/h via INTRAVENOUS
  Administered 2019-10-16: 14 mg/h via INTRAVENOUS
  Administered 2019-10-17: 10.5 mg/h via INTRAVENOUS
  Administered 2019-10-17: 4 mg/h via INTRAVENOUS
  Administered 2019-10-18: 8 mg/h via INTRAVENOUS
  Administered 2019-10-18: 14 mg/h via INTRAVENOUS
  Administered 2019-10-18: 16 mg/h via INTRAVENOUS
  Administered 2019-10-18: 14 mg/h via INTRAVENOUS
  Administered 2019-10-18: 10 mg/h via INTRAVENOUS
  Administered 2019-10-18 (×2): 16 mg/h via INTRAVENOUS
  Filled 2019-10-16 (×13): qty 50
  Filled 2019-10-16: qty 100
  Filled 2019-10-16 (×2): qty 50

## 2019-10-16 MED ORDER — VITAL 1.5 CAL PO LIQD
1000.0000 mL | ORAL | Status: DC
  Administered 2019-10-16 – 2019-10-18 (×4): 1000 mL
  Filled 2019-10-16 (×5): qty 1000

## 2019-10-16 NOTE — Progress Notes (Signed)
NAME:  William [name], MRN:  [id number], DOB:  [birth year], LOS: 5 ADMISSION DATE:  09/26/2019, CONSULTATION DATE:  09/30/2019 REFERRING MD:  Ward  CHIEF COMPLAINT:  AMS.   Brief History    33 y.o. male with PMH of HTN, ESRD and cocaine use with previous stroke on ASA and plavix who woke up 10/28 feeling short of breath and wife noted slurred speech and facial droop.  CT showed large R hemorrhage, pt deteriorated and required intubation.  Pt given DDAVP, neurosurgery and neurology consulted and PCCM consulted.   Past Medical History  CVA 06/2019,HTN,HLD,ESRD iHD TTHS,Cocaine abuse.,Anemia ,Right AV graft placed 08/15/2019  Significant Hospital Events   10/28 > admit, intubated with large R BG ICH; IVD in parenchyma of brain but felt w/ coagulopathy more risk than benefit to re-attempt   10/28: posturing. Still hypertensive. DBP goal <160. Starting on HT saline.   10/1- 1 unit PRBC 10/2- 3% saline held, Na 160, HB drifting down. Off sedation on PSV weans 5/5 and following some commands  Consults:  Neurosurgery PCCM. neprhology   Procedures:  ETT 10/28 >> Left IJ CVL 10/28>>>  Significant Diagnostic Tests:  [facility] 10/28 >> right BG ICH with Intraventricular extension and MLS (formal read pending). CT brain 10/28: 1. A 6.8 x 3.6 x 4.9 cm parenchymal hemorrhage centered within the right basal ganglia has not significantly changed in size. Mass effect with partial effacement of ventricular system and unchanged 10 mm leftward midline shift. 2. Redemonstrated intraventricular extension of hemorrhage. Hemorrhage within the right lateral ventricle atrium has increased from prior exam. 3. Interval placement of a right frontal approach ventricular catheter. It is unclear whether the catheter tip terminates within the right basal ganglia or foramen of Monro. However, there are small foci of pneumocephalus within the right frontal horn  CT head on 10/30 IMPRESSION: 1. Unchanged right basal ganglia and  intraventricular clot. 2. Improved ventriculomegaly. 3. Stable 1 cm leftward midline shift.  CT head 11/1: essentially unchanged- IMPRESSION: 1. Unchanged size of large intraparenchymal hematoma centered in the right basal ganglia with intraventricular extension. 2. Decreased size of the lateral ventricles with unchanged 6 mm of leftward midline shift. 3. No new site of hemorrhage.  Micro Data:  SARS CoV2 10/28 >> neg  Antimicrobials:  None  Interim history/subjective:  CT head yesterday with no change in intracranial hemorrhage.  Normal saline held due to high sodium Hemoglobin lower at 6.9  Objective:  Blood pressure (!) 168/93, pulse 93, temperature 98.4 F (36.9 C), temperature source Esophageal, resp. rate (!) 21, weight 76.3 kg, SpO2 99 %.    Vent Mode: PSV;CPAP FiO2 (%):  [40 %] 40 % Set Rate:  [18 bmp] 18 bmp Vt Set:  [560 mL] 560 mL PEEP:  [5 cmH20] 5 cmH20 Pressure Support:  [5 cmH20-8 cmH20] 5 cmH20 Plateau Pressure:  [18 cmH20-19 cmH20] 19 cmH20   Intake/Output Summary (Last 24 hours) at 10/16/2019 0823 Last data filed at 10/16/2019 0736 Gross per 24 hour  Intake 1958.68 ml  Output 231 ml  Net 1727.68 ml   Filed Weights   10/14/19 0500 10/15/19 0500 10/16/19 0500  Weight: 71.8 kg 73.3 kg 76.3 kg    Examination: Gen:      No acute distress HEENT:  EOMI, sclera anicteric, ETT Neck:     No masses; no thyromegaly Lungs:    Clear to auscultation bilaterally; normal respiratory effort CV:         Regular rate and rhythm; no murmurs Abd:      +  bowel sounds; soft, non-tender; no palpable masses, no distension Ext:    No edema; adequate peripheral perfusion Skin:      Warm and dry; no rash Neuro: Opens eyes to stimulus.  Per nurses been following commands on the right    Assessment & Plan:   Right basal ganglia intracerebral hemorrhage, complicated by intraventricular bleeding History of prior CVA -Ventricular drain in place -S/p DDAVP, given concern  about platelet dysfunction in patient with end-stage renal disease   Plan: SBP goal less than 160.  Cleviprex, labetalol as needed Holding hypertonic saline.  Follow sodium for goal 150-155 Continue Coreg Aspirin and Plavix on hold Intraventricular drain management per neurosurgery Continue neurochecks  Acute hypoxemic respiratory failure -In the setting of acute basal ganglia intracerebral hemorrhage -Cardiomegaly  Plan: Tolerating pressure support weans Would like him to be more awake before attempting extubation. Follow intermittent chest x-ray  End-stage renal disease on hemodialysis -Has right AV graft and right tunneled HD cath in place -Followed by nephrology for HD  History of polysubstance abuse, history of cocaine abuse Plan Supportive measures  Anemia, guaiac positive stool EGD in September shows duodenitis with erosion Transfuse for hemoglobin less than 7 Change PPI to twice daily We will consult GI    Prognosis is guarded.  Best Practice:  Diet: Tube feeds Pain/Anxiety/Delirium protocol (if indicated): Fentanyl PRN / Midazolam PRN.  RASS goal 0  VAP protocol (if indicated): In place. DVT prophylaxis: SCDs GI prophylaxis: PPI Glucose control: SSI. Mobility: Bedrest. Code Status: Full Family Communication: talked with family on 10/31; she seems to have reasonable expectations Disposition: cont supportive care. Full vent support. Serial neuro checks and HT saline per neuro.   The patient is critically ill with multiple organ system failure and requires high complexity decision making for assessment and support, frequent evaluation and titration of therapies, advanced monitoring, review of radiographic studies and interpretation of complex data.   Critical Care Time devoted to patient care services, exclusive of separately billable procedures, described in this note is 35 minutes.   Marshell Garfinkel MD [place] [facility] Pager [phone]  If no answer call [phone] 10/16/2019, 8:37 AM

## 2019-10-16 NOTE — Progress Notes (Signed)
Rounded with Dr. Zada Finders, verbal order to clamp EVD.  Drain clamp at 0736.

## 2019-10-16 NOTE — Progress Notes (Signed)
Neurosurgery notified that patient is not follow commands as consistently as before EVD clamped this morning.  Verbal order to unclamp drain and assess patient.  RN to continue to monitor.

## 2019-10-16 NOTE — Progress Notes (Signed)
Nutrition Follow-up  DOCUMENTATION CODES:   Non-severe (moderate) malnutrition in context of chronic illness  INTERVENTION:   Initiate Vital 1.5 @ 50 ml/hr (1200 ml/day) via OG tube 30 ml Prostat TID  Provides: 2100 kcal, 126 grams protein, and 916 ml free water.   D/C Vital High Protein  NUTRITION DIAGNOSIS:   Moderate Malnutrition related to chronic illness(ESRD on HD) as evidenced by moderate fat depletion, mild muscle depletion, moderate muscle depletion, percent weight loss(13.6% weight loss in 3 months).  Ongoing.   GOAL:   Patient will meet greater than or equal to 90% of their needs  Progressing.   MONITOR:   Vent status, Labs, Weight trends, TF tolerance, Skin, I & O's  REASON FOR ASSESSMENT:   Ventilator, Consult Enteral/tube feeding initiation and management  ASSESSMENT:   33 year old male who presented to the ED on 10/28 with sudden onset facial droop, slurred speech, left sided weakness. PMH of HTN, ESRD on HD, HLD, prior cocaine abuse. Pt found to have [place] with IVH, hydrocephalus and intubated for airway protection.  Pt discussed during ICU rounds and with RN.  Per RN EVD clamped and cleviprex restarted   Patient is currently intubated on ventilator support MV: 13.7 L/min Temp (24hrs), Avg:99 F (37.2 C), Min:97.1 F (36.2 C), Max:100.4 F (38 C)  Propofol:off  Cleviprex: 4 ml/hr provides: 192 kcal  Medications reviewed and include: senokot-s Labs reviewed: Na 160 (3% now off), hemoglobin: 6.9 (L) EVD: 239 ml last 24 hrs, now clamped   TF via OGT: Vital High Protein @ 20 ml/hr (480 ml/day) via OG tube; - Pro-stat 30 ml 5 times daily Tube feeding regimen provides 980 kcal, 117 grams of protein   Diet Order:   Diet Order            Diet NPO time specified  Diet effective now              EDUCATION NEEDS:   No education needs have been identified at this time  Skin:  Skin Assessment: Reviewed RN Assessment  Last BM:   11/1  Height:   Ht Readings from Last 1 Encounters:  09/21/19 5\' 9"  (1.753 m)    Weight:   Wt Readings from Last 1 Encounters:  10/16/19 76.3 kg    Ideal Body Weight:  72.7 kg  BMI:  Body mass index is 24.84 kg/m.  Estimated Nutritional Needs:   Kcal:  2153  Protein:  110-130 grams  Fluid:  >/= 1.8 L  Maylon Peppers RD, LDN, CNSC [phone] Pager [phone] After Hours Pager

## 2019-10-16 NOTE — Progress Notes (Signed)
CRITICAL VALUE ALERT  Critical Value:  Hemoglobin 6.9  Date & Time Notied:  2019-11-23 0715  Provider Notified: CCM Eliseo Gum  Orders Received/Actions taken: No new orders at this time.

## 2019-10-16 NOTE — Progress Notes (Signed)
Aranesp dose not given today given yesterday stated per pharmacist. Aranesp dose previously dispensed to this nurse. Pharmacist request that dose be returned to 4[facility] refrigerator and pharmacy team member will collect. Aranesp given to primary nurse TK states will put in unit refrigerator.

## 2019-10-16 NOTE — Progress Notes (Addendum)
[facility] Progress Note   Subjective:   Patient seen and examined at bedside. BP elevated and tachypneic this AM. Saline held for hypernatremia. Hgb down to 6.9 again, GI consulted. Opens eyes to name this AM, able to follow some commands (open eyes, squeeze R hand).   Objective Vitals:   10/16/19 1000 10/16/19 1030 10/16/19 1100 10/16/19 1106  BP: (!) 158/91 (!) 169/99 (!) 168/107   Pulse: 95 97 99 (!) 101  Resp: (!) 28 (!) 30 (!) 33 (!) 29  Temp: 99 F (37.2 C) 99.3 F (37.4 C) 99.5 F (37.5 C)   TempSrc:      SpO2: 96% 98% 98% 98%  Weight:       Physical Exam General: Opens eyes to verbal stimuli, intubated Heart: Slightly tachycardic. RRR, no murmurs, rubs or gallops Lungs: CTA anteriorly without wheezing, rhonchi or rales. + Tachypnea.  Abdomen: Soft, non-distended, +BS Extremities: No LE edema Dialysis Access: R IJ TDC. RUE AVG + bruit  Additional Objective Labs: Basic Metabolic Panel: Recent Labs  Lab 10/12/19 1610  10/13/19 0515  10/13/19 1633  10/15/19 0538 10/15/19 1203 10/15/19 2147 10/16/19 0540 10/16/19 0940  NA 143   < > 145   < >  --    < > 154* 156* 160* 160* 156*  K  --   --  4.9  --   --    < > 4.5 4.8  --  5.0  --   CL  --   --  107  --   --    < > 115* 121*  --  126*  --   CO2  --   --  25  --   --    < > 23 22  --  21*  --   GLUCOSE  --   --  119*  --   --    < > 110* 107*  --  111*  --   BUN  --   --  26*  --   --    < > 49* 57*  --  85*  --   CREATININE  --   --  8.97*  --   --    < > 7.95* 8.58*  --  10.36*  --   CALCIUM  --   --  8.9  --   --    < > 8.8* 8.6*  --  9.1  --   PHOS 7.4*  --  4.4  --  4.5  --   --   --   --   --   --    < > = values in this interval not displayed.   Liver Function Tests: Recent Labs  Lab 09/14/2019 0540 10/13/19 0515  AST 20 33  ALT 16 15  ALKPHOS 70 54  BILITOT 0.8 0.7  PROT 7.1 6.1*  ALBUMIN 4.2 3.4*   CBC: Recent Labs  Lab 09/19/2019 0540  10/13/19 0515 10/14/19 0430  10/15/19 0538 10/15/19 1200 10/16/19 0540  WBC 7.1   < > 10.2 12.0* 10.3 10.8* 10.9*  NEUTROABS 3.9  --   --   --   --   --   --   HGB 9.6*   < > 7.7* 7.4* 6.4* 7.0* 6.9*  HCT 30.6*   < > 24.9* 24.3* 21.3* 22.5* 23.5*  MCV 93.3   < > 96.5 98.4 98.2 96.6 98.7  PLT 387   < > 240 233 235 236 330   < > =  values in this interval not displayed.   Blood Culture    Component Value Date/Time   SDES URINE, CATHETERIZED 10/14/2019 0557   SPECREQUEST  10/14/2019 0557    NONE Performed at [facility], [place] [address] [place], [place] [address]    CULT MULTIPLE SPECIES PRESENT, SUGGEST RECOLLECTION (A) 10/14/2019 0557   REPTSTATUS 10/15/2019 FINAL 10/14/2019 0557    CBG: Recent Labs  Lab 10/15/19 1639 10/15/19 2006 10/15/19 2328 10/16/19 0328 10/16/19 0749  GLUCAP 99 93 100* 102* 91    Studies/Results: Ct Head Wo Contrast  Result Date: 10/15/2019 CLINICAL DATA:  Intracranial hemorrhage follow up EXAM: CT HEAD WITHOUT CONTRAST TECHNIQUE: Contiguous axial images were obtained from the base of the skull through the vertex without intravenous contrast. COMPARISON:  10/13/2019 head CT FINDINGS: Brain: Unchanged size of large intraparenchymal hematoma centered in the right basal ganglia. Intraventricular extension of hemorrhage is unchanged. Right frontal approach extraventricular drainage catheter is in unchanged position. The lateral ventricles have decreased in size. There is 6 mm of leftward midline shift, unchanged. No new site of hemorrhage. Vascular: No abnormal hyperdensity of the major intracranial arteries or dural venous sinuses. No intracranial atherosclerosis. Skull: The visualized skull base, calvarium and extracranial soft tissues are normal. Sinuses/Orbits: No fluid levels or advanced mucosal thickening of the visualized paranasal sinuses. No mastoid or middle ear effusion. The orbits are normal. IMPRESSION: 1. Unchanged size of large intraparenchymal hematoma centered in the  right basal ganglia with intraventricular extension. 2. Decreased size of the lateral ventricles with unchanged 6 mm of leftward midline shift. 3. No new site of hemorrhage. Electronically Signed   By: Ulyses Jarred M.D.   On: 10/15/2019 03:21   Medications: . sodium chloride Stopped (10/16/19 0952)  . cefTRIAXone (ROCEPHIN)  IV 200 mL/hr at 10/16/19 1000  . clevidipine 2 mg/hr (10/16/19 1056)  . propofol (DIPRIVAN) infusion Stopped (10/15/19 1038)  . sodium chloride (hypertonic) 240 mL/hr at 10/16/19 1000   . sodium chloride   Intravenous Once  . amLODipine  10 mg Per Tube QHS  . carvedilol  25 mg Per Tube BID WC  . chlorhexidine gluconate (MEDLINE KIT)  15 mL Mouth Rinse BID  . Chlorhexidine Gluconate Cloth  6 each Topical Q0600  . Chlorhexidine Gluconate Cloth  6 each Topical Q0600  . [START ON 10/23/2019] darbepoetin (ARANESP) injection - DIALYSIS  200 mcg Intravenous Q Mon-HD  . feeding supplement (PRO-STAT SUGAR FREE 64)  30 mL Per Tube 5 X Daily  . feeding supplement (VITAL HIGH PROTEIN)  1,000 mL Per Tube Q24H  . fentaNYL (SUBLIMAZE) injection  50 mcg Intravenous Once  . hydrALAZINE  100 mg Per Tube TID WC  . mouth rinse  15 mL Mouth Rinse 10 times per day  . minoxidil  10 mg Per Tube BID  . pantoprazole sodium  40 mg Per Tube BID  . senna-docusate  1 tablet Per Tube BID  . sodium chloride flush  10-40 mL Intracatheter Q12H  . spironolactone  25 mg Per Tube Daily    Dialysis Orders: [facility] MWF 4h 7mn 73.5kg 2.2 bath RU AVG (placed 08/15/19) / R TDCHep none - darbe 200 /wk - hect 3 - venofer 100 x 10 , completed  Assessment/Plan: 1. Acute intracranial bleed - w/ midline shift and IVH. SP EVD. Has been 3% saline day #4 of total approx 7d per neurology, goal Na+ 150-155. Held this AM for hypernatremia. Per primary/neuro.  2. ESRD - usual HD MWF. AVG  placed 08/15/19, have been using x 2 wks at OP unit per pt's wife, will continue to use here. Consult [facility] for TDC removal  once stable. Recent start in Sept 2020.  Next HD Monday. Have been using higher Na+ dialysate levels to help ^ Na+ level. Goal Na+ 150-155. Used 148 flat on Sat, will use 145 today. K+ 5.0.  3. Anemia ckd - Hb down today 6.4 > 7.0 > 6.9. Given another unit PRBC this AM. FOBT +. GI has been consulted. Resume aranesp 200 mcg q Monday. Transfuse prn.  4. HTN/volume - BP elevated this AM, minoxidil increased to 93m BID. Continue cleviprex prn and his 4 home BP meds per NG, goal SBP < 160. Weights up this AM, UF goal 2.5-3L with HD today.   5. H/o CVA - July 2020   SAnice Paganini PHershal Coria11/01/2019, 11:36 AM  [facility] Pager: [phone] I have seen and examined this patient and agree with plan and assessment in the above note with renal recommendations/intervention highlighted.  Adjusted sodium level with HD.  More interactive today.  BP's stable. JBroadus JohnA Laisha Rau,MD 10/16/2019 2:12 PM

## 2019-10-16 NOTE — Progress Notes (Signed)
Sodium @ 160, stopped 3% and paged neuro. Mentioned to keep the 3% off and will re-evaluate at next sodium check.   Leticia Clas, RN BSN, ME

## 2019-10-16 NOTE — Progress Notes (Signed)
OT Cancellation Note  Patient Details Name: Adonias Abud MRN: [id number] DOB: July 12, 1986   Cancelled Treatment:    Reason Eval/Treat Not Completed: Patient not medically ready Spoke with Rn TK and will sign off until more appropriate.  Billey Chang, OTR/L  Acute Rehabilitation Services Pager: [phone] Office: [phone] .  10/16/2019, 1:53 PM

## 2019-10-16 NOTE — Progress Notes (Signed)
Neurosurgery Service Progress Note  Subjective: Exam improved over the weekend, EVD still patent  Objective: Vitals:   10/16/19 0530 10/16/19 0600 10/16/19 0630 10/16/19 0700  BP: (!) 157/95 (!) 148/83 140/78 (!) 155/93  Pulse: 98 86 84 91  Resp: (!) 24 (!) 21 (!) 21 (!) 22  Temp: 98.2 F (36.8 C) 98.4 F (36.9 C) 98.2 F (36.8 C) 98.2 F (36.8 C)  TempSrc:      SpO2: 99% 98% 99% 98%  Weight:       Temp (24hrs), Avg:99 F (37.2 C), Min:97.5 F (36.4 C), Max:100.4 F (38 C)  CBC Latest Ref Rng & Units 10/16/2019 10/15/2019 10/15/2019  WBC 4.0 - 10.5 K/uL 10.9(H) 10.8(H) 10.3  Hemoglobin 13.0 - 17.0 g/dL 6.9(LL) 7.0(L) 6.4(LL)  Hematocrit 39.0 - 52.0 % 23.5(L) 22.5(L) 21.3(L)  Platelets 150 - 400 K/uL 330 236 235   BMP Latest Ref Rng & Units 10/16/2019 10/15/2019 10/15/2019  Glucose 70 - 99 mg/dL 111(H) - 107(H)  BUN 6 - 20 mg/dL 85(H) - 57(H)  Creatinine 0.61 - 1.24 mg/dL 10.36(H) - 8.58(H)  BUN/Creat Ratio 9 - 20 - - -  Sodium 135 - 145 mmol/L 160(H) 160(H) 156(H)  Potassium 3.5 - 5.1 mmol/L 5.0 - 4.8  Chloride 98 - 111 mmol/L 126(H) - 121(H)  CO2 22 - 32 mmol/L 21(L) - 22  Calcium 8.9 - 10.3 mg/dL 9.1 - 8.6(L)    Intake/Output Summary (Last 24 hours) at 10/16/2019 0748 Last data filed at 10/16/2019 0736 Gross per 24 hour  Intake 2066.59 ml  Output 243 ml  Net 1823.59 ml    Current Facility-Administered Medications:  .  0.9 %  sodium chloride infusion (Manually program via Guardrails IV Fluids), , Intravenous, Once, Deterding, Guadelupe Sabin, MD .  acetaminophen (TYLENOL) 160 MG/5ML solution 650 mg, 650 mg, Per Tube, Q4H PRN, Rosalin Hawking, MD, 650 mg at 10/16/19 0218 .  amLODipine (NORVASC) tablet 10 mg, 10 mg, Per Tube, QHS, Biby, Sharon L, NP, 10 mg at 10/15/19 2148 .  carvedilol (COREG) tablet 25 mg, 25 mg, Per Tube, BID WC, Aroor, Karena Addison R, MD, 25 mg at 10/15/19 1721 .  cefTRIAXone (ROCEPHIN) 1 g in sodium chloride 0.9 % 100 mL IVPB, 1 g, Intravenous, Q24H, Rosalin Hawking, MD, Stopped at 10/15/19 1146 .  chlorhexidine gluconate (MEDLINE KIT) (PERIDEX) 0.12 % solution 15 mL, 15 mL, Mouth Rinse, BID, Aroor, Lanice Schwab, MD, 15 mL at 10/15/19 1924 .  Chlorhexidine Gluconate Cloth 2 % PADS 6 each, 6 each, Topical, Q0600, Roney Jaffe, MD, 6 each at 10/16/19 0517 .  Chlorhexidine Gluconate Cloth 2 % PADS 6 each, 6 each, Topical, Q0600, Roney Jaffe, MD, 6 each at 10/16/19 0517 .  [START ON 10/23/2019] Darbepoetin Alfa (ARANESP) injection 200 mcg, 200 mcg, Intravenous, Q Mon-HD, Roney Jaffe, MD .  docusate (COLACE) 50 MG/5ML liquid 100 mg, 100 mg, Per Tube, BID PRN, Merlene Laughter F, NP .  feeding supplement (PRO-STAT SUGAR FREE 64) liquid 30 mL, 30 mL, Per Tube, 5 X Daily, Garvin Fila, MD, 30 mL at 10/16/19 0517 .  feeding supplement (VITAL HIGH PROTEIN) liquid 1,000 mL, 1,000 mL, Per Tube, Q24H, Garvin Fila, MD, 1,000 mL at 10/15/19 2148 .  fentaNYL (SUBLIMAZE) injection 50 mcg, 50 mcg, Intravenous, Once, Deterding, Guadelupe Sabin, MD .  heparin injection 1,000-6,000 Units, 1,000-6,000 Units, Intravenous, PRN, Aroor, Lanice Schwab, MD, 1,000 Units at 10/12/19 2215 .  hydrALAZINE (APRESOLINE) tablet 100 mg, 100 mg, Per Tube, TID  WC, Biby, Sharon L, NP, 100 mg at 10/15/19 1721 .  labetalol (NORMODYNE) injection 10-20 mg, 10-20 mg, Intravenous, Q10 min PRN, Rosalin Hawking, MD, 20 mg at 10/16/19 0517 .  MEDLINE mouth rinse, 15 mL, Mouth Rinse, 10 times per day, Aroor, Lanice Schwab, MD, 15 mL at 10/16/19 0517 .  minoxidil (LONITEN) tablet 5 mg, 5 mg, Per Tube, BID, Biby, Sharon L, NP, 5 mg at 10/15/19 2148 .  pantoprazole sodium (PROTONIX) 40 mg/20 mL oral suspension 40 mg, 40 mg, Per Tube, Daily, Rosalin Hawking, MD, 40 mg at 10/15/19 1012 .  propofol (DIPRIVAN) 1000 MG/100ML infusion, 5-80 mcg/kg/min, Intravenous, Continuous, [name], MD, Stopped at 10/15/19 1038 .  senna-docusate (Senokot-S) tablet 1 tablet, 1 tablet, Per Tube, BID, Aroor, Lanice Schwab, MD, 1  tablet at 10/14/19 2151 .  sodium chloride (hypertonic) 3 % solution, , Intravenous, Continuous, Rosalin Hawking, MD, Stopped at 10/15/19 2351 .  sodium chloride flush (NS) 0.9 % injection 10-40 mL, 10-40 mL, Intracatheter, Q12H, Aroor, Lanice Schwab, MD, 10 mL at 10/15/19 2149 .  sodium chloride flush (NS) 0.9 % injection 10-40 mL, 10-40 mL, Intracatheter, PRN, Aroor, Karena Addison R, MD .  spironolactone (ALDACTONE) tablet 25 mg, 25 mg, Per Tube, Daily, Biby, Sharon L, NP, 25 mg at 10/15/19 1012   Physical Exam: Intubated, gaze dysconjugate, eyes open to voice, FC on R reliably  Assessment & Plan: 33 y.o. man w/ ESRD on ASA/plavix s/p R ICH/IVH s/p R EVD. Rpt CTH w/ malpositioned, but patent EVD.  -exam improved, will clamp EVD today and follow exam, hopefully we can wean the drain  Judith Part  10/16/19 7:48 AM

## 2019-10-16 NOTE — Progress Notes (Signed)
STROKE TEAM PROGRESS NOTE   INTERVAL HISTORY RN at bedside. Pt lying in bed, still intubated but now off sedation and on weaning trial. Still on cleviprex for BP. EVD clamped this am. His Hb still low at 6.9 and received another unit of PRBC. Na 160 early today and 3% was on hold.   OBJECTIVE Vitals:   10/16/19 0700 10/16/19 0752 10/16/19 0800 10/16/19 0900  BP: (!) 155/93  (!) 168/93 (!) 165/99  Pulse: 91 99 93 97  Resp: (!) 22 (!) 24 (!) 21 (!) 24  Temp: 98.2 F (36.8 C)  98.4 F (36.9 C) (!) 97.1 F (36.2 C)  TempSrc:   Esophageal Esophageal  SpO2: 98% 100% 99%   Weight:        CBC:  Recent Labs  Lab 09/20/2019 0540  10/15/19 1200 10/16/19 0540  WBC 7.1   < > 10.8* 10.9*  NEUTROABS 3.9  --   --   --   HGB 9.6*   < > 7.0* 6.9*  HCT 30.6*   < > 22.5* 23.5*  MCV 93.3   < > 96.6 98.7  PLT 387   < > 236 330   < > = values in this interval not displayed.    Basic Metabolic Panel:  Recent Labs  Lab 10/13/19 0515  10/13/19 1633  10/15/19 1203 10/15/19 2147 10/16/19 0540  NA 145   < >  --    < > 156* 160* 160*  K 4.9  --   --    < > 4.8  --  5.0  CL 107  --   --    < > 121*  --  126*  CO2 25  --   --    < > 22  --  21*  GLUCOSE 119*  --   --    < > 107*  --  111*  BUN 26*  --   --    < > 57*  --  85*  CREATININE 8.97*  --   --    < > 8.58*  --  10.36*  CALCIUM 8.9  --   --    < > 8.6*  --  9.1  MG 2.2  --  2.2  --   --   --   --   PHOS 4.4  --  4.5  --   --   --   --    < > = values in this interval not displayed.    Lipid Panel:     Component Value Date/Time   CHOL 253 (H) 06/23/2019 0301   TRIG 173 (H) 10/15/2019 0539   HDL 67 06/23/2019 0301   CHOLHDL 3.8 06/23/2019 0301   VLDL 28 06/23/2019 0301   LDLCALC 158 (H) 06/23/2019 0301   HgbA1c:  Lab Results  Component Value Date   HGBA1C 4.9 06/23/2019   Urine Drug Screen:     Component Value Date/Time   LABOPIA NONE DETECTED 09/15/2019 0648   COCAINSCRNUR NONE DETECTED 10/10/2019 0648   LABBENZ NONE  DETECTED 09/22/2019 0648   AMPHETMU NONE DETECTED 09/15/2019 0648   THCU NONE DETECTED 09/30/2019 0648   LABBARB NONE DETECTED 09/16/2019 0648    Alcohol Level No results found for: ETH  IMAGING  CT Head Wo Contrast 10/15/2019 IMPRESSION: 1. Unchanged size of large intraparenchymal hematoma centered in the right basal ganglia with intraventricular extension. 2. Decreased size of the lateral ventricles with unchanged 6 mm of leftward midline shift. 3. No  new site of hemorrhage.  Ct Head Wo Contrast 10/13/2019 IMPRESSION:  1. Unchanged right basal ganglia and intraventricular clot.  2. Improved ventriculomegaly.  3. Stable 1 cm leftward midline shift.   Dg Chest Port 1 View 10/13/2019 IMPRESSION:  1. Unremarkable hardware positioning.  2. Improved aeration but persistent dense opacity behind the heart.   Transthoracic Echocardiogram  1. The left ventricle has hyperdynamic systolic function, with an ejection fraction of >65%. The cavity size was decreased. There is severe concentric left ventricular hypertrophy. Left ventricular diastolic parameters were normal. No evidence of left  ventricular regional wall motion abnormalities.  2. The right ventricle has normal systolic function. The cavity was normal. There is no increase in right ventricular wall thickness. Right ventricular systolic pressure could not be assessed.  3. Small pericardial effusion.  4. No evidence of mitral valve stenosis.  5. The aortic valve is tricuspid. No stenosis of the aortic valve.  EEG 10/15/2019 This is an abnormal electroencephalogram secondary to focal right hemispheric slowing superimposed on a diffusely slow background.  Occasional right frontal sharp waves are noted as well.   These findings are consistent with the patient's history of right ICH with right EVD placement.  No subclinical seizure activity is noted.     PHYSICAL EXAM  Temp:  [97.1 F (36.2 C)-100.4 F (38 C)] 97.1 F (36.2 C)  (11/02 0900) Pulse Rate:  [84-109] 97 (11/02 0900) Resp:  [18-30] 24 (11/02 0900) BP: (129-180)/(62-99) 165/99 (11/02 0900) SpO2:  [98 %-100 %] 99 % (11/02 0800) FiO2 (%):  [40 %] 40 % (11/02 0752) Weight:  [76.3 kg] 76.3 kg (11/02 0500)  General - Well nourished, well developed, intubated off sedation.  Ophthalmologic - fundi not visualized due to noncooperation.  Cardiovascular - Regular rate and rhythm.  Neuro - intubated off sedation, open eyes on voice, right gaze preference, barely cross midline. Follow commands on the right hand and foot, but very slow to react. Blinking to visual threat on the right but inconsistent on the left, not tracking, PERRL. Corneal reflex absent on the left but present on the right, gag and cough present. Breathing over the vent.  Facial symmetry not able to test due to ET tube.  Tongue protrusion not cooperative. Spontaneous movement of right hand and foot, but only mild withdraw with pain stimulation for proximal right UE and LE, slight withdraw of LUE and LLE on pain. DTR 1+ and no babinski. Sensation, coordination and gait not tested.   ASSESSMENT/PLAN Mr. Finnbar Varda is a 33 y.o. male with history of hypertension, end-stage renal disease on ASA+Plavix for recent stroke presents to the emergency department obtunded with left-sided plegia and right gaze deviation. CT head showed 60 cc hematoma in the right basal ganglia with intraventricular extension, hydrocephalus, and 1 cm of midline shift. Patient was emergency intubated mainly for airway protection. EVD was emergently placed at bedside by [place] for hydrocephalus. Patient also was given desmopressin given the fact that he is using Plavix with small studies supporting its use in the setting of antiplatelets diminishing incidence of hemorrhage expansion.   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 Mass effect. 48mm L midline shift. IVH increased in R lateral ventricle atrium. R EVD  Repeat CT head - 10/13/19 - unchanged R  basal ganglia ICH and IVH. Improved ventriculomegaly. Stable 1cm midline shift.  [facility] 10/15/19 - Unchanged size of large intraparenchymal hematoma centered in the right basal ganglia with intraventricular extension.Decreased size of the lateral ventricles with unchanged 6 mm of leftward midline shift.  2D Echo - EF >65%. No source of embolus   EEG no sz.  Sars Corona Virus 2  neg  LDL - 158 in July  HgbA1c 4.9 in July    UDS neg  VTE prophylaxis - SCDs only d/t bleed  ASA + Plavix prior to admission, now not indicated  Therapy recommendations:  Pending (need to reorder when ready)  Disposition:  Pending  Wife very hopeful  Cerebral Edema Obstructive hydrocephalus  EVD placed in ED (Ostergard)  Na 142-145-147->152->149->154->156->160->156  Goal Na 150-155  3% on hold now  NSG clamped EVD today  Monitor   Acute Hypoxemic Respiratory Failure  Intubated  Off sedation today  Unable to extubate d/t mental status  CCM on board  On weaning trials  Fever   Temp 100.7->100->afebrile->100.4  CXR - haze right LL, pneumonia vs. Atelectasis  UA showed WBC > 50  Urine Cx - difficult collection for pt ESRD   On rocephine 10/31>>  Tylenol PRN  Hypertensive Emergency  BP as high as 194/105 on arrival  Home BP meds: norvasc 10, coreg 25, hydralazine 100 tid, sponolactone 25, minoxidil 5 bid  Current BP meds: Cleviprex PRN + resumed home meds . Goal SBP <160  . BP still at high end - increase minoxidil to 10mg  bid . Nephrology on board . Long-term BP goal normotensive  Hx stroke/TIA  06/2019 admitted for confusion and hypertensive encephalopathy.  MRI showed numerous bilateral anterior and posterior circulation infarcts throughout.  MRA head and  neck unremarkable.  EF > 65%.  LDL 158, A1c 4.9.  UDS positive for cocaine and THC.   Discharged on DAPT  ESRD on HD   MWF schedule  Renal following  For HD today  Anemia d/t CKD, hx duodenitis  Hb 7.1->8.0->7.7->7.4->6.4->6.9  Received PRBC transfusion x 2  EGD in Sept w/ duodenitis w/ erosion  Stool occult blood +  PPI bid  For GI consult  CBC monitoring  Hyperlipidemia  Home Lipid lowering medication: none   LDL 158 in July  Hold statin given hemorrhage   Consider statin on discharge  Dysphagia . Secondary to stroke . NPO . Has OG . On TF @20     Tobacco abuse  Current smoker  Smoking cessation counseling will be provided   Other Stroke Risk Factors  ETOH abuse; wife reports he quit in July  Hx polysubstance abuse, hx cocaine abuse. UDS neg this admission  Other [facility] day # 5  This patient is critically ill and at significant risk of neurological worsening, death and care requires constant monitoring of vital signs, hemodynamics,respiratory and cardiac monitoring, extensive review of multiple databases, frequent neurological assessment, discussion with family, other specialists and medical decision making of high complexity. I spent 40 minutes of neurocritical care time  in the care of  this patient.  Rosalin Hawking, MD PhD Stroke Neurology 10/16/2019 9:41 AM    To contact Stroke Continuity provider, please refer to [email or web address] After hours, contact General Neurology

## 2019-10-16 NOTE — Progress Notes (Signed)
Upon initial assessment with day shift RN, we noticed the patient was still not following any commands, had a stronger gaze and more of an upward gaze rather than the left gaze. He also wasn't responding to pain as much.   Called neuro and completed a stat head CT and sodium level. CT showed no change and sodium was at 144. Restarting 3% and will closely monitor the patient.   Leticia Clas, RN BSN, ME

## 2019-10-16 NOTE — Progress Notes (Signed)
Patient transported to CT and back without any complications.  

## 2019-10-16 NOTE — Consult Note (Signed)
Referring Provider:   PCCM        Primary Care Physician:  Lin Landsman, MD Primary Gastroenterologist:   Althia Forts          Reason for Consultation:   Anemia, heme positive stool                ASSESSMENT /  PLAN    1. 33 yo male with HTN, CVA on plavix / asa, ESRD ( recently started dialysis), duodenitis, chronic anemia. Admitted with intracranial hemorrhage.   2. Anemia / Heme positive , dark stool yesterday. Known duodenitis with erosions based on EGD in mid September 2020. No other pathology found at time of EGD.  -At this point we will likely maximize medical therapy and hold off on repeating EGD. Given the large dark stool yesterday will change PPI to a gtt.    HPI:     Kanon Novosel is a 33 y.o. male with pmh significant for cocaine use, HTN, ESRD on HD (started in August), and CVA on plavix and asa. Several hospital admissions over last few months for CVA, AKI, uncontrolled HTN, worsening anemia.  Hgb in late July was ~ 10, it declined to 7.5 in August, he received IV iron.  In September he was admitted with symptomatic anemia, hgb of 6.4.  He received a unit of blood and underwent EGD with biopsies by [facility] with findings of reactive gastropathy and peptic duodenitis.  Hgb at discharge was 7.2.     Patient unable to provide history so details provided by wife Daphane Shepherd who is at bedside.  At home patient was taking a PPI as prescribed at time of discharge in September. He has had frequent episodes of nausea and vomiting of non-bloody emesisg over the last month or so since starting dialysis. Patient has less frequent bowel movement since starting dialysis but still has a bowel movement every day.  She has no knowledge of any black stools at home. He hasn't complained of any abdominal pain.   [place] was admitted 10/28 with intracranial hemorrhage.   In ED his hgb was 10.9, it declined to 6.4 yesterday, he got a unit of blood yesterday, hgb rose less than a gram today to 6.9.  He got an additional unit of blood this am. Per wife and nursing staff, patient had a large, malodorous dark stool yesterday - heme positive.   Endoscopic History:  EGD 08/27/19  -dark stools and anemia Antral erythema duodenitis  Diagnosis 1. Duodenum, Biopsy - PEPTIC DUODENITIS WITH EROSION 2. Stomach, biopsy, antral - GASTRIC ANTRAL MUCOSA WITH MILD NONSPECIFIC REACTIVE GASTROPATHY - WARTHIN STARRY STAIN IS NEGATIVE FOR HELICOBACTER PYLORI Jaquita Folds MD Pathologist, Electronic Signature (Case signed 08/29/2019) Sp  Past Medical History:  Diagnosis Date   Anemia    Drug abuse, cocaine type ([place]) 06/23/2019   ESRD (end stage renal disease) ([place]) 08/08/2019   Hyperlipidemia    Hypertension    Stroke ([place]) 06/2019    Past Surgical History:  Procedure Laterality Date   AV FISTULA PLACEMENT Right 08/15/2019   Procedure: INSERTION OF ARTERIOVENOUS (AV) GORE-TEX GRAFT ARM. Using 4-35m STRETCH Goretex.;  Surgeon: DAngelia Mould MD;  [facility];  Service: Vascular;  Laterality: Right;   BIOPSY  08/27/2019   Procedure: BIOPSY;  Surgeon: KRonnette Juniper MD;  [facility];  Service: Gastroenterology;;   ESOPHAGOGASTRODUODENOSCOPY (EGD) WITH PROPOFOL N/A 08/27/2019   Procedure: ESOPHAGOGASTRODUODENOSCOPY (EGD) WITH PROPOFOL;  Surgeon: KRonnette Juniper MD;  [facility]  Service:  Gastroenterology;  Laterality: N/A;   INSERTION OF DIALYSIS CATHETER Right 08/08/2019   Procedure: INSERTION OF right internal jugular DIALYSIS CATHETER;  Surgeon: Angelia Mould, MD;  Location: [facility] OR;  Service: Vascular;  Laterality: Right;   RADIOLOGY WITH ANESTHESIA N/A 06/23/2019   Procedure: MRI OF VESSELS;  Surgeon: Radiologist, Medication, MD;  Location: [place];  Service: Radiology;  Laterality: N/A;    Prior to Admission medications   Medication Sig Start Date End Date Taking? Authorizing Provider  amLODipine (NORVASC) 10 MG tablet Take 1 tablet (10 mg  total) by mouth at bedtime. 07/08/19  Yes Debbe Odea, MD  carvedilol (COREG) 25 MG tablet Take 1 tablet (25 mg total) by mouth 2 (two) times daily with a meal. 06/29/19  Yes Adhikari, Amrit, MD  chlorproMAZINE (THORAZINE) 10 MG tablet Take 1 tablet (10 mg total) by mouth 3 (three) times daily as needed for hiccoughs. 08/10/19  Yes Shelly Coss, MD  clopidogrel (PLAVIX) 75 MG tablet Take 75 mg by mouth daily. 08/11/19  Yes [provider]  folic acid-vitamin b complex-vitamin c-selenium-zinc (DIALYVITE) 3 MG TABS tablet Take 1 tablet by mouth daily.   Yes [provider]  hydrALAZINE (APRESOLINE) 100 MG tablet Take 1 tablet (100 mg total) by mouth 3 (three) times daily with meals. 09/07/19  Yes Cleaver, Jossie Ng, NP  minoxidil (LONITEN) 2.5 MG tablet Take 2 tablets (5 mg total) by mouth 2 (two) times daily. 08/20/19  Yes Debbe Odea, MD  pantoprazole (PROTONIX) 40 MG tablet Take 1 tablet (40 mg total) by mouth daily. 08/27/19 10/02/2019 Yes Arrien, Jimmy Picket, MD  promethazine (PHENERGAN) 12.5 MG tablet Take 12.5 mg by mouth 3 (three) times daily as needed for nausea or vomiting.  09/13/19  Yes [provider]  promethazine (PHENERGAN) 6.25 MG/5ML syrup Take 6.25 mg by mouth 2 (two) times daily. 10/09/19  Yes [provider]  spironolactone (ALDACTONE) 25 MG tablet Take 25 mg by mouth daily.   Yes [provider]  sucroferric oxyhydroxide (VELPHORO) 500 MG chewable tablet Chew 500 mg by mouth 3 (three) times daily with meals. 08/21/19  Yes [provider]  atorvastatin (LIPITOR) 40 MG tablet Take 1 tablet (40 mg total) by mouth daily at 6 PM. Patient not taking: Reported on 09/15/2019 06/29/19   Shelly Coss, MD    Current Facility-Administered Medications  Medication Dose Route Frequency Provider Last Rate Last Dose   0.9 %  sodium chloride infusion (Manually program via Guardrails IV Fluids)   Intravenous Once Deterding, Guadelupe Sabin, MD         0.9 %  sodium chloride infusion   Intravenous PRN Rosalin Hawking, MD   Stopped at 10/16/19 [phone]   acetaminophen (TYLENOL) 160 MG/5ML solution 650 mg  650 mg Per Tube Q4H PRN Rosalin Hawking, MD   650 mg at 10/16/19 0218   amLODipine (NORVASC) tablet 10 mg  10 mg Per Tube QHS Donzetta Starch, NP   10 mg at 10/15/19 2148   carvedilol (COREG) tablet 25 mg  25 mg Per Tube BID WC Aroor, Sushanth R, MD   25 mg at 10/16/19 0827   cefTRIAXone (ROCEPHIN) 1 g in sodium chloride 0.9 % 100 mL IVPB  1 g Intravenous Q24H Rosalin Hawking, MD 200 mL/hr at 10/16/19 1000     chlorhexidine gluconate (MEDLINE KIT) (PERIDEX) 0.12 % solution 15 mL  15 mL Mouth Rinse BID Aroor, Lanice Schwab, MD   15 mL at 10/16/19 0916   Chlorhexidine Gluconate  Cloth 2 % PADS 6 each  6 each Topical Q0600 Roney Jaffe, MD   6 each at 10/16/19 0517   Chlorhexidine Gluconate Cloth 2 % PADS 6 each  6 each Topical Q0600 Roney Jaffe, MD   6 each at 10/16/19 0517   clevidipine (CLEVIPREX) infusion 0.5 mg/mL  0-21 mg/hr Intravenous Continuous Rosalin Hawking, MD 4 mL/hr at 10/16/19 1056 2 mg/hr at 10/16/19 1056   [START ON 10/23/2019] Darbepoetin Alfa (ARANESP) injection 200 mcg  200 mcg Intravenous Q Mon-HD Roney Jaffe, MD       docusate (COLACE) 50 MG/5ML liquid 100 mg  100 mg Per Tube BID PRN Merlene Laughter F, NP       feeding supplement (PRO-STAT SUGAR FREE 64) liquid 30 mL  30 mL Per Tube TID Mannam, Praveen, MD       feeding supplement (VITAL 1.5 CAL) liquid 1,000 mL  1,000 mL Per Tube Continuous Mannam, Praveen, MD       fentaNYL (SUBLIMAZE) injection 50 mcg  50 mcg Intravenous Once Deterding, Guadelupe Sabin, MD       heparin injection 1,000-6,000 Units  1,000-6,000 Units Intravenous PRN Aroor, Lanice Schwab, MD   1,000 Units at 10/12/19 2215   hydrALAZINE (APRESOLINE) tablet 100 mg  100 mg Per Tube TID WC Burnetta Sabin L, NP   100 mg at 10/16/19 0827   labetalol (NORMODYNE) injection 10-20 mg  10-20 mg Intravenous Q10 min PRN Rosalin Hawking, MD   20 mg at 10/16/19 5732   MEDLINE mouth rinse  15 mL Mouth Rinse 10 times per day Aroor, Karena Addison R, MD   15 mL at 10/16/19 1101   minoxidil (LONITEN) tablet 10 mg  10 mg Per Tube BID Rosalin Hawking, MD       pantoprazole sodium (PROTONIX) 40 mg/20 mL oral suspension 40 mg  40 mg Per Tube BID Mannam, Praveen, MD   40 mg at 10/16/19 0924   propofol (DIPRIVAN) 1000 MG/100ML infusion  5-80 mcg/kg/min Intravenous Continuous [name], MD   Stopped at 10/15/19 1038   senna-docusate (Senokot-S) tablet 1 tablet  1 tablet Per Tube BID Aroor, Lanice Schwab, MD   1 tablet at 10/16/19 2025   sodium chloride (hypertonic) 3 % solution   Intravenous Continuous Rosalin Hawking, MD 240 mL/hr at 10/16/19 1000     sodium chloride flush (NS) 0.9 % injection 10-40 mL  10-40 mL Intracatheter Q12H Aroor, Sushanth R, MD   10 mL at 10/16/19 0925   sodium chloride flush (NS) 0.9 % injection 10-40 mL  10-40 mL Intracatheter PRN Aroor, Lanice Schwab, MD       spironolactone (ALDACTONE) tablet 25 mg  25 mg Per Tube Daily Donzetta Starch, NP   25 mg at 10/16/19 4270    Allergies as of 09/14/2019 - Review Complete 09/21/2019  Allergen Reaction Noted   Reglan [metoclopramide] Nausea And Vomiting 06/22/2019    Family History  Problem Relation Age of Onset   Diabetes Sister    Diabetes Maternal Uncle    Hypertension Father    Hypertension Paternal Grandfather    Prostate cancer Paternal Grandfather    Diabetes Sister    Renal Disease Neg Hx     Social History   Socioeconomic History   Marital status: Married    Spouse name: [name]   Number of children: 3   Years of education: Not on file   Highest education level: High school graduate  Occupational History   Occupation: Secretary/administrator - laid off  Social Designer, fashion/clothing strain: Not hard at all   Food insecurity    Worry: Never true    Inability: Never true   Transportation needs    Medical: No    Non-medical: No  Tobacco  Use   Smoking status: Former Smoker    Packs/day: 0.00    Years: 20.00    Pack years: 0.00    Quit date: 06/2019    Years since quitting: 0.3   Smokeless tobacco: Never Used  Substance and Sexual Activity   Alcohol use: Not Currently    Comment: 2-3 beers a day; stopped with 7/20 hospitalization   Drug use: Yes    Types: Marijuana, Cocaine, Benzodiazepines    Comment: denies use since 7/20 hospitalization   Sexual activity: Not Currently    Partners: Female  Lifestyle   Physical activity    Days per week: 0 days    Minutes per session: 0 min   Stress: Not at all  Relationships   Social connections    Talks on phone: More than three times a week    Gets together: Never    Attends religious service: 1 to 4 times per year    Active member of club or organization: No    Attends meetings of clubs or organizations: Never    Relationship status: Married   Intimate partner violence    Fear of current or ex partner: No    Emotionally abused: No    Physically abused: No    Forced sexual activity: No  Other Topics Concern   Not on file  Social History Narrative   Not on file    Review of Systems: UnobtainableI.  Physical Exam: Vital signs in last 24 hours: Temp:  [97.1 F (36.2 C)-100.4 F (38 C)] 99.5 F (37.5 C) (11/02 1100) Pulse Rate:  [84-109] 101 (11/02 1106) Resp:  [18-33] 29 (11/02 1106) BP: (129-176)/(62-107) 168/107 (11/02 1100) SpO2:  [96 %-100 %] 98 % (11/02 1106) FiO2 (%):  [40 %] 40 % (11/02 1106) Weight:  [76.3 kg] 76.3 kg (11/02 0500) Last BM Date: 10/15/19 General:   Well developed black male in NAD. Intubated Psych:  Cannot assess Nose:  No deformity, discharge,  or lesions. Neck:  Supple; no masses Lungs:  Clear throughout to auscultation.   No wheezes, crackles, or rhonchi.  Heart:  Regular rate and rhythm; no murmurs, no lower extremity edema Abdomen:  Soft, non-distended, nontender, BS active, no palp mass   Rectal:  Deferred  Msk:   Symmetrical without gross deformities. . Neurologic: Opens eyes to stimulus. . Skin:  Intact without significant lesions or rashes.Extensive tatoos   Intake/Output from previous day: 11/01 0701 - 11/02 0700 In: 2066.6 [I.V.:1071.6; Blood:315; NG/GT:480; IV Piggyback:200] Out: 239 [Drains:239] Intake/Output this shift: Total I/O In: 200.3 [I.V.:146.3; Blood:30; IV Piggyback:24] Out: 4 [Drains:4]  Lab Results: Recent Labs    10/15/19 0538 10/15/19 1200 10/16/19 0540  WBC 10.3 10.8* 10.9*  HGB 6.4* 7.0* 6.9*  HCT 21.3* 22.5* 23.5*  PLT 235 236 330   BMET Recent Labs    10/15/19 0538 10/15/19 1203 10/15/19 2147 10/16/19 0540 10/16/19 0940  NA 154* 156* 160* 160* 156*  K 4.5 4.8  --  5.0  --   CL 115* 121*  --  126*  --   CO2 23 22  --  21*  --   GLUCOSE 110* 107*  --  111*  --   BUN 49* 57*  --  85*  --  CREATININE 7.95* 8.58*  --  10.36*  --   CALCIUM 8.8* 8.6*  --  9.1  --    LFT No results for input(s): PROT, ALBUMIN, AST, ALT, ALKPHOS, BILITOT, BILIDIR, IBILI in the last 72 hours. PT/INR No results for input(s): LABPROT, INR in the last 72 hours. Hepatitis Panel No results for input(s): HEPBSAG, HCVAB, HEPAIGM, HEPBIGM in the last 72 hours.   . CBC Latest Ref Rng & Units 10/16/2019 10/15/2019 10/15/2019  WBC 4.0 - 10.5 K/uL 10.9(H) 10.8(H) 10.3  Hemoglobin 13.0 - 17.0 g/dL 6.9(LL) 7.0(L) 6.4(LL)  Hematocrit 39.0 - 52.0 % 23.5(L) 22.5(L) 21.3(L)  Platelets 150 - 400 K/uL 330 236 235    . CMP Latest Ref Rng & Units 10/16/2019 10/16/2019 10/15/2019  Glucose 70 - 99 mg/dL - 111(H) -  BUN 6 - 20 mg/dL - 85(H) -  Creatinine 0.61 - 1.24 mg/dL - 10.36(H) -  Sodium 135 - 145 mmol/L 156(H) 160(H) 160(H)  Potassium 3.5 - 5.1 mmol/L - 5.0 -  Chloride 98 - 111 mmol/L - 126(H) -  CO2 22 - 32 mmol/L - 21(L) -  Calcium 8.9 - 10.3 mg/dL - 9.1 -  Total Protein 6.5 - 8.1 g/dL - - -  Total Bilirubin 0.3 - 1.2 mg/dL - - -  Alkaline Phos 38 - 126 U/L - - -  AST 15 - 41  U/L - - -  ALT 0 - 44 U/L - - -   Studies/Results: Ct Head Wo Contrast  Result Date: 10/15/2019 CLINICAL DATA:  Intracranial hemorrhage follow up EXAM: CT HEAD WITHOUT CONTRAST TECHNIQUE: Contiguous axial images were obtained from the base of the skull through the vertex without intravenous contrast. COMPARISON:  10/13/2019 head CT FINDINGS: Brain: Unchanged size of large intraparenchymal hematoma centered in the right basal ganglia. Intraventricular extension of hemorrhage is unchanged. Right frontal approach extraventricular drainage catheter is in unchanged position. The lateral ventricles have decreased in size. There is 6 mm of leftward midline shift, unchanged. No new site of hemorrhage. Vascular: No abnormal hyperdensity of the major intracranial arteries or dural venous sinuses. No intracranial atherosclerosis. Skull: The visualized skull base, calvarium and extracranial soft tissues are normal. Sinuses/Orbits: No fluid levels or advanced mucosal thickening of the visualized paranasal sinuses. No mastoid or middle ear effusion. The orbits are normal. IMPRESSION: 1. Unchanged size of large intraparenchymal hematoma centered in the right basal ganglia with intraventricular extension. 2. Decreased size of the lateral ventricles with unchanged 6 mm of leftward midline shift. 3. No new site of hemorrhage. Electronically Signed   By: Ulyses Jarred M.D.   On: 10/15/2019 03:21    Active Problems:   ICH (intracerebral hemorrhage) (HCC)   Acute respiratory failure with hypoxia (HCC)   Malnutrition of moderate degree   Cytotoxic brain edema ([place])   Obstructive hydrocephalus ([place])    Tye Savoy, NP-C @  10/16/2019, 12:48 PM

## 2019-10-17 ENCOUNTER — Inpatient Hospital Stay (HOSPITAL_COMMUNITY): Payer: Medicaid Other

## 2019-10-17 ENCOUNTER — Encounter (HOSPITAL_COMMUNITY): Payer: Self-pay | Admitting: Emergency Medicine

## 2019-10-17 ENCOUNTER — Encounter (HOSPITAL_COMMUNITY): Admission: EM | Disposition: E | Payer: Self-pay | Source: Home / Self Care | Attending: Neurology

## 2019-10-17 DIAGNOSIS — G936 Cerebral edema: Secondary | ICD-10-CM

## 2019-10-17 DIAGNOSIS — K922 Gastrointestinal hemorrhage, unspecified: Secondary | ICD-10-CM

## 2019-10-17 DIAGNOSIS — K921 Melena: Secondary | ICD-10-CM

## 2019-10-17 LAB — POCT I-STAT 7, (LYTES, BLD GAS, ICA,H+H)
Acid-base deficit: 1 mmol/L (ref 0.0–2.0)
Bicarbonate: 23.2 mmol/L (ref 20.0–28.0)
Calcium, Ion: 1.17 mmol/L (ref 1.15–1.40)
HCT: 27 % — ABNORMAL LOW (ref 39.0–52.0)
Hemoglobin: 9.2 g/dL — ABNORMAL LOW (ref 13.0–17.0)
O2 Saturation: 98 %
Patient temperature: 100.9
Potassium: 4.8 mmol/L (ref 3.5–5.1)
Sodium: 149 mmol/L — ABNORMAL HIGH (ref 135–145)
TCO2: 24 mmol/L (ref 22–32)
pCO2 arterial: 37.3 mmHg (ref 32.0–48.0)
pH, Arterial: 7.408 (ref 7.350–7.450)
pO2, Arterial: 102 mmHg (ref 83.0–108.0)

## 2019-10-17 LAB — CBC
HCT: 23.2 % — ABNORMAL LOW (ref 39.0–52.0)
HCT: 26.4 % — ABNORMAL LOW (ref 39.0–52.0)
HCT: 24.7 % — ABNORMAL LOW (ref 39.0–52.0)
Hemoglobin: 7.2 g/dL — ABNORMAL LOW (ref 13.0–17.0)
Hemoglobin: 8.4 g/dL — ABNORMAL LOW (ref 13.0–17.0)
Hemoglobin: 8 g/dL — ABNORMAL LOW (ref 13.0–17.0)
MCH: 29.1 pg (ref 26.0–34.0)
MCH: 29.5 pg (ref 26.0–34.0)
MCH: 30.1 pg (ref 26.0–34.0)
MCHC: 31 g/dL (ref 30.0–36.0)
MCHC: 31.8 g/dL (ref 30.0–36.0)
MCHC: 32.4 g/dL (ref 30.0–36.0)
MCV: 93.9 fL (ref 80.0–100.0)
MCV: 92.6 fL (ref 80.0–100.0)
MCV: 92.9 fL (ref 80.0–100.0)
Platelets: 285 10*3/uL (ref 150–400)
Platelets: 353 10*3/uL (ref 150–400)
Platelets: 303 10*3/uL (ref 150–400)
RBC: 2.47 MIL/uL — ABNORMAL LOW (ref 4.22–5.81)
RBC: 2.85 MIL/uL — ABNORMAL LOW (ref 4.22–5.81)
RBC: 2.66 MIL/uL — ABNORMAL LOW (ref 4.22–5.81)
RDW: 16.6 % — ABNORMAL HIGH (ref 11.5–15.5)
RDW: 16.4 % — ABNORMAL HIGH (ref 11.5–15.5)
RDW: 16.4 % — ABNORMAL HIGH (ref 11.5–15.5)
WBC: 8.7 10*3/uL (ref 4.0–10.5)
WBC: 13.4 10*3/uL — ABNORMAL HIGH (ref 4.0–10.5)
WBC: 10.3 10*3/uL (ref 4.0–10.5)
nRBC: 0.6 % — ABNORMAL HIGH (ref 0.0–0.2)
nRBC: 0.5 % — ABNORMAL HIGH (ref 0.0–0.2)
nRBC: 0.8 % — ABNORMAL HIGH (ref 0.0–0.2)

## 2019-10-17 LAB — TYPE AND SCREEN
ABO/RH(D): O POS
Antibody Screen: NEGATIVE
Unit division: 0
Unit division: 0

## 2019-10-17 LAB — GLUCOSE, CAPILLARY
Glucose-Capillary: 122 mg/dL — ABNORMAL HIGH (ref 70–99)
Glucose-Capillary: 115 mg/dL — ABNORMAL HIGH (ref 70–99)
Glucose-Capillary: 112 mg/dL — ABNORMAL HIGH (ref 70–99)
Glucose-Capillary: 113 mg/dL — ABNORMAL HIGH (ref 70–99)
Glucose-Capillary: 92 mg/dL (ref 70–99)
Glucose-Capillary: 126 mg/dL — ABNORMAL HIGH (ref 70–99)

## 2019-10-17 LAB — BPAM RBC
Blood Product Expiration Date: 202011262359
Blood Product Expiration Date: 202012022359
ISSUE DATE / TIME: 202011010852
ISSUE DATE / TIME: 202011020859
Unit Type and Rh: 5100
Unit Type and Rh: 5100

## 2019-10-17 LAB — BASIC METABOLIC PANEL
Anion gap: 18 — ABNORMAL HIGH (ref 5–15)
BUN: 66 mg/dL — ABNORMAL HIGH (ref 6–20)
CO2: 24 mmol/L (ref 22–32)
Calcium: 8.7 mg/dL — ABNORMAL LOW (ref 8.9–10.3)
Chloride: 105 mmol/L (ref 98–111)
Creatinine, Ser: 7.34 mg/dL — ABNORMAL HIGH (ref 0.61–1.24)
GFR calc Af Amer: 10 mL/min — ABNORMAL LOW (ref >60)
GFR calc non Af Amer: 9 mL/min — ABNORMAL LOW (ref >60)
Glucose, Bld: 133 mg/dL — ABNORMAL HIGH (ref 70–99)
Potassium: 4.2 mmol/L (ref 3.5–5.1)
Sodium: 147 mmol/L — ABNORMAL HIGH (ref 135–145)

## 2019-10-17 LAB — SODIUM
Sodium: 149 mmol/L — ABNORMAL HIGH (ref 135–145)
Sodium: 151 mmol/L — ABNORMAL HIGH (ref 135–145)
Sodium: 152 mmol/L — ABNORMAL HIGH (ref 135–145)

## 2019-10-17 LAB — TRIGLYCERIDES: Triglycerides: 126 mg/dL (ref <150)

## 2019-10-17 SURGERY — INVASIVE LAB ABORTED CASE

## 2019-10-17 MED ORDER — PROPOFOL 1000 MG/100ML IV EMUL
5.0000 ug/kg/min | INTRAVENOUS | Status: DC
  Administered 2019-10-17 (×2): 5 ug/kg/min via INTRAVENOUS
  Administered 2019-10-18: 30 ug/kg/min via INTRAVENOUS
  Filled 2019-10-17 (×2): qty 100

## 2019-10-17 MED ORDER — FENTANYL CITRATE (PF) 100 MCG/2ML IJ SOLN
INTRAMUSCULAR | Status: DC | PRN
  Administered 2019-10-17: 25 ug via INTRAVENOUS

## 2019-10-17 MED ORDER — MIDAZOLAM HCL 2 MG/2ML IJ SOLN
INTRAMUSCULAR | Status: DC | PRN
  Administered 2019-10-17: 2 mg via INTRAVENOUS

## 2019-10-17 MED ORDER — MIDAZOLAM HCL (PF) 5 MG/ML IJ SOLN
INTRAMUSCULAR | Status: AC
  Filled 2019-10-17: qty 2

## 2019-10-17 MED ORDER — FENTANYL CITRATE (PF) 100 MCG/2ML IJ SOLN
INTRAMUSCULAR | Status: AC
  Filled 2019-10-17: qty 4

## 2019-10-17 SURGICAL SUPPLY — 14 items

## 2019-10-17 NOTE — Progress Notes (Addendum)
[facility] Progress Note   Subjective:   Patient seen and examined at bedside.  Per wife he was alert earlier this AM but was just given Fentanyl to help him rest.  TF held today for possible EGD this afternoon.   Objective Vitals:   11/10/2019 0800 10/23/2019 0900 10/19/2019 0949 10/18/2019 1117  BP: (!) 147/96 (!) 141/82 133/75 (!) 141/77  Pulse: 94 83  88  Resp: (!) 29 (!) 22  (!) 26  Temp: 99.1 F (37.3 C) 99 F (37.2 C)    TempSrc: Esophageal     SpO2: 100% 100%  99%  Weight:       Physical Exam General:NAD, intubated Heart:RRR, no mrg Lungs:CTA anterolaterally, no wheeze, rales or rhonchi Abdomen:soft, NTND, +BS Extremities:no LE edema Dialysis Access: R IJ TDC, RU AVG +b   Filed Weights   10/15/19 0500 10/16/19 0500 11/11/2019 0448  Weight: 73.3 kg 76.3 kg 79.3 kg    Intake/Output Summary (Last 24 hours) at 10/28/2019 1217 Last data filed at 11/09/2019 1200 Gross per 24 hour  Intake 2584.15 ml  Output 2930 ml  Net -345.85 ml    Additional Objective Labs: Basic Metabolic Panel: Recent Labs  Lab 10/12/19 1610  10/13/19 0515  10/13/19 1633  10/15/19 1203  10/16/19 0540  10/16/19 2114 11/11/2019 0450 11/03/2019 0855  NA 143   < > 145   < >  --    < > 156*   < > 160*   < > 145 147* 149*  K  --   --  4.9  --   --    < > 4.8  --  5.0  --   --  4.2  --   CL  --   --  107  --   --    < > 121*  --  126*  --   --  105  --   CO2  --   --  25  --   --    < > 22  --  21*  --   --  24  --   GLUCOSE  --   --  119*  --   --    < > 107*  --  111*  --   --  133*  --   BUN  --   --  26*  --   --    < > 57*  --  85*  --   --  66*  --   CREATININE  --   --  8.97*  --   --    < > 8.58*  --  10.36*  --   --  7.34*  --   CALCIUM  --   --  8.9  --   --    < > 8.6*  --  9.1  --   --  8.7*  --   PHOS 7.4*  --  4.4  --  4.5  --   --   --   --   --   --   --   --    < > = values in this interval not displayed.   Liver Function Tests: Recent Labs  Lab 09/26/2019 0540  10/13/19 0515  AST 20 33  ALT 16 15  ALKPHOS 70 54  BILITOT 0.8 0.7  PROT 7.1 6.1*  ALBUMIN 4.2 3.4*  CBC: Recent Labs  Lab 09/17/2019 0540  10/14/19 0430 10/15/19 0538 10/15/19 1200 10/16/19 0540 10/16/19  1300 10/16/19 2114 10/28/2019 0450  WBC 7.1   < > 12.0* 10.3 10.8* 10.9*  --   --  8.7  NEUTROABS 3.9  --   --   --   --   --   --   --   --   HGB 9.6*   < > 7.4* 6.4* 7.0* 6.9* 8.3* 8.8* 7.2*  HCT 30.6*   < > 24.3* 21.3* 22.5* 23.5* 26.6* 25.0* 23.2*  MCV 93.3   < > 98.4 98.2 96.6 98.7  --   --  93.9  PLT 387   < > 233 235 236 330  --   --  285   < > = values in this interval not displayed.   Blood Culture    Component Value Date/Time   SDES URINE, CATHETERIZED 10/14/2019 0557   SPECREQUEST  10/14/2019 0557    NONE Performed at [facility], [place] [address] [place],  [address]    CULT MULTIPLE SPECIES PRESENT, SUGGEST RECOLLECTION (A) 10/14/2019 0557   REPTSTATUS 10/15/2019 FINAL 10/14/2019 0557   CBG: Recent Labs  Lab 10/16/19 2025 10/16/19 2318 11/05/2019 0324 11/02/2019 0757 11/01/2019 1147  GLUCAP 125* 125* 122* 115* 112*   Studies/Results: Ct Head Wo Contrast  Result Date: 10/16/2019 CLINICAL DATA:  Initial evaluation for acute altered mental status, known intracranial hemorrhage. EXAM: CT HEAD WITHOUT CONTRAST TECHNIQUE: Contiguous axial images were obtained from the base of the skull through the vertex without intravenous contrast. COMPARISON:  Prior head CT from 10/15/2019. FINDINGS: Brain: Known intraparenchymal hemorrhage centered at the right basal ganglia again seen, not significantly changed in size measuring 3.3 x 6.2 x 4.1 cm. Surrounding low-density vasogenic edema little interval changed as well. Regional mass effect with localized right-to-left midline shift of up to 7 mm also unchanged. Associated intraventricular extension with blood throughout the ventricular system again seen, similar. Stable ventricular size and morphology. Right frontal  approach ventriculostomy remains in place with tip terminating near the right thalamus, stable. No other new site of hemorrhage. There is subtly decreased cortical sulcation throughout the brain as compared to previous, suggesting worsened cerebral edema. Mild basilar cistern crowding without transtentorial herniation. No acute large vessel territory infarct. Small chronic left basal ganglia lacunar infarct again noted. No extra-axial fluid collection. Vascular: No hyperdense vessel. Skull: Right frontal approach ventriculostomy in place. Sinuses/Orbits: Globes and orbital soft tissues demonstrate no acute finding. Polypoid mucosal thickening noted throughout the paranasal sinuses. Endotracheal tube partially visualized. No significant mastoid effusion. Other: None. IMPRESSION: 1. No significant interval change in size and appearance of right basal ganglia hemorrhage with intraventricular extension. Stable regional mass effect with up to 7 mm of localized right-to-left midline shift. 2. Stable right frontal approach ventriculostomy with tip terminating near the right thalamus. Stable ventricular size and morphology. 3. Slightly decreased cortical sulcation throughout the brain as compared to previous, suggesting worsened cerebral edema. 4. No other new acute intracranial abnormality. Electronically Signed   By: Jeannine Boga M.D.   On: 10/16/2019 20:27   Dg Hand 2 View Right  Result Date: 10/25/2019 CLINICAL DATA:  Right index finger edema EXAM: RIGHT HAND - 2 VIEW COMPARISON:  2016 FINDINGS: There is enlargement of the soft tissues of the right second digit at the level the distal proximal phalanx and PIP joint. This was present but smaller on the 2016 study. There is no associated erosive change or periosteal reaction. The second PIP joint is unremarkable. No additional findings. IMPRESSION: Soft tissue enlargement  of the proximal right second digit without adjacent osseous or joint abnormality. This  was present but smaller on the 2016 study and is likely benign with a nonaggressive appearance. Electronically Signed   By: Macy Mis M.D.   On: 10/29/2019 10:15    Medications: . sodium chloride Stopped (10/16/19 2119)  . cefTRIAXone (ROCEPHIN)  IV Stopped (10/21/2019 1100)  . clevidipine Stopped (10/15/2019 0600)  . feeding supplement (VITAL 1.5 CAL) 1,000 mL (11/09/2019 1000)  . pantoprozole (PROTONIX) infusion 8 mg/hr (10/15/2019 1200)  . sodium chloride (hypertonic) 50 mL/hr at 10/20/2019 1200   . sodium chloride   Intravenous Once  . amLODipine  10 mg Per Tube QHS  . carvedilol  25 mg Per Tube BID WC  . chlorhexidine gluconate (MEDLINE KIT)  15 mL Mouth Rinse BID  . Chlorhexidine Gluconate Cloth  6 each Topical Q0600  . Chlorhexidine Gluconate Cloth  6 each Topical Q0600  . cloNIDine  0.1 mg Per Tube TID  . [START ON 10/23/2019] darbepoetin (ARANESP) injection - DIALYSIS  200 mcg Intravenous Q Mon-HD  . feeding supplement (PRO-STAT SUGAR FREE 64)  30 mL Per Tube TID  . fentaNYL (SUBLIMAZE) injection  50 mcg Intravenous Once  . hydrALAZINE  100 mg Per Tube TID WC  . mouth rinse  15 mL Mouth Rinse 10 times per day  . minoxidil  10 mg Per Tube BID  . [START ON 10/20/2019] pantoprazole  40 mg Intravenous Q12H  . senna-docusate  1 tablet Per Tube BID  . sodium chloride flush  10-40 mL Intracatheter Q12H  . spironolactone  25 mg Per Tube Daily    Dialysis Orders: [facility] MWF 4h 29mn 73.5kg 2.2 bath RU AVG (placed 08/15/19) / R TDCHep none - darbe 200 /wk - hect 3 - venofer 100 x 10 , completed  Assessment/Plan: 1. Acute intracranial bleed - w/ midline shift and IVH. s/p EVD. Has been 3% salineday #6 of total approx 7d per neurology, goal Na+ 150-155. Held yesterday d/t hypernatremia. Per primary/neuro.  2. ESRD - usual HD MWF. AVG placed 08/15/19, have been using x 2 wks at OP unit per pt's wife, will continue to use here.Consult [facility] for TDC removal once stable. Recent start in  Sept 2020.Next HD tomorrow. Have been using higherNa+ dialysate levelsto help ^Na+ level. Goal Na+ 150-155.Used 148 flat on Sat, used 145 on Monday. K 4.2 today. 3. Anemia ckd - Hbdown today 6.4 > 7.0 > 6.9>>8.8>7.2. s/p 2units pRBC on 11/2, Hgb increased to 8.8 and then dropped back to 7.2 this AM. FOBT +. GI has been consulted.Continuearanesp 200 mcg q Monday.Transfuse prn.  4. HTN/volume - BP in goal this AM, minoxidil increased to 187mBID yesterday. Continue cleviprex prn and his 4 home BP meds per NG, goal SBP < 160.Weights up again this AM, UF goal 2.5-3.5L with HD tomorrow.   5. H/o CVA - July 2020 6. UGIB - Dark liquid stool w/o significant response to transfusion. EGD in 08/2019 w/duodenitis w/erosions.  tentative plan for repeat EGD today.  LiJen MowPA-C [facility] Pager: [phone]/02/2019,12:17 PM  LOS: 6 days   I have seen and examined this patient and agree with plan and assessment in the above note with renal recommendations/intervention highlighted.  Goal sodium 150-155 and now down to 149.  Will used elevated Na modeling for HD tomorrow and follow.  JoGovernor Rooksoladonato,MD 10/30/2019 3:32 PM

## 2019-10-17 NOTE — Progress Notes (Signed)
Neurosurgery Service Progress Note  Subjective: NAE ON, stopped FC and had to have EVD re-opened, started FC again overnight  Objective: Vitals:   10/24/2019 0500 10/24/2019 0530 10/19/2019 0600 11/05/2019 0630  BP: 126/71 128/68 128/70 126/76  Pulse: 85 82 82 81  Resp: (!) 22 (!) 21 (!) 25 20  Temp: 99 F (37.2 C) 99 F (37.2 C) 99.1 F (37.3 C) 99.1 F (37.3 C)  TempSrc:      SpO2: 100% 100% 100% 100%  Weight:       Temp (24hrs), Avg:99 F (37.2 C), Min:97.1 F (36.2 C), Max:101.1 F (38.4 C)  CBC Latest Ref Rng & Units 10/18/2019 10/16/2019 10/16/2019  WBC 4.0 - 10.5 K/uL 8.7 - -  Hemoglobin 13.0 - 17.0 g/dL 7.2(L) 8.8(L) 8.3(L)  Hematocrit 39.0 - 52.0 % 23.2(L) 25.0(L) 26.6(L)  Platelets 150 - 400 K/uL 285 - -   BMP Latest Ref Rng & Units 11/04/2019 10/16/2019 10/16/2019  Glucose 70 - 99 mg/dL 133(H) - -  BUN 6 - 20 mg/dL 66(H) - -  Creatinine 0.61 - 1.24 mg/dL 7.34(H) - -  BUN/Creat Ratio 9 - 20 - - -  Sodium 135 - 145 mmol/L 147(H) 145 144  Potassium 3.5 - 5.1 mmol/L 4.2 - -  Chloride 98 - 111 mmol/L 105 - -  CO2 22 - 32 mmol/L 24 - -  Calcium 8.9 - 10.3 mg/dL 8.7(L) - -    Intake/Output Summary (Last 24 hours) at 11/07/2019 0713 Last data filed at 11/06/2019 0600 Gross per 24 hour  Intake 2862.58 ml  Output 2888 ml  Net -25.42 ml    Current Facility-Administered Medications:  .  0.9 %  sodium chloride infusion (Manually program via Guardrails IV Fluids), , Intravenous, Once, Deterding, Guadelupe Sabin, MD .  0.9 %  sodium chloride infusion, , Intravenous, PRN, Rosalin Hawking, MD, Stopped at 10/16/19 2119 .  acetaminophen (TYLENOL) 160 MG/5ML solution 650 mg, 650 mg, Per Tube, Q4H PRN, Rosalin Hawking, MD, 650 mg at 10/16/19 1346 .  amLODipine (NORVASC) tablet 10 mg, 10 mg, Per Tube, QHS, Biby, Sharon L, NP, 10 mg at 10/16/19 2113 .  carvedilol (COREG) tablet 25 mg, 25 mg, Per Tube, BID WC, Aroor, Lanice Schwab, MD, 25 mg at 10/16/19 1834 .  cefTRIAXone (ROCEPHIN) 1 g in sodium  chloride 0.9 % 100 mL IVPB, 1 g, Intravenous, Q24H, Rosalin Hawking, MD, Stopped at 10/16/19 1022 .  chlorhexidine gluconate (MEDLINE KIT) (PERIDEX) 0.12 % solution 15 mL, 15 mL, Mouth Rinse, BID, Aroor, Lanice Schwab, MD, 15 mL at 10/16/19 1925 .  Chlorhexidine Gluconate Cloth 2 % PADS 6 each, 6 each, Topical, Q0600, Roney Jaffe, MD, 6 each at 10/16/19 0517 .  Chlorhexidine Gluconate Cloth 2 % PADS 6 each, 6 each, Topical, Q0600, Roney Jaffe, MD, 6 each at 10/23/2019 0509 .  clevidipine (CLEVIPREX) infusion 0.5 mg/mL, 0-21 mg/hr, Intravenous, Continuous, Rosalin Hawking, MD, Last Rate: 4 mL/hr at 10/19/2019 0600, 2 mg/hr at 11/03/2019 0600 .  cloNIDine (CATAPRES) tablet 0.1 mg, 0.1 mg, Per Tube, TID, Rosalin Hawking, MD, 0.1 mg at 10/16/19 2113 .  [START ON 10/23/2019] Darbepoetin Alfa (ARANESP) injection 200 mcg, 200 mcg, Intravenous, Q Mon-HD, Roney Jaffe, MD .  docusate (COLACE) 50 MG/5ML liquid 100 mg, 100 mg, Per Tube, BID PRN, Merlene Laughter F, NP .  feeding supplement (PRO-STAT SUGAR FREE 64) liquid 30 mL, 30 mL, Per Tube, TID, Mannam, Praveen, MD, 30 mL at 10/16/19 2112 .  feeding supplement (VITAL 1.5 CAL)  liquid 1,000 mL, 1,000 mL, Per Tube, Continuous, Mannam, Praveen, MD, Last Rate: 50 mL/hr at 10/16/19 1347, 1,000 mL at 10/16/19 1347 .  fentaNYL (SUBLIMAZE) injection 50 mcg, 50 mcg, Intravenous, Once, Deterding, Guadelupe Sabin, MD .  fentaNYL (SUBLIMAZE) injection 50 mcg, 50 mcg, Intravenous, Q15 min PRN, Mannam, Praveen, MD .  fentaNYL (SUBLIMAZE) injection 50-200 mcg, 50-200 mcg, Intravenous, Q30 min PRN, Mannam, Praveen, MD, 50 mcg at 11/09/2019 0333 .  heparin injection 1,000-6,000 Units, 1,000-6,000 Units, Intravenous, PRN, Aroor, Lanice Schwab, MD, 1,000 Units at 10/12/19 2215 .  hydrALAZINE (APRESOLINE) tablet 100 mg, 100 mg, Per Tube, TID WC, Biby, Sharon L, NP, 100 mg at 10/16/19 1834 .  labetalol (NORMODYNE) injection 10-20 mg, 10-20 mg, Intravenous, Q10 min PRN, Rosalin Hawking, MD, 20 mg at  10/16/19 1430 .  MEDLINE mouth rinse, 15 mL, Mouth Rinse, 10 times per day, Aroor, Karena Addison R, MD, 15 mL at 11/10/2019 0538 .  midazolam (VERSED) injection 2 mg, 2 mg, Intravenous, Q15 min PRN, Mannam, Praveen, MD, 2 mg at 10/16/19 1643 .  midazolam (VERSED) injection 2 mg, 2 mg, Intravenous, Q2H PRN, Mannam, Praveen, MD .  minoxidil (LONITEN) tablet 10 mg, 10 mg, Per Tube, BID, Rosalin Hawking, MD, 10 mg at 10/16/19 2112 .  pantoprazole (PROTONIX) 80 mg in sodium chloride 0.9 % 250 mL (0.32 mg/mL) infusion, 8 mg/hr, Intravenous, Continuous, Willia Craze, NP, Last Rate: 25 mL/hr at 11/01/2019 0600, 8 mg/hr at 10/30/2019 0600 .  [START ON 10/20/2019] pantoprazole (PROTONIX) injection 40 mg, 40 mg, Intravenous, Q12H, Tye Savoy M, NP .  propofol (DIPRIVAN) 1000 MG/100ML infusion, 5-80 mcg/kg/min, Intravenous, Continuous, [name], MD, Stopped at 10/15/19 1038 .  senna-docusate (Senokot-S) tablet 1 tablet, 1 tablet, Per Tube, BID, Aroor, Lanice Schwab, MD, 1 tablet at 10/16/19 2113 .  sodium chloride (hypertonic) 3 % solution, , Intravenous, Continuous, Rosalin Hawking, MD, Last Rate: 50 mL/hr at 11/03/2019 0600 .  sodium chloride flush (NS) 0.9 % injection 10-40 mL, 10-40 mL, Intracatheter, Q12H, Aroor, Lanice Schwab, MD, 10 mL at 10/16/19 2113 .  sodium chloride flush (NS) 0.9 % injection 10-40 mL, 10-40 mL, Intracatheter, PRN, Aroor, Karena Addison R, MD .  spironolactone (ALDACTONE) tablet 25 mg, 25 mg, Per Tube, Daily, Biby, Sharon L, NP, 25 mg at 10/16/19 8184   Physical Exam: Intubated, gaze dysconjugate, eyes open to voice, FC on R reliably  Assessment & Plan: 33 y.o. man w/ ESRD on ASA/plavix s/p R ICH/IVH s/p R EVD. Rpt CTH w/ malpositioned, but patent EVD. 11/2 failed clamp trial 2/2 exam  -will try another clamp trial tomorrow  Judith Part  10/25/2019 7:13 AM

## 2019-10-17 NOTE — Progress Notes (Signed)
SLP Cancellation Note  Patient Details Name: William [name] MRN: [id number] DOB: 1986-08-07   Cancelled treatment:        On vent. Will follow.   Houston Siren 10/16/2019, 6:31 AM   Orbie Pyo Colvin Caroli.Ed Risk analyst [phone] Office [phone]

## 2019-10-17 NOTE — Progress Notes (Signed)
Transported pt to CT scan and back to [id number] without complications.

## 2019-10-17 NOTE — Progress Notes (Signed)
Progress Note    ASSESSMENT AND PLAN:   52. 33 yo male with HTN, CVA on plavix / asa, ESRD ( recently started dialysis), duodenitis, chronic anemia. Admitted with intracranial hemorrhage.   2. Upper GI bleed. Dark liquid stool on exam this am and no significant response to transfusion. Known duodenitis with erosions on EGD in mid September 2020. No other pathology found at time of EGD -continue PPI gtt.  -hold TF for possible EGD later today  3. Acute on chronic anemia. After another [facility] yesterday hgb rose from 6.9 to 8.8 but drifted overnight to 7.2. Suspect hgb will continue to drop -monitor H+H, transfuse prn    OBJETIVE:     Vital signs in last 24 hours: Temp:  [97.2 F (36.2 C)-101.1 F (38.4 C)] 99.1 F (37.3 C) (11/03 0700) Pulse Rate:  [35-117] 94 (11/03 0732) Resp:  [19-35] 27 (11/03 0732) BP: (122-187)/(68-107) 136/80 (11/03 0732) SpO2:  [95 %-100 %] 100 % (11/03 0732) FiO2 (%):  [40 %] 40 % (11/03 0732) Weight:  [79.3 kg] 79.3 kg (11/03 0448) Last BM Date: 10/15/19 General:   Awake,  in NAD EENT:  Right gaze Heart:  Regular rate and rhythm;  No lower extremity edema   Pulm: On vent. Chest clear.  Abdomen:  Soft, nondistended, nontender.  Normal bowel sounds.    Rectal: incontinent of melenic stool       Neurologic:  Can follow some commands. Squeezes my hand with his right hand.    Intake/Output from previous day: 11/02 0701 - 11/03 0700 In: 2937.5 [I.V.:1863.3; Blood:270; NG/GT:610.8; IV Piggyback:193.3] Out: 2899 [Drains:148] Intake/Output this shift: No intake/output data recorded.  Lab Results: Recent Labs    10/15/19 1200 10/16/19 0540 10/16/19 1300 10/16/19 2114 10/29/2019 0450  WBC 10.8* 10.9*  --   --  8.7  HGB 7.0* 6.9* 8.3* 8.8* 7.2*  HCT 22.5* 23.5* 26.6* 25.0* 23.2*  PLT 236 330  --   --  285   BMET Recent Labs    10/15/19 1203  10/16/19 0540  10/16/19 1935 10/16/19 2114 10/27/2019 0450  NA 156*   < > 160*   < > 144 145  147*  K 4.8  --  5.0  --   --   --  4.2  CL 121*  --  126*  --   --   --  105  CO2 22  --  21*  --   --   --  24  GLUCOSE 107*  --  111*  --   --   --  133*  BUN 57*  --  85*  --   --   --  66*  CREATININE 8.58*  --  10.36*  --   --   --  7.34*  CALCIUM 8.6*  --  9.1  --   --   --  8.7*   < > = values in this interval not displayed.    Ct Head Wo Contrast  Result Date: 10/16/2019 CLINICAL DATA:  Initial evaluation for acute altered mental status, known intracranial hemorrhage. EXAM: CT HEAD WITHOUT CONTRAST TECHNIQUE: Contiguous axial images were obtained from the base of the skull through the vertex without intravenous contrast. COMPARISON:  Prior head CT from 10/15/2019. FINDINGS: Brain: Known intraparenchymal hemorrhage centered at the right basal ganglia again seen, not significantly changed in size measuring 3.3 x 6.2 x 4.1 cm. Surrounding low-density vasogenic edema little interval changed as well. Regional mass effect with localized  right-to-left midline shift of up to 7 mm also unchanged. Associated intraventricular extension with blood throughout the ventricular system again seen, similar. Stable ventricular size and morphology. Right frontal approach ventriculostomy remains in place with tip terminating near the right thalamus, stable. No other new site of hemorrhage. There is subtly decreased cortical sulcation throughout the brain as compared to previous, suggesting worsened cerebral edema. Mild basilar cistern crowding without transtentorial herniation. No acute large vessel territory infarct. Small chronic left basal ganglia lacunar infarct again noted. No extra-axial fluid collection. Vascular: No hyperdense vessel. Skull: Right frontal approach ventriculostomy in place. Sinuses/Orbits: Globes and orbital soft tissues demonstrate no acute finding. Polypoid mucosal thickening noted throughout the paranasal sinuses. Endotracheal tube partially visualized. No significant mastoid effusion.  Other: None. IMPRESSION: 1. No significant interval change in size and appearance of right basal ganglia hemorrhage with intraventricular extension. Stable regional mass effect with up to 7 mm of localized right-to-left midline shift. 2. Stable right frontal approach ventriculostomy with tip terminating near the right thalamus. Stable ventricular size and morphology. 3. Slightly decreased cortical sulcation throughout the brain as compared to previous, suggesting worsened cerebral edema. 4. No other new acute intracranial abnormality. Electronically Signed   By: Jeannine Boga M.D.   On: 10/16/2019 20:27      Active Problems:   ICH (intracerebral hemorrhage) (HCC)   Acute respiratory failure with hypoxia (HCC)   Malnutrition of moderate degree   Cytotoxic brain edema ([place])   Obstructive hydrocephalus ([place])     LOS: 6 days   Tye Savoy ,NP 10/19/2019, 9:09 AM

## 2019-10-17 NOTE — Progress Notes (Addendum)
NAME:  William [name], MRN:  [id number], DOB:  09-May-1986, LOS: 6 ADMISSION DATE:  10/05/2019, CONSULTATION DATE:  09/16/2019 REFERRING MD:  Ward  CHIEF COMPLAINT:  AMS.   Brief History    33 y.o. male with PMH of HTN, ESRD and cocaine use with previous stroke on ASA and plavix who woke up 10/28 feeling short of breath and wife noted slurred speech and facial droop.  CT showed large R hemorrhage, pt deteriorated and required intubation.  Pt given DDAVP, neurosurgery and neurology consulted and PCCM consulted.   Past Medical History  CVA 06/2019,HTN,HLD,ESRD iHD TTHS,Cocaine abuse.,Anemia ,Right AV graft placed 08/15/2019  Significant Hospital Events   10/28 > admit, intubated with large R BG ICH; IVD in parenchyma of brain but felt w/ coagulopathy more risk than benefit to re-attempt   10/28: posturing. Still hypertensive. DBP goal <160. Starting on HT saline.   11/1- 1 unit PRBC 11/2- HB drifting down- GI consulted. Om PPI drip. Off sedation on PSV weans 5/5 and following some commands 11/3- Failed EVD clamping due to poor mental status  Consults:  Neurosurgery PCCM. neprhology  GI  Procedures:  ETT 10/28 >> Left IJ CVL 10/28>>>  Significant Diagnostic Tests:  [facility] 10/28 >> right BG ICH with Intraventricular extension and MLS (formal read pending). CT brain 10/28: 1. A 6.8 x 3.6 x 4.9 cm parenchymal hemorrhage centered within the right basal ganglia has not significantly changed in size. Mass effect with partial effacement of ventricular system and unchanged 10 mm leftward midline shift. 2. Redemonstrated intraventricular extension of hemorrhage. Hemorrhage within the right lateral ventricle atrium has increased from prior exam. 3. Interval placement of a right frontal approach ventricular catheter. It is unclear whether the catheter tip terminates within the right basal ganglia or foramen of [place]. However, there are small foci of pneumocephalus within the right frontal horn  CT head  on 10/30- Unchanged right basal ganglia and intraventricular clot. Improved ventriculomegaly. Stable 1 cm leftward midline shift.  CT head 11/1: essentially unchanged-  CT Head 11/2: R basal ganglia hemorrhage with intraventricular extension, stable mass 52mm with R to L midline shift. R frontal EVD tip terminating near R thalamus. Interval worsening of cerebral edema  Micro Data:  SARS CoV2 10/28 >> neg urine culture 10/31-multiple species.  Antimicrobials:  Ceftriaxone 10/31>>  Interim history/subjective:  PSV/CPAP well tolerated at 10/5 Off cleviprex On hypertonic saline   Objective:  Blood pressure 136/80, pulse 94, temperature 99.1 F (37.3 C), resp. rate (!) 27, weight 79.3 kg, SpO2 100 %.    Vent Mode: PSV;CPAP FiO2 (%):  [40 %] 40 % Set Rate:  [18 bmp] 18 bmp Vt Set:  [560 mL-5060 mL] 5060 mL PEEP:  [5 cmH20] 5 cmH20 Pressure Support:  [5 cmH20-10 cmH20] 10 cmH20 Plateau Pressure:  [17 cmH20-27 cmH20] 17 cmH20   Intake/Output Summary (Last 24 hours) at 11/11/2019 0832 Last data filed at 11/11/2019 0700 Gross per 24 hour  Intake 2937.48 ml  Output 2895 ml  Net 42.48 ml   Filed Weights   10/15/19 0500 10/16/19 0500 10/27/2019 0448  Weight: 73.3 kg 76.3 kg 79.3 kg    Examination: Gen: Chronically and critically ill appearing adult M, intubated NAD HEENT: R EVD. Anicteric sclera. ETT secure. Pink mmm, trachea midline  Lungs: CTA, diminished bibasilar sounds. Symmetrical chest expansion and no accessory use on PSV/CPAP  CV: RRR s1s2 no rgm.  Abd: Soft round ndnt. + bowel sounds Ext. BUE edema. R index finger with proximal  edema. Indeterminate nailbeds with possible clubbing and brown toe nailbed discoloration possibly previously applied nail polish Skin: clean, dry, warm without rash  Neuro: Opens eyes spontaneously and to stimulation. Weak but follows commands RUE RLE. Moved LUE spontaneously, not to command  Assessment & Plan:   Right basal ganglia intracerebral  hemorrhage, complicated by intraventricular bleeding History of prior CVA -R EVD in place -S/p DDAVP, given concern about platelet dysfunction in patient with end-stage renal disease  Plan SBP goal < 160 ; cleviprex and labet. PRN. Scheduled antihypertensives as below Goal Na is 150-155; hypertonic started per Neuro Aspirin and Plavix on hold Intraventricular drain management per neurosurgery Continue neurochecks  Acute hypoxemic respiratory failure -In the setting of acute basal ganglia intracerebral hemorrhage -Cardiomegaly Plan Continue efforts at weaning Mental status is not optimized for extubation trial at this point Intermittent CXR Pulm hygiene   ESRD on HD Plan -Has right AV graft and right tunneled HD cath in place Plan Nephrology following for iHD  HTN Plan Clonidine, Spironolactone, Hydral, Coreg scheduled PRN cleviprex and labetalol as above  Goal SBP < 160   History of polysubstance abuse, history of cocaine abuse Plan Supportive care   Anemia, guaiac positive stool EGD in September shows duodenitis with erosion Plan GI following, recommend optimizing medical management Transfuse for hemoglobin less than 7, anticipate need for repeat transfusion in near future however no indication at present  Protonix gtt  R index finger edema Plan Xray R hand   Best Practice:  Diet: Tube feeds Pain/Anxiety/Delirium protocol (if indicated): Fentanyl PRN / Midazolam PRN.  RASS goal 0  VAP protocol (if indicated): In place. DVT prophylaxis: SCDs GI prophylaxis: PPI gtt  Glucose control: SSI. Mobility: Bedrest Code Status: Full Family Communication: pending 11/3 Disposition: ICU. Guarded prognosis   CRITICAL CARE Performed by: Cristal Generous   Total critical care time: 40 minutes  Critical care time was exclusive of separately billable procedures and treating other patients. Critical care was necessary to treat or prevent imminent or life-threatening  deterioration.  Critical care was time spent personally by me on the following activities: development of treatment plan with patient and/or surrogate as well as nursing, discussions with consultants, evaluation of patient's response to treatment, examination of patient, obtaining history from patient or surrogate, ordering and performing treatments and interventions, ordering and review of laboratory studies, ordering and review of radiographic studies, pulse oximetry and re-evaluation of patient's condition.  Eliseo Gum MSN, AGACNP-BC [place] [id number] If no answer, [id number] 10/15/2019, 8:32 AM

## 2019-10-17 NOTE — Progress Notes (Signed)
STROKE TEAM PROGRESS NOTE   INTERVAL HISTORY Pt RN and GI service at bedside. Pt initially eyes closed but easily arousable and maintained open. Able to follow simple commands on the right hand, but not right foot or central commands today. Still has right gaze preference. Hb drop from post transfusion 8.8 down to 7.2. on PPI gtt, plan for EGD this pm.   OBJECTIVE Vitals:   10/25/2019 0600 11/12/2019 0630 11/04/2019 0700 11/11/2019 0732  BP: 128/70 126/76 134/75 136/80  Pulse: 82 81 82 94  Resp: (!) 25 20 20  (!) 27  Temp: 99.1 F (37.3 C) 99.1 F (37.3 C) 99.1 F (37.3 C)   TempSrc:      SpO2: 100% 100% 100% 100%  Weight:        CBC:  Recent Labs  Lab 10/10/2019 0540  10/16/19 0540  10/16/19 2114 11/11/2019 0450  WBC 7.1   < > 10.9*  --   --  8.7  NEUTROABS 3.9  --   --   --   --   --   HGB 9.6*   < > 6.9*   < > 8.8* 7.2*  HCT 30.6*   < > 23.5*   < > 25.0* 23.2*  MCV 93.3   < > 98.7  --   --  93.9  PLT 387   < > 330  --   --  285   < > = values in this interval not displayed.    Basic Metabolic Panel:  Recent Labs  Lab 10/13/19 0515  10/13/19 1633  10/16/19 0540  10/16/19 2114 10/31/2019 0450  NA 145   < >  --    < > 160*   < > 145 147*  K 4.9  --   --    < > 5.0  --   --  4.2  CL 107  --   --    < > 126*  --   --  105  CO2 25  --   --    < > 21*  --   --  24  GLUCOSE 119*  --   --    < > 111*  --   --  133*  BUN 26*  --   --    < > 85*  --   --  66*  CREATININE 8.97*  --   --    < > 10.36*  --   --  7.34*  CALCIUM 8.9  --   --    < > 9.1  --   --  8.7*  MG 2.2  --  2.2  --   --   --   --   --   PHOS 4.4  --  4.5  --   --   --   --   --    < > = values in this interval not displayed.    Lipid Panel:     Component Value Date/Time   CHOL 253 (H) 06/23/2019 0301   TRIG 173 (H) 10/15/2019 0539   HDL 67 06/23/2019 0301   CHOLHDL 3.8 06/23/2019 0301   VLDL 28 06/23/2019 0301   LDLCALC 158 (H) 06/23/2019 0301   HgbA1c:  Lab Results  Component Value Date   HGBA1C 4.9  06/23/2019   Urine Drug Screen:     Component Value Date/Time   LABOPIA NONE DETECTED 10/09/2019 0648   COCAINSCRNUR NONE DETECTED 09/19/2019 0648   [place] DETECTED 10/05/2019 [id number]  AMPHETMU NONE DETECTED 09/29/2019 0648   THCU NONE DETECTED 10/02/2019 0648   LABBARB NONE DETECTED 10/01/2019 0648    Alcohol Level No results found for: [employer]  IMAGING  CT Head Wo Contrast 11/03/2019 1. No significant interval change in size and appearance of right basal ganglia hemorrhage with intraventricular extension. Stable regional mass effect with up to 7 mm of localized right-to-left midline shift. 2. Stable right frontal approach ventriculostomy with tip terminating near the right thalamus. Stable ventricular size and morphology. 3. Slightly decreased cortical sulcation throughout the brain as compared to previous, suggesting worsened cerebral edema. 4. No other new acute intracranial abnormality.  CT Head Wo Contrast 10/15/2019 1. Unchanged size of large intraparenchymal hematoma centered in the right basal ganglia with intraventricular extension. 2. Decreased size of the lateral ventricles with unchanged 6 mm of leftward midline shift. 3. No new site of hemorrhage.  Ct Head Wo Contrast 10/13/2019 1. Unchanged right basal ganglia and intraventricular clot.  2. Improved ventriculomegaly.  3. Stable 1 cm leftward midline shift.   Dg Chest Port 1 View 10/13/2019 1. Unremarkable hardware positioning.  2. Improved aeration but persistent dense opacity behind the heart.   Transthoracic Echocardiogram  1. The left ventricle has hyperdynamic systolic function, with an ejection fraction of >65%. The cavity size was decreased. There is severe concentric left ventricular hypertrophy. Left ventricular diastolic parameters were normal. No evidence of left  ventricular regional wall motion abnormalities.  2. The right ventricle has normal systolic function. The cavity was normal. There is no  increase in right ventricular wall thickness. Right ventricular systolic pressure could not be assessed.  3. Small pericardial effusion.  4. No evidence of mitral valve stenosis.  5. The aortic valve is tricuspid. No stenosis of the aortic valve.  EEG 10/15/2019 This is an abnormal electroencephalogram secondary to focal right hemispheric slowing superimposed on a diffusely slow background.  Occasional right frontal sharp waves are noted as well.   These findings are consistent with the patient's history of right ICH with right EVD placement.  No subclinical seizure activity is noted.     PHYSICAL EXAM   Temp:  [97.1 F (36.2 C)-101.1 F (38.4 C)] 99.1 F (37.3 C) (11/03 0700) Pulse Rate:  [35-117] 94 (11/03 0732) Resp:  [19-35] 27 (11/03 0732) BP: (122-187)/(68-107) 136/80 (11/03 0732) SpO2:  [95 %-100 %] 100 % (11/03 0732) FiO2 (%):  [40 %] 40 % (11/03 0732) Weight:  [79.3 kg] 79.3 kg (11/03 0448)  General - Well nourished, well developed, intubated off sedation.  Ophthalmologic - fundi not visualized due to noncooperation.  Cardiovascular - Regular rate and rhythm.  Neuro - intubated off sedation, easily open eyes on voice and maintaining opening, right gaze preference, barely cross midline. Follow commands on the right hand, but slow to react. Not follow central commands or on the right foot today. Blinking to visual threat on the right but not on the left, not tracking, PERRL. Corneal reflex present, gag and cough present. Breathing over the vent.  Facial symmetry not able to test due to ET tube.  Tongue protrusion not cooperative. Spontaneous movement of right hand and foot, but only mild withdraw with pain stimulation for proximal right UE and LE, mild withdraw of LLE on pain, but no movement of LUE. DTR 1+ and no babinski. Sensation, coordination and gait not tested.   ASSESSMENT/PLAN [name] is a 33 y.o. male with history of hypertension, end-stage renal disease  on ASA+Plavix for recent stroke  presents to the emergency department obtunded with left-sided plegia and right gaze deviation. CT head showed 60 cc hematoma in the right basal ganglia with intraventricular extension, hydrocephalus, and 1 cm of midline shift. Patient was emergency intubated mainly for airway protection. EVD was emergently placed at bedside by [place] for hydrocephalus. Patient also was given desmopressin given the fact that he is using Plavix with small studies supporting its use in the setting of antiplatelets diminishing incidence of hemorrhage expansion.   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 Mass effect. 72mm L midline shift. IVH increased in R lateral ventricle atrium. R EVD  Repeat CT head - 10/13/19 - unchanged R basal ganglia ICH and IVH. Improved ventriculomegaly. Stable 1cm midline shift.  [facility] 10/15/19 - Unchanged size of large intraparenchymal hematoma centered in the right basal ganglia with intraventricular extension.Decreased size of the lateral ventricles with unchanged 6 mm of leftward midline shift.  Repeat CT head 11/3 no significant change in hemorrhage, 54mm midline shift. EVD w/ stable ventricle size. Slight decreased cortical sulcation suggesting worsening edema.  2D Echo - EF >65%. No source of embolus   EEG no sz.  Sars Corona Virus 2  neg  LDL - 158 in July  HgbA1c 4.9 in July    UDS neg  VTE prophylaxis - SCDs only d/t bleed  ASA + Plavix prior to admission, now not indicated  Therapy recommendations:  Pending (need to reorder when ready)  Disposition:  Pending  Wife very hopeful  Cerebral Edema Obstructive hydrocephalus  EVD placed in ED (Ostergard)  Na 142-145-147->152->149->154->156->160->156->145->147->149  Goal Na 150-155  On 3% @  50cc/hr  NSG clamped EVD 11/2 - worsening neuro exam - opened  Re-attempt clamp trial 11/4  Monitor   Acute Hypoxemic Respiratory Failure  Intubated  Off sedation  Unable to extubate d/t mental status  CCM on board  Continue weaning trials - on weaning 6hrs yesterday  Fever   Temp 100.7->100->afebrile->100.4->101.1->afebrile  CXR - haze right LL, pneumonia vs. Atelectasis  UA showed WBC > 50  Urine Cx - difficult collection for pt ESRD   On rocephine 10/31>>  Tylenol PRN  Hypertensive Emergency  BP as high as 194/105 on arrival  Home BP meds: norvasc 10, coreg 25, hydralazine 100 tid, sponolactone 25, minoxidil 5 bid  Now on norvasc 10, coreg 25, hydralazine 100 tid, sponolactone 25, increased minoxidil to 10mg  bid and add clonidine 0.1 tid . Goal SBP <160  . Off cleviprex now . Nephrology on board . Long-term BP goal normotensive  Hx stroke/TIA  06/2019 admitted for confusion and hypertensive encephalopathy.  MRI showed numerous bilateral anterior and posterior circulation infarcts throughout.  MRA head and neck unremarkable.  EF > 65%.  LDL 158, A1c 4.9.  UDS positive for cocaine and THC.   Discharged on DAPT  ESRD on HD   MWF schedule  Renal following  Last HD yesterday  Anemia d/t CKD, hx duodenitis  Hb 7.1->8.0->7.7->7.4->6.4->6.9->8.3->8.8->7.2  Received PRBC transfusion x 2  EGD in Sept w/ reactive gastropathy and peptic duodenitis  Stool occult blood +  On PPI gtt  GI consult recommended maximizing medical therapy, plan to repeat EGD in pm, transfuse as indicated  CBC monitoring  Hyperlipidemia  Home Lipid lowering medication: none   LDL 158  in July  Hold statin given hemorrhage   Consider statin on discharge  Dysphagia Malnutrition, moderate   . Secondary to stroke . NPO . Has OG . On TF @50   - now on hold due to plan for EGD this pm  Tobacco abuse  Current smoker  Smoking cessation counseling will be provided    Other Stroke Risk Factors  ETOH abuse; wife reports he quit in July  Hx polysubstance abuse, hx cocaine abuse. UDS neg this admission  Other Active Problems  R index finger edema - ?? Chronic cyst   Hospital day # 6  This patient is critically ill and at significant risk of neurological worsening, death and care requires constant monitoring of vital signs, hemodynamics,respiratory and cardiac monitoring, extensive review of multiple databases, frequent neurological assessment, discussion with family, other specialists and medical decision making of high complexity. I spent 40 minutes of neurocritical care time  in the care of  this patient. I discussed with GI service and CCM NP Shirlee Limerick.  Rosalin Hawking, MD PhD Stroke Neurology 10/15/2019 8:40 AM    To contact Stroke Continuity provider, please refer to [email or web address] After hours, contact General Neurology

## 2019-10-17 NOTE — Progress Notes (Signed)
Attempted EGD on William [name] this afternoon. Had to use a pediatric biteblock due to mouth size. Not enough room in his orophraynx around his large tongue to advance the gastroscope (I couldn't even fit my 5th finger in his mouth with the bite block present). He had some tongue bleeding related to trauma from my attempt.  He received 2mg  of Versed and 25 mcg of Fentyl prior to the planned procedure.  Unfortunately, we have limited endoscopic options at this time. We could consider repeating endoscopy with a neonatal scope tomorrow if the bleeding continues. The down side is that neonatal scope will be too small for most therapeutic interventions. Would get IR involved if there is hemodynamic instability overnight. Will plan maximum medical therapy in the meantime, continuing IV Protonix and serial hgb/hct with transfusion as indicated.

## 2019-10-17 NOTE — Op Note (Signed)
[facility] Patient Name: William [name] Procedure Date : 10/18/2019 MRN: [id number] Attending MD: Thornton Park MD, MD Date of Birth: [birth year] CSN: [id number] Age: 33 Admit Type: Inpatient Procedure:                Upper GI endoscopy Indications:              Recent gastrointestinal bleeding, suspected upper                            GI source Providers:                Thornton Park MD, MD, Ashley Jacobs, RN, Glori Bickers, RN, Elspeth Cho Tech., Technician Referring MD:              Medicines:                Fentanyl 25 micrograms IV, Midazolam 2 mg IV Complications:            No immediate complications. Estimated blood loss:                            Minimal. Estimated Blood Loss:     Estimated blood loss was minimal. Procedure:                Pre-Anesthesia Assessment:                           - Prior to the procedure, a History and Physical                            was performed, and patient medications and                            allergies were reviewed. The patient's tolerance of                            previous anesthesia was also reviewed. The risks                            and benefits of the procedure and the sedation                            options and risks were discussed with the patient.                            All questions were answered, and informed consent                            was obtained. Prior Anticoagulants: The patient has                            taken heparin. ASA Grade Assessment: IV - A patient  with severe systemic disease that is a constant                            threat to life. After reviewing the risks and                            benefits, the patient was deemed in satisfactory                            condition to undergo the procedure.                           I obtained informed consent from the patient's wife     earlier today. A pediatric bite block was required                            because an adult bite block would not fit in his                            mouth. The endoscope was passed under direct                            vision. Throughout the procedure, the patient's                            blood pressure, pulse, and oxygen saturations were                            monitored continuously. The procedure was aborted.                            The scope was inserted into the oropharynx but                            could not advance beyond the tongue. Medications                            were given. The procedure was aborted due to                            unusual anatomy. Scope In: Scope Out: Findings:      I was unable to advance the gastroscope (or my fifth digit) over his       large tongue to access the hypopharynx when the bite block was present.       Local oozing from scope trauma occurred on the most distal aspect of his       tongue. Estimated blood loss was minimal. Impression:               - The procedure was aborted due to unusual anatomy.                           - No specimens collected. Recommendation:           - Continue close observation.                           -  NPO. Would wait to resume enteral feedings until                            there is evidence that his bleeding has stopped.                           - Continue present medications including IV                            Protonix. Avoid anticoagulants as able.                           - Continue serial hgb/hct with transfusion as                            indicated.                           - May be able to perform a diagnostic endoscopy                            using a neonatal gastroscope, but, this scope would                            be too small to allow any therapeutic interventions                            to be performed.                           - Consult IR with any  additional overt bleeding                            with associated hemodynamic instability.                           The results of the procedure and my recommendations                            were discussed with the patient's wife by                            telephone. All questions were answered to her                            satisfaction. Procedure Code(s):         Diagnosis Code(s):        --- Professional ---                           K92.2, Gastrointestinal hemorrhage, unspecified CPT copyright 2019 [employer]. All rights reserved. The codes documented in this report are preliminary and upon coder review may  be revised to meet current compliance requirements. Thornton Park MD, MD 10/29/2019 4:20:41 PM This report has been signed electronically. Number of Addenda: 0

## 2019-10-18 DIAGNOSIS — G911 Obstructive hydrocephalus: Secondary | ICD-10-CM

## 2019-10-18 LAB — CBC
HCT: 24.9 % — ABNORMAL LOW (ref 39.0–52.0)
Hemoglobin: 8 g/dL — ABNORMAL LOW (ref 13.0–17.0)
MCH: 30.4 pg (ref 26.0–34.0)
MCHC: 32.1 g/dL (ref 30.0–36.0)
MCV: 94.7 fL (ref 80.0–100.0)
Platelets: 323 10*3/uL (ref 150–400)
RBC: 2.63 MIL/uL — ABNORMAL LOW (ref 4.22–5.81)
RDW: 16.4 % — ABNORMAL HIGH (ref 11.5–15.5)
WBC: 12.9 10*3/uL — ABNORMAL HIGH (ref 4.0–10.5)
nRBC: 0.8 % — ABNORMAL HIGH (ref 0.0–0.2)

## 2019-10-18 LAB — GLUCOSE, CAPILLARY
Glucose-Capillary: 86 mg/dL (ref 70–99)
Glucose-Capillary: 98 mg/dL (ref 70–99)
Glucose-Capillary: 139 mg/dL — ABNORMAL HIGH (ref 70–99)
Glucose-Capillary: 127 mg/dL — ABNORMAL HIGH (ref 70–99)
Glucose-Capillary: 115 mg/dL — ABNORMAL HIGH (ref 70–99)
Glucose-Capillary: 116 mg/dL — ABNORMAL HIGH (ref 70–99)

## 2019-10-18 LAB — BASIC METABOLIC PANEL
Anion gap: 17 — ABNORMAL HIGH (ref 5–15)
BUN: 101 mg/dL — ABNORMAL HIGH (ref 6–20)
CO2: 20 mmol/L — ABNORMAL LOW (ref 22–32)
Calcium: 8.9 mg/dL (ref 8.9–10.3)
Chloride: 118 mmol/L — ABNORMAL HIGH (ref 98–111)
Creatinine, Ser: 9.06 mg/dL — ABNORMAL HIGH (ref 0.61–1.24)
GFR calc Af Amer: 8 mL/min — ABNORMAL LOW (ref >60)
GFR calc non Af Amer: 7 mL/min — ABNORMAL LOW (ref >60)
Glucose, Bld: 126 mg/dL — ABNORMAL HIGH (ref 70–99)
Potassium: 4.6 mmol/L (ref 3.5–5.1)
Sodium: 155 mmol/L — ABNORMAL HIGH (ref 135–145)

## 2019-10-18 LAB — SODIUM
Sodium: 145 mmol/L (ref 135–145)
Sodium: 148 mmol/L — ABNORMAL HIGH (ref 135–145)
Sodium: 152 mmol/L — ABNORMAL HIGH (ref 135–145)

## 2019-10-18 MED ORDER — CLONIDINE HCL 0.2 MG PO TABS
0.2000 mg | ORAL_TABLET | Freq: Three times a day (TID) | ORAL | Status: DC
  Administered 2019-10-18 – 2019-10-26 (×19): 0.2 mg
  Filled 2019-10-18 (×20): qty 1

## 2019-10-18 MED ORDER — SODIUM CHLORIDE 23.4 % INJECTION (4 MEQ/ML) FOR IV ADMINISTRATION
120.0000 meq | Freq: Once | INTRAVENOUS | Status: AC
  Administered 2019-10-18: 120 meq via INTRAVENOUS
  Filled 2019-10-18: qty 30

## 2019-10-18 NOTE — Progress Notes (Signed)
CRITICAL VALUE ALERT  Critical Value:  145  Date & Time Notied:  [id number]  Provider Notified: Dr. Erlinda Hong  Orders Received/Actions taken: New orders placed. See MAR.

## 2019-10-18 NOTE — Progress Notes (Signed)
NAME:  William [name], MRN:  [id number], DOB:  [birth year], LOS: 7 ADMISSION DATE:  09/19/2019, CONSULTATION DATE:  09/22/2019 REFERRING MD:  Ward  CHIEF COMPLAINT:  AMS.   Brief History    33 y.o. male with PMH of HTN, ESRD and cocaine use with previous stroke on ASA and plavix who woke up 10/28 feeling short of breath and wife noted slurred speech and facial droop.  CT showed large R hemorrhage, pt deteriorated and required intubation.  Pt given DDAVP, neurosurgery and neurology consulted and PCCM consulted.   Past Medical History  CVA 06/2019,HTN,HLD,ESRD iHD TTHS,Cocaine abuse.,Anemia ,Right AV graft placed 08/15/2019  Significant Hospital Events   10/28 > admit, intubated with large R BG ICH; IVD in parenchyma of brain but felt w/ coagulopathy more risk than benefit to re-attempt   10/28: posturing. Still hypertensive. DBP goal <160. Starting on HT saline.   11/1- 1 unit PRBC 11/2- HB drifting down- GI consulted. Om PPI drip. Off sedation on PSV weans 5/5 and following some commands 11/3- Failed EVD clamping due to poor mental status  Consults:  Neurosurgery PCCM. neprhology  GI  Procedures:  ETT 10/28 >> Left IJ CVL 10/28>>>  Significant Diagnostic Tests:  [facility] 10/28 >> right BG ICH with Intraventricular extension and MLS (formal read pending). CT brain 10/28: 1. A 6.8 x 3.6 x 4.9 cm parenchymal hemorrhage centered within the right basal ganglia has not significantly changed in size. Mass effect with partial effacement of ventricular system and unchanged 10 mm leftward midline shift. 2. Redemonstrated intraventricular extension of hemorrhage. Hemorrhage within the right lateral ventricle atrium has increased from prior exam. 3. Interval placement of a right frontal approach ventricular catheter. It is unclear whether the catheter tip terminates within the right basal ganglia or foramen of [place]. However, there are small foci of pneumocephalus within the right frontal horn  CT head  on 10/30- Unchanged right basal ganglia and intraventricular clot. Improved ventriculomegaly. Stable 1 cm leftward midline shift.  CT head 11/1: essentially unchanged-  CT Head 11/2: R basal ganglia hemorrhage with intraventricular extension, stable mass 59mm with R to L midline shift. R frontal EVD tip terminating near R thalamus. Interval worsening of cerebral edema  Micro Data:  SARS CoV2 10/28 >> neg urine culture 10/31-multiple species.  Antimicrobials:  Ceftriaxone 10/31>>  Interim history/subjective:  On full vent support, did tolerate pressure support on 11/3 Off cleviprex Receiving dialysis at present Objective:  Blood pressure (!) 149/78, pulse 98, temperature (!) 101.3 F (38.5 C), temperature source Axillary, resp. rate (!) 33, weight 77.8 kg, SpO2 99 %.    Vent Mode: PRVC FiO2 (%):  [40 %-100 %] 50 % Set Rate:  [18 bmp] 18 bmp Vt Set:  [560 mL] 560 mL PEEP:  [4 cmH20-5 cmH20] 5 cmH20 Pressure Support:  [10 cmH20] 10 cmH20 Plateau Pressure:  [13 cmH20-26 cmH20] 15 cmH20   Intake/Output Summary (Last 24 hours) at 10/18/2019 [id number] Last data filed at 10/18/2019 0800 Gross per 24 hour  Intake 3401.48 ml  Output 234 ml  Net 3167.48 ml   Filed Weights   10/16/19 0500 10/28/2019 0448 10/18/19 0500  Weight: 76.3 kg 79.3 kg 77.8 kg    Examination: Gen: Chronically ill-appearing  HEENT: Right EVD in place, ET tube in place, trachea midline  lungs: Clear breath sounds bilaterally   CV: S1-S2 appreciated Abd: Soft round ndnt. + bowel sounds Ext.  Bilateral upper extremity edema Neuro: Opens eyes spontaneously. Weak but follows commands RUE RLE.  Moved LUE spontaneously, not to command   Assessment & Plan:   Right basal ganglia intracerebral hemorrhage, complicated by intraventricular bleeding History of prior CVA Right EVD in place  SBP goal less than 160, on Cleviprex and labetalol as needed And hypertonic saline with goal sodium of 1 50-1 55 Drain management per  neurosurgery Continue neurochecks   Acute hypoxemic respiratory failure In the setting of basal ganglia intracerebral hemorrhage Continue efforts at weaning Mental status precludes extubation at present Pulmonary hygiene VAP order set in place  End-stage renal disease on hemodialysis Tunneled HD catheter in place, has right AV graft Nephrology following for dialysis  Hypertension Continue clonidine, hydralazine, Coreg as scheduled As needed Cleviprex and labetalol as needed Goal SBP less than 160  History of polysubstance abuse, history of cocaine abuse Supportive measures   Anemia, guaiac positive stools EGD attempted but failed Continue Protonix GTT Appreciate GI assistance Trend CBC and transfuse hemoglobin less than 7  Right index finger edema Monitor X-ray of the hand on 11/10/2019 was unremarkable-soft tissue swelling  Best Practice:  Diet: Continue tube feed Pain/Anxiety/Delirium protocol (if indicated): Fentanyl PRN / Midazolam PRN.  RASS goal 0  VAP protocol (if indicated): In place DVT prophylaxis: SCDs GI prophylaxis: PPI gtt  Glucose control: SSI. Mobility: Bedrest Code Status: Full code Family Communication: Will update Disposition: ICU. Guarded prognosis     The patient is critically ill with multiple organ systems failure and requires high complexity decision making for assessment and support, frequent evaluation and titration of therapies, application of advanced monitoring technologies and extensive interpretation of multiple databases. Critical Care Time devoted to patient care services described in this note independent of APP/resident time (if applicable)  is 32 minutes.   Sherrilyn Rist MD [facility] Personal pager: [phone] If unanswered, please page CCM On-call: [phone]

## 2019-10-18 NOTE — Progress Notes (Signed)
STROKE TEAM PROGRESS NOTE   INTERVAL HISTORY Wife, RN, dialysis RN are at bedside. Pt seems more lethargic today, eyes open but not following commands. Eyes more left gaze today, will do EEG to rule out seizure. No clinical seizure seen. Still on vent, mildly increased WOB, not able to do weaning trial.    OBJECTIVE Vitals:   10/18/19 0845 10/18/19 0900 10/18/19 0915 10/18/19 0930  BP: 126/67 129/68 (!) 145/74 (!) 145/77  Pulse: 93 94 95 95  Resp: (!) 26 19 (!) 24 (!) 25  Temp: 98.8 F (37.1 C) 98.6 F (37 C) 98.4 F (36.9 C) 98.4 F (36.9 C)  TempSrc:      SpO2: 97% 97% 98% 98%  Weight:        CBC:  Recent Labs  Lab 11/07/2019 1936 10/18/19 0340  WBC 10.3 12.9*  HGB 8.0* 8.0*  HCT 24.7* 24.9*  MCV 92.9 94.7  PLT 303 [id number]    Basic Metabolic Panel:  Recent Labs  Lab 10/13/19 0515  10/13/19 1633  10/20/2019 0450  10/23/2019 1743 11/09/2019 1936 10/18/19 0340  NA 145   < >  --    < > 147*   < > 149* 152* 155*  K 4.9  --   --    < > 4.2  --  4.8  --  4.6  CL 107  --   --    < > 105  --   --   --  118*  CO2 25  --   --    < > 24  --   --   --  20*  GLUCOSE 119*  --   --    < > 133*  --   --   --  126*  BUN 26*  --   --    < > 66*  --   --   --  101*  CREATININE 8.97*  --   --    < > 7.34*  --   --   --  9.06*  CALCIUM 8.9  --   --    < > 8.7*  --   --   --  8.9  MG 2.2  --  2.2  --   --   --   --   --   --   PHOS 4.4  --  4.5  --   --   --   --   --   --    < > = values in this interval not displayed.    Lipid Panel:     Component Value Date/Time   CHOL 253 (H) 06/23/2019 0301   TRIG 126 10/22/2019 1729   HDL 67 06/23/2019 0301   CHOLHDL 3.8 06/23/2019 0301   VLDL 28 06/23/2019 0301   LDLCALC 158 (H) 06/23/2019 0301   HgbA1c:  Lab Results  Component Value Date   HGBA1C 4.9 06/23/2019   Urine Drug Screen:     Component Value Date/Time   LABOPIA NONE DETECTED 09/27/2019 0648   COCAINSCRNUR NONE DETECTED 09/23/2019 0648   LABBENZ NONE DETECTED 10/10/2019 0648    AMPHETMU NONE DETECTED 10/12/2019 0648   THCU NONE DETECTED 09/20/2019 0648   LABBARB NONE DETECTED 09/26/2019 0648    Alcohol Level No results found for: ETH  IMAGING Ct Head Wo Contrast 11/02/2019 2653 1. No change in size or appearance of a large intraparenchymal hemorrhage centered in the right basal ganglia with extension into the lateral ventricles. 2. Unchanged  edema and mass effect with approximately 7 mm right-to-left midline shift. 3. Unchanged caliber and configuration of the lateral ventricles with layering internal blood product and a right frontal approach shunt catheter.   Dg Hand 2 View Right 10/27/2019 Soft tissue enlargement of the proximal right second digit without adjacent osseous or joint abnormality. This was present but smaller on the 2016 study and is likely benign with a nonaggressive appearance.     CT Head Wo Contrast 10/16/2019 1. No significant interval change in size and appearance of right basal ganglia hemorrhage with intraventricular extension. Stable regional mass effect with up to 7 mm of localized right-to-left midline shift. 2. Stable right frontal approach ventriculostomy with tip terminating near the right thalamus. Stable ventricular size and morphology. 3. Slightly decreased cortical sulcation throughout the brain as compared to previous, suggesting worsened cerebral edema. 4. No other new acute intracranial abnormality.  CT Head Wo Contrast 10/15/2019 1. Unchanged size of large intraparenchymal hematoma centered in the right basal ganglia with intraventricular extension. 2. Decreased size of the lateral ventricles with unchanged 6 mm of leftward midline shift. 3. No new site of hemorrhage.  Ct Head Wo Contrast 10/13/2019 1. Unchanged right basal ganglia and intraventricular clot.  2. Improved ventriculomegaly.  3. Stable 1 cm leftward midline shift.   Dg Chest Port 1 View 10/13/2019 1. Unremarkable hardware positioning.  2. Improved  aeration but persistent dense opacity behind the heart.   Transthoracic Echocardiogram  1. The left ventricle has hyperdynamic systolic function, with an ejection fraction of >65%. The cavity size was decreased. There is severe concentric left ventricular hypertrophy. Left ventricular diastolic parameters were normal. No evidence of left  ventricular regional wall motion abnormalities.  2. The right ventricle has normal systolic function. The cavity was normal. There is no increase in right ventricular wall thickness. Right ventricular systolic pressure could not be assessed.  3. Small pericardial effusion.  4. No evidence of mitral valve stenosis.  5. The aortic valve is tricuspid. No stenosis of the aortic valve.  EEG 10/15/2019 This is an abnormal electroencephalogram secondary to focal right hemispheric slowing superimposed on a diffusely slow background.  Occasional right frontal sharp waves are noted as well.   These findings are consistent with the patient's history of right ICH with right EVD placement.  No subclinical seizure activity is noted.     PHYSICAL EXAM  Temp:  [97.3 F (36.3 C)-101.3 F (38.5 C)] 98.4 F (36.9 C) (11/04 0930) Pulse Rate:  [68-120] 95 (11/04 0930) Resp:  [14-42] 25 (11/04 0930) BP: (117-216)/(56-152) 145/77 (11/04 0930) SpO2:  [89 %-100 %] 98 % (11/04 0930) FiO2 (%):  [40 %-100 %] 50 % (11/04 0736) Weight:  [77.8 kg] 77.8 kg (11/04 0500)  General - Well nourished, well developed, intubated on low dose sedation. Mild respiratory distress on vent  Ophthalmologic - fundi not visualized due to noncooperation.  Cardiovascular - Regular rate and rhythm.  Neuro - intubated on low dose propofol, spontaneously open eyes but not tracking, not following commands, left gaze today not cross midline. Did not follow simple commands today. Not blinking to visual threat bilaterally today, PERRL. Corneal reflex present, gag and cough present. Breathing over the vent.   Facial symmetry not able to test due to ET tube.  Tongue protrusion not cooperative. Minimally withdraw with pain stimulation in all extremities. DTR 1+ and no babinski. Sensation, coordination and gait not tested.   ASSESSMENT/PLAN [name] is a 33 y.o. male with history  of hypertension, end-stage renal disease on ASA+Plavix for recent stroke presents to the emergency department obtunded with left-sided plegia and right gaze deviation. CT head showed 60 cc hematoma in the right basal ganglia with intraventricular extension, hydrocephalus, and 1 cm of midline shift. Patient was emergency intubated mainly for airway protection. EVD was emergently placed at bedside by [place] for hydrocephalus. Patient also was given desmopressin given the fact that he is using Plavix with small studies supporting its use in the setting of antiplatelets diminishing incidence of hemorrhage expansion.   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 Mass effect. 5mm L midline shift. IVH increased in R lateral ventricle atrium. R EVD  Repeat CT head - 10/13/19 - unchanged R basal ganglia ICH and IVH. Improved ventriculomegaly. Stable 1cm midline shift.  [employer] 10/15/19 - Unchanged size of large intraparenchymal hematoma centered in the right basal ganglia with intraventricular extension.Decreased size of the lateral ventricles with unchanged 6 mm of leftward midline shift.  Repeat CT head 11/2 no significant change in hemorrhage, 77mm midline shift. EVD w/ stable ventricle size. Slight decreased cortical sulcation suggesting worsening edema.  Repeat CT 11/3 no change in hemorrhage or mass effect, 83mm midline shift.EVD. no change in ventricles  2D Echo - EF >65%. No source of embolus   EEG no sz.  Sars Corona Virus 2  neg  LDL  - 158 in July  HgbA1c 4.9 in July    UDS neg  VTE prophylaxis - SCDs only d/t anemia needing transfusion  ASA + Plavix prior to admission, now not indicated  Therapy recommendations:  Pending (need to reorder when ready)  Disposition:  Pending  Wife still very hopeful  Cerebral Edema Obstructive hydrocephalus  EVD placed in ED (Ostergard)  Na 142->147->152->149->154->160->156->145->149->155->145->148  Goal Na 150-155, Na difficult managing due to dialysis  On 3%   NSG clamped EVD 11/2 - worsening neuro exam - opened  Ventricles remain open. NSG does not feel neuro worsening r/t hydrocephalus  Re-attempt clamp trial 11/5  Monitor   Acute Hypoxemic Respiratory Failure  Intubated  On low dose sedation  Unable to extubate d/t mental status  CCM on board  Weaning trials if able  Fever   Temp 100.7->100->afebrile->100.4->101.1->afebrile->101.3  CXR - haze right LL, pneumonia vs. Atelectasis  WBC 10.3->12.9  UA showed WBC > 50  Urine Cx - difficult collection for pt ESRD   On rocephine 10/31>>  Tylenol PRN  CCM on board  Hypertensive Emergency  BP as high as 194/105 on arrival  Home BP meds: norvasc 10, coreg 25, hydralazine 100 tid, sponolactone 25, minoxidil 5 bid  Now on norvasc 10, coreg 25, hydralazine 100 tid, sponolactone 25, increased minoxidil to 10mg  bid and increase clonidine to 0.2 tid . Goal SBP <140  . On low dose cleviprex now . Nephrology on board . Long-term BP goal normotensive  Hx stroke/TIA  06/2019 admitted for confusion and hypertensive encephalopathy.  MRI showed numerous bilateral anterior and posterior circulation infarcts throughout.  MRA head and neck unremarkable.  EF > 65%.  LDL 158, A1c 4.9.  UDS positive for cocaine and THC.   Discharged on DAPT  ESRD on HD   MWF schedule  Renal following  Had HD today  Anemia d/t CKD, hx duodenitis  Hb  7.1->8.0->7.7->7.4->6.4->6.9->8.3->8.8->7.2->9.2->8.0->8.0  Received PRBC transfusion x 2 on 11/2  EGD in Sept w/ reactive gastropathy and peptic duodenitis  Stool occult blood +  On PPI gtt  GI consult recommended maximizing medical therapy, unable to do EGD d/t swollen tongue, transfuse as indicated    CBC monitoring  Hyperlipidemia  Home Lipid lowering medication: none   LDL 158 in July  Hold statin given hemorrhage   Consider statin on discharge  Dysphagia Malnutrition, moderate   . Secondary to stroke . NPO . On TF @50  via OG  Tobacco abuse  Current smoker  Smoking cessation counseling will be provided   Other Stroke Risk Factors  ETOH abuse; wife reports he quit in July  Hx polysubstance abuse, hx cocaine abuse. UDS neg this admission  Other Active Problems  R index finger edema - no fx, chronic worsening soft tissue swelling  Hospital day # 7  This patient is critically ill and at significant risk of neurological worsening, death and care requires constant monitoring of vital signs, hemodynamics,respiratory and cardiac monitoring, extensive review of multiple databases, frequent neurological assessment, discussion with family, other specialists and medical decision making of high complexity. I spent 40 minutes of neurocritical care time  in the care of  this patient. I discussed with GI service and CCM NP Shirlee Limerick. I had long discussion with wife at bedside, updated pt current condition, treatment plan and potential prognosis, and answered all the questions. Wife expressed understanding and appreciation. I also discussed with CCM NP Shirlee Limerick.   Rosalin Hawking, MD PhD Stroke Neurology 10/18/2019 9:55 AM    To contact Stroke Continuity provider, please refer to [email or web address] After hours, contact General Neurology

## 2019-10-18 NOTE — Progress Notes (Signed)
Neurosurgery Service Progress Note  Subjective: NAE ON, exam waxing/waning overnight after sedation for attempted upper GI endoscopy  Objective: Vitals:   10/18/19 0845 10/18/19 0900 10/18/19 0915 10/18/19 0930  BP: 126/67 129/68 (!) 145/74   Pulse: 93 94 95 95  Resp: (!) 26 19 (!) 24 (!) 25  Temp: 98.8 F (37.1 C) 98.6 F (37 C) 98.4 F (36.9 C) 98.4 F (36.9 C)  TempSrc:      SpO2: 97% 97% 98% 98%  Weight:       Temp (24hrs), Avg:98.5 F (36.9 C), Min:97.3 F (36.3 C), Max:101.3 F (38.5 C)  CBC Latest Ref Rng & Units 10/18/2019 11/09/2019 11/10/2019  WBC 4.0 - 10.5 K/uL 12.9(H) 10.3 -  Hemoglobin 13.0 - 17.0 g/dL 8.0(L) 8.0(L) 9.2(L)  Hematocrit 39.0 - 52.0 % 24.9(L) 24.7(L) 27.0(L)  Platelets 150 - 400 K/uL 323 303 -   BMP Latest Ref Rng & Units 10/18/2019 11/11/2019 11/13/2019  Glucose 70 - 99 mg/dL 126(H) - -  BUN 6 - 20 mg/dL 101(H) - -  Creatinine 0.61 - 1.24 mg/dL 9.06(H) - -  BUN/Creat Ratio 9 - 20 - - -  Sodium 135 - 145 mmol/L 155(H) 152(H) 149(H)  Potassium 3.5 - 5.1 mmol/L 4.6 - 4.8  Chloride 98 - 111 mmol/L 118(H) - -  CO2 22 - 32 mmol/L 20(L) - -  Calcium 8.9 - 10.3 mg/dL 8.9 - -    Intake/Output Summary (Last 24 hours) at 10/18/2019 0936 Last data filed at 10/18/2019 0350 Gross per 24 hour  Intake 3481.46 ml  Output 218 ml  Net 3263.46 ml    Current Facility-Administered Medications:  .  0.9 %  sodium chloride infusion (Manually program via Guardrails IV Fluids), , Intravenous, Once, Deterding, Guadelupe Sabin, MD .  0.9 %  sodium chloride infusion, , Intravenous, PRN, Rosalin Hawking, MD, Stopped at 10/16/19 2119 .  acetaminophen (TYLENOL) 160 MG/5ML solution 650 mg, 650 mg, Per Tube, Q4H PRN, Rosalin Hawking, MD, 650 mg at 10/18/19 0747 .  amLODipine (NORVASC) tablet 10 mg, 10 mg, Per Tube, QHS, Biby, Sharon L, NP, 10 mg at 10/15/2019 2133 .  carvedilol (COREG) tablet 25 mg, 25 mg, Per Tube, BID WC, Aroor, Lanice Schwab, MD, 25 mg at 10/18/19 0748 .  cefTRIAXone  (ROCEPHIN) 1 g in sodium chloride 0.9 % 100 mL IVPB, 1 g, Intravenous, Q24H, Bowser, Laurel Dimmer, NP, Stopped at 10/29/2019 1100 .  chlorhexidine gluconate (MEDLINE KIT) (PERIDEX) 0.12 % solution 15 mL, 15 mL, Mouth Rinse, BID, Aroor, Lanice Schwab, MD, 15 mL at 10/18/19 0841 .  Chlorhexidine Gluconate Cloth 2 % PADS 6 each, 6 each, Topical, Q0600, Roney Jaffe, MD, 6 each at 10/16/19 0517 .  Chlorhexidine Gluconate Cloth 2 % PADS 6 each, 6 each, Topical, Q0600, Roney Jaffe, MD, 6 each at 10/18/19 0230 .  clevidipine (CLEVIPREX) infusion 0.5 mg/mL, 0-21 mg/hr, Intravenous, Continuous, Rosalin Hawking, MD, Last Rate: 20 mL/hr at 10/18/19 0927, 10 mg/hr at 10/18/19 0927 .  cloNIDine (CATAPRES) tablet 0.1 mg, 0.1 mg, Per Tube, TID, Rosalin Hawking, MD, 0.1 mg at 10/22/2019 2133 .  [START ON 10/23/2019] Darbepoetin Alfa (ARANESP) injection 200 mcg, 200 mcg, Intravenous, Q Mon-HD, Roney Jaffe, MD .  docusate (COLACE) 50 MG/5ML liquid 100 mg, 100 mg, Per Tube, BID PRN, Merlene Laughter F, NP .  feeding supplement (PRO-STAT SUGAR FREE 64) liquid 30 mL, 30 mL, Per Tube, TID, Mannam, Praveen, MD, 30 mL at 11/01/2019 2133 .  feeding supplement (VITAL 1.5 CAL)  liquid 1,000 mL, 1,000 mL, Per Tube, Continuous, Mannam, Praveen, MD, Last Rate: 50 mL/hr at 10/18/19 0700 .  fentaNYL (SUBLIMAZE) injection 50 mcg, 50 mcg, Intravenous, Once, Deterding, Guadelupe Sabin, MD .  fentaNYL (SUBLIMAZE) injection 50 mcg, 50 mcg, Intravenous, Q15 min PRN, Mannam, Praveen, MD, 50 mcg at 11/09/2019 1023 .  fentaNYL (SUBLIMAZE) injection 50-200 mcg, 50-200 mcg, Intravenous, Q30 min PRN, Mannam, Praveen, MD, 100 mcg at 10/18/19 0758 .  heparin injection 1,000-6,000 Units, 1,000-6,000 Units, Intravenous, PRN, Aroor, Lanice Schwab, MD, 1,000 Units at 10/12/19 2215 .  hydrALAZINE (APRESOLINE) tablet 100 mg, 100 mg, Per Tube, TID WC, Biby, Sharon L, NP, 100 mg at 10/18/19 0747 .  labetalol (NORMODYNE) injection 10-20 mg, 10-20 mg, Intravenous, Q10 min PRN,  Rosalin Hawking, MD, 20 mg at 10/27/2019 1708 .  MEDLINE mouth rinse, 15 mL, Mouth Rinse, 10 times per day, Aroor, Lanice Schwab, MD, 15 mL at 10/18/19 0602 .  midazolam (VERSED) injection 2 mg, 2 mg, Intravenous, Q15 min PRN, Mannam, Praveen, MD, 2 mg at 10/16/19 1643 .  midazolam (VERSED) injection 2 mg, 2 mg, Intravenous, Q2H PRN, Mannam, Praveen, MD, 2 mg at 10/18/19 0208 .  minoxidil (LONITEN) tablet 10 mg, 10 mg, Per Tube, BID, Rosalin Hawking, MD, 10 mg at 11/03/2019 2133 .  pantoprazole (PROTONIX) 80 mg in sodium chloride 0.9 % 250 mL (0.32 mg/mL) infusion, 8 mg/hr, Intravenous, Continuous, Willia Craze, NP, Last Rate: 25 mL/hr at 10/18/19 0927, 8 mg/hr at 10/18/19 0927 .  [START ON 10/20/2019] pantoprazole (PROTONIX) injection 40 mg, 40 mg, Intravenous, Q12H, Willia Craze, NP .  propofol (DIPRIVAN) 1000 MG/100ML infusion, 5-80 mcg/kg/min, Intravenous, Titrated, Bowser, Grace E, NP, Last Rate: 4.76 mL/hr at 10/18/19 0927, 10 mcg/kg/min at 10/18/19 0927 .  senna-docusate (Senokot-S) tablet 1 tablet, 1 tablet, Per Tube, BID, Aroor, Lanice Schwab, MD, 1 tablet at 10/15/2019 2133 .  sodium chloride (hypertonic) 3 % solution, , Intravenous, Continuous, Rosalin Hawking, MD, Last Rate: 50 mL/hr at 10/18/19 0927 .  sodium chloride flush (NS) 0.9 % injection 10-40 mL, 10-40 mL, Intracatheter, Q12H, Aroor, Lanice Schwab, MD, 10 mL at 11/11/2019 2133 .  sodium chloride flush (NS) 0.9 % injection 10-40 mL, 10-40 mL, Intracatheter, PRN, Aroor, Karena Addison R, MD .  spironolactone (ALDACTONE) tablet 25 mg, 25 mg, Per Tube, Daily, Biby, Sharon L, NP, 25 mg at 11/03/2019 6270   Physical Exam: Intubated, L gaze deviation, eyes open to stim, w/d in RLE, RUE not tested w/ painful stim due to current use of fistula for IHD EVD in place, site c/d/i  Assessment & Plan: 33 y.o. man w/ ESRD on ASA/plavix s/p R ICH/IVH s/p R EVD. Rpt CTH w/ malpositioned, but patent EVD. 11/2 failed clamp trial 2/2 exam, 11/3 rpt CTH stable, ventricles  still decompressed, 4th cleared, expected evolution of peri-hematoma-edema, on 3%  -vents still decompressed & EVD functioning, I don't think his change in exam is due to hydrocephalus. We can follow his exam today and potentially perform a clamp trial tomorrow  Judith Part  10/18/19 9:36 AM

## 2019-10-18 NOTE — Progress Notes (Signed)
Progress Note    ASSESSMENT AND PLAN:   38. 33 yo male with HTN, CVA on plavix / asa, ESRD ( recently started dialysis), duodenitis, chronic anemia. Admitted with intracranial hemorrhage.   2. Upper GI bleed.  Known duodenitis with erosions on EGD in mid September 2020. No other pathology found at time of EGD. Dark liquid stool on exam yesterday, we attempted EGD but his tongue was too large to allow passage of endoscope.  Spoke with RN, patient hasn't had any further bleeding today.  -had to be put back on sedation yesterday following EGD. Now off propofol.  -continue PPI gtt protocol.  -TF have been resumed -If bleeds again will consider using a neonatal endoscope but it wouldn't allow for intervention.   3. Acute on chronic anemia. Hgb stable at 8.0 post one [facility] yesterday     OBJECTIVE:     Vital signs in last 24 hours: Temp:  [97 F (36.1 C)-101.3 F (38.5 C)] 99.9 F (37.7 C) (11/04 1115) Pulse Rate:  [68-120] 95 (11/04 1115) Resp:  [14-42] 23 (11/04 1115) BP: (117-216)/(56-152) 150/77 (11/04 1100) SpO2:  [89 %-100 %] 97 % (11/04 1115) FiO2 (%):  [40 %-100 %] 50 % (11/04 1059) Weight:  [77.8 kg] 77.8 kg (11/04 0500) Last BM Date: 10/22/2019 General:    well-developed male in NAD Heart:  Regular rate and rhythm;  No lower extremity edema   Pulm: trach on ventilator. Abdomen:  Soft, mildly distended  Normal bowel sounds.            Intake/Output from previous day: 11/03 0701 - 11/04 0700 In: 3354.3 [I.V.:1804.4; NG/GT:1450; IV Piggyback:100] Out: 229 [Drains:229] Intake/Output this shift: Total I/O In: 434.1 [I.V.:434.1] Out: 1925 [Drains:25; Other:1900]  Lab Results: Recent Labs    11/06/2019 1726 11/04/2019 1743 11/03/2019 1936 10/18/19 0340  WBC 13.4*  --  10.3 12.9*  HGB 8.4* 9.2* 8.0* 8.0*  HCT 26.4* 27.0* 24.7* 24.9*  PLT 353  --  303 323   BMET Recent Labs    10/16/19 0540  11/12/2019 0450  10/20/2019 1743 11/09/2019 1936 10/18/19 0340 10/18/19  0940  NA 160*   < > 147*   < > 149* 152* 155* 145  K 5.0  --  4.2  --  4.8  --  4.6  --   CL 126*  --  105  --   --   --  118*  --   CO2 21*  --  24  --   --   --  20*  --   GLUCOSE 111*  --  133*  --   --   --  126*  --   BUN 85*  --  66*  --   --   --  101*  --   CREATININE 10.36*  --  7.34*  --   --   --  9.06*  --   CALCIUM 9.1  --  8.7*  --   --   --  8.9  --    < > = values in this interval not displayed.    Ct Head Wo Contrast  Result Date: 10/27/2019 CLINICAL DATA:  Follow-up intracranial hemorrhage EXAM: CT HEAD WITHOUT CONTRAST TECHNIQUE: Contiguous axial images were obtained from the base of the skull through the vertex without intravenous contrast. COMPARISON:  10/16/2019, 10/15/2019, 10/13/2019 FINDINGS: Brain: No change in size or appearance of a large intraparenchymal hemorrhage centered in the right basal ganglia with extension into the  lateral ventricles. Unchanged edema and mass effect with approximately 7 mm right-to-left midline shift. Unchanged caliber and configuration of the lateral ventricles with layering internal blood product and a right frontal approach shunt catheter. Vascular: No hyperdense vessel or unexpected calcification. Skull: Normal. Negative for fracture or focal lesion. Sinuses/Orbits: No acute finding. Other: None. IMPRESSION: 1. No change in size or appearance of a large intraparenchymal hemorrhage centered in the right basal ganglia with extension into the lateral ventricles. 2. Unchanged edema and mass effect with approximately 7 mm right-to-left midline shift. 3. Unchanged caliber and configuration of the lateral ventricles with layering internal blood product and a right frontal approach shunt catheter. Electronically Signed   By: Eddie Candle M.D.   On: 10/31/2019 16:53       LOS: 7 days   Tye Savoy ,NP 10/18/2019, 12:03 PM

## 2019-10-18 NOTE — Progress Notes (Signed)
SLP Cancellation Note  Patient Details Name: William [name] MRN: [id number] DOB: 22-May-1986   Cancelled treatment:        Pt on ventilator; unable to initiate speech-language-cognitive assessment. Will follow.    Houston Siren 10/18/2019, 9:15 AM  Orbie Pyo Colvin Caroli.Ed Risk analyst [phone] Office [phone]

## 2019-10-18 NOTE — Procedures (Signed)
I was present at this dialysis session. I have reviewed the session itself and made appropriate changes.   Vital signs in last 24 hours:  Temp:  [97.3 F (36.3 C)-101.3 F (38.5 C)] 98.8 F (37.1 C) (11/04 0845) Pulse Rate:  [68-120] 93 (11/04 0845) Resp:  [14-42] 26 (11/04 0845) BP: (117-216)/(56-152) 126/67 (11/04 0845) SpO2:  [89 %-100 %] 97 % (11/04 0845) FiO2 (%):  [40 %-100 %] 50 % (11/04 0736) Weight:  [77.8 kg] 77.8 kg (11/04 0500) Weight change: -1.5 kg Filed Weights   10/16/19 0500 11/09/2019 0448 10/18/19 0500  Weight: 76.3 kg 79.3 kg 77.8 kg    Recent Labs  Lab 10/13/19 1633  10/18/19 0340  NA  --    < > 155*  K  --    < > 4.6  CL  --    < > 118*  CO2  --    < > 20*  GLUCOSE  --    < > 126*  BUN  --    < > 101*  CREATININE  --    < > 9.06*  CALCIUM  --    < > 8.9  PHOS 4.5  --   --    < > = values in this interval not displayed.    Recent Labs  Lab 11/02/2019 1726 10/29/2019 1743 10/27/2019 1936 10/18/19 0340  WBC 13.4*  --  10.3 12.9*  HGB 8.4* 9.2* 8.0* 8.0*  HCT 26.4* 27.0* 24.7* 24.9*  MCV 92.6  --  92.9 94.7  PLT 353  --  303 323    Scheduled Meds: . sodium chloride   Intravenous Once  . amLODipine  10 mg Per Tube QHS  . carvedilol  25 mg Per Tube BID WC  . chlorhexidine gluconate (MEDLINE KIT)  15 mL Mouth Rinse BID  . Chlorhexidine Gluconate Cloth  6 each Topical Q0600  . Chlorhexidine Gluconate Cloth  6 each Topical Q0600  . cloNIDine  0.1 mg Per Tube TID  . [START ON 10/23/2019] darbepoetin (ARANESP) injection - DIALYSIS  200 mcg Intravenous Q Mon-HD  . feeding supplement (PRO-STAT SUGAR FREE 64)  30 mL Per Tube TID  . fentaNYL (SUBLIMAZE) injection  50 mcg Intravenous Once  . hydrALAZINE  100 mg Per Tube TID WC  . mouth rinse  15 mL Mouth Rinse 10 times per day  . minoxidil  10 mg Per Tube BID  . [START ON 10/20/2019] pantoprazole  40 mg Intravenous Q12H  . senna-docusate  1 tablet Per Tube BID  . sodium chloride flush  10-40 mL  Intracatheter Q12H  . spironolactone  25 mg Per Tube Daily   Continuous Infusions: . sodium chloride Stopped (10/16/19 2119)  . cefTRIAXone (ROCEPHIN)  IV Stopped (11/05/2019 1100)  . clevidipine 14 mg/hr (10/18/19 0840)  . feeding supplement (VITAL 1.5 CAL) 50 mL/hr at 10/18/19 0700  . pantoprozole (PROTONIX) infusion 8 mg/hr (10/18/19 0800)  . propofol (DIPRIVAN) infusion 15 mcg/kg/min (10/18/19 0800)  . sodium chloride (hypertonic) 50 mL/hr at 10/18/19 0800   PRN Meds:.sodium chloride, acetaminophen (TYLENOL) oral liquid 160 mg/5 mL, docusate, fentaNYL (SUBLIMAZE) injection, fentaNYL (SUBLIMAZE) injection, heparin, labetalol, midazolam, midazolam, sodium chloride flush    Dialysis Orders: [place] MWF 4h 11mn 73.5kg 2.2 bath RU AVG (placed 08/15/19) / R TDCHep none - darbe 200 /wk - hect 3 - venofer 100 x 10 , completed  Assessment/Plan: 1. Acute intracranial bleed- w/ midline shift and IVH. s/p EVD. Has been3% salineday #6 of total  approx 7d per neurology, goal Na+ 150-155. Held yesterday d/t hypernatremia. Per primary/neuro. 2. ESRD- usual HD MWF. AVG placed 08/15/19, have been using x 2 wks at OP unit per pt's wife, will continue to use here.Consult [facility] for TDC removal once stable.Recent start in Sept 2020.Have been usinghigherNa+ dialysate levelsto help ^Na+ level. Goal Na+ 150-155.Used 148 flat on Sat, used 145on Monday.  3. Anemia ckd- Hbdown today 6.4> 7.0 > 6.9>>8.8>7.2. s/p 2units pRBC on 11/2, Hgb increased to 8.8 and then dropped back to 7.2 this AM. FOBT +. GI has been consulted but unable to perform EGD due to swollen tongue.Continuearanesp 200 mcg q Monday.Transfuse prn.  4. HTN/volume - BPin goal this AM, minoxidil increased to 70m BID yesterday. Continue cleviprex prn andhis 4 home BP meds per NG,goalSBP <160.Weights up again this AM, UF goal 2.5-3.5L with HD tomorrow. 5. H/o CVA - July 2020 6. UGIB - Dark liquid stool w/o significant  response to transfusion. EGD in 08/2019 w/duodenitis w/erosions.  Unable to perform EGD due to swollen tongue.  JDonetta Potts  MD 10/18/2019, 8:56 AM

## 2019-10-19 ENCOUNTER — Inpatient Hospital Stay (HOSPITAL_COMMUNITY): Payer: Medicaid Other

## 2019-10-19 LAB — RENAL FUNCTION PANEL
Albumin: 2.5 g/dL — ABNORMAL LOW (ref 3.5–5.0)
Anion gap: 13 (ref 5–15)
BUN: 86 mg/dL — ABNORMAL HIGH (ref 6–20)
CO2: 23 mmol/L (ref 22–32)
Calcium: 9.2 mg/dL (ref 8.9–10.3)
Chloride: 117 mmol/L — ABNORMAL HIGH (ref 98–111)
Creatinine, Ser: 8.18 mg/dL — ABNORMAL HIGH (ref 0.61–1.24)
GFR calc Af Amer: 9 mL/min — ABNORMAL LOW (ref >60)
GFR calc non Af Amer: 8 mL/min — ABNORMAL LOW (ref >60)
Glucose, Bld: 128 mg/dL — ABNORMAL HIGH (ref 70–99)
Phosphorus: 5.3 mg/dL — ABNORMAL HIGH (ref 2.5–4.6)
Potassium: 5 mmol/L (ref 3.5–5.1)
Sodium: 153 mmol/L — ABNORMAL HIGH (ref 135–145)

## 2019-10-19 LAB — GLUCOSE, CAPILLARY
Glucose-Capillary: 105 mg/dL — ABNORMAL HIGH (ref 70–99)
Glucose-Capillary: 117 mg/dL — ABNORMAL HIGH (ref 70–99)
Glucose-Capillary: 123 mg/dL — ABNORMAL HIGH (ref 70–99)
Glucose-Capillary: 128 mg/dL — ABNORMAL HIGH (ref 70–99)
Glucose-Capillary: 135 mg/dL — ABNORMAL HIGH (ref 70–99)
Glucose-Capillary: 136 mg/dL — ABNORMAL HIGH (ref 70–99)

## 2019-10-19 LAB — SODIUM
Sodium: 153 mmol/L — ABNORMAL HIGH (ref 135–145)
Sodium: 153 mmol/L — ABNORMAL HIGH (ref 135–145)
Sodium: 153 mmol/L — ABNORMAL HIGH (ref 135–145)
Sodium: 153 mmol/L — ABNORMAL HIGH (ref 135–145)

## 2019-10-19 MED ORDER — CHLORHEXIDINE GLUCONATE CLOTH 2 % EX PADS
6.0000 | MEDICATED_PAD | Freq: Every day | CUTANEOUS | Status: DC
  Administered 2019-10-19: 6 via TOPICAL

## 2019-10-19 MED ORDER — VITAL 1.5 CAL PO LIQD
1000.0000 mL | ORAL | Status: DC
  Administered 2019-10-19 – 2019-10-23 (×5): 1000 mL
  Filled 2019-10-19 (×7): qty 1000

## 2019-10-19 NOTE — Progress Notes (Signed)
STROKE TEAM PROGRESS NOTE   INTERVAL HISTORY RN at bedside. Pt no significant neuro change from yesterday. Still has eye open spontaneously but not following commands. Eyes middle position now, EEG no seizure. Na 153 and BP under control, off cleviprex. Still has fever, finished rocephin course.   OBJECTIVE Vitals:   10/19/19 0600 10/19/19 0700 10/19/19 0800 10/19/19 0809  BP: 116/65 128/73 133/81 133/81  Pulse: 82 86 90 88  Resp: (!) 23 20 (!) 22 (!) 24  Temp: 98.4 F (36.9 C) 98.4 F (36.9 C) 98.8 F (37.1 C)   TempSrc:      SpO2: 100% 100% 100% 100%  Weight:        CBC:  Recent Labs  Lab 11/07/2019 1936 10/18/19 0340  WBC 10.3 12.9*  HGB 8.0* 8.0*  HCT 24.7* 24.9*  MCV 92.9 94.7  PLT 303 [id number]    Basic Metabolic Panel:  Recent Labs  Lab 10/13/19 0515  10/13/19 1633  10/24/2019 0450  11/01/2019 1743  10/18/19 0340  10/18/19 2129 10/19/19 0340  NA 145   < >  --    < > 147*   < > 149*   < > 155*   < > 152* 153*  K 4.9  --   --    < > 4.2  --  4.8  --  4.6  --   --   --   CL 107  --   --    < > 105  --   --   --  118*  --   --   --   CO2 25  --   --    < > 24  --   --   --  20*  --   --   --   GLUCOSE 119*  --   --    < > 133*  --   --   --  126*  --   --   --   BUN 26*  --   --    < > 66*  --   --   --  101*  --   --   --   CREATININE 8.97*  --   --    < > 7.34*  --   --   --  9.06*  --   --   --   CALCIUM 8.9  --   --    < > 8.7*  --   --   --  8.9  --   --   --   MG 2.2  --  2.2  --   --   --   --   --   --   --   --   --   PHOS 4.4  --  4.5  --   --   --   --   --   --   --   --   --    < > = values in this interval not displayed.    Lipid Panel:     Component Value Date/Time   CHOL 253 (H) 06/23/2019 0301   TRIG 126 11/06/2019 1729   HDL 67 06/23/2019 0301   CHOLHDL 3.8 06/23/2019 0301   VLDL 28 06/23/2019 0301   LDLCALC 158 (H) 06/23/2019 0301   HgbA1c:  Lab Results  Component Value Date   HGBA1C 4.9 06/23/2019   Urine Drug Screen:     Component  Value Date/Time   LABOPIA NONE  DETECTED 09/21/2019 0648   COCAINSCRNUR NONE DETECTED 09/25/2019 0648   LABBENZ NONE DETECTED 10/01/2019 0648   AMPHETMU NONE DETECTED 09/21/2019 0648   THCU NONE DETECTED 10/12/2019 0648   LABBARB NONE DETECTED 10/13/2019 0648    Alcohol Level No results found for: ETH  IMAGING   Ct Head Wo Contrast 10/27/2019 2653 1. No change in size or appearance of a large intraparenchymal hemorrhage centered in the right basal ganglia with extension into the lateral ventricles. 2. Unchanged edema and mass effect with approximately 7 mm right-to-left midline shift. 3. Unchanged caliber and configuration of the lateral ventricles with layering internal blood product and a right frontal approach shunt catheter.   Dg Hand 2 View Right 11/09/2019 Soft tissue enlargement of the proximal right second digit without adjacent osseous or joint abnormality. This was present but smaller on the 2016 study and is likely benign with a nonaggressive appearance.     CT Head Wo Contrast 10/16/2019 1. No significant interval change in size and appearance of right basal ganglia hemorrhage with intraventricular extension. Stable regional mass effect with up to 7 mm of localized right-to-left midline shift. 2. Stable right frontal approach ventriculostomy with tip terminating near the right thalamus. Stable ventricular size and morphology. 3. Slightly decreased cortical sulcation throughout the brain as compared to previous, suggesting worsened cerebral edema. 4. No other new acute intracranial abnormality.  CT Head Wo Contrast 10/15/2019 1. Unchanged size of large intraparenchymal hematoma centered in the right basal ganglia with intraventricular extension. 2. Decreased size of the lateral ventricles with unchanged 6 mm of leftward midline shift. 3. No new site of hemorrhage.  Ct Head Wo Contrast 10/13/2019 1. Unchanged right basal ganglia and intraventricular clot.  2. Improved  ventriculomegaly.  3. Stable 1 cm leftward midline shift.   Dg Chest Port 1 View 10/13/2019 1. Unremarkable hardware positioning.  2. Improved aeration but persistent dense opacity behind the heart.   Transthoracic Echocardiogram  1. The left ventricle has hyperdynamic systolic function, with an ejection fraction of >65%. The cavity size was decreased. There is severe concentric left ventricular hypertrophy. Left ventricular diastolic parameters were normal. No evidence of left  ventricular regional wall motion abnormalities.  2. The right ventricle has normal systolic function. The cavity was normal. There is no increase in right ventricular wall thickness. Right ventricular systolic pressure could not be assessed.  3. Small pericardial effusion.  4. No evidence of mitral valve stenosis.  5. The aortic valve is tricuspid. No stenosis of the aortic valve.  EEG 10/15/2019 This is an abnormal electroencephalogram secondary to focal right hemispheric slowing superimposed on a diffusely slow background.  Occasional right frontal sharp waves are noted as well.   These findings are consistent with the patient's history of right ICH with right EVD placement.  No subclinical seizure activity is noted.     PHYSICAL EXAM  Temp:  [97 F (36.1 C)-101.5 F (38.6 C)] 98.8 F (37.1 C) (11/05 0800) Pulse Rate:  [81-110] 88 (11/05 0809) Resp:  [17-33] 24 (11/05 0809) BP: (107-168)/(45-90) 133/81 (11/05 0809) SpO2:  [93 %-100 %] 100 % (11/05 0809) FiO2 (%):  [40 %-50 %] 40 % (11/05 0809) Weight:  [78.1 kg] 78.1 kg (11/05 0500)  General - Well nourished, well developed, intubated off sedation.   Ophthalmologic - fundi not visualized due to noncooperation.  Cardiovascular - Regular rate and rhythm.  Neuro - intubated off sedation, spontaneously open eyes but not tracking, not following commands, eyes in mid position.  Not blinking to visual threat bilaterally today, PERRL. Corneal reflex present,  gag and cough present. Breathing over the vent.  Facial symmetry not able to test due to ET tube.  Tongue protrusion not cooperative. Minimally withdraw with pain stimulation in all extremities. DTR 1+ and no babinski. Sensation, coordination and gait not tested.   ASSESSMENT/PLAN [name] is a 33 y.o. male with history of hypertension, end-stage renal disease on ASA+Plavix for recent stroke presents to the emergency department obtunded with left-sided plegia and right gaze deviation. CT head showed 60 cc hematoma in the right basal ganglia with intraventricular extension, hydrocephalus, and 1 cm of midline shift. Patient was emergency intubated mainly for airway protection. EVD was emergently placed at bedside by [place] for hydrocephalus. Patient also was given desmopressin given the fact that he is using Plavix with small studies supporting its use in the setting of antiplatelets diminishing incidence of hemorrhage expansion.   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 Mass effect. 72mm L midline shift. IVH increased in R lateral ventricle atrium. R EVD  Repeat CT head - 10/13/19 - unchanged R basal ganglia ICH and IVH. Improved ventriculomegaly. Stable 1cm midline shift.  [facility] 10/15/19 - Unchanged size of large intraparenchymal hematoma centered in the right basal ganglia with intraventricular extension.Decreased size of the lateral ventricles with unchanged 6 mm of leftward midline shift.  Repeat CT head 11/2 no significant change in hemorrhage, 2mm midline shift. EVD w/ stable ventricle size. Slight decreased cortical sulcation suggesting worsening edema.  Repeat CT 11/3 no change in hemorrhage or mass effect, 59mm midline shift.EVD. no change in ventricles  2D Echo - EF >65%. No source of embolus    EEG no sz.  [place] Corona Virus 2  neg  LDL - 158 in July  HgbA1c 4.9 in July    UDS neg  VTE prophylaxis - SCDs only d/t anemia needing transfusion  ASA + Plavix prior to admission, now not indicated  Therapy recommendations:  Pending (need to reorder when ready)  Disposition:  Pending  Cerebral Edema Obstructive hydrocephalus  EVD placed in ED (Ostergard)  Na 142->147->152->149->154->160->156->145->149->155->145->148->153  Goal Na 150-155  On 3%   NSG clamped EVD 11/2 - worsening neuro exam - opened  Ventricles remain open. NSG does not feel neuro worsening r/t hydrocephalus  EVD clamp again 11/5  Repeat CT in am  Monitor   Acute Hypoxemic Respiratory Failure  Intubated  On low dose sedation  Unable to extubate d/t mental status  CCM on board  Weaning trials if able  Fever   Temp 100.7->100->afebrile->100.4->101.1->afebrile->101.3->101.5  CXR - pending  WBC 10.3->12.9  UA showed WBC > 50  Urine Cx - difficult collection for pt ESRD   On rocephine 10/31>>11/4  Tylenol PRN  Blood culture pending  CCM on board  Hypertensive Emergency  BP as high as 194/105 on arrival  Home BP meds: norvasc 10, coreg 25, hydralazine 100 tid, sponolactone 25, minoxidil 5 bid  Now on norvasc 10, coreg 25, hydralazine 100 tid, sponolactone 25, increased minoxidil to 10mg  bid and increase clonidine to 0.2 tid . Goal SBP <140  . Off cleviprex now . Nephrology on board . Long-term BP goal normotensive  Hx stroke/TIA  06/2019 admitted for confusion and hypertensive encephalopathy.  MRI showed  numerous bilateral anterior and posterior circulation infarcts throughout.  MRA head and neck unremarkable.  EF > 65%.  LDL 158, A1c 4.9.  UDS positive for cocaine and THC.   Discharged on DAPT  ESRD on HD   MWF schedule  Renal following  Had HD 10/18/19  Anemia d/t CKD, hx duodenitis  Hb 7.1->8.0->7.7->7.4->6.4->6.9->8.3->8.8->7.2->9.2->8.0->8.0  Received  PRBC transfusion x 2 on 11/2  EGD in Sept w/ reactive gastropathy and peptic duodenitis  Stool occult blood +  On PPI gtt  GI consult recommended maximizing medical therapy, unable to do EGD d/t swollen tongue, transfuse as indicated    CBC monitoring  Hyperlipidemia  Home Lipid lowering medication: none   LDL 158 in July  Hold statin given hemorrhage   Consider statin on discharge  Dysphagia Malnutrition, moderate   . Secondary to stroke . NPO . On TF @50  via OG  Tobacco abuse  Current smoker  Smoking cessation counseling will be provided   Other Stroke Risk Factors  ETOH abuse; wife reports he quit in July  Hx polysubstance abuse, hx cocaine abuse. UDS neg this admission  Other Active Problems  R index finger edema - no fx, chronic worsening soft tissue swelling  Hospital day # 8  This patient is critically ill and at significant risk of neurological worsening, death and care requires constant monitoring of vital signs, hemodynamics,respiratory and cardiac monitoring, extensive review of multiple databases, frequent neurological assessment, discussion with family, other specialists and medical decision making of high complexity. I spent 35 minutes of neurocritical care time  in the care of  this patient.   Rosalin Hawking, MD PhD Stroke Neurology 10/19/2019 8:59 AM   To contact Stroke Continuity provider, please refer to [email or web address] After hours, contact General Neurology

## 2019-10-19 NOTE — Progress Notes (Signed)
Paged on call [place] provider regarding patient's inability to follow commands.  Also the patient is only showing a flicker of movement on the UE's and no movement on the LE's.  Provider came in and physically assessed the patient.  Orders given to closely observe patient and to continue with the am CT scan as scheduled.

## 2019-10-19 NOTE — Progress Notes (Signed)
Nutrition Follow-up  DOCUMENTATION CODES:   Non-severe (moderate) malnutrition in context of chronic illness  INTERVENTION:   Increase Vital 1.5 to 55 ml/hr (1320 ml/day) via OG tube Continue 30 ml Prostat TID  Provides: 2280 kcal, 134 grams protein, and 1008 ml free water.    NUTRITION DIAGNOSIS:   Moderate Malnutrition related to chronic illness(ESRD on HD) as evidenced by moderate fat depletion, mild muscle depletion, moderate muscle depletion, percent weight loss(13.6% weight loss in 3 months).  Ongoing.   GOAL:   Patient will meet greater than or equal to 90% of their needs  Progressing.   MONITOR:   Vent status, Labs, Weight trends, TF tolerance, Skin, I & O's  REASON FOR ASSESSMENT:   Ventilator, Consult Enteral/tube feeding initiation and management  ASSESSMENT:   William [name] who presented to the ED on 10/28 with sudden onset facial droop, slurred speech, left sided weakness. PMH of HTN, ESRD on HD, HLD, prior cocaine abuse. Pt found to have [place] with IVH, hydrocephalus and intubated for airway protection.  11/3 failed EVD clamping due to poor mental status   Patient is currently intubated on ventilator support MV: 13.4 L/min Temp (24hrs), Avg:100.2 F (37.9 C), Min:98.4 F (36.9 C), Max:101.5 F (38.6 C)  Propofol:off  Cleviprex: off  Medications reviewed and include: senokot-s Labs reviewed: Na 155, hemoglobin: 6.9 (L) EVD: 192 ml last 24 hrs   TF: Vital 1.5 @ 50 ml/hr (1200 ml/day) via OG tube with 30 ml Prostat TID Provides: 2100 kcal, 126 grams protein,  Diet Order:   Diet Order            Diet NPO time specified  Diet effective now              EDUCATION NEEDS:   No education needs have been identified at this time  Skin:  Skin Assessment: Reviewed RN Assessment  Last BM:  11/3  Height:   Ht Readings from Last 1 Encounters:  09/21/19 5\' 9"  (1.753 m)    Weight:   Wt Readings from Last 1 Encounters:  10/19/19 78.1 kg     Ideal Body Weight:  72.7 kg  BMI:  Body mass index is 25.43 kg/m.  Estimated Nutritional Needs:   Kcal:  2290  Protein:  110-130 grams  Fluid:  >/= 1.8 L  Maylon Peppers RD, LDN, CNSC [phone] Pager [phone] After Hours Pager

## 2019-10-19 NOTE — Progress Notes (Signed)
Patient ID: William [name], male   DOB: Jan 27, 1986, 33 y.o.   MRN: [id number] S: Per PCCM family wishes to be aggressive for now.  EEG with severe, diffuse encephalopathy O:BP (!) 109/56   Pulse 87   Temp 99 F (37.2 C)   Resp (!) 24   Wt 78.1 kg   SpO2 100%   BMI 25.43 kg/m   Intake/Output Summary (Last 24 hours) at 10/19/2019 1109 Last data filed at 10/19/2019 1100 Gross per 24 hour  Intake 3412.05 ml  Output 175 ml  Net 3237.05 ml   Intake/Output: I/O last 3 completed shifts: In: 5064.6 [I.V.:3134.4; NG/GT:1800; IV Piggyback:130.2] Out: 2222 [Drains:322; Other:1900]  Intake/Output this shift:  Total I/O In: 494.2 [I.V.:294.2; NG/GT:200] Out: 8 [Drains:8] Weight change: 0.3 kg Gen: intubated/sedated CVS: no rub Resp: occ rhonchi Abd: +BS, soft, NT Ext: 1+ edema of hands, LAVF +T/B, RIJ TDC  Recent Labs  Lab 10/12/19 1610  10/13/19 0515  10/13/19 1633  10/14/19 0430  10/15/19 0538 10/15/19 1203  10/16/19 0540  10/20/2019 0450  10/27/2019 1743 10/24/2019 1936 10/18/19 0340 10/18/19 0940 10/18/19 1540 10/18/19 2129 10/19/19 0340 10/19/19 0925  NA 143   < > 145   < >  --    < > 152*   < > 154* 156*   < > 160*   < > 147*   < > 149* 152* 155* 145 148* 152* 153* 153*  K  --    < > 4.9  --   --   --  4.5  --  4.5 4.8  --  5.0  --  4.2  --  4.8  --  4.6  --   --   --   --   --   CL  --   --  107  --   --   --  116*  --  115* 121*  --  126*  --  105  --   --   --  118*  --   --   --   --   --   CO2  --   --  25  --   --   --  20*  --  23 22  --  21*  --  24  --   --   --  20*  --   --   --   --   --   GLUCOSE  --   --  119*  --   --   --  112*  --  110* 107*  --  111*  --  133*  --   --   --  126*  --   --   --   --   --   BUN  --   --  26*  --   --   --  56*  --  49* 57*  --  85*  --  66*  --   --   --  101*  --   --   --   --   --   CREATININE  --   --  8.97*  --   --   --  10.99*  --  7.95* 8.58*  --  10.36*  --  7.34*  --   --   --  9.06*  --   --   --   --   --    ALBUMIN  --   --  3.4*  --   --   --   --   --   --   --   --   --   --   --   --   --   --   --   --   --   --   --   --  CALCIUM  --   --  8.9  --   --   --  8.9  --  8.8* 8.6*  --  9.1  --  8.7*  --   --   --  8.9  --   --   --   --   --   PHOS 7.4*  --  4.4  --  4.5  --   --   --   --   --   --   --   --   --   --   --   --   --   --   --   --   --   --   AST  --   --  33  --   --   --   --   --   --   --   --   --   --   --   --   --   --   --   --   --   --   --   --   ALT  --   --  15  --   --   --   --   --   --   --   --   --   --   --   --   --   --   --   --   --   --   --   --    < > = values in this interval not displayed.   Liver Function Tests: Recent Labs  Lab 10/13/19 0515  AST 33  ALT 15  ALKPHOS 54  BILITOT 0.7  PROT 6.1*  ALBUMIN 3.4*   No results for input(s): LIPASE, AMYLASE in the last 168 hours. No results for input(s): AMMONIA in the last 168 hours. CBC: Recent Labs  Lab 10/16/19 0540  10/15/2019 0450 11/05/2019 1726 10/25/2019 1743 11/07/2019 1936 10/18/19 0340  WBC 10.9*  --  8.7 13.4*  --  10.3 12.9*  HGB 6.9*   < > 7.2* 8.4* 9.2* 8.0* 8.0*  HCT 23.5*   < > 23.2* 26.4* 27.0* 24.7* 24.9*  MCV 98.7  --  93.9 92.6  --  92.9 94.7  PLT 330  --  285 353  --  303 323   < > = values in this interval not displayed.   Cardiac Enzymes: No results for input(s): CKTOTAL, CKMB, CKMBINDEX, TROPONINI in the last 168 hours. CBG: Recent Labs  Lab 10/18/19 1519 10/18/19 1932 10/18/19 2308 10/19/19 0324 10/19/19 0737  GLUCAP 127* 115* 116* 105* 117*    Iron Studies: No results for input(s): IRON, TIBC, TRANSFERRIN, FERRITIN in the last 72 hours. Studies/Results: Ct Head Wo Contrast  Result Date: 10/27/2019 CLINICAL DATA:  Follow-up intracranial hemorrhage EXAM: CT HEAD WITHOUT CONTRAST TECHNIQUE: Contiguous axial images were obtained from the base of the skull through the vertex without intravenous contrast. COMPARISON:  10/16/2019, 10/15/2019, 10/13/2019  FINDINGS: Brain: No change in size or appearance of a large intraparenchymal hemorrhage centered in the right basal ganglia with extension into the lateral ventricles. Unchanged edema and mass effect with approximately 7 mm right-to-left midline shift. Unchanged caliber and configuration of the lateral ventricles with layering internal blood product and a right frontal approach shunt catheter. Vascular: No hyperdense vessel or unexpected calcification. Skull: Normal. Negative for fracture or focal lesion. Sinuses/Orbits: No acute finding. Other: None. IMPRESSION: 1. No change in size or  appearance of a large intraparenchymal hemorrhage centered in the right basal ganglia with extension into the lateral ventricles. 2. Unchanged edema and mass effect with approximately 7 mm right-to-left midline shift. 3. Unchanged caliber and configuration of the lateral ventricles with layering internal blood product and a right frontal approach shunt catheter. Electronically Signed   By: Eddie Candle M.D.   On: 10/24/2019 16:53   . amLODipine  10 mg Per Tube QHS  . carvedilol  25 mg Per Tube BID WC  . chlorhexidine gluconate (MEDLINE KIT)  15 mL Mouth Rinse BID  . Chlorhexidine Gluconate Cloth  6 each Topical Q0600  . Chlorhexidine Gluconate Cloth  6 each Topical Q0600  . cloNIDine  0.2 mg Per Tube TID  . [START ON 10/23/2019] darbepoetin (ARANESP) injection - DIALYSIS  200 mcg Intravenous Q Mon-HD  . feeding supplement (PRO-STAT SUGAR FREE 64)  30 mL Per Tube TID  . hydrALAZINE  100 mg Per Tube TID WC  . mouth rinse  15 mL Mouth Rinse 10 times per day  . minoxidil  10 mg Per Tube BID  . [START ON 10/20/2019] pantoprazole  40 mg Intravenous Q12H  . senna-docusate  1 tablet Per Tube BID  . sodium chloride flush  10-40 mL Intracatheter Q12H  . spironolactone  25 mg Per Tube Daily    BMET    Component Value Date/Time   NA 153 (H) 10/19/2019 0925   NA 141 10/06/2019 0820   K 4.6 10/18/2019 0340   CL 118 (H)  10/18/2019 0340   CO2 20 (L) 10/18/2019 0340   GLUCOSE 126 (H) 10/18/2019 0340   BUN 101 (H) 10/18/2019 0340   BUN 20 10/06/2019 0820   CREATININE 9.06 (H) 10/18/2019 0340   CALCIUM 8.9 10/18/2019 0340   GFRNONAA 7 (L) 10/18/2019 0340   GFRAA 8 (L) 10/18/2019 0340   CBC    Component Value Date/Time   WBC 12.9 (H) 10/18/2019 0340   RBC 2.63 (L) 10/18/2019 0340   HGB 8.0 (L) 10/18/2019 0340   HCT 24.9 (L) 10/18/2019 0340   PLT 323 10/18/2019 0340   MCV 94.7 10/18/2019 0340   MCH 30.4 10/18/2019 0340   MCHC 32.1 10/18/2019 0340   RDW 16.4 (H) 10/18/2019 0340   LYMPHSABS 2.0 09/28/2019 0540   MONOABS 0.6 09/15/2019 0540   EOSABS 0.6 (H) 10/03/2019 0540   BASOSABS 0.0 09/21/2019 0540     Dialysis Orders: [facility] MWF 4h 36mn 73.5kg 2.2 bath RU AVG (placed 08/15/19) / R TDCHep none - darbe 200 /wk - hect 3 - venofer 100 x 10 , completed  Assessment/Plan: 1. Acute intracranial bleed- w/ midline shift and IVH.s/pEVD. Has been3% salineday #6of total approx 7d per neurology, goal Na+ 150-155. 2. ESRD- usual HD MWF. AVG placed 08/15/19, have been using x 2 wks at OP unit per pt's wife, will continue to use here.Consult [facility] for TDC removal once stable.Recent start in Sept 2020.Have been usinghigherNa+ dialysate levelsto help ^Na+ level. Goal Na+ 150-155.Used 148 flat on Sat,used145on Monday.  3. Anemia ckd- Hbdown today 6.4> 7.0 > 6.9>>8.8>7.2.s/p 2units pRBC on 11/2, Hgb increased to 8.8 and then dropped back to 7.2 this AM.FOBT +. GI has been consulted but unable to perform EGD due to swollen tongue.Continuearanesp 200 mcg q Monday.Transfuse prn.  4. HTN/volume - BPin goal this AM, minoxidil increased to 111mBIDyesterday. Continue cleviprex prn andhis 4 home BP meds per NG,goalSBP <160.Weights upagainthis AM, UF goal 2.5-3.5L with HD tomorrow. 5. H/o CVA -  July 2020 6. UGIB- Dark liquid stool w/o significant response to transfusion. EGD in  08/2019 w/duodenitis w/erosions. Unable to perform EGD due to swollen tongue. 7. Disposition- poor overall prognosis.  Recommend official palliative care consult to help set goals/limits of care as he has not improved.  Donetta Potts, MD [employer] [phone]

## 2019-10-19 NOTE — Progress Notes (Signed)
Progress Note    ASSESSMENT AND PLAN:   2. 33 yo male with HTN, CVA on plavix / asa, ESRD ( recently started dialysis), duodenitis, chronic anemia. Admitted with intracranial hemorrhage.   2.Upper GI bleed.  Known duodenitiswith erosions on EGD in mid September 2020. No other pathology found at time of EGD. Dark liquid stool this admission. We attempted EGD but his tongue was too large to allow passage of endoscope. No further bleeding, just had BM -continue PPI gtt protocol. Tomorrow he starts BID dosing -If bleeds again will consider using a neonatal endoscope but it wouldn't allow for intervention.   3. Acute on chronic anemia.As of yesterday his hgb was stable at 8.0,  last unit of blood was on 07/16/19     Vital signs in last 24 hours: Temp:  [98.4 F (36.9 C)-101.5 F (38.6 C)] 99 F (37.2 C) (11/05 1100) Pulse Rate:  [81-110] 87 (11/05 1100) Resp:  [17-33] 24 (11/05 1100) BP: (107-144)/(45-81) 109/56 (11/05 1100) SpO2:  [95 %-100 %] 100 % (11/05 1100) FiO2 (%):  [40 %-50 %] 40 % (11/05 0809) Weight:  [78.1 kg] 78.1 kg (11/05 0500) Last BM Date: 10/15/2019 General:   Alert, male in NAD Pulm: Trach. On Ventilator. . Abdomen:  Soft, nondistended, nontender.  Normal bowel sounds.          Neurologic:  Alert. Right gaze but does move eyes around with stimulus.   Intake/Output from previous day: 11/04 0701 - 11/05 0700 In: 3352 [I.V.:2021.8; NG/GT:1200; IV Piggyback:130.2] Out: 2092 [Drains:192] Intake/Output this shift: Total I/O In: 494.2 [I.V.:294.2; NG/GT:200] Out: 8 [Drains:8]  Lab Results: Recent Labs    11/11/2019 1726 11/06/2019 1743 10/27/2019 1936 10/18/19 0340  WBC 13.4*  --  10.3 12.9*  HGB 8.4* 9.2* 8.0* 8.0*  HCT 26.4* 27.0* 24.7* 24.9*  PLT 353  --  303 323   BMET Recent Labs    11/03/2019 0450  10/16/2019 1743  10/18/19 0340  10/18/19 2129 10/19/19 0340 10/19/19 0925  NA 147*   < > 149*   < > 155*   < > 152* 153* 153*  K 4.2  --  4.8   --  4.6  --   --   --   --   CL 105  --   --   --  118*  --   --   --   --   CO2 24  --   --   --  20*  --   --   --   --   GLUCOSE 133*  --   --   --  126*  --   --   --   --   BUN 66*  --   --   --  101*  --   --   --   --   CREATININE 7.34*  --   --   --  9.06*  --   --   --   --   CALCIUM 8.7*  --   --   --  8.9  --   --   --   --    < > = values in this interval not displayed.    Ct Head Wo Contrast  Result Date: 11/10/2019 CLINICAL DATA:  Follow-up intracranial hemorrhage EXAM: CT HEAD WITHOUT CONTRAST TECHNIQUE: Contiguous axial images were obtained from the base of the skull through the vertex without intravenous contrast. COMPARISON:  10/16/2019, 10/15/2019, 10/13/2019 FINDINGS: Brain: No change  in size or appearance of a large intraparenchymal hemorrhage centered in the right basal ganglia with extension into the lateral ventricles. Unchanged edema and mass effect with approximately 7 mm right-to-left midline shift. Unchanged caliber and configuration of the lateral ventricles with layering internal blood product and a right frontal approach shunt catheter. Vascular: No hyperdense vessel or unexpected calcification. Skull: Normal. Negative for fracture or focal lesion. Sinuses/Orbits: No acute finding. Other: None. IMPRESSION: 1. No change in size or appearance of a large intraparenchymal hemorrhage centered in the right basal ganglia with extension into the lateral ventricles. 2. Unchanged edema and mass effect with approximately 7 mm right-to-left midline shift. 3. Unchanged caliber and configuration of the lateral ventricles with layering internal blood product and a right frontal approach shunt catheter. Electronically Signed   By: Eddie Candle M.D.   On: 10/27/2019 16:53     Active Problems:   ICH (intracerebral hemorrhage) (HCC)   Acute respiratory failure with hypoxia (HCC)   Malnutrition of moderate degree   Cytotoxic brain edema (HCC)   Obstructive hydrocephalus ([place])    Gastrointestinal hemorrhage     LOS: 8 days   Tye Savoy ,NP 10/19/2019, 11:20 AM

## 2019-10-19 NOTE — Progress Notes (Signed)
SLP Cancellation Note  Patient Details Name: William [name] MRN: [id number] DOB: 1986-08-17   Cancelled treatment:        On vent. Will follow.    Houston Siren 10/19/2019, 7:57 AM  Orbie Pyo Colvin Caroli.Ed Risk analyst [phone] Office [phone]

## 2019-10-19 NOTE — Progress Notes (Addendum)
Neurosurgery Service Progress Note  Subjective: NAE ON  Objective: Vitals:   10/19/19 0300 10/19/19 0400 10/19/19 0500 10/19/19 0600  BP: (!) 110/58 (!) 115/59 (!) 116/57 116/65  Pulse: 88 82 81 82  Resp: _0 (!) 23  Temp: 99.7 F (37.6 C) 98.4 F (36.9 C) 98.6 F (37 C) 98.4 F (36.9 C)  TempSrc:  Esophageal    SpO2: 99% 100% 100% 100%  Weight:   78.1 kg    Temp (24hrs), Avg:99.9 F (37.7 C), Min:97 F (36.1 C), Max:101.5 F (38.6 C)  CBC Latest Ref Rng & Units 10/18/2019 10/29/2019 10/23/2019  WBC 4.0 - 10.5 K/uL 12.9(H) 10.3 -  Hemoglobin 13.0 - 17.0 g/dL 8.0(L) 8.0(L) 9.2(L)  Hematocrit 39.0 - 52.0 % 24.9(L) 24.7(L) 27.0(L)  Platelets 150 - 400 K/uL 323 303 -   BMP Latest Ref Rng & Units 10/19/2019 10/18/2019 10/18/2019  Glucose 70 - 99 mg/dL - - -  BUN 6 - 20 mg/dL - - -  Creatinine 0.61 - 1.24 mg/dL - - -  BUN/Creat Ratio 9 - 20 - - -  Sodium 135 - 145 mmol/L 153(H) 152(H) 148(H)  Potassium 3.5 - 5.1 mmol/L - - -  Chloride 98 - 111 mmol/L - - -  CO2 22 - 32 mmol/L - - -  Calcium 8.9 - 10.3 mg/dL - - -    Intake/Output Summary (Last 24 hours) at 10/19/2019 0724 Last data filed at 10/19/2019 0700 Gross per 24 hour  Intake 3227.01 ml  Output 2092 ml  Net 1135.01 ml    Current Facility-Administered Medications:  .  0.9 %  sodium chloride infusion (Manually program via Guardrails IV Fluids), , Intravenous, Once, Deterding, Guadelupe Sabin, MD .  0.9 %  sodium chloride infusion, , Intravenous, PRN, Rosalin Hawking, MD, Stopped at 10/16/19 2119 .  acetaminophen (TYLENOL) 160 MG/5ML solution 650 mg, 650 mg, Per Tube, Q4H PRN, Rosalin Hawking, MD, 650 mg at 10/19/19 0003 .  amLODipine (NORVASC) tablet 10 mg, 10 mg, Per Tube, QHS, Biby, Sharon L, NP, 10 mg at 10/18/19 2128 .  carvedilol (COREG) tablet 25 mg, 25 mg, Per Tube, BID WC, Aroor, Lanice Schwab, MD, 25 mg at 10/18/19 1745 .  chlorhexidine gluconate (MEDLINE KIT) (PERIDEX) 0.12 % solution 15 mL, 15 mL, Mouth Rinse, BID,  Aroor, Lanice Schwab, MD, 15 mL at 10/18/19 1955 .  Chlorhexidine Gluconate Cloth 2 % PADS 6 each, 6 each, Topical, Q0600, Roney Jaffe, MD, 6 each at 10/16/19 0517 .  Chlorhexidine Gluconate Cloth 2 % PADS 6 each, 6 each, Topical, Q0600, Roney Jaffe, MD, 6 each at 10/19/19 0250 .  clevidipine (CLEVIPREX) infusion 0.5 mg/mL, 0-21 mg/hr, Intravenous, Continuous, Rosalin Hawking, MD, Stopped at 10/18/19 1918 .  cloNIDine (CATAPRES) tablet 0.2 mg, 0.2 mg, Per Tube, TID, Rosalin Hawking, MD, 0.2 mg at 10/18/19 2128 .  [START ON 10/23/2019] Darbepoetin Alfa (ARANESP) injection 200 mcg, 200 mcg, Intravenous, Q Mon-HD, Roney Jaffe, MD .  docusate (COLACE) 50 MG/5ML liquid 100 mg, 100 mg, Per Tube, BID PRN, Merlene Laughter F, NP .  feeding supplement (PRO-STAT SUGAR FREE 64) liquid 30 mL, 30 mL, Per Tube, TID, Mannam, Praveen, MD, 30 mL at 10/18/19 2128 .  feeding supplement (VITAL 1.5 CAL) liquid 1,000 mL, 1,000 mL, Per Tube, Continuous, Mannam, Praveen, MD, Last Rate: 50 mL/hr at 10/19/19 0600 .  fentaNYL (SUBLIMAZE) injection 50 mcg, 50 mcg, Intravenous, Once, Deterding, Guadelupe Sabin, MD .  fentaNYL (SUBLIMAZE) injection 50 mcg, 50 mcg, Intravenous,  Q15 min PRN, Mannam, Praveen, MD, 50 mcg at 10/18/2019 1023 .  fentaNYL (SUBLIMAZE) injection 50-200 mcg, 50-200 mcg, Intravenous, Q30 min PRN, Mannam, Praveen, MD, 200 mcg at 10/19/19 0238 .  heparin injection 1,000-6,000 Units, 1,000-6,000 Units, Intravenous, PRN, Aroor, Lanice Schwab, MD, 1,000 Units at 10/12/19 2215 .  hydrALAZINE (APRESOLINE) tablet 100 mg, 100 mg, Per Tube, TID WC, Biby, Sharon L, NP, 100 mg at 10/18/19 1745 .  labetalol (NORMODYNE) injection 10-20 mg, 10-20 mg, Intravenous, Q10 min PRN, Rosalin Hawking, MD, 20 mg at 10/18/19 1007 .  MEDLINE mouth rinse, 15 mL, Mouth Rinse, 10 times per day, Aroor, Karena Addison R, MD, 15 mL at 10/19/19 0618 .  midazolam (VERSED) injection 2 mg, 2 mg, Intravenous, Q15 min PRN, Mannam, Praveen, MD, 2 mg at 10/16/19  1643 .  midazolam (VERSED) injection 2 mg, 2 mg, Intravenous, Q2H PRN, Mannam, Praveen, MD, 2 mg at 10/18/19 0208 .  minoxidil (LONITEN) tablet 10 mg, 10 mg, Per Tube, BID, Rosalin Hawking, MD, 10 mg at 10/18/19 2128 .  pantoprazole (PROTONIX) 80 mg in sodium chloride 0.9 % 250 mL (0.32 mg/mL) infusion, 8 mg/hr, Intravenous, Continuous, Willia Craze, NP, Last Rate: 25 mL/hr at 10/19/19 0600, 8 mg/hr at 10/19/19 0600 .  [START ON 10/20/2019] pantoprazole (PROTONIX) injection 40 mg, 40 mg, Intravenous, Q12H, Willia Craze, NP .  propofol (DIPRIVAN) 1000 MG/100ML infusion, 5-80 mcg/kg/min, Intravenous, Titrated, Bowser, Laurel Dimmer, NP, Stopped at 10/18/19 1150 .  senna-docusate (Senokot-S) tablet 1 tablet, 1 tablet, Per Tube, BID, Aroor, Lanice Schwab, MD, 1 tablet at 10/18/19 2128 .  sodium chloride (hypertonic) 3 % solution, , Intravenous, Continuous, Rosalin Hawking, MD, Last Rate: 50 mL/hr at 10/19/19 0600 .  sodium chloride flush (NS) 0.9 % injection 10-40 mL, 10-40 mL, Intracatheter, Q12H, Aroor, Lanice Schwab, MD, 10 mL at 10/18/19 2129 .  sodium chloride flush (NS) 0.9 % injection 10-40 mL, 10-40 mL, Intracatheter, PRN, Aroor, Karena Addison R, MD .  spironolactone (ALDACTONE) tablet 25 mg, 25 mg, Per Tube, Daily, Biby, Sharon L, NP, 25 mg at 10/18/19 1008   Physical Exam: Intubated, R gaze deviation, eyes open spont, w/d in RLE, RUE FC to cues, not complex commands EVD in place, site c/d/i  Assessment & Plan: 33 y.o. man w/ ESRD on ASA/plavix s/p R ICH/IVH s/p R EVD. Rpt CTH w/ malpositioned, but patent EVD. 11/2 failed clamp trial 2/2 exam, 11/3 rpt CTH stable, ventricles still decompressed, 4th cleared, expected evolution of peri-hematoma-edema, on 3%  -good exam to follow, clamp EVD today -given change in gaze back to expected R gaze, agree w/ EEG  Judith Part  10/19/19 7:24 AM

## 2019-10-19 NOTE — Progress Notes (Signed)
NAME:  William [name], MRN:  [id number], DOB:  21-Jul-1986, LOS: 8 ADMISSION DATE:  09/29/2019, CONSULTATION DATE:  10/03/2019 REFERRING MD:  Ward  CHIEF COMPLAINT:  AMS.   Brief History    33 y.o. male with PMH of HTN, ESRD and cocaine use with previous stroke on ASA and plavix who woke up 10/28 feeling short of breath and wife noted slurred speech and facial droop.  CT showed large R hemorrhage, pt deteriorated and required intubation.  Pt given DDAVP, neurosurgery and neurology consulted and PCCM consulted.   Past Medical History  CVA 06/2019,HTN,HLD,ESRD iHD TTHS,Cocaine abuse.,Anemia ,Right AV graft placed 08/15/2019  Significant Hospital Events   10/28 > admit, intubated with large R BG ICH; IVD in parenchyma of brain but felt w/ coagulopathy more risk than benefit to re-attempt   10/28: posturing. Still hypertensive. DBP goal <160. Starting on HT saline.   11/1- 1 unit PRBC 11/2- HB drifting down- GI consulted. Om PPI drip. Off sedation on PSV weans 5/5 and following some commands 11/3- Failed EVD clamping due to poor mental status  Consults:  Neurosurgery PCCM. neprhology  GI  Procedures:  ETT 10/28 >> Left IJ CVL 10/28>>>  Significant Diagnostic Tests:  [facility] 10/28 >> right BG ICH with Intraventricular extension and MLS (formal read pending). CT brain 10/28: 1. A 6.8 x 3.6 x 4.9 cm parenchymal hemorrhage centered within the right basal ganglia has not significantly changed in size. Mass effect with partial effacement of ventricular system and unchanged 10 mm leftward midline shift. 2. Redemonstrated intraventricular extension of hemorrhage. Hemorrhage within the right lateral ventricle atrium has increased from prior exam. 3. Interval placement of a right frontal approach ventricular catheter. It is unclear whether the catheter tip terminates within the right basal ganglia or foramen of Monro. However, there are small foci of pneumocephalus within the right frontal horn  CT head  on 10/30- Unchanged right basal ganglia and intraventricular clot. Improved ventriculomegaly. Stable 1 cm leftward midline shift.  CT head 11/1: essentially unchanged-  CT Head 11/2: R basal ganglia hemorrhage with intraventricular extension, stable mass 63mm with R to L midline shift. R frontal EVD tip terminating near R thalamus. Interval worsening of cerebral edema  Micro Data:  SARS CoV2 10/28 >> neg urine culture 10/31-multiple species.  Antimicrobials:  Ceftriaxone 10/31>>  Interim history/subjective:  No sig change overnight  HD yesterday  EVD clamped  Having EEG   Objective:  Blood pressure 133/81, pulse 88, temperature 98.8 F (37.1 C), resp. rate (!) 24, weight 78.1 kg, SpO2 100 %.    Vent Mode: PSV;CPAP FiO2 (%):  [40 %-50 %] 40 % Set Rate:  [18 bmp] 18 bmp Vt Set:  [560 mL] 560 mL PEEP:  [5 cmH20] 5 cmH20 Pressure Support:  [10 cmH20] 10 cmH20 Plateau Pressure:  [16 cmH20-23 cmH20] 16 cmH20   Intake/Output Summary (Last 24 hours) at 10/19/2019 0906 Last data filed at 10/19/2019 0800 Gross per 24 hour  Intake 3248.79 ml  Output 2095 ml  Net 1153.79 ml   Filed Weights   10/24/2019 0448 10/18/19 0500 10/19/19 0500  Weight: 79.3 kg 77.8 kg 78.1 kg    Examination: Gen: Chronically ill-appearing, NAD on vent  HEENT: Right EVD in place, ET tube in place, trachea midline  lungs: resps even non labored on PS 10/5, RR 22, coarse  CV: S1-S2 appreciated Abd: Soft round ndnt. + bowel sounds Ext.  Bilateral upper extremity edema Neuro: opens eyes to pain but does not track,  R gaze, does not follow commands.   Assessment & Plan:   Right basal ganglia intracerebral hemorrhage, complicated by intraventricular bleeding History of prior CVA Right EVD in place  SBP goal less than 160, now off cleviprex Continue hypertonic saline with goal sodium of 150-1 55 Drain management per neurosurgery Continue neurochecks EEG  F/u CT in am 11/6   Acute hypoxemic respiratory  failure In the setting of basal ganglia intracerebral hemorrhage Vent support - 8cc/kg  F/u CXR  F/u ABG Daily SBT - ok for PS wean as tol but current mental status precludes extubation  Pulmonary hygiene VAP order set in place  End-stage renal disease on hemodialysis Tunneled HD catheter in place, has right AV graft Nephrology following for dialysis  Hypertension Continue clonidine, hydralazine, Coreg as scheduled Off cleviprex  Goal SBP less than 160 Volume removal with HD per renal   History of polysubstance abuse, history of cocaine abuse Supportive measures SW   Anemia, guaiac positive stools EGD attempted but failed Continue Protonix GTT Appreciate GI assistance Trend CBC and transfuse hemoglobin less than 7  Right index finger edema Monitor X-ray of the hand on 10/28/2019 was unremarkable-soft tissue swelling  Best Practice:  Diet: Continue tube feed Pain/Anxiety/Delirium protocol (if indicated): Fentanyl PRN / Midazolam PRN.  RASS goal 0  VAP protocol (if indicated): In place DVT prophylaxis: SCDs GI prophylaxis: PPI gtt  Glucose control: SSI. Mobility: Bedrest Code Status: Full code Family Communication: per primary  Disposition: ICU. Guarded prognosis   Nickolas Madrid, NP 10/19/2019  9:06 AM Pager: [phone] or [phone]

## 2019-10-19 NOTE — Progress Notes (Signed)
EEG completed, results pending. 

## 2019-10-19 NOTE — Procedures (Signed)
Patient Name: [name]  MRN: [id number]  Epilepsy Attending: Lora Havens  Referring Physician/Provider: Dr. Rosalin Hawking Date: 10/19/2019 Duration: 24.20 minutes  Patient history: 33yo F with right basal ganglia hemorrhage with left-sided plegia and right gaze deviation.EEG to evaluate for seizure.  Level of alertness: obtunded/coma  AEDs during EEG study: versed   Technical aspects: This EEG study was done with scalp electrodes positioned according to the 10-20 International system of electrode placement. Electrical activity was acquired at a sampling rate of 500Hz  and reviewed with a high frequency filter of 70Hz  and a low frequency filter of 1Hz . EEG data were recorded continuously and digitally stored.   DESCRIPTION: EEG showed continuous generalized 2-5hz  theta-delta slowing. EEG was reactive to tactile stimulation. Hyperventilation and photic stimulation were not performed.  ABNORMALITY - Continuous slow, generalized   IMPRESSION: This study is suggestive of severe diffuse encephalopathy, non specific to etiology. No seizures or epileptiform discharges were seen throughout the recording.     William [name] William [name]

## 2019-10-20 ENCOUNTER — Inpatient Hospital Stay (HOSPITAL_COMMUNITY): Payer: Medicaid Other

## 2019-10-20 LAB — SODIUM
Sodium: 155 mmol/L — ABNORMAL HIGH (ref 135–145)
Sodium: 158 mmol/L — ABNORMAL HIGH (ref 135–145)
Sodium: 152 mmol/L — ABNORMAL HIGH (ref 135–145)

## 2019-10-20 LAB — RENAL FUNCTION PANEL
Albumin: 2.5 g/dL — ABNORMAL LOW (ref 3.5–5.0)
Albumin: 2.4 g/dL — ABNORMAL LOW (ref 3.5–5.0)
Anion gap: 17 — ABNORMAL HIGH (ref 5–15)
Anion gap: 16 — ABNORMAL HIGH (ref 5–15)
BUN: 111 mg/dL — ABNORMAL HIGH (ref 6–20)
BUN: 59 mg/dL — ABNORMAL HIGH (ref 6–20)
CO2: 20 mmol/L — ABNORMAL LOW (ref 22–32)
CO2: 23 mmol/L (ref 22–32)
Calcium: 9.3 mg/dL (ref 8.9–10.3)
Calcium: 8.7 mg/dL — ABNORMAL LOW (ref 8.9–10.3)
Chloride: 119 mmol/L — ABNORMAL HIGH (ref 98–111)
Chloride: 110 mmol/L (ref 98–111)
Creatinine, Ser: 9.65 mg/dL — ABNORMAL HIGH (ref 0.61–1.24)
Creatinine, Ser: 5.46 mg/dL — ABNORMAL HIGH (ref 0.61–1.24)
GFR calc Af Amer: 7 mL/min — ABNORMAL LOW (ref >60)
GFR calc Af Amer: 14 mL/min — ABNORMAL LOW (ref >60)
GFR calc non Af Amer: 6 mL/min — ABNORMAL LOW (ref >60)
GFR calc non Af Amer: 12 mL/min — ABNORMAL LOW (ref >60)
Glucose, Bld: 145 mg/dL — ABNORMAL HIGH (ref 70–99)
Glucose, Bld: 138 mg/dL — ABNORMAL HIGH (ref 70–99)
Phosphorus: 6.3 mg/dL — ABNORMAL HIGH (ref 2.5–4.6)
Phosphorus: 4.1 mg/dL (ref 2.5–4.6)
Potassium: 5.6 mmol/L — ABNORMAL HIGH (ref 3.5–5.1)
Potassium: 4.2 mmol/L (ref 3.5–5.1)
Sodium: 156 mmol/L — ABNORMAL HIGH (ref 135–145)
Sodium: 149 mmol/L — ABNORMAL HIGH (ref 135–145)

## 2019-10-20 LAB — CBC
HCT: 21.1 % — ABNORMAL LOW (ref 39.0–52.0)
HCT: 22.4 % — ABNORMAL LOW (ref 39.0–52.0)
Hemoglobin: 6.2 g/dL — CL (ref 13.0–17.0)
Hemoglobin: 6.9 g/dL — CL (ref 13.0–17.0)
MCH: 29.2 pg (ref 26.0–34.0)
MCH: 29.6 pg (ref 26.0–34.0)
MCHC: 29.4 g/dL — ABNORMAL LOW (ref 30.0–36.0)
MCHC: 30.8 g/dL (ref 30.0–36.0)
MCV: 99.5 fL (ref 80.0–100.0)
MCV: 96.1 fL (ref 80.0–100.0)
Platelets: 285 10*3/uL (ref 150–400)
Platelets: 270 10*3/uL (ref 150–400)
RBC: 2.12 MIL/uL — ABNORMAL LOW (ref 4.22–5.81)
RBC: 2.33 MIL/uL — ABNORMAL LOW (ref 4.22–5.81)
RDW: 17 % — ABNORMAL HIGH (ref 11.5–15.5)
RDW: 17.4 % — ABNORMAL HIGH (ref 11.5–15.5)
WBC: 12.2 10*3/uL — ABNORMAL HIGH (ref 4.0–10.5)
WBC: 15.4 10*3/uL — ABNORMAL HIGH (ref 4.0–10.5)
nRBC: 0.6 % — ABNORMAL HIGH (ref 0.0–0.2)
nRBC: 0.4 % — ABNORMAL HIGH (ref 0.0–0.2)

## 2019-10-20 LAB — GLUCOSE, CAPILLARY
Glucose-Capillary: 135 mg/dL — ABNORMAL HIGH (ref 70–99)
Glucose-Capillary: 146 mg/dL — ABNORMAL HIGH (ref 70–99)
Glucose-Capillary: 147 mg/dL — ABNORMAL HIGH (ref 70–99)
Glucose-Capillary: 145 mg/dL — ABNORMAL HIGH (ref 70–99)
Glucose-Capillary: 132 mg/dL — ABNORMAL HIGH (ref 70–99)

## 2019-10-20 LAB — PREPARE RBC (CROSSMATCH)

## 2019-10-20 MED ORDER — PROPOFOL 1000 MG/100ML IV EMUL
0.0000 ug/kg/min | INTRAVENOUS | Status: DC
  Administered 2019-10-20 (×2): 10 ug/kg/min via INTRAVENOUS
  Administered 2019-10-21: 15 ug/kg/min via INTRAVENOUS
  Administered 2019-10-21 – 2019-10-22 (×4): 25 ug/kg/min via INTRAVENOUS
  Administered 2019-10-23: 20 ug/kg/min via INTRAVENOUS
  Administered 2019-10-23: 25 ug/kg/min via INTRAVENOUS
  Administered 2019-10-24 (×2): 20 ug/kg/min via INTRAVENOUS
  Filled 2019-10-20 (×10): qty 100

## 2019-10-20 MED ORDER — PENTAFLUOROPROP-TETRAFLUOROETH EX AERO: 1.0000 "application " | INHALATION_SPRAY | CUTANEOUS | Status: DC | PRN

## 2019-10-20 MED ORDER — HEPARIN SODIUM (PORCINE) 1000 UNIT/ML DIALYSIS: 1000.0000 [IU] | INTRAMUSCULAR | Status: DC | PRN

## 2019-10-20 MED ORDER — SODIUM CHLORIDE 0.9 % IV SOLN
80.0000 mg | Freq: Once | INTRAVENOUS | Status: AC
  Administered 2019-10-20: 80 mg via INTRAVENOUS
  Filled 2019-10-20: qty 80

## 2019-10-20 MED ORDER — PROPOFOL 1000 MG/100ML IV EMUL
INTRAVENOUS | Status: AC
  Administered 2019-10-20: 10 ug/kg/min via INTRAVENOUS
  Filled 2019-10-20: qty 100

## 2019-10-20 MED ORDER — LIDOCAINE HCL (PF) 1 % IJ SOLN: 5.0000 mL | INTRAMUSCULAR | Status: DC | PRN

## 2019-10-20 MED ORDER — SODIUM CHLORIDE 0.9 % IV SOLN
8.0000 mg/h | INTRAVENOUS | Status: DC
  Administered 2019-10-20 – 2019-10-23 (×6): 8 mg/h via INTRAVENOUS
  Filled 2019-10-20 (×11): qty 80

## 2019-10-20 MED ORDER — DIPHENHYDRAMINE HCL 50 MG/ML IJ SOLN
INTRAMUSCULAR | Status: AC
  Administered 2019-10-20: 50 mg
  Filled 2019-10-20: qty 1

## 2019-10-20 MED ORDER — SODIUM CHLORIDE 0.9 % IV SOLN: 100.0000 mL | INTRAVENOUS | Status: DC | PRN

## 2019-10-20 MED ORDER — ALTEPLASE 2 MG IJ SOLR: 2.0000 mg | Freq: Once | INTRAMUSCULAR | Status: DC | PRN

## 2019-10-20 MED ORDER — ALBUTEROL SULFATE (2.5 MG/3ML) 0.083% IN NEBU: 2.5000 mg | INHALATION_SOLUTION | RESPIRATORY_TRACT | Status: DC | PRN

## 2019-10-20 MED ORDER — ALBUTEROL SULFATE (2.5 MG/3ML) 0.083% IN NEBU
INHALATION_SOLUTION | RESPIRATORY_TRACT | Status: AC
  Administered 2019-10-20: 2.5 mg
  Filled 2019-10-20: qty 3

## 2019-10-20 MED ORDER — SODIUM CHLORIDE 0.9% IV SOLUTION
Freq: Once | INTRAVENOUS | Status: AC
  Administered 2019-10-20: 08:00:00 via INTRAVENOUS

## 2019-10-20 MED ORDER — FENTANYL CITRATE (PF) 100 MCG/2ML IJ SOLN
50.0000 ug | INTRAMUSCULAR | Status: DC | PRN
  Administered 2019-10-21 – 2019-10-22 (×2): 100 ug via INTRAVENOUS
  Filled 2019-10-20 (×2): qty 2

## 2019-10-20 MED ORDER — MIDAZOLAM HCL 2 MG/2ML IJ SOLN
2.0000 mg | INTRAMUSCULAR | Status: DC | PRN
  Administered 2019-10-26 (×2): 2 mg via INTRAVENOUS
  Filled 2019-10-20 (×2): qty 2

## 2019-10-20 MED ORDER — LIDOCAINE-PRILOCAINE 2.5-2.5 % EX CREA
1.0000 "application " | TOPICAL_CREAM | CUTANEOUS | Status: DC | PRN
  Filled 2019-10-20: qty 5

## 2019-10-20 NOTE — Progress Notes (Signed)
CRITICAL VALUE ALERT  Critical Value:  Hmg 6.2  Date & Time Notied:  0623 10/20/19  Provider Notified: Elink/ Aventura  Orders Received/Actions taken: Awaiting orders

## 2019-10-20 NOTE — Progress Notes (Signed)
SLP Discharge Note  Patient Details Name: William [name] MRN: [id number] DOB: 06-07-1986  Pt remains on ventilator.  Our service will sign off.  Please re-order when indicated.  Thank you, Nial Hawe L. Tivis Ringer, [place] CCC/SLP Acute Rehabilitation Services Office number [phone] Pager [phone]          [name] 10/20/2019, 10:06 AM

## 2019-10-20 NOTE — Progress Notes (Signed)
Developed acute agitation.  Noted to have wheezing.  Will give nebulizer tx and add diprivan for sedation with prn versed/fentanyl.  Chesley Mires, MD [facility] Pulmonary/Critical Care 10/20/2019, 10:19 AM

## 2019-10-20 NOTE — Procedures (Signed)
I was present at this dialysis session. I have reviewed the session itself and made appropriate changes.   Vital signs in last 24 hours:  Temp:  [97.9 F (36.6 C)-100.6 F (38.1 C)] 98.4 F (36.9 C) (11/06 0915) Pulse Rate:  [83-110] 91 (11/06 0915) Resp:  [21-35] 31 (11/06 0915) BP: (109-187)/(54-103) 148/90 (11/06 0915) SpO2:  [99 %-100 %] 99 % (11/06 0915) FiO2 (%):  [40 %] 40 % (11/06 0757) Weight:  [86 kg] 86 kg (11/06 0500) Weight change: 7.9 kg Filed Weights   10/18/19 0500 10/19/19 0500 10/20/19 0500  Weight: 77.8 kg 78.1 kg 86 kg    Recent Labs  Lab 10/20/19 0506  NA 156*  K 5.6*  CL 119*  CO2 20*  GLUCOSE 145*  BUN 111*  CREATININE 9.65*  CALCIUM 9.3  PHOS 6.3*    Recent Labs  Lab 10/29/2019 1936 10/18/19 0340 10/20/19 0506  WBC 10.3 12.9* 12.2*  HGB 8.0* 8.0* 6.2*  HCT 24.7* 24.9* 21.1*  MCV 92.9 94.7 99.5  PLT 303 323 285    Scheduled Meds: . amLODipine  10 mg Per Tube QHS  . carvedilol  25 mg Per Tube BID WC  . chlorhexidine gluconate (MEDLINE KIT)  15 mL Mouth Rinse BID  . Chlorhexidine Gluconate Cloth  6 each Topical Q0600  . Chlorhexidine Gluconate Cloth  6 each Topical Q0600  . cloNIDine  0.2 mg Per Tube TID  . [START ON 10/23/2019] darbepoetin (ARANESP) injection - DIALYSIS  200 mcg Intravenous Q Mon-HD  . feeding supplement (PRO-STAT SUGAR FREE 64)  30 mL Per Tube TID  . hydrALAZINE  100 mg Per Tube TID WC  . mouth rinse  15 mL Mouth Rinse 10 times per day  . minoxidil  10 mg Per Tube BID  . pantoprazole  40 mg Intravenous Q12H  . senna-docusate  1 tablet Per Tube BID  . sodium chloride flush  10-40 mL Intracatheter Q12H  . spironolactone  25 mg Per Tube Daily   Continuous Infusions: . sodium chloride Stopped (10/16/19 2119)  . feeding supplement (VITAL 1.5 CAL) 1,000 mL (10/20/19 0712)  . sodium chloride (hypertonic) 50 mL/hr (10/20/19 0300)   PRN Meds:.sodium chloride, acetaminophen (TYLENOL) oral liquid 160 mg/5 mL, docusate,  fentaNYL (SUBLIMAZE) injection, heparin, labetalol, midazolam, sodium chloride flush    Dialysis Orders: [facility] MWF 4h 28mn 73.5kg 2.2 bath RU AVG (placed 08/15/19) / R TDCHep none - darbe 200 /wk - hect 3 - venofer 100 x 10 , completed  Assessment/Plan: 1. Acute intracranial bleed- w/ midline shift and IVH.s/pEVD. Has been3% salineday #6of total approx 7d per neurology, goal Na+ 150-155. 2. ESRD- usual HD MWF. AVG placed 08/15/19, have been using x 2 wks at OP unit per pt's wife, will continue to use here.Consult [facility] for TDC removal once stable.Recent start in Sept 2020.Have been usinghigherNa+ dialysate levelsto help ^Na+ level. Goal Na+ 150-155.Used 148 flat on Sat,used145on Monday but Na dropped so using 148 flat today. 3. Anemia ckd- Hbdown today 6.4> 7.0 > 6.9>>8.8>7.2.s/p 2units pRBC on 11/2, Hgb increased to 8.8 and then dropped back to 7.2 this AM.FOBT +. GI has been consultedbut unable to perform EGD due to swollen tongue.Continuearanesp 200 mcg q Monday.Transfuse prn.  4. HTN/volume - BPin goal this AM, minoxidil increased to 161mBIDyesterday. Continue cleviprex prn andhis 4 home BP meds per NG,goalSBP <160.Weights upagainthis AM, UF goal 2.5-3.5L with HD tomorrow. 5. H/o CVA - July 2020 6. UGIB- Dark liquid stool w/o significant response  to transfusion. EGD in 08/2019 w/duodenitis w/erosions.Unable to perform EGD due to swollen tongue. 7. Disposition- poor overall prognosis.  Recommend official palliative care consult to help set goals/limits of care as he has not improved.  Donetta Potts,  MD 10/20/2019, 9:43 AM

## 2019-10-20 NOTE — Progress Notes (Signed)
Neurosurgery Service Progress Note  Subjective: NAE ON  Objective: Vitals:   10/20/19 0500 10/20/19 0600 10/20/19 0700 10/20/19 0757  BP: (!) 151/83 (!) 154/82 (!) 150/84 (!) 157/82  Pulse: 87 86 85 90  Resp: (!) 24 (!) 24 (!) 24 (!) 24  Temp: 97.9 F (36.6 C) 97.9 F (36.6 C) 97.9 F (36.6 C)   TempSrc:      SpO2: 100% 99% 100% 100%  Weight: 86 kg      Temp (24hrs), Avg:99.1 F (37.3 C), Min:97.9 F (36.6 C), Max:100.6 F (38.1 C)  CBC Latest Ref Rng & Units 10/20/2019 10/18/2019 10/25/2019  WBC 4.0 - 10.5 K/uL 12.2(H) 12.9(H) 10.3  Hemoglobin 13.0 - 17.0 g/dL 6.2(LL) 8.0(L) 8.0(L)  Hematocrit 39.0 - 52.0 % 21.1(L) 24.9(L) 24.7(L)  Platelets 150 - 400 K/uL 285 323 303   BMP Latest Ref Rng & Units 10/20/2019 10/20/2019 10/19/2019  Glucose 70 - 99 mg/dL 145(H) - -  BUN 6 - 20 mg/dL 111(H) - -  Creatinine 0.61 - 1.24 mg/dL 9.65(H) - -  BUN/Creat Ratio 9 - 20 - - -  Sodium 135 - 145 mmol/L 156(H) 155(H) 153(H)  Potassium 3.5 - 5.1 mmol/L 5.6(H) - -  Chloride 98 - 111 mmol/L 119(H) - -  CO2 22 - 32 mmol/L 20(L) - -  Calcium 8.9 - 10.3 mg/dL 9.3 - -    Intake/Output Summary (Last 24 hours) at 10/20/2019 0815 Last data filed at 10/20/2019 0100 Gross per 24 hour  Intake 1901.69 ml  Output -  Net 1901.69 ml    Current Facility-Administered Medications:  .  0.9 %  sodium chloride infusion (Manually program via Guardrails IV Fluids), , Intravenous, Once, [place], Emily T, MD .  0.9 %  sodium chloride infusion, , Intravenous, PRN, Rosalin Hawking, MD, Stopped at 10/16/19 2119 .  acetaminophen (TYLENOL) 160 MG/5ML solution 650 mg, 650 mg, Per Tube, Q4H PRN, Rosalin Hawking, MD, 650 mg at 10/19/19 1940 .  amLODipine (NORVASC) tablet 10 mg, 10 mg, Per Tube, QHS, Biby, Sharon L, NP, 10 mg at 10/19/19 2119 .  carvedilol (COREG) tablet 25 mg, 25 mg, Per Tube, BID WC, Aroor, Lanice Schwab, MD, 25 mg at 10/19/19 1728 .  chlorhexidine gluconate (MEDLINE KIT) (PERIDEX) 0.12 % solution 15 mL, 15 mL,  Mouth Rinse, BID, Aroor, Lanice Schwab, MD, 15 mL at 10/19/19 1942 .  Chlorhexidine Gluconate Cloth 2 % PADS 6 each, 6 each, Topical, Q0600, Roney Jaffe, MD, 6 each at 10/19/19 0250 .  Chlorhexidine Gluconate Cloth 2 % PADS 6 each, 6 each, Topical, Q0600, Donato Heinz, MD, 6 each at 10/19/19 1146 .  cloNIDine (CATAPRES) tablet 0.2 mg, 0.2 mg, Per Tube, TID, Rosalin Hawking, MD, 0.2 mg at 10/19/19 2119 .  [START ON 10/23/2019] Darbepoetin Alfa (ARANESP) injection 200 mcg, 200 mcg, Intravenous, Q Mon-HD, Roney Jaffe, MD .  docusate (COLACE) 50 MG/5ML liquid 100 mg, 100 mg, Per Tube, BID PRN, Merlene Laughter F, NP .  feeding supplement (PRO-STAT SUGAR FREE 64) liquid 30 mL, 30 mL, Per Tube, TID, Mannam, Praveen, MD, 30 mL at 10/19/19 2119 .  feeding supplement (VITAL 1.5 CAL) liquid 1,000 mL, 1,000 mL, Per Tube, Continuous, Rosalin Hawking, MD, Last Rate: 55 mL/hr at 10/20/19 0712, 1,000 mL at 10/20/19 0712 .  fentaNYL (SUBLIMAZE) injection 50-200 mcg, 50-200 mcg, Intravenous, Q30 min PRN, Mannam, Praveen, MD, 100 mcg at 10/19/19 1400 .  heparin injection 1,000-6,000 Units, 1,000-6,000 Units, Intravenous, PRN, Aroor, Lanice Schwab, MD, 1,000 Units at  10/12/19 2215 .  hydrALAZINE (APRESOLINE) tablet 100 mg, 100 mg, Per Tube, TID WC, Biby, Sharon L, NP, 100 mg at 10/19/19 1728 .  labetalol (NORMODYNE) injection 10-20 mg, 10-20 mg, Intravenous, Q10 min PRN, Rosalin Hawking, MD, 20 mg at 10/20/19 0604 .  MEDLINE mouth rinse, 15 mL, Mouth Rinse, 10 times per day, Aroor, Karena Addison R, MD, 15 mL at 10/20/19 0508 .  midazolam (VERSED) injection 2 mg, 2 mg, Intravenous, Q2H PRN, Mannam, Praveen, MD, 2 mg at 10/18/19 0208 .  minoxidil (LONITEN) tablet 10 mg, 10 mg, Per Tube, BID, Rosalin Hawking, MD, 10 mg at 10/19/19 2119 .  pantoprazole (PROTONIX) injection 40 mg, 40 mg, Intravenous, Q12H, Willia Craze, NP, 40 mg at 10/20/19 0159 .  senna-docusate (Senokot-S) tablet 1 tablet, 1 tablet, Per Tube, BID, Aroor, Lanice Schwab, MD, 1 tablet at 10/19/19 2119 .  sodium chloride (hypertonic) 3 % solution, , Intravenous, Continuous, Rosalin Hawking, MD, Last Rate: 50 mL/hr at 10/20/19 0300, 50 mL/hr at 10/20/19 0300 .  sodium chloride flush (NS) 0.9 % injection 10-40 mL, 10-40 mL, Intracatheter, Q12H, Aroor, Karena Addison R, MD, 10 mL at 10/20/19 0111 .  sodium chloride flush (NS) 0.9 % injection 10-40 mL, 10-40 mL, Intracatheter, PRN, Aroor, Karena Addison R, MD .  spironolactone (ALDACTONE) tablet 25 mg, 25 mg, Per Tube, Daily, Biby, Sharon L, NP, 25 mg at 10/19/19 6484   Physical Exam: Intubated, R gaze deviation but able to cross midline some today, eyes open spont, w/d on R, some spontaneous movement on L, not FC EVD in place, site c/d/i  Assessment & Plan: 33 y.o. man w/ ESRD on ASA/plavix s/p R ICH/IVH s/p R EVD. Rpt CTH w/ malpositioned, but patent EVD. 11/2 failed clamp trial 2/2 exam, 11/3 rpt CTH stable, ventricles still decompressed, 4th cleared, expected evolution of peri-hematoma-edema, on 3%, 11/5 EVD clamped, 11/6 CTH stable - fourth ventricle slightly increased in size.  -keep EVD clamped, repeat CTH tomorrow, if ventricles stable then will d/c EVD  Judith Part  10/20/19 8:15 AM

## 2019-10-20 NOTE — Progress Notes (Signed)
STROKE TEAM PROGRESS NOTE   INTERVAL HISTORY Pt RN and HD nurse at bedside. As per RN, pt had acute agitation, SOB, gasping for air shortly after blood transfusion today. Put back on propofol sedation, gradually pt agitation resolved. Not considered as transfusion reaction. Repeat CT in am stable. EVD continues to be clamped and NSG will repeat CT in am. Pt mental status seems getting worse from yesterday and continued trend of worsening for the last 2-3 days. Discussed with wife over the phone, she would like aggressive care and keep full code.   OBJECTIVE Vitals:   10/20/19 0600 10/20/19 0700 10/20/19 0757 10/20/19 0815  BP: (!) 154/82 (!) 150/84 (!) 157/82 (!) 159/86  Pulse: 86 85 90 94  Resp: (!) 24 (!) 24 (!) 24 (!) 26  Temp: 97.9 F (36.6 C) 97.9 F (36.6 C)  99.7 F (37.6 C)  TempSrc:    Oral  SpO2: 99% 100% 100%   Weight:        CBC:  Recent Labs  Lab 10/18/19 0340 10/20/19 0506  WBC 12.9* 12.2*  HGB 8.0* 6.2*  HCT 24.9* 21.1*  MCV 94.7 99.5  PLT 323 [id number]    Basic Metabolic Panel:  Recent Labs  Lab 10/13/19 1633  10/19/19 1155  10/20/19 0306 10/20/19 0506  NA  --    < > 153*   < > 155* 156*  K  --    < > 5.0  --   --  5.6*  CL  --    < > 117*  --   --  119*  CO2  --    < > 23  --   --  20*  GLUCOSE  --    < > 128*  --   --  145*  BUN  --    < > 86*  --   --  111*  CREATININE  --    < > 8.18*  --   --  9.65*  CALCIUM  --    < > 9.2  --   --  9.3  MG 2.2  --   --   --   --   --   PHOS 4.5  --  5.3*  --   --  6.3*   < > = values in this interval not displayed.    Lipid Panel:     Component Value Date/Time   CHOL 253 (H) 06/23/2019 0301   TRIG 126 11/12/2019 1729   HDL 67 06/23/2019 0301   CHOLHDL 3.8 06/23/2019 0301   VLDL 28 06/23/2019 0301   LDLCALC 158 (H) 06/23/2019 0301   HgbA1c:  Lab Results  Component Value Date   HGBA1C 4.9 06/23/2019   Urine Drug Screen:     Component Value Date/Time   LABOPIA NONE DETECTED 09/15/2019 0648    COCAINSCRNUR NONE DETECTED 10/02/2019 0648   LABBENZ NONE DETECTED 09/18/2019 0648   AMPHETMU NONE DETECTED 09/24/2019 0648   THCU NONE DETECTED 10/02/2019 0648   LABBARB NONE DETECTED 09/21/2019 0648    Alcohol Level No results found for: ETH  IMAGING Ct Head Wo Contrast 10/20/2019 1. Unchanged large right basal ganglia hemorrhage with lateral and third ventricular extension. Stable swelling and 7 mm of midline shift. Stable ventricular volume. 2. Background chronic small vessel ischemia.   Dg Chest Port 1 View 10/20/2019 1. Mild diffuse bilateral airspace opacification may be due to edema or pneumonia. 2. Improving left lower lobe consolidation.    Ct Head Wo  Contrast 10/15/2019 2653 1. No change in size or appearance of a large intraparenchymal hemorrhage centered in the right basal ganglia with extension into the lateral ventricles. 2. Unchanged edema and mass effect with approximately 7 mm right-to-left midline shift. 3. Unchanged caliber and configuration of the lateral ventricles with layering internal blood product and a right frontal approach shunt catheter.   Dg Hand 2 View Right 10/23/2019 Soft tissue enlargement of the proximal right second digit without adjacent osseous or joint abnormality. This was present but smaller on the 2016 study and is likely benign with a nonaggressive appearance.     CT Head Wo Contrast 10/16/2019 1. No significant interval change in size and appearance of right basal ganglia hemorrhage with intraventricular extension. Stable regional mass effect with up to 7 mm of localized right-to-left midline shift. 2. Stable right frontal approach ventriculostomy with tip terminating near the right thalamus. Stable ventricular size and morphology. 3. Slightly decreased cortical sulcation throughout the brain as compared to previous, suggesting worsened cerebral edema. 4. No other new acute intracranial abnormality.  CT Head Wo Contrast 10/15/2019 1. Unchanged  size of large intraparenchymal hematoma centered in the right basal ganglia with intraventricular extension. 2. Decreased size of the lateral ventricles with unchanged 6 mm of leftward midline shift. 3. No new site of hemorrhage.  Ct Head Wo Contrast 10/13/2019 1. Unchanged right basal ganglia and intraventricular clot.  2. Improved ventriculomegaly.  3. Stable 1 cm leftward midline shift.   Dg Chest Port 1 View 10/13/2019 1. Unremarkable hardware positioning.  2. Improved aeration but persistent dense opacity behind the heart.   Transthoracic Echocardiogram  1. The left ventricle has hyperdynamic systolic function, with an ejection fraction of >65%. The cavity size was decreased. There is severe concentric left ventricular hypertrophy. Left ventricular diastolic parameters were normal. No evidence of left  ventricular regional wall motion abnormalities.  2. The right ventricle has normal systolic function. The cavity was normal. There is no increase in right ventricular wall thickness. Right ventricular systolic pressure could not be assessed.  3. Small pericardial effusion.  4. No evidence of mitral valve stenosis.  5. The aortic valve is tricuspid. No stenosis of the aortic valve.  EEG 10/15/2019 This is an abnormal electroencephalogram secondary to focal right hemispheric slowing superimposed on a diffusely slow background.  Occasional right frontal sharp waves are noted as well.   These findings are consistent with the patient's history of right ICH with right EVD placement.  No subclinical seizure activity is noted.     PHYSICAL EXAM  Temp:  [97.9 F (36.6 C)-100.6 F (38.1 C)] 99.7 F (37.6 C) (11/06 0815) Pulse Rate:  [83-110] 94 (11/06 0815) Resp:  [21-35] 26 (11/06 0815) BP: (109-164)/(54-91) 159/86 (11/06 0815) SpO2:  [99 %-100 %] 100 % (11/06 0757) FiO2 (%):  [40 %] 40 % (11/06 0757) Weight:  [86 kg] 86 kg (11/06 0500)  General - Well nourished, well developed,  intubated on sedation, mild respiratory distress.   Ophthalmologic - fundi not visualized due to noncooperation.  Cardiovascular - Regular rhythm and tachycardia.  Neuro - intubated on sedation, intermittently open eyes on voice but not tracking, not following commands, eyes in upper gaze position. Not blinking to visual threat bilaterally, PERRL. Corneal reflex weakly present, gag and cough present. Breathing over the vent.  Facial symmetry not able to test due to ET tube.  Tongue protrusion not cooperative. No withdraw to painful stimulation in all extremities. DTR diminished and no babinski. Sensation,  coordination and gait not tested.   ASSESSMENT/PLAN [name] is a 33 y.o. male with history of hypertension, end-stage renal disease on ASA+Plavix for recent stroke presents to the emergency department obtunded with left-sided plegia and right gaze deviation. CT head showed 60 cc hematoma in the right basal ganglia with intraventricular extension, hydrocephalus, and 1 cm of midline shift. Patient was emergency intubated mainly for airway protection. EVD was emergently placed at bedside by [place] for hydrocephalus. Patient also was given desmopressin given the fact that he is using Plavix with small studies supporting its use in the setting of antiplatelets diminishing incidence of hemorrhage expansion.   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 Mass effect. 13mm L midline shift. IVH increased in R lateral ventricle atrium. R EVD  Repeat CT head - 10/13/19 - unchanged R basal ganglia ICH and IVH. Improved ventriculomegaly. Stable 1cm midline shift.  [facility] 10/15/19 - Unchanged size of large intraparenchymal hematoma centered in the right basal ganglia with intraventricular extension.Decreased size of  the lateral ventricles with unchanged 6 mm of leftward midline shift.  Repeat CT head 11/2 no significant change in hemorrhage, 25mm midline shift. EVD w/ stable ventricle size. Slight decreased cortical sulcation suggesting worsening edema.  Repeat CT 11/3 no change in hemorrhage or mass effect, 51mm midline shift.EVD. no change in ventricles  Repeat CT head 11/6 unchanged basal ganglia hemorrhage 76mm midline shift.   2D Echo - EF >65%. No source of embolus   EEG no sz.  Repeat EEG no sz, diffused encephalopathy  [employer] Virus 2  neg  LDL - 158 in July  HgbA1c 4.9 in July    UDS neg  VTE prophylaxis - SCDs only d/t anemia needing transfusion  ASA + Plavix prior to admission, now not indicated  Therapy recommendations:  Pending (need to reorder when ready)  Disposition:  Pending  Discussed with wife today over the phone, wife wants every means of aggressive care (including trach and PEG) and full code.   Cerebral Edema Obstructive hydrocephalus  EVD placed in ED (Ostergard)  Na 145->148->153->155->156   Goal Na 150-155  On 3%, will start to taper off after EVD removal   NSG clamped EVD 11/2 - worsening neuro exam - opened  Ventricles remain open. NSG does not feel neuro worsening r/t hydrocephalus  EVD clamp again 11/5  Repeat CT 11/6 unchanged basal ganglia hemorrhage 82mm midline shift. Stable ventricular volume  Keep EVD clamped, as per NSG will repeat CT 11/7 to decide on EVD removal  Acute Hypoxemic Respiratory Failure  Intubated  On low dose sedation -> respiratory distress -> increased sedation  Unable to extubate d/t mental status  CCM on board  Likely need trach and PEG  Fever   Temp 100.7->100->afebrile->100.4->101.1->afebrile->101.3->101.5->100.6  CXR - B airspace dz  WBC 10.3->12.9->12.2  UA showed WBC > 50  Urine Cx - difficult collection for pt ESRD   On rocephine 10/31>>11/4  Tylenol PRN  Blood culture pending  CCM on  board  Hypertensive Emergency  BP as high as 194/105 on arrival  Home BP meds: norvasc 10, coreg 25, hydralazine 100 tid, sponolactone 25, minoxidil 5 bid  Now on norvasc 10, coreg 25, hydralazine 100 tid, sponolactone 25, increased minoxidil to 10mg  bid and increase clonidine to  0.2 tid . Goal SBP <140  . Off cleviprex now . Nephrology on board . Long-term BP goal normotensive  Hx stroke/TIA  06/2019 admitted for confusion and hypertensive encephalopathy.  MRI showed numerous bilateral anterior and posterior circulation infarcts throughout.  MRA head and neck unremarkable.  EF > 65%.  LDL 158, A1c 4.9.  UDS positive for cocaine and THC.   Discharged on DAPT  ESRD on HD   MWF schedule  Renal following  Had HD 10/20/19  Anemia d/t CKD, hx duodenitis  Hb 7.1->8.0->7.7->7.4->6.4->6.9->8.3->8.8->7.2->9.2->8.0->8.0->6.2->6.9  Received PRBC transfusion x 2 on 11/2, x 1 on 11/6  EGD in Sept w/ reactive gastropathy and peptic duodenitis  Stool occult blood +  On PPI gtt  GI consult recommended maximizing medical therapy, unable to see much with neonatal scope and not able to perform any therapies via neonatal scope. Recommend transfuse as indicated - they have signed off and call back after extubation  CBC monitoring  Hyperlipidemia  Home Lipid lowering medication: none   LDL 158 in July  Hold statin given hemorrhage   Consider statin on discharge  Dysphagia Malnutrition, moderate   . Secondary to stroke . NPO . On TF @55  via OG  Tobacco abuse  Current smoker  Smoking cessation counseling will be provided   Other Stroke Risk Factors  ETOH abuse; wife reports he quit in July  Hx polysubstance abuse, hx cocaine abuse. UDS neg this admission  Other Active Problems  R index finger edema - no fx, chronic worsening soft tissue swelling  Hyperkalemia 5.6 -> 4.2   Hospital day # 9  This patient is critically ill and at significant risk of neurological  worsening, death and care requires constant monitoring of vital signs, hemodynamics,respiratory and cardiac monitoring, extensive review of multiple databases, frequent neurological assessment, discussion with family, other specialists and medical decision making of high complexity. I spent 45 minutes of neurocritical care time  in the care of  this patient. I had long discussion with wife over the phone, updated pt current condition, treatment plan and potential prognosis, and answered all the questions. She expressed understanding and appreciation, and she requested aggressive care and full code at this time.    Rosalin Hawking, MD PhD Stroke Neurology 10/20/2019 8:30 AM   To contact Stroke Continuity provider, please refer to [email or web address] After hours, contact General Neurology

## 2019-10-20 NOTE — Progress Notes (Signed)
CRITICAL VALUE ALERT  Critical Value:  Hemoglobin 6.9    Date & Time Notied:  10/20/2019 at 1423  Provider Notified: Dr. Halford Chessman  Orders Received/Actions taken: No new orders at this time

## 2019-10-20 NOTE — Progress Notes (Signed)
Pt transported from 4N21 to CT and back with no complications.

## 2019-10-20 NOTE — Progress Notes (Signed)
[facility] Progress Note Patient Name: William [name] DOB: 05/14/1986 MRN: [id number]   Date of Service  10/20/2019  HPI/Events of Note  Notified of H/H 6.2/21. No active bleeding noted. No change in hemodynamics  eICU Interventions  Ordered 1 unit PRBC     Intervention Category Major Interventions: Other:  Judd Lien 10/20/2019, 6:47 AM

## 2019-10-20 NOTE — Progress Notes (Signed)
Patient developed respiratory distress, tachycardia, tachypnea, hypertensive in 220's. wheezing noted. Blood paused. Benadryl given. MD and RT called to bedside. Fentanyl and versed given at 0900 for coughing/fighting ventilator. New orders for propofol gtt, PRN nebulizer. RT bagged and lavaged to remove secretions.

## 2019-10-20 NOTE — Progress Notes (Signed)
NAME:  William [name], MRN:  [id number], DOB:  02/08/1986, LOS: 9 ADMISSION DATE:  09/27/2019, CONSULTATION DATE:  09/27/2019 REFERRING MD:  Ward  CHIEF COMPLAINT:  AMS.   Brief History   33 yo male smoker presented with dyspnea, slurred speech, facial droop, AMS, Lt side weakness.  Found to have very large Rt BG ICH with IVH, cerebral edema and hydrocephalus.  Required intubation for airway.  Had EVD placed.  Past Medical History  CVA, HTN, ESRD, Cocaine abuse, Anemia  Significant Hospital Events   10/28 admit, EVD placed 11/01 transfuse PRBC 11/02 GI consult, start protonix gtt 11/03 attempted EGD >> procedure aborted due to unusual upper airway anatomy 11/05 Tm 101.5, blood cultures sent 11/06 transfuse PRBC  Consults:  Neurosurgery Neprhology  GI >> s/o 11/05  Procedures:  ETT 10/28 >> Left IJ CVL 10/28 >>  Significant Diagnostic Tests:  CT head10/28 >> 6.8 x 3.6 x 4.9 cm Rt BG ICH with IV extension EEG 11/05 >> generalized slowing  Micro Data:  SARS CoV2 10/28 >> negative Blood 11/05 >>   Antimicrobials:  Ceftriaxone 10/31 >> 11/04  Interim history/subjective:  Getting PRBC transfusion.    Objective:  Blood pressure (!) 159/86, pulse 94, temperature 99.3 F (37.4 C), temperature source Oral, resp. rate (!) 26, weight 86 kg, SpO2 100 %.    Vent Mode: PRVC FiO2 (%):  [40 %] 40 % Set Rate:  [18 bmp] 18 bmp Vt Set:  [560 mL] 560 mL PEEP:  [5 cmH20] 5 cmH20 Plateau Pressure:  [16 cmH20-22 cmH20] 20 cmH20   Intake/Output Summary (Last 24 hours) at 10/20/2019 0915 Last data filed at 10/20/2019 0100 Gross per 24 hour  Intake 1776.78 ml  Output -  Net 1776.78 ml   Filed Weights   10/18/19 0500 10/19/19 0500 10/20/19 0500  Weight: 77.8 kg 78.1 kg 86 kg    Examination:  General - on vent Eyes - pupils reactive ENT - ETT in place Cardiac - regular rate/rhythm, no murmur Chest - equal breath sounds b/l, no wheezing or rales Abdomen - soft, non tender, +  bowel sounds Extremities - no cyanosis, clubbing, or edema Skin - no rashes Neuro - opens eyes spontaneously, not following commands  CXR (reviewed by me) - interstitial edema with cardiomegaly  Resolved problems:  UTI  Assessment & Plan:   Acute hypoxic respiratory failure with compromised airway. - pressure support as able - not candidate for extubation given neuro status - will need trach if family wishes to continue aggressive care - f/u CXR intermittently  Fever 11/04. - could be centrally mediated - f/u blood cultures - hold ABx for now  Rt BG ICH with IVH, edema and hydrocephalus s/p EVD. Hx of CVA. - EVD per neurosurgery - neurology following - 3% saline with goal Na 150 to 155  HTN emergency. HLD. - goal SBP < 140  ESRD. Hyperkalemia. - renal replacement per nephrology  Upper GI bleed. - continue protonix  Acute blood loss anemia from Upper GI bleed. Anemia of critical illness, chronic disease. - f/u CBC - transfuse for Hb < 7 or significant bleeding  Acute metabolic encephalopathy. Hx of cocaine abuse >> UDS negative this admission. - RASS goal 0 to -1   Best Practice:  Diet: Continue tube feed DVT prophylaxis: SCDs GI prophylaxis: protonix Mobility: Bedrest Code Status: Full code Disposition: ICU  Labs:   CMP Latest Ref Rng & Units 10/20/2019 10/20/2019 10/19/2019  Glucose 70 - 99 mg/dL 145(H) - -  BUN 6 - 20 mg/dL 111(H) - -  Creatinine 0.61 - 1.24 mg/dL 9.65(H) - -  Sodium 135 - 145 mmol/L 156(H) 155(H) 153(H)  Potassium 3.5 - 5.1 mmol/L 5.6(H) - -  Chloride 98 - 111 mmol/L 119(H) - -  CO2 22 - 32 mmol/L 20(L) - -  Calcium 8.9 - 10.3 mg/dL 9.3 - -  Total Protein 6.5 - 8.1 g/dL - - -  Total Bilirubin 0.3 - 1.2 mg/dL - - -  Alkaline Phos 38 - 126 U/L - - -  AST 15 - 41 U/L - - -  ALT 0 - 44 U/L - - -   CBC Latest Ref Rng & Units 10/20/2019 10/18/2019 10/30/2019  WBC 4.0 - 10.5 K/uL 12.2(H) 12.9(H) 10.3  Hemoglobin 13.0 - 17.0 g/dL  6.2(LL) 8.0(L) 8.0(L)  Hematocrit 39.0 - 52.0 % 21.1(L) 24.9(L) 24.7(L)  Platelets 150 - 400 K/uL 285 323 303   ABG    Component Value Date/Time   PHART 7.408 10/31/2019 1743   PCO2ART 37.3 11/03/2019 1743   PO2ART 102.0 11/04/2019 1743   HCO3 23.2 10/29/2019 1743   TCO2 24 11/11/2019 1743   ACIDBASEDEF 1.0 10/28/2019 1743   O2SAT 98.0 11/12/2019 1743   CBG (last 3)  Recent Labs    10/19/19 2339 10/20/19 0311 10/20/19 0759  GLUCAP 128* 135* 146*    CC time 33 minutes  Chesley Mires, MD [place] 10/20/2019, 9:35 AM

## 2019-10-21 ENCOUNTER — Inpatient Hospital Stay (HOSPITAL_COMMUNITY): Payer: Medicaid Other

## 2019-10-21 DIAGNOSIS — K922 Gastrointestinal hemorrhage, unspecified: Secondary | ICD-10-CM

## 2019-10-21 DIAGNOSIS — L8990 Pressure ulcer of unspecified site, unspecified stage: Secondary | ICD-10-CM | POA: Insufficient documentation

## 2019-10-21 LAB — GLUCOSE, CAPILLARY
Glucose-Capillary: 104 mg/dL — ABNORMAL HIGH (ref 70–99)
Glucose-Capillary: 115 mg/dL — ABNORMAL HIGH (ref 70–99)
Glucose-Capillary: 126 mg/dL — ABNORMAL HIGH (ref 70–99)
Glucose-Capillary: 128 mg/dL — ABNORMAL HIGH (ref 70–99)
Glucose-Capillary: 138 mg/dL — ABNORMAL HIGH (ref 70–99)
Glucose-Capillary: 138 mg/dL — ABNORMAL HIGH (ref 70–99)

## 2019-10-21 LAB — SODIUM
Sodium: 153 mmol/L — ABNORMAL HIGH (ref 135–145)
Sodium: 155 mmol/L — ABNORMAL HIGH (ref 135–145)
Sodium: 156 mmol/L — ABNORMAL HIGH (ref 135–145)
Sodium: 156 mmol/L — ABNORMAL HIGH (ref 135–145)

## 2019-10-21 LAB — RENAL FUNCTION PANEL
Albumin: 2.4 g/dL — ABNORMAL LOW (ref 3.5–5.0)
Anion gap: 15 (ref 5–15)
BUN: 83 mg/dL — ABNORMAL HIGH (ref 6–20)
CO2: 21 mmol/L — ABNORMAL LOW (ref 22–32)
Calcium: 8.9 mg/dL (ref 8.9–10.3)
Chloride: 118 mmol/L — ABNORMAL HIGH (ref 98–111)
Creatinine, Ser: 7.26 mg/dL — ABNORMAL HIGH (ref 0.61–1.24)
GFR calc Af Amer: 10 mL/min — ABNORMAL LOW (ref >60)
GFR calc non Af Amer: 9 mL/min — ABNORMAL LOW (ref >60)
Glucose, Bld: 151 mg/dL — ABNORMAL HIGH (ref 70–99)
Phosphorus: 5.2 mg/dL — ABNORMAL HIGH (ref 2.5–4.6)
Potassium: 4.9 mmol/L (ref 3.5–5.1)
Sodium: 154 mmol/L — ABNORMAL HIGH (ref 135–145)

## 2019-10-21 LAB — CBC
HCT: 21.8 % — ABNORMAL LOW (ref 39.0–52.0)
Hemoglobin: 6.5 g/dL — CL (ref 13.0–17.0)
MCH: 29.1 pg (ref 26.0–34.0)
MCHC: 29.8 g/dL — ABNORMAL LOW (ref 30.0–36.0)
MCV: 97.8 fL (ref 80.0–100.0)
Platelets: 247 10*3/uL (ref 150–400)
RBC: 2.23 MIL/uL — ABNORMAL LOW (ref 4.22–5.81)
RDW: 17.2 % — ABNORMAL HIGH (ref 11.5–15.5)
WBC: 10.1 10*3/uL (ref 4.0–10.5)
nRBC: 0.6 % — ABNORMAL HIGH (ref 0.0–0.2)

## 2019-10-21 LAB — PREPARE RBC (CROSSMATCH)

## 2019-10-21 LAB — TYPE AND SCREEN
ABO/RH(D): O POS
Antibody Screen: NEGATIVE

## 2019-10-21 LAB — TRIGLYCERIDES: Triglycerides: 148 mg/dL (ref <150)

## 2019-10-21 LAB — HEMOGLOBIN AND HEMATOCRIT, BLOOD
HCT: 23.7 % — ABNORMAL LOW (ref 39.0–52.0)
Hemoglobin: 7.5 g/dL — ABNORMAL LOW (ref 13.0–17.0)

## 2019-10-21 MED ORDER — SODIUM CHLORIDE 0.9% IV SOLUTION
Freq: Once | INTRAVENOUS | Status: AC
  Administered 2019-10-21: 10:00:00 via INTRAVENOUS

## 2019-10-21 MED ORDER — CHLORHEXIDINE GLUCONATE CLOTH 2 % EX PADS
6.0000 | MEDICATED_PAD | Freq: Every day | CUTANEOUS | Status: DC
  Administered 2019-10-21 – 2019-10-25 (×4): 6 via TOPICAL

## 2019-10-21 MED ORDER — FENTANYL BOLUS VIA INFUSION
100.0000 ug | Freq: Once | INTRAVENOUS | Status: DC
  Filled 2019-10-21: qty 100

## 2019-10-21 NOTE — Progress Notes (Signed)
Pt didn't not tolerate wean. Pt became extremely SOB, high RR. Placed back on previous mode.  ETT had slightly pulled out to 23 cm at the lip. RRT advanced airway back to 25 cm at the lip with the assistance of MD. RT will continue to monitor.

## 2019-10-21 NOTE — Progress Notes (Signed)
Patient ID: William [name], male   DOB: March 25, 1986, 33 y.o.   MRN: [id number] BP 138/67   Pulse 79   Temp 99.3 F (37.4 C) (Axillary)   Resp (!) 24   Wt 88.2 kg   SpO2 98%   BMI 28.71 kg/m   Head ct reviewed, little if any difference from his last scan.  Reason for pupillary dilation not clear.  Will monitor, open drain.

## 2019-10-21 NOTE — Progress Notes (Signed)
NAME:  William [name], MRN:  [id number], DOB:  12/09/1986, LOS: 10 ADMISSION DATE:  10/12/2019, CONSULTATION DATE:  09/20/2019 REFERRING MD:  Ward  CHIEF COMPLAINT:  AMS.   Brief History   33 yo male smoker presented with dyspnea, slurred speech, facial droop, AMS, Lt side weakness.  Found to have very large Rt BG ICH with IVH, cerebral edema and hydrocephalus.  Required intubation for airway.  Had EVD placed.  Past Medical History  CVA, HTN, ESRD, Cocaine abuse, Anemia  Significant Hospital Events   10/28 admit, EVD placed 11/01 transfuse PRBC 11/02 GI consult, start protonix gtt 11/03 attempted EGD >> procedure aborted due to unusual upper airway anatomy 11/05 Tm 101.5, blood cultures sent 11/06 transfuse PRBC  Consults:  Neurosurgery Neprhology  GI >> s/o 11/05  Procedures:  ETT 10/28 >> Left IJ CVL 10/28 >>  Significant Diagnostic Tests:  CT head10/28 >> 6.8 x 3.6 x 4.9 cm Rt BG ICH with IV extension EEG 11/05 >> generalized slowing  Micro Data:  SARS CoV2 10/28 >> negative Blood 11/05 >>   Antimicrobials:  Ceftriaxone 10/31 >> 11/04  Interim history/subjective:  Some agitation noted this morning Did not tolerate weaning Leak around the tube-tube advanced  Objective:  Blood pressure 138/73, pulse 76, temperature 99 F (37.2 C), temperature source Oral, resp. rate (!) 24, weight 88.2 kg, SpO2 100 %.    Vent Mode: CPAP;PSV FiO2 (%):  [40 %-60 %] 40 % Set Rate:  [18 bmp] 18 bmp Vt Set:  [560 mL] 560 mL PEEP:  [5 cmH20] 5 cmH20 Pressure Support:  [10 cmH20] 10 cmH20 Plateau Pressure:  [15 cmH20-22 cmH20] 15 cmH20   Intake/Output Summary (Last 24 hours) at 10/21/2019 [id number] Last data filed at 10/21/2019 0400 Gross per 24 hour  Intake 3221.94 ml  Output -  Net 3221.94 ml   Filed Weights   10/20/19 0500 10/20/19 0930 10/21/19 0500  Weight: 86 kg 86.2 kg 88.2 kg    Examination:  General -on vent, agitated, Eyes -pupils reactive ENT -endotracheal tube  in place, moist oral mucosa Cardiac -S1-S2 appreciated Chest -rhonchi bilaterally Abdomen -bowel sounds appreciated, soft Extremities -no cyanosis, no clubbing, mild edema Skin - no rashes Neuro -spontaneous eye opening  CXR (reviewed by me) - interstitial edema with cardiomegaly 10/21/19/2020 ABG 11/3 reviewed  Resolved problems:  UTI  Assessment & Plan:   Acute hypoxic respiratory failure with compromised airway Continue pressure support as tolerated-failed this morning Not a candidate for extubation secondary to his mental status Review chest x-ray following endotracheal tube advancement Will require tracheostomy  Fever 11/4, 100.4 on 11/6 Likely centrally mediated Cultures negative to date collected 11/5 Off antibiotics  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, edema and hydrocephalus s/p EVD History of CVA EVD per neurosurgery Neurology following 3% saline with goal sodium of 150-155  Hypertensive emergency Hyperlipidemia Goal SBP less than 140  End-stage renal disease Hyperkalemia Renal replacement per nephrology  Upper GI bleed Continue Protonix drip  Blood loss anemia from upper GI bleed Anemia of critical illness Anemia of chronic disease Trend CBC Transfuse hemoglobin less than 7 Will transfuse 1 unit packed red cells today  Metabolic encephalopathy History of cocaine abuse>> UDS negative this admission RASS goal 0 to -1   Best Practice:  Diet: Continue tube feeds DVT prophylaxis: SCDs GI prophylaxis: Tonics drip Mobility: Bedrest Code Status: Full code Disposition: ICU  Labs:   CMP Latest Ref Rng & Units 10/21/2019 10/21/2019 10/20/2019  Glucose 70 -  99 mg/dL 151(H) - -  BUN 6 - 20 mg/dL 83(H) - -  Creatinine 0.61 - 1.24 mg/dL 7.26(H) - -  Sodium 135 - 145 mmol/L 154(H) 153(H) 152(H)  Potassium 3.5 - 5.1 mmol/L 4.9 - -  Chloride 98 - 111 mmol/L 118(H) - -  CO2 22 - 32 mmol/L 21(L) - -  Calcium 8.9 -  10.3 mg/dL 8.9 - -  Total Protein 6.5 - 8.1 g/dL - - -  Total Bilirubin 0.3 - 1.2 mg/dL - - -  Alkaline Phos 38 - 126 U/L - - -  AST 15 - 41 U/L - - -  ALT 0 - 44 U/L - - -   CBC Latest Ref Rng & Units 10/21/2019 10/20/2019 10/20/2019  WBC 4.0 - 10.5 K/uL 10.1 15.4(H) 12.2(H)  Hemoglobin 13.0 - 17.0 g/dL 6.5(LL) 6.9(LL) 6.2(LL)  Hematocrit 39.0 - 52.0 % 21.8(L) 22.4(L) 21.1(L)  Platelets 150 - 400 K/uL 247 270 285   ABG    Component Value Date/Time   PHART 7.408 11/13/2019 1743   PCO2ART 37.3 10/20/2019 1743   PO2ART 102.0 11/06/2019 1743   HCO3 23.2 11/01/2019 1743   TCO2 24 10/25/2019 1743   ACIDBASEDEF 1.0 10/31/2019 1743   O2SAT 98.0 10/15/2019 1743   CBG (last 3)  Recent Labs    10/21/19 0030 10/21/19 0427 10/21/19 0748  GLUCAP 126* 138* 138*   The patient is critically ill with multiple organ systems failure and requires high complexity decision making for assessment and support, frequent evaluation and titration of therapies, application of advanced monitoring technologies and extensive interpretation of multiple databases. Critical Care Time devoted to patient care services described in this note independent of APP/resident time (if applicable)  is 31 minutes.   Sherrilyn Rist MD [facility] Personal pager: [phone] If unanswered, please page CCM On-call: [phone]

## 2019-10-21 NOTE — Progress Notes (Signed)
Patient ID: William [name], male   DOB: May 05, 1986, 33 y.o.   MRN: [id number] Not responsive. Sedated, intubated No cough, pupils approximately 66mm minimally reactive Will order stat head ct

## 2019-10-21 NOTE — Progress Notes (Signed)
STROKE TEAM PROGRESS NOTE   INTERVAL HISTORY Pt RN and HD nurse at bedside. As per RN, pt had acute agitation, SOB, gasping for air shortly after blood transfusion today. Put back on propofol sedation, gradually pt agitation resolved. Not considered as transfusion reaction. Repeat CT in am stable. EVD continues to be clamped and NSG will repeat CT in am. Pt mental status seems getting worse from yesterday and continued trend of worsening for the last 2-3 days. Discussed with wife over the phone, she would like aggressive care and keep full code. Today he is also posturing.  OBJECTIVE Vitals:   10/21/19 0500 10/21/19 0600 10/21/19 0700 10/21/19 0729  BP: 125/69 132/76 138/73 138/73  Pulse:  77 76 76  Resp: (!) 22 (!) 22 (!) 22 (!) 24  Temp:      TempSrc:      SpO2:  100% 100% 100%  Weight: 88.2 kg       CBC:  Recent Labs  Lab 10/20/19 1345 10/21/19 0407  WBC 15.4* 10.1  HGB 6.9* 6.5*  HCT 22.4* 21.8*  MCV 96.1 97.8  PLT 270 [id number]    Basic Metabolic Panel:  Recent Labs  Lab 10/20/19 1346  10/21/19 0158 10/21/19 0407  NA 149*   < > 153* 154*  K 4.2  --   --  4.9  CL 110  --   --  118*  CO2 23  --   --  21*  GLUCOSE 138*  --   --  151*  BUN 59*  --   --  83*  CREATININE 5.46*  --   --  7.26*  CALCIUM 8.7*  --   --  8.9  PHOS 4.1  --   --  5.2*   < > = values in this interval not displayed.    Lipid Panel:     Component Value Date/Time   CHOL 253 (H) 06/23/2019 0301   TRIG 148 10/21/2019 0407   HDL 67 06/23/2019 0301   CHOLHDL 3.8 06/23/2019 0301   VLDL 28 06/23/2019 0301   LDLCALC 158 (H) 06/23/2019 0301   HgbA1c:  Lab Results  Component Value Date   HGBA1C 4.9 06/23/2019   Urine Drug Screen:     Component Value Date/Time   LABOPIA NONE DETECTED 10/01/2019 0648   COCAINSCRNUR NONE DETECTED 10/04/2019 0648   LABBENZ NONE DETECTED 10/10/2019 0648   AMPHETMU NONE DETECTED 10/07/2019 0648   THCU NONE DETECTED 10/03/2019 0648   LABBARB NONE DETECTED  09/27/2019 0648    Alcohol Level No results found for: ETH  IMAGING Ct Head Wo Contrast 10/20/2019 1. Unchanged large right basal ganglia hemorrhage with lateral and third ventricular extension. Stable swelling and 7 mm of midline shift. Stable ventricular volume. 2. Background chronic small vessel ischemia.   Dg Chest Port 1 View 10/20/2019 1. Mild diffuse bilateral airspace opacification may be due to edema or pneumonia. 2. Improving left lower lobe consolidation.    Ct Head Wo Contrast 10/30/2019 2653 1. No change in size or appearance of a large intraparenchymal hemorrhage centered in the right basal ganglia with extension into the lateral ventricles. 2. Unchanged edema and mass effect with approximately 7 mm right-to-left midline shift. 3. Unchanged caliber and configuration of the lateral ventricles with layering internal blood product and a right frontal approach shunt catheter.   Dg Hand 2 View Right 10/29/2019 Soft tissue enlargement of the proximal right second digit without adjacent osseous or joint abnormality. This was present but smaller  on the 2016 study and is likely benign with a nonaggressive appearance.     CT Head Wo Contrast 10/16/2019 1. No significant interval change in size and appearance of right basal ganglia hemorrhage with intraventricular extension. Stable regional mass effect with up to 7 mm of localized right-to-left midline shift. 2. Stable right frontal approach ventriculostomy with tip terminating near the right thalamus. Stable ventricular size and morphology. 3. Slightly decreased cortical sulcation throughout the brain as compared to previous, suggesting worsened cerebral edema. 4. No other new acute intracranial abnormality.  CT Head Wo Contrast 10/15/2019 1. Unchanged size of large intraparenchymal hematoma centered in the right basal ganglia with intraventricular extension. 2. Decreased size of the lateral ventricles with unchanged 6 mm of leftward  midline shift. 3. No new site of hemorrhage.  Ct Head Wo Contrast 10/13/2019 1. Unchanged right basal ganglia and intraventricular clot.  2. Improved ventriculomegaly.  3. Stable 1 cm leftward midline shift.   Dg Chest Port 1 View 10/13/2019 1. Unremarkable hardware positioning.  2. Improved aeration but persistent dense opacity behind the heart.   Transthoracic Echocardiogram  1. The left ventricle has hyperdynamic systolic function, with an ejection fraction of >65%. The cavity size was decreased. There is severe concentric left ventricular hypertrophy. Left ventricular diastolic parameters were normal. No evidence of left  ventricular regional wall motion abnormalities.  2. The right ventricle has normal systolic function. The cavity was normal. There is no increase in right ventricular wall thickness. Right ventricular systolic pressure could not be assessed.  3. Small pericardial effusion.  4. No evidence of mitral valve stenosis.  5. The aortic valve is tricuspid. No stenosis of the aortic valve.  EEG 10/15/2019 This is an abnormal electroencephalogram secondary to focal right hemispheric slowing superimposed on a diffusely slow background.  Occasional right frontal sharp waves are noted as well.   These findings are consistent with the patient's history of right ICH with right EVD placement.  No subclinical seizure activity is noted.     PHYSICAL EXAM  Temp:  [97.7 F (36.5 C)-100.4 F (38 C)] 99 F (37.2 C) (11/07 0400) Pulse Rate:  [76-118] 76 (11/07 0729) Resp:  [20-33] 24 (11/07 0729) BP: (106-233)/(49-113) 138/73 (11/07 0729) SpO2:  [93 %-100 %] 100 % (11/07 0729) FiO2 (%):  [40 %-60 %] 40 % (11/07 0729) Weight:  [86.2 kg-88.2 kg] 88.2 kg (11/07 0500)  General - Well nourished, well developed, intubated on sedation, mild respiratory distress.   Ophthalmologic - fundi not visualized due to noncooperation.  Cardiovascular - Regular rhythm and tachycardia.  Neuro  - intubated on sedation, intermittently open eyes on voice but not tracking, not following commands, eyes in upper gaze position. Not blinking to visual threat bilaterally, PERRL. Corneal reflex weakly present, gag and cough present. Breathing over the vent.  Facial symmetry not able to test due to ET tube.  Tongue protrusion not cooperative. No withdraw to painful stimulation in all extremities. Extensor posturing in the uppers. No withdrawal in the lowers.  DTR diminished, toes mute, clonus bilateral AJs. Sensation, coordination and gait not tested.   ASSESSMENT/PLAN [name] is a 33 y.o. male with history of hypertension, end-stage renal disease on ASA+Plavix for recent stroke presents to the emergency department obtunded with left-sided plegia and right gaze deviation. CT head showed 60 cc hematoma in the right basal ganglia with intraventricular extension, hydrocephalus, and 1 cm of midline shift. Patient was emergency intubated mainly for airway protection. EVD was emergently placed  at bedside by [place] for hydrocephalus. Patient also was given desmopressin given the fact that he is using Plavix with small studies supporting its use in the setting of antiplatelets diminishing incidence of hemorrhage expansion.   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 Mass effect. 67mm L midline shift. IVH increased in R lateral ventricle atrium. R EVD  Repeat CT head - 10/13/19 - unchanged R basal ganglia ICH and IVH. Improved ventriculomegaly. Stable 1cm midline shift.  [facility] 10/15/19 - Unchanged size of large intraparenchymal hematoma centered in the right basal ganglia with intraventricular extension.Decreased size of the lateral ventricles with unchanged 6 mm of leftward midline shift.  Repeat CT head 11/2 no  significant change in hemorrhage, 93mm midline shift. EVD w/ stable ventricle size. Slight decreased cortical sulcation suggesting worsening edema.  Repeat CT 11/3 no change in hemorrhage or mass effect, 70mm midline shift.EVD. no change in ventricles  Repeat CT head 11/6 - unchanged basal ganglia hemorrhage 41mm midline shift.   2D Echo - EF >65%. No source of embolus   EEG no sz.  Repeat EEG no sz, diffused encephalopathy  [employer] Virus 2  neg  LDL - 158 in July  HgbA1c 4.9 in July    UDS neg  VTE prophylaxis - SCDs only d/t anemia needing transfusion  ASA + Plavix prior to admission, now not indicated  Therapy recommendations:  Pending (need to reorder when ready)  Disposition:  Pending  Discussed with wife today over the phone, wife wants every means of aggressive care (including trach and PEG) and full code.   Cerebral Edema Obstructive hydrocephalus  EVD placed in ED (Ostergard)  Na 145->148->153->155->156->154  Goal Na 150-155  On 3%, (50 cc / hr) will start to taper off after EVD removal   NSG clamped EVD 11/2 - worsening neuro exam - opened  Ventricles remain open. NSG does not feel neuro worsening r/t hydrocephalus  EVD clamp again 11/5  Repeat CT 11/6 unchanged basal ganglia hemorrhage 15mm midline shift. Stable ventricular volume  Keep EVD clamped, as per NSG will repeat CT 11/7 (not yet ordered) to decide on EVD removal  Acute Hypoxemic Respiratory Failure  Intubated  On low dose sedation -> respiratory distress -> increased sedation  Unable to extubate d/t mental status  CCM on board  Likely need trach and PEG  Fever   Temp 100.7->100->afebrile->100.4->101.1->afebrile->101.3->101.5->100.6->100.4->99  CXR - B airspace dz  WBC 10.3->12.9->12.2->15.4->10.1  UA showed WBC > 50  Urine Cx - difficult collection for pt ESRD   On rocephine 10/31>>11/4 - now off abxs  Tylenol PRN  Blood culture pending (no growth 2 days)  CCM on  board  Urine culture - multiple species  Hypertensive Emergency  BP as high as 194/105 on arrival  Home BP meds: norvasc 10, coreg 25, hydralazine 100 tid, sponolactone 25, minoxidil 5 bid  Now on norvasc 10, coreg 25, hydralazine 100 tid, sponolactone 25, increased minoxidil to 10mg  bid and increase clonidine to 0.2 tid . Goal SBP <140  . Off cleviprex now . Nephrology on board . Long-term BP goal normotensive . SBP - 130 range today  Hx stroke/TIA  06/2019 admitted for confusion and hypertensive encephalopathy.  MRI showed numerous bilateral anterior and posterior circulation infarcts throughout.  MRA head and neck  unremarkable.  EF > 65%.  LDL 158, A1c 4.9.  UDS positive for cocaine and THC.   Discharged on DAPT  ESRD on HD   MWF schedule  Renal following  Had HD 10/20/19  Anemia d/t CKD, hx duodenitis  Hb 7.1->8.0->7.7->7.4->6.4->6.9->8.3->8.8->7.2->9.2->8.0->8.0->6.2->6.9->transfused->6.5  Received PRBC transfusion x 2 on 11/2, x 1 on 11/6  EGD in Sept w/ reactive gastropathy and peptic duodenitis  Stool occult blood +  On PPI gtt  GI consult recommended maximizing medical therapy, unable to see much with neonatal scope and not able to perform any therapies via neonatal scope. Recommend transfuse as indicated - they have signed off and call back after extubation  CBC monitoring  Hyperlipidemia  Home Lipid lowering medication: none   LDL 158 in July  Hold statin given hemorrhage   Consider statin on discharge  Dysphagia Malnutrition, moderate   . Secondary to stroke . NPO . On TF @55  via OG  Tobacco abuse  Current smoker  Smoking cessation counseling will be provided   Other Stroke Risk Factors  ETOH abuse; wife reports he quit in July  Hx polysubstance abuse, hx cocaine abuse. UDS neg this admission  Other Active Problems  R index finger edema - no fx, chronic worsening soft tissue swelling  Hyperkalemia 5.6 ->  4.2->4.9    PLAN  May need to repeat urine culture ; although fever and WBCs improved.  Consider transfusing again today and repeating CBC this afternoon, appreciate CCM colleagues for [facility] day # 10  10/21/2019   Personally examined patient and images, and have participated in and made any corrections needed to history, physical, neuro exam,assessment and plan as stated above.  I have personally obtained the history, evaluated lab date, reviewed imaging studies and agree with radiology interpretations.    Sarina Ill, MD Stroke Neurology   This patient is critically ill and at significant risk of neurological worsening, death and care requires constant monitoring of vital signs, hemodynamics,respiratory and cardiac monitoring,review of multiple databases, neurological assessment, discussion with family, other specialists and medical decision making of high complexity.I  I spent 30 minutes of neurocritical care time in the care of this patient.  Sarina Ill, MD [facility]     To contact Stroke Continuity provider, please refer to [email or web address] After hours, contact General Neurology

## 2019-10-21 NOTE — Progress Notes (Signed)
[facility] Progress Note  Subjective: no new issues  Vitals:   10/21/19 1942 10/21/19 2000 10/21/19 2100 10/21/19 2200  BP:  (!) 115/55 (!) 124/59 135/69  Pulse: 80 76 73 73  Resp: (!) 33 (!) 23 20 (!) 21  Temp:      TempSrc:      SpO2: 100% 100% 100% 100%  Weight:        Inpatient medications: . amLODipine  10 mg Per Tube QHS  . carvedilol  25 mg Per Tube BID WC  . chlorhexidine gluconate (MEDLINE KIT)  15 mL Mouth Rinse BID  . Chlorhexidine Gluconate Cloth  6 each Topical Daily  . cloNIDine  0.2 mg Per Tube TID  . [START ON 10/23/2019] darbepoetin (ARANESP) injection - DIALYSIS  200 mcg Intravenous Q Mon-HD  . feeding supplement (PRO-STAT SUGAR FREE 64)  30 mL Per Tube TID  . fentaNYL  100 mcg Intravenous Once  . hydrALAZINE  100 mg Per Tube TID WC  . mouth rinse  15 mL Mouth Rinse 10 times per day  . minoxidil  10 mg Per Tube BID  . senna-docusate  1 tablet Per Tube BID  . sodium chloride flush  10-40 mL Intracatheter Q12H  . spironolactone  25 mg Per Tube Daily   . sodium chloride Stopped (10/16/19 2119)  . feeding supplement (VITAL 1.5 CAL) 55 mL/hr at 10/21/19 0400  . pantoprozole (PROTONIX) infusion 8 mg/hr (10/21/19 2200)  . propofol (DIPRIVAN) infusion 25 mcg/kg/min (10/21/19 2200)  . sodium chloride (hypertonic) 50 mL/hr at 10/21/19 2200   sodium chloride, acetaminophen (TYLENOL) oral liquid 160 mg/5 mL, albuterol, docusate, fentaNYL (SUBLIMAZE) injection, heparin, labetalol, midazolam, sodium chloride flush    Exam: Genunreponsive, intubated, posturing No rash, cyanosis or gangrene Sclera anicteric,throat w/ ETT No jvd or bruits Chest clear bilatto bases RRR no MRG Abd soft ntnd no mass or ascites +bs GU normal male MS no joint effusions or deformity Extno LE edema R TDC/ RUE AVG B+bruit  Home meds: - amlodipine 10/ carvedilol 25 bid/ hydralazine 100 tid/ minoxidil 2.5 bid spironolactone 25 qd - pantoprazole 40 -  atrovastatin 40/ clopidogrel 75 qd - prn's/ vitamins/ supplements  Dialysis Orders: [facility] MWF 4h 50mn 73.5kg 2.2 bath RU AVG (placed 08/15/19) / R TDCHep none - darbe 200 /wk - hect 3 - venofer 100 x 10 , completed  Assessment/Plan: 1. Acute intracranial bleed- w/ midline shift and IVH.s/pEVD. Has been getting IV3% saline per neurology, goal Na+ 150-155. 2. ESRD- usual HD MWF. AVG placed 08/15/19, have been using x 2 wks at OP unit per pt's wife, will continue to use here.Consult [facility] for TDC removal once stable.Recent start in Sept 2020.Have been usinghigherNa+ dialysate levelsto help ^Na+ level. Flat Na+ of 148 seems to be working the best.  3. Anemia ckd- Hbdown today 6.4> 7.0 > 6.9>>8.8>7.2.s/p 2units pRBC on 11/2, Hgb increased to 8.8 and then dropped back to 7.2 this AM.FOBT +. GI has been consultedbut unable to perform EGD (see below).Continuearanesp 200 mcg q Monday.Transfuse prn.  4. HTN/volume - BPat goal, minoxidil increased to 118mBID11/2. Continue cleviprex prn andthe 4 BP meds per NG,goalSBP <160.Weights upagainthis AM, use large UF goals w/ HD. Concerned about poss pulm edema.  Will d/w neurology tomorrow about 3% needs as this is a large volume load on a daily basis.   5. H/o CVA - July 2020 6. UGIB- Dark liquid stool w/o significant response to transfusion. EGD in 08/2019 w/duodenitis w/erosions.Unable to perform  EGD due to swollen tongue. 7. Disposition- poor overall prognosis. Recommend official palliative care consult to help set goals/limits of care as he has not improved.     Rob Doctor, hospital 10/21/2019, 10:54 PM  Iron/TIBC/Ferritin/ %Sat    Component Value Date/Time   IRON 18 (L) 08/18/2019 2358   TIBC 239 (L) 08/18/2019 2358   FERRITIN 266 08/18/2019 2358   IRONPCTSAT 8 (L) 08/18/2019 2358   Recent Labs  Lab 10/21/19 0407  10/21/19 1947  NA 154*   < > 156*  K 4.9  --   --   CL 118*  --   --   CO2 21*  --   --    GLUCOSE 151*  --   --   BUN 83*  --   --   CREATININE 7.26*  --   --   CALCIUM 8.9  --   --   PHOS 5.2*  --   --   ALBUMIN 2.4*  --   --    < > = values in this interval not displayed.   No results for input(s): AST, ALT, ALKPHOS, BILITOT, PROT in the last 168 hours. Recent Labs  Lab 10/21/19 0407 10/21/19 1350  WBC 10.1  --   HGB 6.5* 7.5*  HCT 21.8* 23.7*  PLT 247  --

## 2019-10-22 LAB — SODIUM
Sodium: 157 mmol/L — ABNORMAL HIGH (ref 135–145)
Sodium: 162 mmol/L (ref 135–145)
Sodium: 160 mmol/L — ABNORMAL HIGH (ref 135–145)
Sodium: 158 mmol/L — ABNORMAL HIGH (ref 135–145)

## 2019-10-22 LAB — CBC
HCT: 25.8 % — ABNORMAL LOW (ref 39.0–52.0)
Hemoglobin: 7.7 g/dL — ABNORMAL LOW (ref 13.0–17.0)
MCH: 29.4 pg (ref 26.0–34.0)
MCHC: 29.8 g/dL — ABNORMAL LOW (ref 30.0–36.0)
MCV: 98.5 fL (ref 80.0–100.0)
Platelets: 250 10*3/uL (ref 150–400)
RBC: 2.62 MIL/uL — ABNORMAL LOW (ref 4.22–5.81)
RDW: 17.2 % — ABNORMAL HIGH (ref 11.5–15.5)
WBC: 12.2 10*3/uL — ABNORMAL HIGH (ref 4.0–10.5)
nRBC: 0.6 % — ABNORMAL HIGH (ref 0.0–0.2)

## 2019-10-22 LAB — GLUCOSE, CAPILLARY
Glucose-Capillary: 108 mg/dL — ABNORMAL HIGH (ref 70–99)
Glucose-Capillary: 112 mg/dL — ABNORMAL HIGH (ref 70–99)
Glucose-Capillary: 113 mg/dL — ABNORMAL HIGH (ref 70–99)
Glucose-Capillary: 117 mg/dL — ABNORMAL HIGH (ref 70–99)
Glucose-Capillary: 117 mg/dL — ABNORMAL HIGH (ref 70–99)
Glucose-Capillary: 138 mg/dL — ABNORMAL HIGH (ref 70–99)
Glucose-Capillary: 99 mg/dL (ref 70–99)

## 2019-10-22 LAB — BASIC METABOLIC PANEL
Anion gap: 14 (ref 5–15)
BUN: 121 mg/dL — ABNORMAL HIGH (ref 6–20)
CO2: 19 mmol/L — ABNORMAL LOW (ref 22–32)
Calcium: 9.2 mg/dL (ref 8.9–10.3)
Chloride: 126 mmol/L — ABNORMAL HIGH (ref 98–111)
Creatinine, Ser: 9.67 mg/dL — ABNORMAL HIGH (ref 0.61–1.24)
GFR calc Af Amer: 7 mL/min — ABNORMAL LOW (ref >60)
GFR calc non Af Amer: 6 mL/min — ABNORMAL LOW (ref >60)
Glucose, Bld: 139 mg/dL — ABNORMAL HIGH (ref 70–99)
Potassium: 5.6 mmol/L — ABNORMAL HIGH (ref 3.5–5.1)
Sodium: 159 mmol/L — ABNORMAL HIGH (ref 135–145)

## 2019-10-22 LAB — TRIGLYCERIDES: Triglycerides: 233 mg/dL — ABNORMAL HIGH (ref <150)

## 2019-10-22 MED ORDER — CHLORHEXIDINE GLUCONATE CLOTH 2 % EX PADS
6.0000 | MEDICATED_PAD | Freq: Every day | CUTANEOUS | Status: DC
  Administered 2019-10-22: 18:00:00 6 via TOPICAL

## 2019-10-22 MED ORDER — CLEVIDIPINE BUTYRATE 0.5 MG/ML IV EMUL
0.0000 mg/h | INTRAVENOUS | Status: DC
  Administered 2019-10-22: 17:00:00 16 mg/h via INTRAVENOUS
  Administered 2019-10-22: 21:00:00 4 mg/h via INTRAVENOUS
  Administered 2019-10-22: 17:00:00 18 mg/h via INTRAVENOUS
  Administered 2019-10-22: 03:00:00 1 mg/h via INTRAVENOUS
  Administered 2019-10-22: 4 mg/h via INTRAVENOUS
  Filled 2019-10-22 (×6): qty 50

## 2019-10-22 NOTE — Progress Notes (Signed)
[facility] Progress Note  Subjective: Na up  163  Vitals:   10/22/19 0738 10/22/19 0800 10/22/19 0900 10/22/19 1000  BP: (!) 149/72 (!) 158/80 (!) 153/72 119/69  Pulse: 84 83 84 77  Resp: (!) 26 (!) 27 (!) 34 (!) 21  Temp:      TempSrc:      SpO2: 100% 98% 98% 97%  Weight:        Inpatient medications: . amLODipine  10 mg Per Tube QHS  . carvedilol  25 mg Per Tube BID WC  . chlorhexidine gluconate (MEDLINE KIT)  15 mL Mouth Rinse BID  . Chlorhexidine Gluconate Cloth  6 each Topical Daily  . Chlorhexidine Gluconate Cloth  6 each Topical Q0600  . cloNIDine  0.2 mg Per Tube TID  . [START ON 10/23/2019] darbepoetin (ARANESP) injection - DIALYSIS  200 mcg Intravenous Q Mon-HD  . feeding supplement (PRO-STAT SUGAR FREE 64)  30 mL Per Tube TID  . fentaNYL  100 mcg Intravenous Once  . hydrALAZINE  100 mg Per Tube TID WC  . mouth rinse  15 mL Mouth Rinse 10 times per day  . minoxidil  10 mg Per Tube BID  . senna-docusate  1 tablet Per Tube BID  . sodium chloride flush  10-40 mL Intracatheter Q12H  . spironolactone  25 mg Per Tube Daily   . sodium chloride Stopped (10/16/19 2119)  . clevidipine 4 mg/hr (10/22/19 0644)  . feeding supplement (VITAL 1.5 CAL) 55 mL/hr at 10/21/19 0400  . pantoprozole (PROTONIX) infusion 8 mg/hr (10/22/19 0400)  . propofol (DIPRIVAN) infusion 25 mcg/kg/min (10/22/19 0400)  . sodium chloride (hypertonic) 50 mL/hr at 10/22/19 0400   sodium chloride, acetaminophen (TYLENOL) oral liquid 160 mg/5 mL, albuterol, docusate, fentaNYL (SUBLIMAZE) injection, heparin, labetalol, midazolam, sodium chloride flush    Exam: Genunreponsive, intubated No rash, cyanosis or gangrene Sclera anicteric,throat w/ ETT No jvd or bruits Chest clear bilatto bases RRR no MRG Abd soft ntnd no mass or ascites +bs GU normal male  MS no joint effusions or deformity Ext diffuse 2+ edema UE > LE R TDC/ RUE AVG B+bruit  Home meds: - amlodipine 10/  carvedilol 25 bid/ hydralazine 100 tid/ minoxidil 2.5 bid spironolactone 25 qd - pantoprazole 40 - atrovastatin 40/ clopidogrel 75 qd - prn's/ vitamins/ supplements  Dialysis Orders: [facility] MWF 4h 54mn 73.5kg 2.2 bath RU AVG (placed 08/15/19) / R TDCHep none - darbe 200 /wk - hect 3 - venofer 100 x 10 , completed  Assessment/Plan: 1. Acute intracranial bleed- w/ midline shift and IVH.s/pEVD. Has been getting IV3% saline per neurology, goal Na+ 150-155. Over goal this am.  2. ESRD- usual HD MWF. AVG placed 08/15/19, was using at center, using here w/o issue. Consult VVS for TDC removal once more stable. Using high Na+ levels 140- 148 to assist in Na+ goal 150-155 3. Vol excess: due to 3% vol loading mostly, will ask pharm for assist w/ lowering this, if not will need extra HD.   4. Anemia ckd- Hbdown today 6.4> 7.0 > 6.9>>8.8>7.2.s/p 2units pRBC on 11/2, Hgb increased to 8.8 and then dropped back to 7.2 this AM.FOBT +. GI has been consultedbut unable to perform EGD (see below).Continuearanesp 200 mcg q Monday.Transfuse prn.  5. HTN - BPat goal, minoxidil increased to 181mBID11/2. Continue cleviprex prn andthe 4 BP meds per NG,goalSBP < 160 6. H/o CVA - July 2020 7. UGIB- Dark liquid stool w/o significant response to transfusion. EGD in 08/2019  w/duodenitis w/erosions.Unable to perform EGD due to swollen tongue. 8. Disposition- poor overall prognosis. Recommend official palliative care consult to help set goals/limits of care as he has not improved.     Rob Lena Fieldhouse 10/22/2019, 10:42 AM  Iron/TIBC/Ferritin/ %Sat    Component Value Date/Time   IRON 18 (L) 08/18/2019 2358   TIBC 239 (L) 08/18/2019 2358   FERRITIN 266 08/18/2019 2358   IRONPCTSAT 8 (L) 08/18/2019 2358   Recent Labs  Lab 10/21/19 0407  10/22/19 0934  NA 154*   < > 160*  K 4.9  --   --   CL 118*  --   --   CO2 21*  --   --   GLUCOSE 151*  --   --   BUN 83*  --   --   CREATININE  7.26*  --   --   CALCIUM 8.9  --   --   PHOS 5.2*  --   --   ALBUMIN 2.4*  --   --    < > = values in this interval not displayed.   No results for input(s): AST, ALT, ALKPHOS, BILITOT, PROT in the last 168 hours. Recent Labs  Lab 10/21/19 0407 10/21/19 1350  WBC 10.1  --   HGB 6.5* 7.5*  HCT 21.8* 23.7*  PLT 247  --

## 2019-10-22 NOTE — Progress Notes (Signed)
NAME:  William [name], MRN:  [id number], DOB:  [birth year], LOS: 11 ADMISSION DATE:  09/24/2019, CONSULTATION DATE:  09/26/2019 REFERRING MD:  Ward  CHIEF COMPLAINT:  AMS.   Brief History   33 yo male smoker presented with dyspnea, slurred speech, facial droop, AMS, Lt side weakness.  Found to have very large Rt BG ICH with IVH, cerebral edema and hydrocephalus.  Required intubation for airway.  Had EVD placed.  Past Medical History  CVA, HTN, ESRD, Cocaine abuse, Anemia  Significant Hospital Events   10/28 admit, EVD placed 11/01 transfuse PRBC 11/02 GI consult, start protonix gtt 11/03 attempted EGD >> procedure aborted due to unusual upper airway anatomy 11/05 Tm 101.5, blood cultures sent 11/06 transfuse PRBC  Consults:  Neurosurgery Neprhology  GI >> s/o 11/05  Procedures:  ETT 10/28 >> Left IJ CVL 10/28 >>  Significant Diagnostic Tests:  CT head10/28 >> 6.8 x 3.6 x 4.9 cm Rt BG ICH with IV extension EEG 11/05 >> generalized slowing  Micro Data:  SARS CoV2 10/28 >> negative Blood 11/05 >>   Antimicrobials:  Ceftriaxone 10/31 >> 11/04  Interim history/subjective:  Some agitation noted this morning Tolerating weaning better today-mental status still precludes extubation  Objective:  Blood pressure (!) 149/72, pulse 84, temperature 98.8 F (37.1 C), temperature source Axillary, resp. rate (!) 26, weight 89.6 kg, SpO2 100 %.    Vent Mode: PSV;CPAP FiO2 (%):  [40 %] 40 % Set Rate:  [18 bmp] 18 bmp Vt Set:  [560 mL] 560 mL PEEP:  [5 cmH20] 5 cmH20 Pressure Support:  [10 cmH20] 10 cmH20 Plateau Pressure:  [11 cmH20-25 cmH20] 21 cmH20   Intake/Output Summary (Last 24 hours) at 10/22/2019 0919 Last data filed at 10/22/2019 0400 Gross per 24 hour  Intake 1626.1 ml  Output -  Net 1626.1 ml   Filed Weights   10/20/19 0930 10/21/19 0500 10/22/19 0500  Weight: 86.2 kg 88.2 kg 89.6 kg    Examination:  General -on vent, comfortable Eyes -pupils reactive  ENT  -moist oral mucosa, endotracheal tube in place Cardiac -S2 appreciated Chest -bilateral rhonchi Abdomen -bowel sounds appreciated Extremities -mild extremity edema  skin - no rashes  Neuro -spontaneous eye opening  CXR - interstitial edema with cardiomegaly 10/21/19/2020 ABG 11/3 reviewed  Resolved problems:  UTI  Assessment & Plan:   Acute hypoxemic respiratory failure Pressure products tolerated, not extubation candidate at present secondary to mental status Will likely require tracheostomy   Fever 11/4, 100.4 , current T-max 99.6 Likely centrally mediated Negative cultures, off antibiotics  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, edema and hydrocephalus s/p EVD History of CVA EVD per neurosurgery Neurology following Hypernatremia-was on 3% saline  Hypertensive emergency Hyperlipidemia Goal systolic blood pressure less than 140  End-stage renal disease Renal replacement therapy per nephrology  Upper GI bleed Anemia of critical illness Anemia of chronic disease Continue Protonix drip We will transfuse if hematocrit less than 7  Metabolic encephalopathy History of cocaine abuse>> UDS negative this admission RASS goal 0 to -1   Best Practice:  Diet: Continue tube feeds DVT prophylaxis: SCDs GI prophylaxis: Protonix Mobility: Bedrest Code Status: Full code Disposition: ICU  Labs:   CMP Latest Ref Rng & Units 10/22/2019 10/22/2019 10/21/2019  Glucose 70 - 99 mg/dL - - -  BUN 6 - 20 mg/dL - - -  Creatinine 0.61 - 1.24 mg/dL - - -  Sodium 135 - 145 mmol/L 162(HH) 157(H) 156(H)  Potassium 3.5 -  5.1 mmol/L - - -  Chloride 98 - 111 mmol/L - - -  CO2 22 - 32 mmol/L - - -  Calcium 8.9 - 10.3 mg/dL - - -  Total Protein 6.5 - 8.1 g/dL - - -  Total Bilirubin 0.3 - 1.2 mg/dL - - -  Alkaline Phos 38 - 126 U/L - - -  AST 15 - 41 U/L - - -  ALT 0 - 44 U/L - - -   CBC Latest Ref Rng & Units 10/21/2019 10/21/2019 10/20/2019  WBC  4.0 - 10.5 K/uL - 10.1 15.4(H)  Hemoglobin 13.0 - 17.0 g/dL 7.5(L) 6.5(LL) 6.9(LL)  Hematocrit 39.0 - 52.0 % 23.7(L) 21.8(L) 22.4(L)  Platelets 150 - 400 K/uL - 247 270   ABG    Component Value Date/Time   PHART 7.408 10/16/2019 1743   PCO2ART 37.3 10/25/2019 1743   PO2ART 102.0 10/16/2019 1743   HCO3 23.2 11/05/2019 1743   TCO2 24 10/24/2019 1743   ACIDBASEDEF 1.0 11/06/2019 1743   O2SAT 98.0 10/19/2019 1743   CBG (last 3)  Recent Labs    10/22/19 0001 10/22/19 0403 10/22/19 0725  GLUCAP 138* 117* 112*   The patient is critically ill with multiple organ systems failure and requires high complexity decision making for assessment and support, frequent evaluation and titration of therapies, application of advanced monitoring technologies and extensive interpretation of multiple databases. Critical Care Time devoted to patient care services described in this note independent of APP/resident time (if applicable)  is 32 minutes.   Sherrilyn Rist MD [facility] Personal pager: [phone] If unanswered, please page CCM On-call: [phone]

## 2019-10-22 NOTE — Progress Notes (Signed)
RN spoke with on call Neuro MD regarding systolic BP 0000000.  MD ordered that we place the patient on a cleviprex gtt.

## 2019-10-22 NOTE — Progress Notes (Signed)
Patient ID: William [name], male   DOB: Feb 21, 1986, 33 y.o.   MRN: [id number] BP (!) 145/67   Pulse 85   Temp 98.9 F (37.2 C) (Axillary)   Resp (!) 24   Wt 89.6 kg   SpO2 97%   BMI 29.17 kg/m  Eyes open , not following commands Pupils reactive Did not see posturing

## 2019-10-22 NOTE — Progress Notes (Signed)
STROKE TEAM PROGRESS NOTE   INTERVAL HISTORY  Stat CT stable overnight, stable.  EVD unclamped, worsening neuro exam. Neurologically declines, uppers posturing, lowers triple flex.wifewould like aggressive care and keep full code.   OBJECTIVE Vitals:   10/22/19 0430 10/22/19 0445 10/22/19 0500 10/22/19 0600  BP: 140/66 (!) 119/49 (!) 125/57 (!) 174/75  Pulse: 79 74 73 78  Resp: (!) 23 (!) 21 20 (!) 22  Temp:      TempSrc:      SpO2: 97% 97% 97% 98%  Weight:   89.6 kg     CBC:  Recent Labs  Lab 10/20/19 1345 10/21/19 0407 10/21/19 1350  WBC 15.4* 10.1  --   HGB 6.9* 6.5* 7.5*  HCT 22.4* 21.8* 23.7*  MCV 96.1 97.8  --   PLT 270 247  --     Basic Metabolic Panel:  Recent Labs  Lab 10/20/19 1346  10/21/19 0407  10/21/19 1947 10/22/19 0150  NA 149*   < > 154*   < > 156* 157*  K 4.2  --  4.9  --   --   --   CL 110  --  118*  --   --   --   CO2 23  --  21*  --   --   --   GLUCOSE 138*  --  151*  --   --   --   BUN 59*  --  83*  --   --   --   CREATININE 5.46*  --  7.26*  --   --   --   CALCIUM 8.7*  --  8.9  --   --   --   PHOS 4.1  --  5.2*  --   --   --    < > = values in this interval not displayed.    Lipid Panel:     Component Value Date/Time   CHOL 253 (H) 06/23/2019 0301   TRIG 233 (H) 10/22/2019 0356   HDL 67 06/23/2019 0301   CHOLHDL 3.8 06/23/2019 0301   VLDL 28 06/23/2019 0301   LDLCALC 158 (H) 06/23/2019 0301   HgbA1c:  Lab Results  Component Value Date   HGBA1C 4.9 06/23/2019   Urine Drug Screen:     Component Value Date/Time   LABOPIA NONE DETECTED 09/28/2019 0648   COCAINSCRNUR NONE DETECTED 09/17/2019 0648   LABBENZ NONE DETECTED 09/29/2019 0648   AMPHETMU NONE DETECTED 10/10/2019 0648   THCU NONE DETECTED 09/21/2019 0648   LABBARB NONE DETECTED 10/02/2019 0648    Alcohol Level No results found for: ETH  IMAGING  Ct Head Wo Contrast 10/20/2019 1. Unchanged large right basal ganglia hemorrhage with lateral and third ventricular  extension. Stable swelling and 7 mm of midline shift. Stable ventricular volume. 2. Background chronic small vessel ischemia.   CT Head WO Contrast 10/21/2019 IMPRESSION: 1. Stable large right basal ganglia hemorrhage with intraventricular extension since 10/20/2019. Stable regional edema and intracranial mass effect with leftward midline shift of 8-9 mm. 2. Stable right EVD which appears to communicate with the right lateral ventricle. Stable ventricle size and configuration. 3. No new intracranial abnormality.  Ct Head Wo Contrast 10/31/2019 2653 1. No change in size or appearance of a large intraparenchymal hemorrhage centered in the right basal ganglia with extension into the lateral ventricles. 2. Unchanged edema and mass effect with approximately 7 mm right-to-left midline shift. 3. Unchanged caliber and configuration of the lateral ventricles with layering internal blood  product and a right frontal approach shunt catheter.   CT Head Wo Contrast 10/16/2019 1. No significant interval change in size and appearance of right basal ganglia hemorrhage with intraventricular extension. Stable regional mass effect with up to 7 mm of localized right-to-left midline shift. 2. Stable right frontal approach ventriculostomy with tip terminating near the right thalamus. Stable ventricular size and morphology. 3. Slightly decreased cortical sulcation throughout the brain as compared to previous, suggesting worsened cerebral edema. 4. No other new acute intracranial abnormality.  CT Head Wo Contrast 10/15/2019 1. Unchanged size of large intraparenchymal hematoma centered in the right basal ganglia with intraventricular extension. 2. Decreased size of the lateral ventricles with unchanged 6 mm of leftward midline shift. 3. No new site of hemorrhage.  Ct Head Wo Contrast 10/13/2019 1. Unchanged right basal ganglia and intraventricular clot.  2. Improved ventriculomegaly.  3. Stable 1 cm leftward midline  shift.   Dg Chest Port 1 View 10/20/2019 1. Mild diffuse bilateral airspace opacification may be due to edema or pneumonia. 2. Improving left lower lobe consolidation.   Dg Hand 2 View Right 11/01/2019 Soft tissue enlargement of the proximal right second digit without adjacent osseous or joint abnormality. This was present but smaller on the 2016 study and is likely benign with a nonaggressive appearance.     Dg Chest Port 1 View 10/13/2019 1. Unremarkable hardware positioning.  2. Improved aeration but persistent dense opacity behind the heart.   Transthoracic Echocardiogram  1. The left ventricle has hyperdynamic systolic function, with an ejection fraction of >65%. The cavity size was decreased. There is severe concentric left ventricular hypertrophy. Left ventricular diastolic parameters were normal. No evidence of left  ventricular regional wall motion abnormalities.  2. The right ventricle has normal systolic function. The cavity was normal. There is no increase in right ventricular wall thickness. Right ventricular systolic pressure could not be assessed.  3. Small pericardial effusion.  4. No evidence of mitral valve stenosis.  5. The aortic valve is tricuspid. No stenosis of the aortic valve.  EEG 10/15/2019 This is an abnormal electroencephalogram secondary to focal right hemispheric slowing superimposed on a diffusely slow background.  Occasional right frontal sharp waves are noted as well.   These findings are consistent with the patient's history of right ICH with right EVD placement.  No subclinical seizure activity is noted.     PHYSICAL EXAM  Temp:  [98.5 F (36.9 C)-99.8 F (37.7 C)] 98.8 F (37.1 C) (11/08 0400) Pulse Rate:  [72-85] 78 (11/08 0600) Resp:  [20-33] 22 (11/08 0600) BP: (112-174)/(49-86) 174/75 (11/08 0600) SpO2:  [93 %-100 %] 98 % (11/08 0600) FiO2 (%):  [40 %] 40 % (11/08 0335) Weight:  [89.6 kg] 89.6 kg (11/08 0500)  General - Well nourished,  well developed, intubated on sedation, mild respiratory distress.   Ophthalmologic - fundi not visualized due to noncooperation.  Cardiovascular - Regular rhythm and tachycardia.  Neuro - intubated on sedation, intermittently open eyes on voice but not tracking, not following commands, eyes in upper gaze position. Not blinking to visual threat bilaterally, PERRL. Corneal reflex weakly present, gag and cough present. Breathing over the vent.  Facial symmetry not able to test due to ET tube.  Tongue protrusion not cooperative.  Extensor posturing in the uppers. trople flex in the lowers possible some myoclonus on pain stim lowers.  DTR diminished, toes mute, clonus bilateral AJs. Sensation, coordination and gait not tested.   ASSESSMENT/PLAN Mr. Garnell Exner is a  33 y.o. male with history of hypertension, end-stage renal disease on ASA+Plavix for recent stroke presents to the emergency department obtunded with left-sided plegia and right gaze deviation. CT head showed 60 cc hematoma in the right basal ganglia with intraventricular extension, hydrocephalus, and 1 cm of midline shift. Patient was emergency intubated mainly for airway protection. EVD was emergently placed at bedside by Collins for hydrocephalus. Patient also was given desmopressin given the fact that he is using Plavix with small studies supporting its use in the setting of antiplatelets diminishing incidence of hemorrhage expansion.   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 Mass effect. 9mm L midline shift. IVH increased in R lateral ventricle atrium. R EVD  Repeat CT head - 10/13/19 - unchanged R basal ganglia ICH and IVH. Improved ventriculomegaly. Stable 1cm midline shift.  [facility] 10/15/19 - Unchanged size of large intraparenchymal hematoma  centered in the right basal ganglia with intraventricular extension.Decreased size of the lateral ventricles with unchanged 6 mm of leftward midline shift.  Repeat CT head 11/2 no significant change in hemorrhage, 19mm midline shift. EVD w/ stable ventricle size. Slight decreased cortical sulcation suggesting worsening edema.  Repeat CT 11/3 no change in hemorrhage or mass effect, 7mm midline shift.EVD. no change in ventricles  Repeat CT head 11/6 - unchanged basal ganglia hemorrhage 13mm midline shift.   CT Head WO Contrast - 10/21/2019 - stable (see above) leftward midline shift of 8-9 mm.  2D Echo - EF >65%. No source of embolus   EEG no sz.  Repeat EEG no sz, diffused encephalopathy  [employer] Virus 2  neg  LDL - 158 in July  HgbA1c 4.9 in July    UDS neg  VTE prophylaxis - SCDs only d/t anemia needing transfusion  ASA + Plavix prior to admission, now not indicated  Therapy recommendations:  Pending (need to reorder when ready)  Disposition:  Pending  Discussed with wife today over the phone, wife wants every means of aggressive care (including trach and PEG) and full code.   Cerebral Edema Obstructive hydrocephalus  EVD placed in ED (Ostergard)  Na 145->148->153->155->156->154->157  Goal Na 150-155  On 3%, (50 cc / hr) will start to taper off after EVD removal   Neurosurgery on board  Ventricles remain open. NSG does not feel neuro worsening r/t hydrocephalus  EVD clamp again 11/5 - opened   Repeat CT 11/6 unchanged basal ganglia hemorrhage 4mm midline shift. Stable ventricular volume  EVD clamped -> opened 11/7 per Dr Christella Noa - worsening neuro exam  CT Head WO Contrast - 10/21/2019 - stable (see above) leftward midline shift of 8-9 mm.  Acute Hypoxemic Respiratory Failure  Intubated  On low dose sedation -> respiratory distress -> increased sedation  Unable to extubate d/t mental status  CCM on board  Likely need trach and PEG  Fever   Temp  100.7->100->afebrile->100.4->101.1->afebrile->101.3->101.5->100.6->100.4->99->99.8  CXR - B airspace dz  WBC 10.3->12.9->12.2->15.4->10.1  UA showed WBC > 50  Urine Cx - difficult collection for pt ESRD   On rocephine 10/31>>11/4 - now off abxs  Tylenol PRN  Blood culture pending (no growth 2 days) - pending  CCM on board  Urine culture - multiple species  Hypertensive Emergency  BP as high as 194/105 on arrival  Home BP meds:  norvasc 10, coreg 25, hydralazine 100 tid, sponolactone 25, minoxidil 5 bid  Now on norvasc 10, coreg 25, hydralazine 100 tid, sponolactone 25, minoxidil increased to 10mg  bid and clonidine  Increased to 0.2 tid (10/20/19) . Goal SBP <140  . Off cleviprex now . Nephrology on board . Long-term BP goal normotensive . SBP - 120s - 170s today - monitor closely - may need to increase clonidine  Hx stroke/TIA  06/2019 admitted for confusion and hypertensive encephalopathy.  MRI showed numerous bilateral anterior and posterior circulation infarcts throughout.  MRA head and neck unremarkable.  EF > 65%.  LDL 158, A1c 4.9.  UDS positive for cocaine and THC.   Discharged on DAPT  ESRD on HD   MWF schedule  Renal following  Had HD 10/20/19  Anemia d/t CKD, hx duodenitis  Hb 7.1->8.0->7.7->7.4->6.4->6.9->8.3->8.8->7.2->9.2->8.0->8.0->6.2->6.9->transfused->6.5->transfused->7.5  Received PRBC transfusion x 2 on 11/2, x 1 on 11/6 and 11/7  EGD in Sept w/ reactive gastropathy and peptic duodenitis  Stool occult blood +  On PPI gtt  GI consult recommended maximizing medical therapy, unable to see much with neonatal scope and not able to perform any therapies via neonatal scope. Recommend transfuse as indicated - they have signed off and call back after extubation  CBC monitoring  Hyperlipidemia  Home Lipid lowering medication: none   LDL 158 in July  Hold statin given hemorrhage   Consider statin on discharge  Dysphagia Malnutrition, moderate    . Secondary to stroke . NPO . On TF @55  via OG  Tobacco abuse  Current smoker  Smoking cessation counseling will be provided   Other Stroke Risk Factors  ETOH abuse; wife reports he quit in July  Hx polysubstance abuse, hx cocaine abuse. UDS neg this admission  Other Active Problems  R index finger edema - no fx, chronic worsening soft tissue swelling  Hyperkalemia 5.6 -> 4.2->4.9    PLAN  May need to repeat urine culture ; although fever and WBCs improved.  Check labs Monday    Hospital day # 11  10/22/2019   Personally examined patient and images, and have participated in and made any corrections needed to history, physical, neuro exam,assessment and plan as stated above.  I have personally obtained the history, evaluated lab date, reviewed imaging studies and agree with radiology interpretations.   This patient is critically ill and at significant risk of neurological worsening, death and care requires constant monitoring of vital signs, hemodynamics,respiratory and cardiac monitoring,review of multiple databases, neurological assessment, discussion with family, other specialists and medical decision making of high complexity.I  I spent 30 minutes of neurocritical care time in the care of this patient.      To contact Stroke Continuity provider, please refer to [email or web address] After hours, contact General Neurology

## 2019-10-23 DIAGNOSIS — N186 End stage renal disease: Secondary | ICD-10-CM

## 2019-10-23 DIAGNOSIS — K2971 Gastritis, unspecified, with bleeding: Secondary | ICD-10-CM

## 2019-10-23 DIAGNOSIS — Z9911 Dependence on respirator [ventilator] status: Secondary | ICD-10-CM

## 2019-10-23 LAB — TYPE AND SCREEN
ABO/RH(D): O POS
Antibody Screen: NEGATIVE
Unit division: 0
Unit division: 0

## 2019-10-23 LAB — CBC
HCT: 26.6 % — ABNORMAL LOW (ref 39.0–52.0)
Hemoglobin: 7.9 g/dL — ABNORMAL LOW (ref 13.0–17.0)
MCH: 29.6 pg (ref 26.0–34.0)
MCHC: 29.7 g/dL — ABNORMAL LOW (ref 30.0–36.0)
MCV: 99.6 fL (ref 80.0–100.0)
Platelets: 264 10*3/uL (ref 150–400)
RBC: 2.67 MIL/uL — ABNORMAL LOW (ref 4.22–5.81)
RDW: 17.3 % — ABNORMAL HIGH (ref 11.5–15.5)
WBC: 12.3 10*3/uL — ABNORMAL HIGH (ref 4.0–10.5)
nRBC: 0.3 % — ABNORMAL HIGH (ref 0.0–0.2)

## 2019-10-23 LAB — GLUCOSE, CAPILLARY
Glucose-Capillary: 116 mg/dL — ABNORMAL HIGH (ref 70–99)
Glucose-Capillary: 126 mg/dL — ABNORMAL HIGH (ref 70–99)
Glucose-Capillary: 117 mg/dL — ABNORMAL HIGH (ref 70–99)
Glucose-Capillary: 121 mg/dL — ABNORMAL HIGH (ref 70–99)
Glucose-Capillary: 124 mg/dL — ABNORMAL HIGH (ref 70–99)
Glucose-Capillary: 107 mg/dL — ABNORMAL HIGH (ref 70–99)

## 2019-10-23 LAB — BASIC METABOLIC PANEL
Anion gap: 16 — ABNORMAL HIGH (ref 5–15)
BUN: 137 mg/dL — ABNORMAL HIGH (ref 6–20)
CO2: 18 mmol/L — ABNORMAL LOW (ref 22–32)
Calcium: 9.4 mg/dL (ref 8.9–10.3)
Chloride: 123 mmol/L — ABNORMAL HIGH (ref 98–111)
Creatinine, Ser: 10.99 mg/dL — ABNORMAL HIGH (ref 0.61–1.24)
GFR calc Af Amer: 6 mL/min — ABNORMAL LOW (ref >60)
GFR calc non Af Amer: 5 mL/min — ABNORMAL LOW (ref >60)
Glucose, Bld: 129 mg/dL — ABNORMAL HIGH (ref 70–99)
Potassium: 6 mmol/L — ABNORMAL HIGH (ref 3.5–5.1)
Sodium: 157 mmol/L — ABNORMAL HIGH (ref 135–145)

## 2019-10-23 LAB — BPAM RBC
Blood Product Expiration Date: 202012092359
Blood Product Expiration Date: 202012112359
ISSUE DATE / TIME: 202011060802
ISSUE DATE / TIME: 202011070908
Unit Type and Rh: 5100
Unit Type and Rh: 5100

## 2019-10-23 LAB — TRIGLYCERIDES: Triglycerides: 150 mg/dL — ABNORMAL HIGH (ref <150)

## 2019-10-23 LAB — SODIUM
Sodium: 159 mmol/L — ABNORMAL HIGH (ref 135–145)
Sodium: 149 mmol/L — ABNORMAL HIGH (ref 135–145)
Sodium: 148 mmol/L — ABNORMAL HIGH (ref 135–145)

## 2019-10-23 MED ORDER — DARBEPOETIN ALFA 200 MCG/0.4ML IJ SOSY
PREFILLED_SYRINGE | INTRAMUSCULAR | Status: AC
  Administered 2019-10-23: 200 ug via INTRAVENOUS
  Filled 2019-10-23: qty 0.4

## 2019-10-23 MED ORDER — CHLORHEXIDINE GLUCONATE CLOTH 2 % EX PADS
6.0000 | MEDICATED_PAD | Freq: Every day | CUTANEOUS | Status: DC
  Administered 2019-10-24: 6 via TOPICAL

## 2019-10-23 NOTE — Progress Notes (Signed)
[facility] Progress Note  Subjective: K 6.0, bun 137  Creat 11  Vitals:   10/23/19 1015 10/23/19 1030 10/23/19 1045 10/23/19 1100  BP: (!) 154/92 (!) 174/90 (!) 150/83 (!) 150/78  Pulse: 82 96 80 78  Resp: (!) 21 (!) _0 Temp:      TempSrc:      SpO2: 100% 100% 100% 100%  Weight:        Inpatient medications: . amLODipine  10 mg Per Tube QHS  . carvedilol  25 mg Per Tube BID WC  . chlorhexidine gluconate (MEDLINE KIT)  15 mL Mouth Rinse BID  . Chlorhexidine Gluconate Cloth  6 each Topical Daily  . Chlorhexidine Gluconate Cloth  6 each Topical Q0600  . cloNIDine  0.2 mg Per Tube TID  . darbepoetin (ARANESP) injection - DIALYSIS  200 mcg Intravenous Q Mon-HD  . feeding supplement (PRO-STAT SUGAR FREE 64)  30 mL Per Tube TID  . fentaNYL  100 mcg Intravenous Once  . hydrALAZINE  100 mg Per Tube TID WC  . mouth rinse  15 mL Mouth Rinse 10 times per day  . minoxidil  10 mg Per Tube BID  . senna-docusate  1 tablet Per Tube BID  . sodium chloride flush  10-40 mL Intracatheter Q12H  . spironolactone  25 mg Per Tube Daily   . sodium chloride Stopped (10/16/19 2119)  . clevidipine Stopped (10/22/19 2234)  . feeding supplement (VITAL 1.5 CAL) 1,000 mL (10/23/19 0100)  . pantoprozole (PROTONIX) infusion 8 mg/hr (10/23/19 1000)  . propofol (DIPRIVAN) infusion 15 mcg/kg/min (10/23/19 1000)   sodium chloride, acetaminophen (TYLENOL) oral liquid 160 mg/5 mL, albuterol, docusate, fentaNYL (SUBLIMAZE) injection, heparin, labetalol, midazolam, sodium chloride flush    Exam: Gen intubated, sedated No rash, cyanosis or gangrene Sclera anicteric,throat w/ ETT No jvd or bruits Chest clear bilatto bases RRR no MRG Abd soft ntnd no mass or ascites +bs GU normal male  MS no joint effusions or deformity Ext diffuse 2-3+ nonpitting edema UE > LE R TDC/ RUE AVG B+bruit  Home meds: - amlodipine 10/ carvedilol 25 bid/ hydralazine 100 tid/ minoxidil 2.5 bid  spironolactone 25 qd - pantoprazole 40 - atrovastatin 40/ clopidogrel 75 qd - prn's/ vitamins/ supplements  Dialysis: [facility] MWF 4h 71mn 73.5kg 2.2 bath RU AVG (placed 08/15/19) / R TDCHep none - darbe 200 /wk - hect 3 - venofer 100 x 10 , completed  Assessment/Plan: 1. Acute intracranial bleed- w/ midline shift and IVH.s/pEVD. SP 3% saline, off now. Per primary.   2. ESRD- usual HD MWF. AVG placed 08/15/19, was using at center, using here w/o issue. Consult [facility] for TDC removal later this week. HD today and extra HD tomorrow for vol ^^ 3. Vol excess: will need extra HD this week.  4. Anemia ckd- Hbdown today 6.4> 7.0 > 6.9>>8.8>7.2.s/p 2units pRBC on 11/2. FOBT +. GI has been consultedbut unable to perform EGD (see below).Continuearanesp 200 mcg q Monday.Transfuse prn.  5. HTN - BPat goal , on IV cleviprex prn andthe 4 BP meds per NG 6. H/o CVA - July 2020 7. UGIB- Dark liquid stool w/o significant response to transfusion. EGD in 08/2019 w/duodenitis w/erosions.Unable to perform EGD due to swollen tongue  Rob Hagen Bohorquez 10/23/2019, 11:37 AM  Iron/TIBC/Ferritin/ %Sat    Component Value Date/Time   IRON 18 (L) 08/18/2019 2358   TIBC 239 (L) 08/18/2019 2358   FERRITIN 266 08/18/2019 2358   IRONPCTSAT 8 (L) 08/18/2019 2358  Recent Labs  Lab 10/21/19 0407  10/23/19 0856  NA 154*   < > 157*  K 4.9   < > 6.0*  CL 118*   < > 123*  CO2 21*   < > 18*  GLUCOSE 151*   < > 129*  BUN 83*   < > 137*  CREATININE 7.26*   < > 10.99*  CALCIUM 8.9   < > 9.4  PHOS 5.2*  --   --   ALBUMIN 2.4*  --   --    < > = values in this interval not displayed.   No results for input(s): AST, ALT, ALKPHOS, BILITOT, PROT in the last 168 hours. Recent Labs  Lab 10/23/19 0856  WBC 12.3*  HGB 7.9*  HCT 26.6*  PLT 264

## 2019-10-23 NOTE — Progress Notes (Signed)
Neurosurgery Service Progress Note  Subjective: NAE ON  Objective: Vitals:   10/23/19 0400 10/23/19 0500 10/23/19 0600 10/23/19 0608  BP: 129/60 (!) 114/56 (!) 146/72 126/67  Pulse: 81 78 81   Resp: (!) 23 (!) 21 (!) 22 (!) 22  Temp: 98.9 F (37.2 C)     TempSrc: Axillary     SpO2: 98% 97% 97%   Weight:  88 kg     Temp (24hrs), Avg:99.2 F (37.3 C), Min:98.9 F (37.2 C), Max:99.4 F (37.4 C)  CBC Latest Ref Rng & Units 10/22/2019 10/21/2019 10/21/2019  WBC 4.0 - 10.5 K/uL 12.2(H) - 10.1  Hemoglobin 13.0 - 17.0 g/dL 7.7(L) 7.5(L) 6.5(LL)  Hematocrit 39.0 - 52.0 % 25.8(L) 23.7(L) 21.8(L)  Platelets 150 - 400 K/uL 250 - 247   BMP Latest Ref Rng & Units 10/23/2019 10/22/2019 10/22/2019  Glucose 70 - 99 mg/dL - - 139(H)  BUN 6 - 20 mg/dL - - 121(H)  Creatinine 0.61 - 1.24 mg/dL - - 9.67(H)  BUN/Creat Ratio 9 - 20 - - -  Sodium 135 - 145 mmol/L 159(H) 158(H) 159(H)  Potassium 3.5 - 5.1 mmol/L - - 5.6(H)  Chloride 98 - 111 mmol/L - - 126(H)  CO2 22 - 32 mmol/L - - 19(L)  Calcium 8.9 - 10.3 mg/dL - - 9.2    Intake/Output Summary (Last 24 hours) at 10/23/2019 0720 Last data filed at 10/23/2019 0300 Gross per 24 hour  Intake 1640.92 ml  Output -  Net 1640.92 ml    Current Facility-Administered Medications:  .  0.9 %  sodium chloride infusion, , Intravenous, PRN, Rosalin Hawking, MD, Stopped at 10/16/19 2119 .  acetaminophen (TYLENOL) 160 MG/5ML solution 650 mg, 650 mg, Per Tube, Q4H PRN, Rosalin Hawking, MD, 650 mg at 10/20/19 1957 .  albuterol (PROVENTIL) (2.5 MG/3ML) 0.083% nebulizer solution 2.5 mg, 2.5 mg, Nebulization, Q2H PRN, Sood, Vineet, MD .  amLODipine (NORVASC) tablet 10 mg, 10 mg, Per Tube, QHS, Biby, Sharon L, NP, 10 mg at 10/22/19 2357 .  carvedilol (COREG) tablet 25 mg, 25 mg, Per Tube, BID WC, Aroor, Karena Addison R, MD, 25 mg at 10/22/19 1641 .  chlorhexidine gluconate (MEDLINE KIT) (PERIDEX) 0.12 % solution 15 mL, 15 mL, Mouth Rinse, BID, Aroor, Lanice Schwab, MD, 15 mL at  10/22/19 2048 .  Chlorhexidine Gluconate Cloth 2 % PADS 6 each, 6 each, Topical, Daily, Olalere, Adewale A, MD, 6 each at 10/22/19 1800 .  Chlorhexidine Gluconate Cloth 2 % PADS 6 each, 6 each, Topical, Q0600, Roney Jaffe, MD, 6 each at 10/22/19 1800 .  clevidipine (CLEVIPREX) infusion 0.5 mg/mL, 0-21 mg/hr, Intravenous, Continuous, Rosalin Hawking, MD, Stopped at 10/22/19 2234 .  cloNIDine (CATAPRES) tablet 0.2 mg, 0.2 mg, Per Tube, TID, Rosalin Hawking, MD, 0.2 mg at 10/22/19 2357 .  Darbepoetin Alfa (ARANESP) 200 MCG/0.4ML injection, , , ,  .  Darbepoetin Alfa (ARANESP) injection 200 mcg, 200 mcg, Intravenous, Q Mon-HD, Roney Jaffe, MD .  docusate (COLACE) 50 MG/5ML liquid 100 mg, 100 mg, Per Tube, BID PRN, Merlene Laughter F, NP .  feeding supplement (PRO-STAT SUGAR FREE 64) liquid 30 mL, 30 mL, Per Tube, TID, Mannam, Praveen, MD, 30 mL at 10/22/19 2358 .  feeding supplement (VITAL 1.5 CAL) liquid 1,000 mL, 1,000 mL, Per Tube, Continuous, Rosalin Hawking, MD, Last Rate: 55 mL/hr at 10/23/19 0100, 1,000 mL at 10/23/19 0100 .  fentaNYL (SUBLIMAZE) bolus via infusion 100 mcg, 100 mcg, Intravenous, Once, Olalere, Adewale A, MD .  fentaNYL (  SUBLIMAZE) injection 50-200 mcg, 50-200 mcg, Intravenous, Q30 min PRN, Chesley Mires, MD, 100 mcg at 10/22/19 0935 .  heparin injection 1,000-6,000 Units, 1,000-6,000 Units, Intravenous, PRN, Aroor, Lanice Schwab, MD, 1,000 Units at 10/12/19 2215 .  hydrALAZINE (APRESOLINE) tablet 100 mg, 100 mg, Per Tube, TID WC, Biby, Sharon L, NP, 100 mg at 10/22/19 1641 .  labetalol (NORMODYNE) injection 10-20 mg, 10-20 mg, Intravenous, Q10 min PRN, Rosalin Hawking, MD, 20 mg at 10/20/19 0841 .  MEDLINE mouth rinse, 15 mL, Mouth Rinse, 10 times per day, Aroor, Lanice Schwab, MD, 15 mL at 10/23/19 0557 .  midazolam (VERSED) injection 2 mg, 2 mg, Intravenous, Q2H PRN, Halford Chessman, Vineet, MD .  minoxidil (LONITEN) tablet 10 mg, 10 mg, Per Tube, BID, Rosalin Hawking, MD, 10 mg at 10/23/19 0002 .   pantoprazole (PROTONIX) 80 mg in sodium chloride 0.9 % 250 mL (0.32 mg/mL) infusion, 8 mg/hr, Intravenous, Continuous, Rosalin Hawking, MD, Last Rate: 25 mL/hr at 10/23/19 0300, 8 mg/hr at 10/23/19 0300 .  propofol (DIPRIVAN) 1000 MG/100ML infusion, 0-50 mcg/kg/min, Intravenous, Continuous, Sood, Vineet, MD, Last Rate: 12.93 mL/hr at 10/23/19 0434, 25 mcg/kg/min at 10/23/19 0434 .  senna-docusate (Senokot-S) tablet 1 tablet, 1 tablet, Per Tube, BID, Aroor, Lanice Schwab, MD, 1 tablet at 10/22/19 2357 .  sodium chloride flush (NS) 0.9 % injection 10-40 mL, 10-40 mL, Intracatheter, Q12H, Aroor, Karena Addison R, MD, 10 mL at 10/22/19 2358 .  sodium chloride flush (NS) 0.9 % injection 10-40 mL, 10-40 mL, Intracatheter, PRN, Aroor, Karena Addison R, MD .  spironolactone (ALDACTONE) tablet 25 mg, 25 mg, Per Tube, Daily, Biby, Sharon L, NP, 25 mg at 10/22/19 6073   Physical Exam: Intubated, gaze closer to neutral today, conjugate, PERRL, eyes open spont, w/d on R w/ some w/ posturing on L to pain EVD in place, site c/d/i  Assessment & Plan: 33 y.o. man w/ ESRD on ASA/plavix s/p R ICH/IVH s/p R EVD. Rpt CTH w/ malpositioned, but patent EVD. 11/2 failed clamp trial 2/2 exam, 11/3 rpt CTH stable, ventricles still decompressed, 4th cleared, expected evolution of peri-hematoma-edema, on 3%, 11/5 EVD clamped, 11/6 CTH stable - fourth ventricle slightly increased in size.  -will d/c EVD today - has been clamped for days with stable exam and without increase in ventricular size on CT  Judith Part  10/23/19 7:20 AM

## 2019-10-23 NOTE — Progress Notes (Signed)
STROKE TEAM PROGRESS NOTE   INTERVAL HISTORY Patient`s neurological exam continues to be poor.  Has intermittent posturing of the upper extremities and triple flexor response to sternal rub.  Ventriculostomy remains clamped but has not been removed yet.  No family available at the bedside but wife continues to insist on full support.  Is currently getting hemodialysis.  Blood pressure adequately controlled.  OBJECTIVE Vitals:   10/23/19 1200 10/23/19 1300 10/23/19 1400 10/23/19 1500  BP: (!) 151/76 127/62 (!) 126/59 (!) 149/82  Pulse: 80 79 80 92  Resp: (!) 21 (!) 21 19 (!) 27  Temp:      TempSrc:      SpO2: 100% 100% 100% 97%  Weight:        CBC:  Recent Labs  Lab 10/22/19 1410 10/23/19 0856  WBC 12.2* 12.3*  HGB 7.7* 7.9*  HCT 25.8* 26.6*  MCV 98.5 99.6  PLT 250 [id number]    Basic Metabolic Panel:  Recent Labs  Lab 10/20/19 1346  10/21/19 0407  10/22/19 1410  10/23/19 0856 10/23/19 1245  NA 149*   < > 154*   < > 159*   < > 157* 149*  K 4.2  --  4.9  --  5.6*  --  6.0*  --   CL 110  --  118*  --  126*  --  123*  --   CO2 23  --  21*  --  19*  --  18*  --   GLUCOSE 138*  --  151*  --  139*  --  129*  --   BUN 59*  --  83*  --  121*  --  137*  --   CREATININE 5.46*  --  7.26*  --  9.67*  --  10.99*  --   CALCIUM 8.7*  --  8.9  --  9.2  --  9.4  --   PHOS 4.1  --  5.2*  --   --   --   --   --    < > = values in this interval not displayed.    Lipid Panel:     Component Value Date/Time   CHOL 253 (H) 06/23/2019 0301   TRIG 150 (H) 10/23/2019 0500   HDL 67 06/23/2019 0301   CHOLHDL 3.8 06/23/2019 0301   VLDL 28 06/23/2019 0301   LDLCALC 158 (H) 06/23/2019 0301   HgbA1c:  Lab Results  Component Value Date   HGBA1C 4.9 06/23/2019   Urine Drug Screen:     Component Value Date/Time   LABOPIA NONE DETECTED 09/24/2019 0648   COCAINSCRNUR NONE DETECTED 10/10/2019 0648   LABBENZ NONE DETECTED 10/10/2019 0648   AMPHETMU NONE DETECTED 09/17/2019 0648   THCU NONE  DETECTED 10/05/2019 0648   LABBARB NONE DETECTED 10/09/2019 0648    Alcohol Level No results found for: ETH  IMAGING  Ct Head Wo Contrast 10/20/2019 1. Unchanged large right basal ganglia hemorrhage with lateral and third ventricular extension. Stable swelling and 7 mm of midline shift. Stable ventricular volume. 2. Background chronic small vessel ischemia.   CT Head WO Contrast 10/21/2019 IMPRESSION: 1. Stable large right basal ganglia hemorrhage with intraventricular extension since 10/20/2019. Stable regional edema and intracranial mass effect with leftward midline shift of 8-9 mm. 2. Stable right EVD which appears to communicate with the right lateral ventricle. Stable ventricle size and configuration. 3. No new intracranial abnormality.  Ct Head Wo Contrast 10/23/2019 2653 1. No change in size or  appearance of a large intraparenchymal hemorrhage centered in the right basal ganglia with extension into the lateral ventricles. 2. Unchanged edema and mass effect with approximately 7 mm right-to-left midline shift. 3. Unchanged caliber and configuration of the lateral ventricles with layering internal blood product and a right frontal approach shunt catheter.   CT Head Wo Contrast 10/16/2019 1. No significant interval change in size and appearance of right basal ganglia hemorrhage with intraventricular extension. Stable regional mass effect with up to 7 mm of localized right-to-left midline shift. 2. Stable right frontal approach ventriculostomy with tip terminating near the right thalamus. Stable ventricular size and morphology. 3. Slightly decreased cortical sulcation throughout the brain as compared to previous, suggesting worsened cerebral edema. 4. No other new acute intracranial abnormality.  CT Head Wo Contrast 10/15/2019 1. Unchanged size of large intraparenchymal hematoma centered in the right basal ganglia with intraventricular extension. 2. Decreased size of the lateral  ventricles with unchanged 6 mm of leftward midline shift. 3. No new site of hemorrhage.  Ct Head Wo Contrast 10/13/2019 1. Unchanged right basal ganglia and intraventricular clot.  2. Improved ventriculomegaly.  3. Stable 1 cm leftward midline shift.   Dg Chest Port 1 View 10/20/2019 1. Mild diffuse bilateral airspace opacification may be due to edema or pneumonia. 2. Improving left lower lobe consolidation.   Dg Hand 2 View Right 10/25/2019 Soft tissue enlargement of the proximal right second digit without adjacent osseous or joint abnormality. This was present but smaller on the 2016 study and is likely benign with a nonaggressive appearance.     Dg Chest Port 1 View 10/13/2019 1. Unremarkable hardware positioning.  2. Improved aeration but persistent dense opacity behind the heart.   Transthoracic Echocardiogram  1. The left ventricle has hyperdynamic systolic function, with an ejection fraction of >65%. The cavity size was decreased. There is severe concentric left ventricular hypertrophy. Left ventricular diastolic parameters were normal. No evidence of left  ventricular regional wall motion abnormalities.  2. The right ventricle has normal systolic function. The cavity was normal. There is no increase in right ventricular wall thickness. Right ventricular systolic pressure could not be assessed.  3. Small pericardial effusion.  4. No evidence of mitral valve stenosis.  5. The aortic valve is tricuspid. No stenosis of the aortic valve.  EEG 10/15/2019 This is an abnormal electroencephalogram secondary to focal right hemispheric slowing superimposed on a diffusely slow background.  Occasional right frontal sharp waves are noted as well.   These findings are consistent with the patient's history of right ICH with right EVD placement.  No subclinical seizure activity is noted.     PHYSICAL EXAM  Temp:  [98.8 F (37.1 C)-99.4 F (37.4 C)] 98.9 F (37.2 C) (11/09 1130) Pulse  Rate:  [77-97] 92 (11/09 1500) Resp:  [19-33] 27 (11/09 1500) BP: (104-177)/(49-94) 149/82 (11/09 1500) SpO2:  [95 %-100 %] 97 % (11/09 1500) FiO2 (%):  [35 %-40 %] 35 % (11/09 1155) Weight:  [88 kg-94 kg] 91.1 kg (11/09 1130)  General - obese middle aged african [place] male, intubated on sedation, mild respiratory distress. Has dialysis catheter  Ophthalmologic - fundi not visualized due to noncooperation.  Cardiovascular - Regular rhythm and tachycardia.  Neuro - intubated on sedation, intermittently open eyes on voice but not tracking, not following commands, eyes in upper gaze position. Not blinking to visual threat bilaterally, PERRL. Corneal reflex weakly present, gag and cough present. Breathing over the vent.  Facial symmetry not able to  test due to ET tube.  Tongue protrusion not cooperative.  Extensor posturing in the uppers. triple flex in the lowers possible some myoclonus on pain stim lowers.  DTR diminished, toes mute, clonus bilateral AJs. Sensation, coordination and gait not tested.   ASSESSMENT/PLAN Mr. Aleksi Lavigna is a 33 y.o. male with history of hypertension, end-stage renal disease on ASA+Plavix for recent stroke presents to the emergency department obtunded with left-sided plegia and right gaze deviation. CT head showed 60 cc hematoma in the right basal ganglia with intraventricular extension, hydrocephalus, and 1 cm of midline shift. Patient was emergency intubated mainly for airway protection. EVD was emergently placed at bedside by Enders for hydrocephalus. Patient also was given desmopressin given the fact that he is using Plavix with small studies supporting its use in the setting of antiplatelets diminishing incidence of hemorrhage expansion.   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 Mass effect. 50mm L midline shift. IVH increased in R lateral ventricle atrium. R EVD  Repeat CT head - 10/13/19 - unchanged R basal ganglia ICH and IVH. Improved ventriculomegaly. Stable 1cm midline shift.  [facility] 10/15/19 - Unchanged size of large intraparenchymal hematoma centered in the right basal ganglia with intraventricular extension.Decreased size of the lateral ventricles with unchanged 6 mm of leftward midline shift.  Repeat CT head 11/2 no significant change in hemorrhage, 75mm midline shift. EVD w/ stable ventricle size. Slight decreased cortical sulcation suggesting worsening edema.  Repeat CT 11/3 no change in hemorrhage or mass effect, 57mm midline shift.EVD. no change in ventricles  Repeat CT head 11/6 - unchanged basal ganglia hemorrhage 67mm midline shift.   CT Head WO Contrast - 10/21/2019 - stable (see above) leftward midline shift of 8-9 mm.  2D Echo - EF >65%. No source of embolus   EEG no sz.  Repeat EEG no sz, diffused encephalopathy  [employer] Virus 2  neg  LDL - 158 in July  HgbA1c 4.9 in July    UDS neg  VTE prophylaxis - SCDs only d/t anemia needing transfusion  ASA + Plavix prior to admission, now not indicated  Therapy recommendations:  Pending (need to reorder when ready)  Disposition:  Pending  Discussed with wife today over the phone, wife wants every means of aggressive care (including trach and PEG) and full code.   Cerebral Edema Obstructive hydrocephalus  EVD placed in ED (Ostergard)  Na 145->148->153->155->156->154->157  Goal Na 150-155  On 3%, (50 cc / hr) will start to taper off after EVD removal   Neurosurgery on board  Ventricles remain open. NSG does not feel neuro worsening r/t hydrocephalus  EVD clamp again 11/5 - opened   Repeat CT 11/6 unchanged basal ganglia hemorrhage 54mm midline shift. Stable ventricular volume  EVD clamped -> opened 11/7 per Dr Christella Noa - worsening neuro exam  CT Head WO Contrast -  10/21/2019 - stable (see above) leftward midline shift of 8-9 mm.  Acute Hypoxemic Respiratory Failure  Intubated  On low dose sedation -> respiratory distress -> increased sedation  Unable to extubate d/t mental status  CCM on board  Likely need trach and PEG  Fever   Temp 100.7->100->afebrile->100.4->101.1->afebrile->101.3->101.5->100.6->100.4->99->99.8  CXR - B airspace dz  WBC 10.3->12.9->12.2->15.4->10.1  UA showed WBC > 50  Urine Cx - difficult collection  for pt ESRD   On rocephine 10/31>>11/4 - now off abxs  Tylenol PRN  Blood culture pending (no growth 2 days) - pending  CCM on board  Urine culture - multiple species  Hypertensive Emergency  BP as high as 194/105 on arrival  Home BP meds: norvasc 10, coreg 25, hydralazine 100 tid, sponolactone 25, minoxidil 5 bid  Now on norvasc 10, coreg 25, hydralazine 100 tid, sponolactone 25, minoxidil increased to 10mg  bid and clonidine  Increased to 0.2 tid (10/20/19) . Goal SBP <140  . Off cleviprex now . Nephrology on board . Long-term BP goal normotensive . SBP - 120s - 170s today - monitor closely - may need to increase clonidine  Hx stroke/TIA  06/2019 admitted for confusion and hypertensive encephalopathy.  MRI showed numerous bilateral anterior and posterior circulation infarcts throughout.  MRA head and neck unremarkable.  EF > 65%.  LDL 158, A1c 4.9.  UDS positive for cocaine and THC.   Discharged on DAPT  ESRD on HD   MWF schedule  Renal following  Had HD 10/20/19  Anemia d/t CKD, hx duodenitis  Hb 7.1->8.0->7.7->7.4->6.4->6.9->8.3->8.8->7.2->9.2->8.0->8.0->6.2->6.9->transfused->6.5->transfused->7.5  Received PRBC transfusion x 2 on 11/2, x 1 on 11/6 and 11/7  EGD in Sept w/ reactive gastropathy and peptic duodenitis  Stool occult blood +  On PPI gtt  GI consult recommended maximizing medical therapy, unable to see much with neonatal scope and not able to perform any therapies via neonatal  scope. Recommend transfuse as indicated - they have signed off and call back after extubation  CBC monitoring  Hyperlipidemia  Home Lipid lowering medication: none   LDL 158 in July  Hold statin given hemorrhage   Consider statin on discharge  Dysphagia Malnutrition, moderate   . Secondary to stroke . NPO . On TF @55  via OG  Tobacco abuse  Current smoker  Smoking cessation counseling will be provided   Other Stroke Risk Factors  ETOH abuse; wife reports he quit in July  Hx polysubstance abuse, hx cocaine abuse. UDS neg this admission  Other Active Problems  R index finger edema - no fx, chronic worsening soft tissue swelling  Hyperkalemia 5.6 -> 4.2->4.9    PLAN Patient's neurological exam and overall general medical condition remains quite poor with chances of making any meaningful improvement in enjoyment quality of life being negligible but patient's wife continues to insist on full medical support.  Appreciate help from nephrology, critical care medicine and neurosurgical teams.  Plan to discuss with wife tracheostomy, PEG tube to be done in the next few days.  Discussed with Dr. Ernest Mallick critical care medicine and Dr. Joelyn Oms nephrologist This patient is critically ill and at significant risk of neurological worsening, death and care requires constant monitoring of vital signs, hemodynamics,respiratory and cardiac monitoring, extensive review of multiple databases, frequent neurological assessment, discussion with family, other specialists and medical decision making of high complexity.I have made any additions or clarifications directly to the above note.This critical care time does not reflect procedure time, or teaching time or supervisory time of PA/NP/Med Resident etc but could involve care discussion time.  I spent 30 minutes of neurocritical care time  in the care of  this patient.       Hospital day # 12  10/23/2019          To contact Stroke  Continuity provider, please refer to [email or web address] After hours, contact General Neurology

## 2019-10-23 NOTE — Progress Notes (Signed)
NAME:  William [name], MRN:  [id number], DOB:  30-Jun-1986, LOS: 12 ADMISSION DATE:  10/12/2019, CONSULTATION DATE:  10/02/2019 REFERRING MD:  Ward  CHIEF COMPLAINT:  AMS.   Brief History   33 yo male smoker presented with dyspnea, slurred speech, facial droop, AMS, Lt side weakness.  Found to have very large Rt BG ICH with IVH, cerebral edema and hydrocephalus.  Required intubation for airway.  Had EVD placed.  Past Medical History  CVA, HTN, ESRD, Cocaine abuse, Anemia  Significant Hospital Events   10/28 admit, EVD placed 11/01 transfuse PRBC 11/02 GI consult, start protonix gtt 11/03 attempted EGD >> procedure aborted due to unusual upper airway anatomy 11/05 Tm 101.5, blood cultures sent 11/06 transfuse PRBC  Consults:  Neurosurgery Neprhology  GI >> s/o 11/05  Procedures:  ETT 10/28 >> Left IJ CVL 10/28 >>  Significant Diagnostic Tests:  CT head10/28 >> 6.8 x 3.6 x 4.9 cm Rt BG ICH with IV extension EEG 11/05 >> generalized slowing  Micro Data:  SARS CoV2 10/28 >> negative Blood 11/05 >>  Penis culture 11/8>> mod GPC in pairs, abundant WBC, mostly PMN>>  Antimicrobials:  Ceftriaxone 10/31 >> 11/04  Interim history/subjective:  Tolerating HD Not responding to commands  Objective:  Blood pressure (!) 150/78, pulse 78, temperature 99.2 F (37.3 C), temperature source Axillary, resp. rate 19, weight 90 kg, SpO2 100 %.    Vent Mode: PSV;CPAP FiO2 (%):  [35 %-40 %] 35 % Set Rate:  [18 bmp] 18 bmp Vt Set:  [560 mL] 560 mL PEEP:  [5 cmH20] 5 cmH20 Pressure Support:  [10 cmH20] 10 cmH20 Plateau Pressure:  [15 cmH20-21 cmH20] 16 cmH20   Intake/Output Summary (Last 24 hours) at 10/23/2019 1110 Last data filed at 10/23/2019 0700 Gross per 24 hour  Intake 1624.54 ml  Output -  Net 1624.54 ml   Filed Weights   10/22/19 0500 10/23/19 0500 10/23/19 0745  Weight: 89.6 kg 88 kg 90 kg    Examination:  General -chronically ill-appearing young man lying in bed,  intubated, not sedated Eyes -anicteric, reactive pupils ENT -ET tube and OG tube in place Cardiac -S1-S2, regular rate and rhythm, no murmurs Chest -bilateral rhonchi cleared with suctioning.  Breathing above the vent Abdomen -soft, nontender, nondistended Extremities -edema in all extremities, fistula in right upper extremity skin -no rashes, no erythema or drainage around right subclavian tunneled catheter Neuro -periodic blinking with minimal eye opening, not opening eyes to verbal stimulation, not tracking.  Left upper extremity flexion posturing with stimulation, but not withdrawing.   Resolved problems:  UTI  Assessment & Plan:   Acute hypoxemic respiratory failure requiring mechanical ventilation -Continue LTVV, 8 cc/kg ideal body weight.  Goal plateau less than 30 and driving pressure less than 15. -Daily SAT and SBT as appropriate.  Mental status currently precludes extubation -Agree that he will likely require tracheostomy if the family wishes to continue to pursue aggressive care.  Overall I think his prognosis is poor.  Fever 11/4, 100.4 , currently afebrile -Continue to monitor -Evaluate for infection if fevers recur, but these are potentially centrally mediated fevers  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, edema and hydrocephalus s/p EVD History of CVA -EVD per neurosurgery; appreciate their recommendations -Appreciate neurology's recommendations -Continue to monitor sodium, will allow this to drift down slowly  Hypertensive emergency Hyperlipidemia -Goal SBP less than 160 -Continue current antihypertensive regimen  End-stage renal disease -Dialysis today per nephrology -Renally dose medications  Acute  upper GI bleed Anemia of critical illness Anemia of chronic disease -Continue PPI -Continue to monitor hemoglobin; will plan to transfuse for H&H less than 7 or hemodynamic instability associated with active bleeding   -This remains stable may need to restart subcu heparin cautiously in the coming days  Metabolic encephalopathy History of cocaine abuse>> UDS negative this admission -Hold sedation as much as possible to allow for neuro prognostication -Renally dose medications   Best Practice:  Diet: Continue tube feeds DVT prophylaxis: SCDs GI prophylaxis: Protonix Mobility: Bedrest Code Status: Full code Disposition: ICU  Labs:   CMP Latest Ref Rng & Units 10/23/2019 10/23/2019 10/22/2019  Glucose 70 - 99 mg/dL 129(H) - -  BUN 6 - 20 mg/dL 137(H) - -  Creatinine 0.61 - 1.24 mg/dL 10.99(H) - -  Sodium 135 - 145 mmol/L 157(H) 159(H) 158(H)  Potassium 3.5 - 5.1 mmol/L 6.0(H) - -  Chloride 98 - 111 mmol/L 123(H) - -  CO2 22 - 32 mmol/L 18(L) - -  Calcium 8.9 - 10.3 mg/dL 9.4 - -  Total Protein 6.5 - 8.1 g/dL - - -  Total Bilirubin 0.3 - 1.2 mg/dL - - -  Alkaline Phos 38 - 126 U/L - - -  AST 15 - 41 U/L - - -  ALT 0 - 44 U/L - - -   CBC Latest Ref Rng & Units 10/23/2019 10/22/2019 10/21/2019  WBC 4.0 - 10.5 K/uL 12.3(H) 12.2(H) -  Hemoglobin 13.0 - 17.0 g/dL 7.9(L) 7.7(L) 7.5(L)  Hematocrit 39.0 - 52.0 % 26.6(L) 25.8(L) 23.7(L)  Platelets 150 - 400 K/uL 264 250 -   ABG    Component Value Date/Time   PHART 7.408 11/01/2019 1743   PCO2ART 37.3 11/05/2019 1743   PO2ART 102.0 10/19/2019 1743   HCO3 23.2 10/16/2019 1743   TCO2 24 11/11/2019 1743   ACIDBASEDEF 1.0 10/16/2019 1743   O2SAT 98.0 11/02/2019 1743   CBG (last 3)  Recent Labs    10/22/19 2326 10/23/19 0316 10/23/19 0742  GLUCAP 117* 116* 126*   This patient is critically ill with multiple organ system failure which requires frequent high complexity decision making, assessment, support, evaluation, and titration of therapies. This was completed through the application of advanced monitoring technologies and extensive interpretation of multiple databases. During this encounter critical care time was devoted to patient care  services described in this note for 35 minutes.  Julian Hy, DO 10/23/19 11:23 AM [facility]

## 2019-10-24 DIAGNOSIS — I10 Essential (primary) hypertension: Secondary | ICD-10-CM

## 2019-10-24 LAB — RENAL FUNCTION PANEL
Albumin: 2.5 g/dL — ABNORMAL LOW (ref 3.5–5.0)
Anion gap: 16 — ABNORMAL HIGH (ref 5–15)
BUN: 99 mg/dL — ABNORMAL HIGH (ref 6–20)
CO2: 21 mmol/L — ABNORMAL LOW (ref 22–32)
Calcium: 9.2 mg/dL (ref 8.9–10.3)
Chloride: 110 mmol/L (ref 98–111)
Creatinine, Ser: 8.85 mg/dL — ABNORMAL HIGH (ref 0.61–1.24)
GFR calc Af Amer: 8 mL/min — ABNORMAL LOW (ref >60)
GFR calc non Af Amer: 7 mL/min — ABNORMAL LOW (ref >60)
Glucose, Bld: 134 mg/dL — ABNORMAL HIGH (ref 70–99)
Phosphorus: 6.6 mg/dL — ABNORMAL HIGH (ref 2.5–4.6)
Potassium: 5.4 mmol/L — ABNORMAL HIGH (ref 3.5–5.1)
Sodium: 147 mmol/L — ABNORMAL HIGH (ref 135–145)

## 2019-10-24 LAB — CBC
HCT: 24.3 % — ABNORMAL LOW (ref 39.0–52.0)
Hemoglobin: 7.4 g/dL — ABNORMAL LOW (ref 13.0–17.0)
MCH: 29.6 pg (ref 26.0–34.0)
MCHC: 30.5 g/dL (ref 30.0–36.0)
MCV: 97.2 fL (ref 80.0–100.0)
Platelets: 220 10*3/uL (ref 150–400)
RBC: 2.5 MIL/uL — ABNORMAL LOW (ref 4.22–5.81)
RDW: 17.3 % — ABNORMAL HIGH (ref 11.5–15.5)
WBC: 13 10*3/uL — ABNORMAL HIGH (ref 4.0–10.5)
nRBC: 0.4 % — ABNORMAL HIGH (ref 0.0–0.2)

## 2019-10-24 LAB — CULTURE, BLOOD (ROUTINE X 2)
Culture: NO GROWTH
Culture: NO GROWTH
Special Requests: ADEQUATE
Special Requests: ADEQUATE

## 2019-10-24 LAB — GLUCOSE, CAPILLARY
Glucose-Capillary: 125 mg/dL — ABNORMAL HIGH (ref 70–99)
Glucose-Capillary: 126 mg/dL — ABNORMAL HIGH (ref 70–99)

## 2019-10-24 LAB — TRIGLYCERIDES: Triglycerides: 273 mg/dL — ABNORMAL HIGH (ref <150)

## 2019-10-24 LAB — SODIUM
Sodium: 148 mmol/L — ABNORMAL HIGH (ref 135–145)
Sodium: 146 mmol/L — ABNORMAL HIGH (ref 135–145)

## 2019-10-24 LAB — TYPE AND SCREEN
ABO/RH(D): O POS
Antibody Screen: NEGATIVE

## 2019-10-24 MED ORDER — ONDANSETRON HCL 4 MG/2ML IJ SOLN: 4.0000 mg | Freq: Four times a day (QID) | INTRAMUSCULAR | Status: DC | PRN

## 2019-10-24 MED ORDER — DIPHENHYDRAMINE HCL 50 MG/ML IJ SOLN: 25.0000 mg | INTRAMUSCULAR | Status: DC | PRN

## 2019-10-24 MED ORDER — CHLORHEXIDINE GLUCONATE CLOTH 2 % EX PADS
6.0000 | MEDICATED_PAD | Freq: Every day | CUTANEOUS | Status: DC
  Administered 2019-10-24: 6 via TOPICAL

## 2019-10-24 MED ORDER — MINOXIDIL 10 MG PO TABS
10.0000 mg | ORAL_TABLET | Freq: Two times a day (BID) | ORAL | Status: DC
  Administered 2019-10-26: 10 mg
  Filled 2019-10-24 (×4): qty 1

## 2019-10-24 MED ORDER — MORPHINE SULFATE (PF) 2 MG/ML IV SOLN
2.0000 mg | INTRAVENOUS | Status: AC | PRN
  Administered 2019-10-26 (×3): 2 mg via INTRAVENOUS
  Filled 2019-10-24 (×3): qty 1

## 2019-10-24 MED ORDER — POLYVINYL ALCOHOL 1.4 % OP SOLN
1.0000 [drp] | Freq: Four times a day (QID) | OPHTHALMIC | Status: DC | PRN
  Filled 2019-10-24: qty 15

## 2019-10-24 MED ORDER — MORPHINE 100MG IN NS 100ML (1MG/ML) PREMIX INFUSION
0.0000 mg/h | INTRAVENOUS | Status: DC
  Administered 2019-10-24: 16:00:00 5 mg/h via INTRAVENOUS
  Administered 2019-10-25: 10 mg/h via INTRAVENOUS
  Filled 2019-10-24 (×2): qty 100

## 2019-10-24 MED ORDER — ONDANSETRON 4 MG PO TBDP: 4.0000 mg | ORAL_TABLET | Freq: Four times a day (QID) | ORAL | Status: DC | PRN

## 2019-10-24 MED ORDER — HEPARIN SODIUM (PORCINE) 1000 UNIT/ML IJ SOLN
INTRAMUSCULAR | Status: AC
  Administered 2019-10-24: 09:00:00 1000 [IU] via INTRAVENOUS
  Filled 2019-10-24: qty 4

## 2019-10-24 MED ORDER — GLYCOPYRROLATE 0.2 MG/ML IJ SOLN: 0.2000 mg | INTRAMUSCULAR | Status: DC | PRN

## 2019-10-24 MED ORDER — GLYCOPYRROLATE 1 MG PO TABS: 1.0000 mg | ORAL_TABLET | ORAL | Status: DC | PRN

## 2019-10-24 MED ORDER — MORPHINE BOLUS VIA INFUSION
5.0000 mg | INTRAVENOUS | Status: DC | PRN
  Filled 2019-10-24: qty 5

## 2019-10-24 MED ORDER — GLYCOPYRROLATE 0.2 MG/ML IJ SOLN
0.2000 mg | INTRAMUSCULAR | Status: DC | PRN
  Administered 2019-10-24 – 2019-10-26 (×4): 0.2 mg via INTRAVENOUS
  Filled 2019-10-24 (×4): qty 1

## 2019-10-24 MED ORDER — ALBUTEROL SULFATE (2.5 MG/3ML) 0.083% IN NEBU: 2.5000 mg | INHALATION_SOLUTION | RESPIRATORY_TRACT | Status: DC | PRN

## 2019-10-24 MED ORDER — LORAZEPAM 2 MG/ML IJ SOLN
2.0000 mg | INTRAMUSCULAR | Status: DC | PRN
  Administered 2019-10-26: 4 mg via INTRAVENOUS
  Administered 2019-10-26: 2 mg via INTRAVENOUS
  Filled 2019-10-24: qty 1
  Filled 2019-10-24: qty 2

## 2019-10-24 NOTE — Procedures (Signed)
   I was present at this dialysis session, have reviewed the session itself and made  appropriate changes Kelly Splinter MD [place] pager [phone]   10/24/2019, 10:38 AM

## 2019-10-24 NOTE — Progress Notes (Signed)
[facility] Progress Note  Subjective: on HD  Vitals:   10/24/19 0930 10/24/19 0945 10/24/19 1000 10/24/19 1015  BP: (!) 172/90 (!) 178/97 (!) 178/91 (!) 177/90  Pulse: 80 79 79 81  Resp: (!) 29 (!) 29 (!) 28 (!) 29  Temp:      TempSrc:      SpO2: 100% 100% 100% 100%  Weight:        Inpatient medications: . amLODipine  10 mg Per Tube QHS  . carvedilol  25 mg Per Tube BID WC  . chlorhexidine gluconate (MEDLINE KIT)  15 mL Mouth Rinse BID  . Chlorhexidine Gluconate Cloth  6 each Topical Daily  . Chlorhexidine Gluconate Cloth  6 each Topical Q0600  . Chlorhexidine Gluconate Cloth  6 each Topical Q0600  . cloNIDine  0.2 mg Per Tube TID  . darbepoetin (ARANESP) injection - DIALYSIS  200 mcg Intravenous Q Mon-HD  . feeding supplement (PRO-STAT SUGAR FREE 64)  30 mL Per Tube TID  . fentaNYL  100 mcg Intravenous Once  . hydrALAZINE  100 mg Per Tube TID WC  . mouth rinse  15 mL Mouth Rinse 10 times per day  . minoxidil  10 mg Per Tube BID  . senna-docusate  1 tablet Per Tube BID  . sodium chloride flush  10-40 mL Intracatheter Q12H  . spironolactone  25 mg Per Tube Daily   . sodium chloride Stopped (10/16/19 2119)  . clevidipine Stopped (10/22/19 2234)  . feeding supplement (VITAL 1.5 CAL) 55 mL/hr at 10/24/19 0000  . pantoprozole (PROTONIX) infusion 8 mg/hr (10/24/19 0600)  . propofol (DIPRIVAN) infusion 20 mcg/kg/min (10/24/19 0809)   sodium chloride, acetaminophen (TYLENOL) oral liquid 160 mg/5 mL, albuterol, docusate, fentaNYL (SUBLIMAZE) injection, heparin, labetalol, midazolam, sodium chloride flush    Exam: Gen intubated, sedated No rash, cyanosis or gangrene Sclera anicteric,throat w/ ETT No jvd or bruits Chest clear bilatto bases RRR no MRG Abd soft ntnd no mass or ascites +bs GU normal male  MS no joint effusions or deformity Ext diffuse 2-3+ nonpitting edema UE > LE R TDC/ RUE AVG B+bruit  Home meds: - amlodipine 10/ carvedilol 25 bid/  hydralazine 100 tid/ minoxidil 2.5 bid spironolactone 25 qd - pantoprazole 40 - atrovastatin 40/ clopidogrel 75 qd - prn's/ vitamins/ supplements  Dialysis: [facility] MWF 4h 21mn 73.5kg 2.2 bath RU AVG (placed 08/15/19) / R TDCHep none - darbe 200 /wk - hect 3 - venofer 100 x 10 , completed  Assessment/Plan: 1. Acute intracranial bleed- w/ midline shift and IVH.s/pEVD. SP 3% saline. Per primary.   2. ESRD- usual HD MWF. AVG placed 08/15/19, was using at center, using here w/o issue. Consult [facility] for TDC removal later this week. Daily HD until vol overload resolved.  3. Vol excess: markedly vol overloaded, up 21kg yest, is up 15kg pre HD this am.  4. Anemia ckd- Hbdown today 6.4> 7.0 > 6.9>>8.8>7.2.s/p 2units pRBC on 11/2. FOBT +. GI has been consultedbut unable to perform EGD (see below).Continuearanesp 200 mcg q Monday.Transfuse prn.  5. HTN - BPat goal , on IV cleviprex prn and 4 BP meds per NG 6. H/o CVA - July 2020 7. UGIB- Dark liquid stool w/o significant response to transfusion. EGD in 08/2019 w/duodenitis w/erosions.Unable to perform EGD due to swollen tongue  Rob Kahla Risdon 10/24/2019, 10:28 AM  Iron/TIBC/Ferritin/ %Sat    Component Value Date/Time   IRON 18 (L) 08/18/2019 2358   TIBC 239 (L) 08/18/2019 2358   FERRITIN  266 08/18/2019 2358   IRONPCTSAT 8 (L) 08/18/2019 2358   Recent Labs  Lab 10/24/19 0814  NA 147*  K 5.4*  CL 110  CO2 21*  GLUCOSE 134*  BUN 99*  CREATININE 8.85*  CALCIUM 9.2  PHOS 6.6*  ALBUMIN 2.5*   No results for input(s): AST, ALT, ALKPHOS, BILITOT, PROT in the last 168 hours. Recent Labs  Lab 10/24/19 0813  WBC 13.0*  HGB 7.4*  HCT 24.3*  PLT 220

## 2019-10-24 NOTE — Progress Notes (Signed)
Discussed scheduled BP medications with MD and dialysis nurse at bedside. Orders to hold BP medications until dialysis is finished.

## 2019-10-24 NOTE — Progress Notes (Addendum)
STROKE TEAM PROGRESS NOTE   INTERVAL HISTORY His family is not at the bedside.  EVD drain removed yesterday by NSGY. He is receiving extra session of HD 2/2 volume overload.  He has his eyes open but not responsive.  He does not follow commands, still posturing in upper extremities intermittently.  Neurological exam still poor and not much improvement.  Prior discussions with family wants aggressive treatment including trach and PEG.  Ventriculostomy was discontinued yesterday by neurosurgery without any significant change in his neurological exam. I met with the patient's wife at the bedside who has now accepted the reality that patient is unlikely to survive and have meaningful quality of life and agrees to DNR and will likely make him comfort care in the next few days after family member serves have said goodbye Vitals:   10/24/19 1000 10/24/19 1015 10/24/19 1030 10/24/19 1121  BP: (!) 178/91 (!) 177/90 (!) 157/74 (!) 156/73  Pulse: 79 81 74 76  Resp: (!) 28 (!) 29 (!) 25 (!) 23  Temp:   97.6 F (36.4 C)   TempSrc:   Axillary   SpO2: 100% 100% 100% 100%  Weight:   85.1 kg     CBC:  Recent Labs  Lab 10/23/19 0856 10/24/19 0813  WBC 12.3* 13.0*  HGB 7.9* 7.4*  HCT 26.6* 24.3*  MCV 99.6 97.2  PLT 264 053    Basic Metabolic Panel:  Recent Labs  Lab 10/21/19 0407  10/23/19 0856  10/24/19 0515 10/24/19 0814  NA 154*   < > 157*   < > 146* 147*  K 4.9   < > 6.0*  --   --  5.4*  CL 118*   < > 123*  --   --  110  CO2 21*   < > 18*  --   --  21*  GLUCOSE 151*   < > 129*  --   --  134*  BUN 83*   < > 137*  --   --  99*  CREATININE 7.26*   < > 10.99*  --   --  8.85*  CALCIUM 8.9   < > 9.4  --   --  9.2  PHOS 5.2*  --   --   --   --  6.6*   < > = values in this interval not displayed.   Lipid Panel:     Component Value Date/Time   CHOL 253 (H) 06/23/2019 0301   TRIG 273 (H) 10/24/2019 0515   HDL 67 06/23/2019 0301   CHOLHDL 3.8 06/23/2019 0301   VLDL 28 06/23/2019 0301   LDLCALC 158 (H) 06/23/2019 0301   HgbA1c:  Lab Results  Component Value Date   HGBA1C 4.9 06/23/2019   Urine Drug Screen:     Component Value Date/Time   LABOPIA NONE DETECTED 09/29/2019 0648   COCAINSCRNUR NONE DETECTED 09/16/2019 0648   LABBENZ NONE DETECTED 10/10/2019 0648   AMPHETMU NONE DETECTED 10/14/2019 0648   THCU NONE DETECTED 09/25/2019 0648   LABBARB NONE DETECTED 09/16/2019 0648    Alcohol Level No results found for: ETH  IMAGING Ct Head Wo Contrast 10/20/2019 1. Unchanged large right basal ganglia hemorrhage with lateral and third ventricular extension. Stable swelling and 7 mm of midline shift. Stable ventricular volume. 2. Background chronic small vessel ischemia.   CT Head WO Contrast 10/21/2019 IMPRESSION: 1. Stable large right basal ganglia hemorrhage with intraventricular extension since 10/20/2019. Stable regional edema and intracranial mass effect with leftward midline  shift of 8-9 mm. 2. Stable right EVD which appears to communicate with the right lateral ventricle. Stable ventricle size and configuration. 3. No new intracranial abnormality.  Ct Head Wo Contrast 11/05/2019 2653 1. No change in size or appearance of a large intraparenchymal hemorrhage centered in the right basal ganglia with extension into the lateral ventricles. 2. Unchanged edema and mass effect with approximately 7 mm right-to-left midline shift. 3. Unchanged caliber and configuration of the lateral ventricles with layering internal blood product and a right frontal approach shunt catheter.   CT Head Wo Contrast 10/16/2019 1. No significant interval change in size and appearance of right basal ganglia hemorrhage with intraventricular extension. Stable regional mass effect with up to 7 mm of localized right-to-left midline shift. 2. Stable right frontal approach ventriculostomy with tip terminating near the right thalamus. Stable ventricular size and morphology. 3. Slightly decreased  cortical sulcation throughout the brain as compared to previous, suggesting worsened cerebral edema. 4. No other new acute intracranial abnormality.  CT Head Wo Contrast 10/15/2019 1. Unchanged size of large intraparenchymal hematoma centered in the right basal ganglia with intraventricular extension. 2. Decreased size of the lateral ventricles with unchanged 6 mm of leftward midline shift. 3. No new site of hemorrhage.  Ct Head Wo Contrast 10/13/2019 1. Unchanged right basal ganglia and intraventricular clot.  2. Improved ventriculomegaly.  3. Stable 1 cm leftward midline shift.   Dg Chest Port 1 View 10/20/2019 1. Mild diffuse bilateral airspace opacification may be due to edema or pneumonia. 2. Improving left lower lobe consolidation.   Dg Hand 2 View Right 10/19/2019 Soft tissue enlargement of the proximal right second digit without adjacent osseous or joint abnormality. This was present but smaller on the 2016 study and is likely benign with a nonaggressive appearance.     Dg Chest Port 1 View 10/13/2019 1. Unremarkable hardware positioning.  2. Improved aeration but persistent dense opacity behind the heart.   Transthoracic Echocardiogram  1. The left ventricle has hyperdynamic systolic function, with an ejection fraction of >65%. The cavity size was decreased. There is severe concentric left ventricular hypertrophy. Left ventricular diastolic parameters were normal. No evidence of left  ventricular regional wall motion abnormalities.  2. The right ventricle has normal systolic function. The cavity was normal. There is no increase in right ventricular wall thickness. Right ventricular systolic pressure could not be assessed.  3. Small pericardial effusion.  4. No evidence of mitral valve stenosis.  5. The aortic valve is tricuspid. No stenosis of the aortic valve.  EEG 10/15/2019 This is an abnormal electroencephalogram secondary to focal right hemispheric slowing  superimposed on a diffusely slow background. Occasional right frontal sharp waves are noted as well. These findings are consistent with the patient's history of right ICH with right EVD placement. No subclinical seizure activity is noted.   PHYSICAL EXAM General - obese middle aged african [place] male, intubated on sedation, mild respiratory distress. Has dialysis catheter  Vitals:   10/24/19 1200 10/24/19 1300  BP: 135/64 (!) 150/74  Pulse: 74 84  Resp: (!) 22 (!) 28  Temp:    SpO2: 100% 92%    Ophthalmologic - fundi not visualized due to noncooperation.  Cardiovascular - Regular rhythm and tachycardia.  Neuro - intubated on sedation,  Eyes are open but not tracking, not following commands, eyes in upper gaze position. Not blinking to visual threat bilaterally, PERRL. Corneal reflex weakly present, gag and cough present. Breathing over the vent.  Facial symmetry  not able to test due to ET tube.  Tongue protrusion not cooperative.  Extensor posturing in the uppers. triple flex in the lowers possible some myoclonus on pain stim lowers.  DTR diminished, toes mute, clonus bilateral AJs. Sensation, coordination and gait not tested.   ASSESSMENT/PLAN [name] is a 33 y.o. male with history of [name] is a 33 y.o. male with history of hypertension, end-stage renal disease on ASA+Plavix for recent stroke presents to the emergency department obtunded with left-sided plegia and right gaze deviation.CT head showed 60 cc hematoma in the right basal ganglia with intraventricular extension, hydrocephalus, and 1 cm of midline shift.Patient was emergency intubated mainly for airway protection. EVD was emergently placed at bedside by [place] for hydrocephalus. Patient also was given desmopressin given the fact that he is using Plavix with small studies supporting its use in the setting of antiplatelets diminishing incidence of hemorrhage expansion.  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 Mass effect. 41m L midline shift. IVH increased in R lateral ventricle atrium. R EVD  Repeat CT head - 10/13/19 - unchanged R basal ganglia ICH and IVH. Improved ventriculomegaly. Stable 1cm midline shift.  [facility] Unchanged size of large intraparenchymal hematoma centered in the right basal ganglia with intraventricular extension.Decreased size of the lateral ventricles with unchanged 6 mm of leftward midline shift.  Repeat CT head 11/2 no significant change in hemorrhage, 711mmidline shift. EVD w/ stable ventricle size. Slight decreased cortical sulcation suggesting worsening edema.  Repeat CT 11/3 no change in hemorrhage or mass effect, 64m39midline shift.EVD. no change in ventricles  Repeat CT head 11/6 - unchanged basal ganglia hemorrhage 64mm54mdline shift.   CT Head WO Contrast - 10/21/2019 - stable (see above) leftward midline shift of 8-9 mm.  2D Echo - EF >65%. No source of embolus   EEG no sz.  Repeat EEG no sz, diffused encephalopathy  SarsHilton Hotelsus 2  neg  LDL - 158 in July  HgbA1c 4.9 in July    UDS neg  VTE prophylaxis - SCDs only d/t anemia needing transfusion  ASA + Plavix prior to admission, now not indicated  Therapy recommendations:  Pending (need to reorder when ready)  Disposition:  Pending   Cerebral Edema Obstructive hydrocephalus  EVD placed in ED (Ostergard)  NA 147  Goal Na 150-155  3% saline off   Neurosurgery on board  Ventricles remain open. NSG does not feel neuro worsening r/t hydrocephalus  EVD clamp again 11/5 - opened   Repeat CT 11/6 unchanged basal ganglia hemorrhage 64mm 32mline shift. Stable ventricular volume  EVD clamped -> opened 11/7 per Dr CabbeChristella Noarsening neuro exam  CT Head WO Contrast -  10/21/2019 - stable (see above) leftward midline shift of 8-9 mm.  Acute Hypoxemic Respiratory Failure  Intubated  On low dose sedation -> respiratory distress -> increased sedation  Unable to extubate d/t mental status  CCM on board  Likely need trach and PEG   Hypertensive Emergency  BP as high as 194/105 on arrival  Home BP meds: norvasc 10, coreg 25, hydralazine 100 tid, sponolactone 25, minoxidil 5 bid  Now on norvasc 10, coreg 25, hydralazine 100 tid, sponolactone 25, minoxidil increased to 10mg 36mand clonidine  Increased  to 0.2 tid (10/20/19)  Goal SBP <140   Off cleviprex now  Nephrology on board  Long-term BP goal normotensive  SBP - 115s - 180s today - monitor closely - may need to increase clonidine  Hx stroke/TIA ? 06/2019 admitted for confusion and hypertensive encephalopathy.  MRI showed numerous bilateral anterior and posterior circulation infarcts throughout.  MRA head and neck unremarkable.  EF > 65%.  LDL 158, A1c 4.9.  UDS positive for cocaine and THC.  Discharged on DAPT      There are no active orders of the following types: Diet, Nourishments.     none prior to admission, now on none.   Therapy recommendations:  Pending   Disposition:  Pending    Hyperlipidemia  Home meds none   LDL 158, goal < 70  Hold statin 2/2 ICH   Consider statin at discharge    Other Stroke Risk Factors  Cigarette smoker advised to stop smoking  ETOH use,  advised to drink no more than 2 drink(s) a day. Quit in [place]  Substance abuse UDS: .hx of cocaine   Other Active Problems ESRD on HD   MWF schedule  Renal following  Had HD 10/20/19  Anemia d/t CKD, hx duodenitis  Hb 7.1->8.0->7.7->7.4->6.4->6.9->8.3->8.8->7.2->9.2->8.0->8.0->6.2->6.9->transfused->6.5->transfused->7.4  Received PRBC transfusion x 2 on 11/2, x 1 on 11/6 and 11/7  EGD in Sept w/ reactive gastropathy and peptic duodenitis  Stool occult blood +  On PPI gtt  GI  consult recommended maximizing medical therapy, unable to see much with neonatal scope and not able to perform any therapies via neonatal scope. Recommend transfuse as indicated - they have signed off and call back after extubation  CBC monitoring  Dysphagia Malnutrition, moderate    Secondary to stroke  NPO  On TF _0  via OG  Tobacco abuse  Current smoker  Smoking cessation counseling will be provided Hospital day # 13  Beulah Gandy, NP I have personally obtained history,examined this patient, reviewed notes, independently viewed imaging studies, participated in medical decision making and plan of care.ROS completed by me personally and pertinent positives fully documented  I have made any additions or clarifications directly to the above note. Agree with note above. .I met with the patient's wife at the bedside who has now accepted the reality that patient is unlikely to survive and have meaningful quality of life and agrees to DNR and will likely make him comfort care in the next few days after family member serves have said goodbye This patient is critically ill and at significant risk of neurological worsening, death and care requires constant monitoring of vital signs, hemodynamics,respiratory and cardiac monitoring, extensive review of multiple databases, frequent neurological assessment, discussion with family, other specialists and medical decision making of high complexity.I have made any additions or clarifications directly to the above note.This critical care time does not reflect procedure time, or teaching time or supervisory time of PA/NP/Med Resident etc but could involve care discussion time.  I spent 40 minutes of neurocritical care time  in the care of  this patient.      Antony Contras, MD Medical Director [facility] Pager: [phone] 10/24/2019 3:00 PM  To contact Stroke Continuity provider, please refer to [email or web address] After hours, contact  General Neurology

## 2019-10-24 NOTE — Progress Notes (Signed)
Responded to [facility] Consult to support patient and family.  Wife at bedside.  Per nurse wife just made patient DNR and tomorrow patient will become comfort care.  Wife said she was at peace with her decision.  Wife is waiting on patient mother to arise and other persons then they will go forward with next step.  Prayed with wife and offered continued support as needed.  Provided emotional and spiritual support, ministry of presence and empathic listening.  Chaplain will follow as needed.    [name], [place], [facility], Pager [phone]

## 2019-10-24 NOTE — Progress Notes (Signed)
Bruising, edema, and small abrasion noted above the right eye. Dr. Carlis Abbott at bedside. Paged Dr. Leonie Man to notify also. Orders to pause heparin gtt.

## 2019-10-24 NOTE — Progress Notes (Signed)
NAME:  William [name], MRN:  [id number], DOB:  1986-06-20, LOS: 59 ADMISSION DATE:  09/29/2019, CONSULTATION DATE:  09/24/2019 REFERRING MD:  Ward  CHIEF COMPLAINT:  AMS.   Brief History   33 yo male smoker presented with dyspnea, slurred speech, facial droop, AMS, Lt side weakness.  Found to have very large Rt BG ICH with IVH, cerebral edema and hydrocephalus.  Required intubation for airway.  Had EVD placed.  Past Medical History  CVA, HTN, ESRD, Cocaine abuse, Anemia  Significant Hospital Events   10/28 admit, EVD placed 11/01 transfuse PRBC 11/02 GI consult, start protonix gtt 11/03 attempted EGD >> procedure aborted due to unusual upper airway anatomy 11/05 Tm 101.5, blood cultures sent 11/06 transfuse PRBC  Consults:  Neurosurgery Neprhology  GI >> s/o 11/05  Procedures:  ETT 10/28 >> Left IJ CVL 10/28 >>  Significant Diagnostic Tests:  CT head10/28 >> 6.8 x 3.6 x 4.9 cm Rt BG ICH with IV extension EEG 11/05 >> generalized slowing  Micro Data:  SARS CoV2 10/28 >> negative Blood 11/05 >>  Penis culture 11/8>> mod GPC in pairs, abundant WBC, mostly PMN>>  Antimicrobials:  Ceftriaxone 10/31 >> 11/04  Interim history/subjective:  Involuntary LUE twitching. L deviated gaze Is not purposefully moving or following commands.   Wife is at bedside and communicates desire to transition to comfort care   Objective:  Blood pressure (!) 177/90, pulse 81, temperature 98.7 F (37.1 C), temperature source Axillary, resp. rate (!) 29, weight 89.1 kg, SpO2 100 %.    Vent Mode: CPAP;PSV FiO2 (%):  [35 %-40 %] 40 % Set Rate:  [18 bmp] 18 bmp Vt Set:  [560 mL] 560 mL PEEP:  [5 cmH20] 5 cmH20 Pressure Support:  [8 cmH20-10 cmH20] 8 cmH20 Plateau Pressure:  [16 cmH20-21 cmH20] 21 cmH20   Intake/Output Summary (Last 24 hours) at 10/24/2019 1044 Last data filed at 10/24/2019 0600 Gross per 24 hour  Intake 1793.39 ml  Output 4166 ml  Net -2372.61 ml   Filed Weights   10/23/19 1130 10/24/19 0500 10/24/19 0715  Weight: 91.1 kg 89.7 kg 89.1 kg    Examination:  General - chronically ill appearing young adult M, intubated and lightly sedated, NAD  HEENT: Anicteric sclera. Pink mmm. Thin, clear secretions. ETT OGT secure. Cardiac: s1s2 no rgm.   Pulm: diminished bibasilar sounds. Symmetrical chest expansion. No accessory muscle use.  Abdomen: Soft round, ndnt. + bowel sounds  Extremities: Symmetrical bulk and tone. No obvious joint deformity. No cyanosis or clubbing  Skin: clean, dry, warm, without rash  Neuro: LUE flexion posturing to stimulation. L gaze deviation. Does not respond to verbal stimuli. Does not follow commands   Resolved problems:  UTI  Assessment & Plan:   Goals of Care: -11/10 Advised by Dr. Leonie Man that patient's wife has elected to make the patient DNR, and is contemplating comfort care measures -11/10,  The wife wishes to discuss transitioning to comfort care. We revisit her recent conversation with Dr. Leonie Man at which time she was contemplative regarding this decision. In my conversation, she states she has made up her mind. She feel that the patient is "no longer fully here and this stroke is just very different than before. I can tell his body is tired of having all of this done."  We discuss what a transition to comfort care would entail. She articulates that her wishes are to focus of patient's comfort and to compassionately liberate from ventilator when able. We discussed that  with this transition in care, further labs and imaging will not be collected, and medical interventions not aimed at comfort will be ceased. We discussed code status, and confirmed DNR status.  Plan -DNR order placed  -Will place comfort care order set (of note, some antihypertensives to remain)  -Morphine, PRN ativan, robinul  -Extubate when wife indicates   Acute hypoxemic respiratory failure requiring mechanical ventilation -Plan to compassionately  extubate when wife is ready   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, edema and hydrocephalus s/p EVD History of CVA P -Neurology is primary -Wife understands poor prognosis -Transition to comfort care as above - No further Na checks   Hypertensive emergency Hyperlipidemia -No SBP goal  - With patient's very extensive antihypertensive regimen, PCCM physician will leave some scheduled antihypertensive medications on MAR   ESRD -no further iHD   Acute GI bleed, upper Anemia of critical illness and chronic disease  -hgb 7.4 from 7.9. relatively stable x several days in this general range  P -no further CBC monitoring or transfusions   Metabolic encephalopathy History of cocaine abuse>> UDS negative this admission -No further interventions     Best Practice:  Diet: dc EN DVT prophylaxis: dc SCD GI prophylaxis: dc SCD Mobility: BR  Code Status: DNR Disposition: ICU  Labs:   CMP Latest Ref Rng & Units 10/24/2019 10/24/2019 10/24/2019  Glucose 70 - 99 mg/dL 134(H) - -  BUN 6 - 20 mg/dL 99(H) - -  Creatinine 0.61 - 1.24 mg/dL 8.85(H) - -  Sodium 135 - 145 mmol/L 147(H) 146(H) 148(H)  Potassium 3.5 - 5.1 mmol/L 5.4(H) - -  Chloride 98 - 111 mmol/L 110 - -  CO2 22 - 32 mmol/L 21(L) - -  Calcium 8.9 - 10.3 mg/dL 9.2 - -  Total Protein 6.5 - 8.1 g/dL - - -  Total Bilirubin 0.3 - 1.2 mg/dL - - -  Alkaline Phos 38 - 126 U/L - - -  AST 15 - 41 U/L - - -  ALT 0 - 44 U/L - - -   CBC Latest Ref Rng & Units 10/24/2019 10/23/2019 10/22/2019  WBC 4.0 - 10.5 K/uL 13.0(H) 12.3(H) 12.2(H)  Hemoglobin 13.0 - 17.0 g/dL 7.4(L) 7.9(L) 7.7(L)  Hematocrit 39.0 - 52.0 % 24.3(L) 26.6(L) 25.8(L)  Platelets 150 - 400 K/uL 220 264 250   ABG    Component Value Date/Time   PHART 7.408 11/04/2019 1743   PCO2ART 37.3 10/29/2019 1743   PO2ART 102.0 10/18/2019 1743   HCO3 23.2 11/05/2019 1743   TCO2 24 11/13/2019 1743   ACIDBASEDEF 1.0 11/05/2019 1743    O2SAT 98.0 11/08/2019 1743   CBG (last 3)  Recent Labs    10/23/19 2308 10/24/19 0321 10/24/19 0737  GLUCAP 107* 125* 126*   CRITICAL CARE Performed by: Cristal Generous   Total critical care time: 35  minutes  Critical care time was exclusive of separately billable procedures and treating other patients. Critical care was necessary to treat or prevent imminent or life-threatening deterioration.  Critical care was time spent personally by me on the following activities: development of treatment plan with patient and/or surrogate as well as nursing, discussions with consultants, evaluation of patient's response to treatment, examination of patient, obtaining history from patient or surrogate, ordering and performing treatments and interventions, ordering and review of laboratory studies, ordering and review of radiographic studies, pulse oximetry and re-evaluation of patient's condition.  Eliseo Gum MSN, AGACNP-BC [place] [id number] If no  answer, [id number] 10/24/2019, 10:44 AM

## 2019-10-25 LAB — AEROBIC CULTURE W GRAM STAIN (SUPERFICIAL SPECIMEN)

## 2019-10-25 MED ORDER — FENTANYL 2500MCG IN NS 250ML (10MCG/ML) PREMIX INFUSION
0.0000 ug/h | INTRAVENOUS | Status: DC
  Administered 2019-10-25: 25 ug/h via INTRAVENOUS
  Administered 2019-10-26: 250 ug/h via INTRAVENOUS
  Filled 2019-10-25 (×2): qty 250

## 2019-10-25 NOTE — Progress Notes (Signed)
65mLs of IV morphine wasted with My, RN.

## 2019-10-25 NOTE — Progress Notes (Signed)
Nutrition Brief Note  Chart reviewed. Plan to transition to comfort care.  TF's d/c'ed. Terminal extubation planned for this week per MD.  No further nutrition interventions warranted at this time.  Please re-consult as needed.   [place], [place], [place] Pager [phone] After Hours Pager

## 2019-10-25 NOTE — Progress Notes (Addendum)
STROKE TEAM PROGRESS NOTE   INTERVAL HISTORY His wife is  at the bedside.  He remains on respiratory failure requiring ventilatory support and on sedation for comfort.  He remains unresponsive eyes with disconjugate gaze. Patient intubated. Not following commands. Patient will become comfort care soon when family has had a chance to say good bye.  Patient now transitioned to fentanyl and seems to be resting more peacefully Vitals:   10/25/19 0500 10/25/19 0600 10/25/19 0700 10/25/19 0757  BP: (!) 120/55 (!) 120/56 (!) 119/54   Pulse: (!) 141 68 69 69  Resp: 18 18 18 18   Temp:      TempSrc:      SpO2: 100% 100% 100% 100%  Weight:        CBC:  Recent Labs  Lab 10/23/19 0856 10/24/19 0813  WBC 12.3* 13.0*  HGB 7.9* 7.4*  HCT 26.6* 24.3*  MCV 99.6 97.2  PLT 264 [id number]    Basic Metabolic Panel:  Recent Labs  Lab 10/21/19 0407  10/23/19 0856  10/24/19 0515 10/24/19 0814  NA 154*   < > 157*   < > 146* 147*  K 4.9   < > 6.0*  --   --  5.4*  CL 118*   < > 123*  --   --  110  CO2 21*   < > 18*  --   --  21*  GLUCOSE 151*   < > 129*  --   --  134*  BUN 83*   < > 137*  --   --  99*  CREATININE 7.26*   < > 10.99*  --   --  8.85*  CALCIUM 8.9   < > 9.4  --   --  9.2  PHOS 5.2*  --   --   --   --  6.6*   < > = values in this interval not displayed.   Lipid Panel:     Component Value Date/Time   CHOL 253 (H) 06/23/2019 0301   TRIG 273 (H) 10/24/2019 0515   HDL 67 06/23/2019 0301   CHOLHDL 3.8 06/23/2019 0301   VLDL 28 06/23/2019 0301   LDLCALC 158 (H) 06/23/2019 0301   HgbA1c:  Lab Results  Component Value Date   HGBA1C 4.9 06/23/2019   Urine Drug Screen:     Component Value Date/Time   LABOPIA NONE DETECTED 09/22/2019 0648   COCAINSCRNUR NONE DETECTED 10/10/2019 0648   LABBENZ NONE DETECTED 10/10/2019 0648   AMPHETMU NONE DETECTED 09/16/2019 0648   THCU NONE DETECTED 10/10/2019 0648   LABBARB NONE DETECTED 09/21/2019 0648    Alcohol Level No results found for:  ETH  IMAGING Ct Head Wo Contrast 10/20/2019 1. Unchanged large right basal ganglia hemorrhage with lateral and third ventricular extension. Stable swelling and 7 mm of midline shift. Stable ventricular volume. 2. Background chronic small vessel ischemia.   CT Head WO Contrast 10/21/2019 IMPRESSION: 1. Stable large right basal ganglia hemorrhage with intraventricular extension since 10/20/2019. Stable regional edema and intracranial mass effect with leftward midline shift of 8-9 mm. 2. Stable right EVD which appears to communicate with the right lateral ventricle. Stable ventricle size and configuration. 3. No new intracranial abnormality.  Ct Head Wo Contrast 11/06/2019 2653 1. No change in size or appearance of a large intraparenchymal hemorrhage centered in the right basal ganglia with extension into the lateral ventricles. 2. Unchanged edema and mass effect with approximately 7 mm right-to-left midline shift. 3. Unchanged caliber and  configuration of the lateral ventricles with layering internal blood product and a right frontal approach shunt catheter.   CT Head Wo Contrast 10/16/2019 1. No significant interval change in size and appearance of right basal ganglia hemorrhage with intraventricular extension. Stable regional mass effect with up to 7 mm of localized right-to-left midline shift. 2. Stable right frontal approach ventriculostomy with tip terminating near the right thalamus. Stable ventricular size and morphology. 3. Slightly decreased cortical sulcation throughout the brain as compared to previous, suggesting worsened cerebral edema. 4. No other new acute intracranial abnormality.  CT Head Wo Contrast 10/15/2019 1. Unchanged size of large intraparenchymal hematoma centered in the right basal ganglia with intraventricular extension. 2. Decreased size of the lateral ventricles with unchanged 6 mm of leftward midline shift. 3. No new site of hemorrhage.  Ct Head Wo  Contrast 10/13/2019 1. Unchanged right basal ganglia and intraventricular clot.  2. Improved ventriculomegaly.  3. Stable 1 cm leftward midline shift.   Dg Chest Port 1 View 10/20/2019 1. Mild diffuse bilateral airspace opacification may be due to edema or pneumonia. 2. Improving left lower lobe consolidation.   Dg Hand 2 View Right 11/11/2019 Soft tissue enlargement of the proximal right second digit without adjacent osseous or joint abnormality. This was present but smaller on the 2016 study and is likely benign with a nonaggressive appearance.     Dg Chest Port 1 View 10/13/2019 1. Unremarkable hardware positioning.  2. Improved aeration but persistent dense opacity behind the heart.   Transthoracic Echocardiogram  1. The left ventricle has hyperdynamic systolic function, with an ejection fraction of >65%. The cavity size was decreased. There is severe concentric left ventricular hypertrophy. Left ventricular diastolic parameters were normal. No evidence of left  ventricular regional wall motion abnormalities.  2. The right ventricle has normal systolic function. The cavity was normal. There is no increase in right ventricular wall thickness. Right ventricular systolic pressure could not be assessed.  3. Small pericardial effusion.  4. No evidence of mitral valve stenosis.  5. The aortic valve is tricuspid. No stenosis of the aortic valve.  EEG 10/15/2019 This is an abnormal electroencephalogram secondary to focal right hemispheric slowing superimposed on a diffusely slow background. Occasional right frontal sharp waves are noted as well. These findings are consistent with the patient's history of right ICH with right EVD placement. No subclinical seizure activity is noted.   PHYSICAL EXAM   Vitals:   10/25/19 0700 10/25/19 0757  BP: (!) 119/54   Pulse: 69 69  Resp: 18 18  Temp:    SpO2: 100% 100%  General- obese middle aged african [place] male, intubated on  sedation, mild respiratory distress.   Neuro -  intubated on sedation,  Eyes are open but not tracking, not following commands, eyes in upper gaze position. Not blinking to visual threat bilaterally, PERRL. Corneal reflex weakly present, gag and cough present. Breathing over the vent. Facial symmetry not able to test due to ET tube. Tongue protrusion not cooperative.  Only trace withdrawal in the extremities right greater than left.. DTR diminished, toes mute, clonus bilateral AJs. Sensation, coordination and gait not tested.   ASSESSMENT/PLAN [name] is a 33 y.o. male with history of [name] is a 33 y.o. male with history of hypertension, end-stage renal disease on ASA+Plavix for recent stroke presents to the emergency department obtunded with left-sided plegia and right gaze deviation.CT head showed 60 cc hematoma in the right basal ganglia with intraventricular extension,  hydrocephalus, and 1 cm of midline shift.Patient was emergency intubated mainly for airway protection. EVD was emergently placed at bedside by [place] for hydrocephalus. Patient also was given desmopressin given the fact that he is using Plavix with small studies supporting its use in the setting of antiplatelets diminishing incidence of hemorrhage expansion.  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 Mass effect. 14mm L midline shift. IVH increased in R lateral ventricle atrium. R EVD  Repeat CT head - 10/13/19 - unchanged R basal ganglia ICH and IVH. Improved ventriculomegaly. Stable 1cm midline shift.  [facility] 10/15/19 - Unchanged size of large intraparenchymal hematoma centered in the right basal ganglia with intraventricular extension.Decreased size of the lateral ventricles with unchanged 6 mm of leftward  midline shift.  Repeat CT head 11/2 no significant change in hemorrhage, 78mm midline shift. EVD w/ stable ventricle size. Slight decreased cortical sulcation suggesting worsening edema.  Repeat CT 11/3 no change in hemorrhage or mass effect, 34mm midline shift.EVD. no change in ventricles  Repeat CT head 11/6 - unchanged basal ganglia hemorrhage 62mm midline shift.   CT Head WO Contrast - 10/21/2019 - stable (see above) leftward midline shift of 8-9 mm.  2D Echo - EF >65%. No source of embolus   EEG no sz.  Repeat EEG no sz, diffused encephalopathy  [employer] Virus 2  neg  LDL - 158 in July  HgbA1c 4.9 in July    UDS neg  VTE prophylaxis - SCDs only d/t anemia needing transfusion  ASA + Plavix prior to admission, now not indicated  Therapy recommendations:  Pending (need to reorder when ready)  Disposition:  Pending   Cerebral Edema Obstructive hydrocephalus  EVD placed in ED (Ostergard)  NA 147  Goal Na 150-155  3% saline off   Neurosurgery on board  Ventricles remain open. NSG does not feel neuro worsening r/t hydrocephalus  EVD clamp again 11/5 - opened   Repeat CT 11/6 unchanged basal ganglia hemorrhage 84mm midline shift. Stable ventricular volume  EVD clamped -> opened 11/7 per Dr Christella Noa - worsening neuro exam  CT Head WO Contrast - 10/21/2019 - stable (see above) leftward midline shift of 8-9 mm.  Acute Hypoxemic Respiratory Failure  Intubated  On low dose sedation -> respiratory distress -> increased sedation  Unable to extubate d/t mental status  CCM on board  Likely need trach and PEG   Hypertensive Emergency  BP as high as 194/105 on arrival  Home BP meds: norvasc 10, coreg 25, hydralazine 100 tid, sponolactone 25, minoxidil 5 bid  Now on norvasc 10, coreg 25, hydralazine 100 tid, sponolactone 25, minoxidil increased to 10mg  bid and clonidine  Increased to 0.2 tid (10/20/19)  Goal SBP <140   Off cleviprex now  Nephrology on  board  Long-term BP goal normotensive  SBP - 115s - 180s today - monitor closely - may need to increase clonidine  Hx stroke/TIA ? 06/2019 admitted for confusion and hypertensive encephalopathy.  MRI showed numerous bilateral anterior and posterior circulation infarcts throughout.  MRA head and neck unremarkable.  EF > 65%.  LDL 158, A1c 4.9.  UDS positive for cocaine and THC.  Discharged on DAPT      There are no active orders of the following types: Diet, Nourishments.  none prior to admission, now on none.   Therapy recommendations:  Pending   Disposition:  Pending    Hyperlipidemia  Home meds none   LDL 158, goal < 70  Hold statin 2/2 ICH   Consider statin at discharge    Other Stroke Risk Factors  Cigarette smoker advised to stop smoking  ETOH use,  advised to drink no more than 2 drink(s) a day. Quit in [place]  Substance abuse UDS: .hx of cocaine   Other Active Problems ESRD on HD   MWF schedule  Renal following  Had HD 10/20/19  Anemia d/t CKD, hx duodenitis  Hb 7.1->8.0->7.7->7.4->6.4->6.9->8.3->8.8->7.2->9.2->8.0->8.0->6.2->6.9->transfused->6.5->transfused->7.4  Received PRBC transfusion x 2 on 11/2, x 1 on 11/6 and 11/7  EGD in Sept w/ reactive gastropathy and peptic duodenitis  Stool occult blood +  On PPI gtt  GI consult recommended maximizing medical therapy, unable to see much with neonatal scope and not able to perform any therapies via neonatal scope. Recommend transfuse as indicated - they have signed off and call back after extubation  CBC monitoring  Dysphagia Malnutrition, moderate    Secondary to stroke  NPO  On TF @55  via OG  Tobacco abuse  Current smoker  Smoking cessation counseling will be provided Hospital day # [place], MSN, NP-C [facility] [phone] I have personally obtained history,examined this patient, reviewed notes, independently viewed imaging studies, participated  in medical decision making and plan of care.ROS completed by me personally and pertinent positives fully documented  I have made any additions or clarifications directly to the above note.  Patient has been made DNR upon wife's request and would be made comfort care with withdrawal of ventilatory support likely tomorrow.  Patient's mother is planning to visit him today to say goodbye.  He has been changed to fentanyl drip and seems comfortable on it.  Discussed with wife at bedside seems at peace with her decision.  Discussed with Dr. Ernest Mallick critical care medicine. This patient is critically ill and at significant risk of neurological worsening, death and care requires constant monitoring of vital signs, hemodynamics,respiratory and cardiac monitoring, extensive review of multiple databases, frequent neurological assessment, discussion with family, other specialists and medical decision making of high complexity.I have made any additions or clarifications directly to the above note.This critical care time does not reflect procedure time, or teaching time or supervisory time of PA/NP/Med Resident etc but could involve care discussion time.  I spent 30 minutes of neurocritical care time  in the care of  this patient.    Antony Contras, MD Medical Director [facility] Pager: [phone] 10/25/2019 11:35 AM  To contact Stroke Continuity provider, please refer to [email or web address] After hours, contact General Neurology

## 2019-10-25 NOTE — Progress Notes (Signed)
NAME:  William [name], MRN:  [id number], DOB:  1986/05/14, LOS: 20 ADMISSION DATE:  10/12/2019, CONSULTATION DATE:  09/22/2019 REFERRING MD:  Ward  CHIEF COMPLAINT:  AMS  Brief History   33 yo male smoker presented with dyspnea, slurred speech, facial droop, AMS, Lt side weakness.  Found to have very large Rt BG ICH with IVH, cerebral edema and hydrocephalus.  Required intubation for airway.  Had EVD placed.  Past Medical History  CVA, HTN, ESRD, Cocaine abuse, Anemia  Significant Hospital Events   10/28 admit, EVD placed 11/01 transfuse PRBC 11/02 GI consult, start protonix gtt 11/03 attempted EGD >> procedure aborted due to unusual upper airway anatomy 11/05 Tm 101.5, blood cultures sent 11/06 transfuse PRBC 11/10 dialysis, decision made to pursue comfort based care  Consults:  Neurosurgery Neprhology  GI >> s/o 11/05  Procedures:  ETT 10/28 >> Left IJ CVL 10/28 >>  Significant Diagnostic Tests:  CT head10/28 >> 6.8 x 3.6 x 4.9 cm Rt BG ICH with IV extension EEG 11/05 >> generalized slowing  Micro Data:  SARS CoV2 10/28 >> negative Blood 11/05 >>  Penis culture 11/8>> mod GPC in pairs, abundant WBC, mostly PMN>>  Antimicrobials:  Ceftriaxone 10/31 >> 11/04  Interim history/subjective:  On morphine infusion.  Wife is decided that she would not want to proceed with tracheostomy would like to pursue comfort based care.  Planning for terminal extubation this week.  Objective:  Blood pressure (!) 121/58, pulse 70, temperature 98.7 F (37.1 C), temperature source Axillary, resp. rate 18, weight 85.1 kg, SpO2 100 %.    Vent Mode: PRVC FiO2 (%):  [40 %] 40 % Set Rate:  [18 bmp] 18 bmp Vt Set:  [560 mL] 560 mL PEEP:  [5 cmH20] 5 cmH20 Pressure Support:  [8 cmH20] 8 cmH20 Plateau Pressure:  [16 cmH20-21 cmH20] 21 cmH20   Intake/Output Summary (Last 24 hours) at 10/25/2019 0851 Last data filed at 10/25/2019 0800 Gross per 24 hour  Intake 1256.76 ml  Output 4000 ml   Net -2743.24 ml   Filed Weights   10/24/19 0500 10/24/19 0715 10/24/19 1030  Weight: 89.7 kg 89.1 kg 85.1 kg    Examination:  General -chronically ill-appearing young man lying in bed, intubated, not responsive.  Appears comfortable HEENT-staples and sutures on the right side, eyes anicteric. Cardiac -S1-S2, regular rate and rhythm, no murmurs Chest -clear to auscultation bilaterally, breathing comfortably on the vent Abdomen -soft, nontender, nondistended Extremities -edema mildly improved from previous exams skin -no rashes or wounds Neuro -sleeping comfortably, not arousing to light stimulation   Resolved problems:  UTI  Assessment & Plan:   Acute hypoxemic respiratory failure requiring mechanical ventilation -Continue LTVV, 8 cc/kg ideal body weight, goal plateau less than 30 and driving pressure less than 15 -Planning for terminal extubation later this week with family -Suction oral care for comfort  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, edema and hydrocephalus s/p EVD History of CVA -Very poor prognosis long-term; agree with comfort based approach to his care given minimal recovery  Hypertensive emergency Hyperlipidemia -Continue oral antihypertensives with liberal blood pressure goals  End-stage renal disease -Switch morphine to fentanyl infusion  Acute upper GI bleed Anemia of critical illness Anemia of chronic disease -No additional treatment indicated  Metabolic encephalopathy History of cocaine abuse>> UDS negative this admission -Fentanyl for pain control   Best Practice:   DVT prophylaxis: SCDs GI prophylaxis: Protonix Mobility: Bedrest Code Status: Full code Disposition: ICU  Labs:  CMP Latest Ref Rng & Units 10/24/2019 10/24/2019 10/24/2019  Glucose 70 - 99 mg/dL 134(H) - -  BUN 6 - 20 mg/dL 99(H) - -  Creatinine 0.61 - 1.24 mg/dL 8.85(H) - -  Sodium 135 - 145 mmol/L 147(H) 146(H) 148(H)   Potassium 3.5 - 5.1 mmol/L 5.4(H) - -  Chloride 98 - 111 mmol/L 110 - -  CO2 22 - 32 mmol/L 21(L) - -  Calcium 8.9 - 10.3 mg/dL 9.2 - -  Total Protein 6.5 - 8.1 g/dL - - -  Total Bilirubin 0.3 - 1.2 mg/dL - - -  Alkaline Phos 38 - 126 U/L - - -  AST 15 - 41 U/L - - -  ALT 0 - 44 U/L - - -   CBC Latest Ref Rng & Units 10/24/2019 10/23/2019 10/22/2019  WBC 4.0 - 10.5 K/uL 13.0(H) 12.3(H) 12.2(H)  Hemoglobin 13.0 - 17.0 g/dL 7.4(L) 7.9(L) 7.7(L)  Hematocrit 39.0 - 52.0 % 24.3(L) 26.6(L) 25.8(L)  Platelets 150 - 400 K/uL 220 264 250   ABG    Component Value Date/Time   PHART 7.408 10/16/2019 1743   PCO2ART 37.3 11/09/2019 1743   PO2ART 102.0 11/05/2019 1743   HCO3 23.2 11/08/2019 1743   TCO2 24 10/16/2019 1743   ACIDBASEDEF 1.0 10/16/2019 1743   O2SAT 98.0 10/24/2019 1743   CBG (last 3)  Recent Labs    10/23/19 2308 10/24/19 0321 10/24/19 0737  GLUCAP 107* 125* 126*   This patient is critically ill with multiple organ system failure which requires frequent high complexity decision making, assessment, support, evaluation, and titration of therapies. This was completed through the application of advanced monitoring technologies and extensive interpretation of multiple databases. During this encounter critical care time was devoted to patient care services described in this note for 30 minutes.  Julian Hy, DO 10/25/19 8:59 AM  Pulmonary & Critical Care

## 2019-11-12 ENCOUNTER — Telehealth: Payer: Self-pay | Admitting: Neurology

## 2019-11-14 NOTE — Progress Notes (Signed)
STROKE TEAM PROGRESS NOTE   INTERVAL HISTORY Patient's wife is at the bedside.  He remains sedated with fentanyl drip with eyes open but unresponsive.  No neurological changes.  Patient's mother met with him yesterday and family has plan for terminal extubation for noon today  Vitals:   October 28, 2019 0500 2019-10-28 0600 2019-10-28 0736 Oct 28, 2019 0800  BP: 132/69 136/72 133/69 133/73  Pulse: 75 71  69  Resp: _0 Temp:      TempSrc:      SpO2: 100% 100% 100% 100%  Weight:       CBC:  Recent Labs  Lab 10/23/19 0856 10/24/19 0813  WBC 12.3* 13.0*  HGB 7.9* 7.4*  HCT 26.6* 24.3*  MCV 99.6 97.2  PLT 264 324   Basic Metabolic Panel:  Recent Labs  Lab 10/21/19 0407  10/23/19 0856  10/24/19 0515 10/24/19 0814  NA 154*   < > 157*   < > 146* 147*  K 4.9   < > 6.0*  --   --  5.4*  CL 118*   < > 123*  --   --  110  CO2 21*   < > 18*  --   --  21*  GLUCOSE 151*   < > 129*  --   --  134*  BUN 83*   < > 137*  --   --  99*  CREATININE 7.26*   < > 10.99*  --   --  8.85*  CALCIUM 8.9   < > 9.4  --   --  9.2  PHOS 5.2*  --   --   --   --  6.6*   < > = values in this interval not displayed.   Lipid Panel:     Component Value Date/Time   CHOL 253 (H) 06/23/2019 0301   TRIG 273 (H) 10/24/2019 0515   HDL 67 06/23/2019 0301   CHOLHDL 3.8 06/23/2019 0301   VLDL 28 06/23/2019 0301   LDLCALC 158 (H) 06/23/2019 0301   HgbA1c:  Lab Results  Component Value Date   HGBA1C 4.9 06/23/2019   Urine Drug Screen:     Component Value Date/Time   LABOPIA NONE DETECTED 10/09/2019 0648   COCAINSCRNUR NONE DETECTED 10/06/2019 0648   LABBENZ NONE DETECTED 10/10/2019 0648   AMPHETMU NONE DETECTED 09/18/2019 0648   THCU NONE DETECTED 09/22/2019 0648   LABBARB NONE DETECTED 09/23/2019 0648    Alcohol Level No results found for: ETH  IMAGING Ct Head Wo Contrast 10/20/2019 1. Unchanged large right basal ganglia hemorrhage with lateral and third ventricular extension. Stable swelling and 7 mm of  midline shift. Stable ventricular volume. 2. Background chronic small vessel ischemia.   CT Head WO Contrast 10/21/2019 IMPRESSION: 1. Stable large right basal ganglia hemorrhage with intraventricular extension since 10/20/2019. Stable regional edema and intracranial mass effect with leftward midline shift of 8-9 mm. 2. Stable right EVD which appears to communicate with the right lateral ventricle. Stable ventricle size and configuration. 3. No new intracranial abnormality.  Ct Head Wo Contrast 11/08/2019 2653 1. No change in size or appearance of a large intraparenchymal hemorrhage centered in the right basal ganglia with extension into the lateral ventricles. 2. Unchanged edema and mass effect with approximately 7 mm right-to-left midline shift. 3. Unchanged caliber and configuration of the lateral ventricles with layering internal blood product and a right frontal approach shunt catheter.   CT Head Wo Contrast 10/16/2019 1. No significant interval change  in size and appearance of right basal ganglia hemorrhage with intraventricular extension. Stable regional mass effect with up to 7 mm of localized right-to-left midline shift. 2. Stable right frontal approach ventriculostomy with tip terminating near the right thalamus. Stable ventricular size and morphology. 3. Slightly decreased cortical sulcation throughout the brain as compared to previous, suggesting worsened cerebral edema. 4. No other new acute intracranial abnormality.  CT Head Wo Contrast 10/15/2019 1. Unchanged size of large intraparenchymal hematoma centered in the right basal ganglia with intraventricular extension. 2. Decreased size of the lateral ventricles with unchanged 6 mm of leftward midline shift. 3. No new site of hemorrhage.  Ct Head Wo Contrast 10/13/2019 1. Unchanged right basal ganglia and intraventricular clot.  2. Improved ventriculomegaly.  3. Stable 1 cm leftward midline shift.  Dg Chest Port 1  View 10/20/2019 1. Mild diffuse bilateral airspace opacification may be due to edema or pneumonia. 2. Improving left lower lobe consolidation.   Dg Hand 2 View Right 11/02/2019 Soft tissue enlargement of the proximal right second digit without adjacent osseous or joint abnormality. This was present but smaller on the 2016 study and is likely benign with a nonaggressive appearance.     Dg Chest Port 1 View 10/13/2019 1. Unremarkable hardware positioning.  2. Improved aeration but persistent dense opacity behind the heart.   Transthoracic Echocardiogram  1. The left ventricle has hyperdynamic systolic function, with an ejection fraction of >65%. The cavity size was decreased. There is severe concentric left ventricular hypertrophy. Left ventricular diastolic parameters were normal. No evidence of left  ventricular regional wall motion abnormalities.  2. The right ventricle has normal systolic function. The cavity was normal. There is no increase in right ventricular wall thickness. Right ventricular systolic pressure could not be assessed.  3. Small pericardial effusion.  4. No evidence of mitral valve stenosis.  5. The aortic valve is tricuspid. No stenosis of the aortic valve.  EEG 10/15/2019 This is an abnormal electroencephalogram secondary to focal right hemispheric slowing superimposed on a diffusely slow background. Occasional right frontal sharp waves are noted as well. These findings are consistent with the patient's history of right ICH with right EVD placement. No subclinical seizure activity is noted.   PHYSICAL EXAM    General - obese middle aged african [place] male, intubated on sedation, mild respiratory distress. Has dialysis catheter  Ophthalmologic - fundi not visualized due to noncooperation.  Cardiovascular - Regular rhythm and tachycardia.  Neuro - intubated on sedation,  Eyes are open with upward deviation to the right but not tracking, not following  commands, eyes in upper gaze position. Not blinking to visual threat bilaterally, PERRL. Corneal reflex weakly present, gag and cough present. Breathing over the vent.  Facial symmetry not able to test due to ET tube.  Tongue protrusion not cooperative.  Extensor posturing in the uppers. triple flex in the lowers possible some myoclonus on pain stim lowers.  DTR diminished, toes mute, clonus bilateral AJs. Sensation, coordination and gait not tested.   ASSESSMENT/PLAN William [name] is a 33 y.o. male with history of William [name] is a 33 y.o. male with history of hypertension, end-stage renal disease on ASA+Plavix for recent stroke presents to the emergency department obtunded with left-sided plegia and right gaze deviation.CT head showed 60 cc hematoma in the right basal ganglia with intraventricular extension, hydrocephalus, and 1 cm of midline shift.Patient was emergency intubated mainly for airway protection. EVD was emergently placed at bedside by [place] for hydrocephalus.  Patient also was given desmopressin given the fact that he is using Plavix with small studies supporting its use in the setting of antiplatelets diminishing incidence of hemorrhage expansion.  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 Mass effect. 51m L midline shift. IVH increased in R lateral ventricle atrium. R EVD  Repeat CT head - 10/13/19 - unchanged R basal ganglia ICH and IVH. Improved ventriculomegaly. Stable 1cm midline shift.  [facility] Unchanged size of large intraparenchymal hematoma centered in the right basal ganglia with intraventricular extension.Decreased size of the lateral ventricles with unchanged 6 mm of leftward midline shift.  Repeat CT head 11/2 no significant change in hemorrhage, 741mmidline  shift. EVD w/ stable ventricle size. Slight decreased cortical sulcation suggesting worsening edema.  Repeat CT 11/3 no change in hemorrhage or mass effect, 81m70midline shift.EVD. no change in ventricles  Repeat CT head 11/6 - unchanged basal ganglia hemorrhage 81mm681mdline shift.   CT Head WO Contrast - 10/21/2019 - stable (see above) leftward midline shift of 8-9 mm.  2D Echo - EF >65%. No source of embolus   EEG no sz.  Repeat EEG no sz, diffused encephalopathy  SarsHilton Hotelsus 2  neg  LDL - 158 in July  HgbA1c 4.9 in July    UDS neg  VTE prophylaxis - SCDs only d/t anemia needing transfusion  ASA + Plavix prior to admission, now not indicated  Therapy recommendations:  Pending (need to reorder when ready)  Disposition:  Pending  Cerebral Edema Obstructive hydrocephalus  EVD placed in ED (Ostergard)  NA 147  Goal Na 150-155  3% saline off   Neurosurgery on board  Ventricles remain open. NSG does not feel neuro worsening r/t hydrocephalus  EVD clamp again 11/5 - opened   Repeat CT 11/6 unchanged basal ganglia hemorrhage 81mm 28mline shift. Stable ventricular volume  EVD clamped -> opened 11/7 per Dr CabbeChristella Noarsening neuro exam  CT Head WO Contrast - 10/21/2019 - stable (see above) leftward midline shift of 8-9 mm.  Acute Hypoxemic Respiratory Failure  Intubated  On low dose sedation -> respiratory distress -> increased sedation  Unable to extubate d/t mental status  CCM on board  Likely need trach and PEG  Hypertensive Emergency  BP as high as 194/105 on arrival  Home BP meds: norvasc 10, coreg 25, hydralazine 100 tid, sponolactone 25, minoxidil 5 bid  Now on norvasc 10, coreg 25, hydralazine 100 tid, sponolactone 25, minoxidil increased to 10mg 58mand clonidine  Increased to 0.2 tid (10/20/19)  Goal SBP <140   Off cleviprex now  Nephrology on board  Long-term BP goal normotensive  SBP - 115s - 180s today - monitor closely - may  need to increase clonidine  Hx stroke/TIA ? 06/2019 admitted for confusion and hypertensive encephalopathy.  MRI showed numerous bilateral anterior and posterior circulation infarcts throughout.  MRA head and neck unremarkable.  EF > 65%.  LDL 158, A1c 4.9.  UDS positive for cocaine and THC.  Discharged on DAPT  Hyperlipidemia  Home meds none   LDL 158, goal < 70  Hold statin 2/2 ICH   Consider statin at discharge  Other Stroke Risk Factors  Cigarette smoker advised to stop smoking  ETOH use,  advised to drink no more  than 2 drink(s) a day. Quit in [place]  Substance abuse UDS: .hx of cocaine   Other Active Problems ESRD on HD   MWF schedule  Renal following  Had HD 10/20/19  Anemia d/t CKD, hx duodenitis  Hb 7.1->8.0->7.7->7.4->6.4->6.9->8.3->8.8->7.2->9.2->8.0->8.0->6.2->6.9->transfused->6.5->transfused->7.4  Received PRBC transfusion x 2 on 11/2, x 1 on 11/6 and 11/7  EGD in Sept w/ reactive gastropathy and peptic duodenitis  Stool occult blood +  On PPI gtt  GI consult recommended maximizing medical therapy, unable to see much with neonatal scope and not able to perform any therapies via neonatal scope. Recommend transfuse as indicated - they have signed off and call back after extubation  CBC monitoring  Dysphagia Malnutrition, moderate    Secondary to stroke  NPO  On TF _0  via OG  Tobacco abuse  Current smoker  Smoking cessation counseling will be provided  Hospital day # 15 Patient has been made DNR and now soon would go for terminal extubation and comfort care measures only.  Wife was given opportunity to ask questions which were answered.  She appears to be at peace with her decision and other family members will be arriving soon to say their goodbye.  Discussed with Dr. Carlis Abbott critical care medicine This patient is critically ill and at significant risk of neurological worsening, death and care requires constant monitoring of vital signs,  hemodynamics,respiratory and cardiac monitoring, extensive review of multiple databases, frequent neurological assessment, discussion with family, other specialists and medical decision making of high complexity.I have made any additions or clarifications directly to the above note.This critical care time does not reflect procedure time, or teaching time or supervisory time of PA/NP/Med Resident etc but could involve care discussion time.  I spent 30 minutes of neurocritical care time  in the care of  this patient.      Antony Contras, MD Medical Director [facility] Pager: [phone] 11-12-19 8:36 AM  To contact Stroke Continuity provider, please refer to [email or web address] After hours, contact General Neurology

## 2019-11-14 NOTE — Discharge Summary (Signed)
Patient ID: [name] MRN: [id number] DOB/AGE: 1986/10/26 33 y.o.  Admit date: Oct 21, 2019 Death date: 11/06/19  Admission Diagnoses:Sudden onset facial droop, slurred speech and left side   Cause of Death: Respiratory failure secondary to large intracranial hemorrhage secondary to malignant hypertension.  Patient made DNR and comfort care and ventilatory support withdrawn  Pertinent Medical Diagnosis: Active Problems:   ICH (intracerebral hemorrhage) (HCC)   Acute respiratory failure with hypoxia (HCC)   Malnutrition of moderate degree   Cytotoxic brain edema (HCC)   Obstructive hydrocephalus ([place])   Gastrointestinal hemorrhage   Pressure injury of skin   Hospital Course:William [name] is a 33 y.o. male with PMH of HTN, ESRD on dialysis, HLD, prior cocaine abuse with prior stroke with no deficits. Patient woke up at 4.45 am feeling short of breath and woke up his wife, she noticed that his speech become slurred and facial droop. He become worse in front of her. EMS arrived at the scene and patient had right gaze, and left side weakness. BP 200/120 mmHg. On arrival, patient obtunded, not following commands. Moving right side spontaneously and with laboured respirations. Stat CT obtained shows large R BG hemorrhage with midline shift, IVH and mild hydrocephalus. Patient on ASA and Plavix at home.   Patient intubated for airway, labetalol and cleviprex started for BP control. NS consulted immediately. DDAVP ordered as patient on plavix and aspirin.  His ICH score was 4 on admission.  He was admitted to the intensive care unit where blood pressure was tightly controlled using drips.  He was intubated for airway protection and pulmonary critical care service help with ventilator management.  Neurosurgery was consulted and placed a ventriculostomy catheter which initially was not draining but subsequently started draining.  Follow-up CT scan showed stable appearance of intracerebral  hemorrhage and intraventricular hemorrhage and hydrocephalus.  However his neurological condition remained quite poor he was barely responsive and not following commands he was withdrawing on the right but not on the left.  Despite several days of ventilatory support and strict blood pressure control his condition failed to improve.  He also had renal failure and was getting dialysis.  After multiple discussions with his wife she understood his poor prognosis and agreed to DNR and comfort care.  After the family visited him patient was terminally extubated on Nov 05, 2019 and condition gradually declined and he passed away.  Signed: Antony Contras 2019-11-06, 4:06 PM

## 2019-11-14 NOTE — Progress Notes (Signed)
NAME:  William [name], MRN:  [id number], DOB:  06/26/1986, LOS: 66 ADMISSION DATE:  09/16/2019, CONSULTATION DATE:  10/10/2019 REFERRING MD:  Ward  CHIEF COMPLAINT:  AMS  Brief History   33 yo male smoker presented with dyspnea, slurred speech, facial droop, AMS, Lt side weakness.  Found to have very large Rt BG ICH with IVH, cerebral edema and hydrocephalus.  Required intubation for airway.  Had EVD placed.  Past Medical History  CVA, HTN, ESRD, Cocaine abuse, Anemia  Significant Hospital Events   10/28 admit, EVD placed 11/01 transfuse PRBC 11/02 GI consult, start protonix gtt 11/03 attempted EGD >> procedure aborted due to unusual upper airway anatomy 11/05 Tm 101.5, blood cultures sent 11/06 transfuse PRBC 11/10 dialysis, decision made to pursue comfort based care  Consults:  Neurosurgery Neprhology  GI >> s/o 11/05  Procedures:  ETT 10/28 >> Left IJ CVL 10/28 >>  Significant Diagnostic Tests:  CT head10/28 >> 6.8 x 3.6 x 4.9 cm Rt BG ICH with IV extension EEG 11/05 >> generalized slowing  Micro Data:  SARS CoV2 10/28 >> negative Blood 11/05 >>  Penis culture 11/8>> mod GPC in pairs, abundant WBC, mostly PMN>>  Antimicrobials:  Ceftriaxone 10/31 >> 11/04  Interim history/subjective:  On fentanyl infusion.  Family planning for terminal extubation today.  Objective:  Blood pressure (!) 144/77, pulse 84, temperature 98.7 F (37.1 C), temperature source Axillary, resp. rate 18, weight 85.1 kg, SpO2 100 %.    Vent Mode: PRVC FiO2 (%):  [30 %-40 %] 30 % Set Rate:  [18 bmp] 18 bmp Vt Set:  [560 mL] 560 mL PEEP:  [5 cmH20] 5 cmH20 Plateau Pressure:  [18 cmH20-21 cmH20] 21 cmH20   Intake/Output Summary (Last 24 hours) at 06-Nov-2019 1043 Last data filed at Nov 06, 2019 0800 Gross per 24 hour  Intake 131.42 ml  Output -  Net 131.42 ml   Filed Weights   10/24/19 0500 10/24/19 0715 10/24/19 1030  Weight: 89.7 kg 89.1 kg 85.1 kg    Examination:  General  -chronically ill-appearing young man laying in bed lightly sedated, appears comfortable HEENT-eyes anicteric, staples on the right Cardiac -S1-S2, regular rate and rhythm Chest -breathing comfortably on the vent, CTAB Abdomen -nondistended, nontender Extremities -bilateral lower extremity edema, no clubbing or cyanosis skin -no rashes or wounds Neuro -opens eyes spontaneously, not tracking, not responding to commands   Resolved problems:  UTI  Assessment & Plan:   Acute hypoxemic respiratory failure requiring mechanical ventilation -Continue LTV V, 8 cc/kg ideal body weight, goal plateau less than 30 and driving pressure less than 15 -Planning for terminal extubation today -Oral care and suctioning as needed for comfort  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, edema and hydrocephalus s/p EVD History of CVA -Prognosis remains poor  Hypertensive emergency Hyperlipidemia -Continue oral antihypertensives as long as enteral access is available.  End-stage renal disease -Continue fentanyl infusion to maintain comfort  Acute upper GI bleed Anemia of critical illness Anemia of chronic disease -No treatment indicated  Metabolic encephalopathy History of cocaine abuse>> UDS negative this admission -Fentanyl for pain control   Best Practice:    Disposition: ICU  Labs:   CMP Latest Ref Rng & Units 10/24/2019 10/24/2019 10/24/2019  Glucose 70 - 99 mg/dL 134(H) - -  BUN 6 - 20 mg/dL 99(H) - -  Creatinine 0.61 - 1.24 mg/dL 8.85(H) - -  Sodium 135 - 145 mmol/L 147(H) 146(H) 148(H)  Potassium 3.5 - 5.1 mmol/L 5.4(H) - -  Chloride 98 - 111 mmol/L 110 - -  CO2 22 - 32 mmol/L 21(L) - -  Calcium 8.9 - 10.3 mg/dL 9.2 - -  Total Protein 6.5 - 8.1 g/dL - - -  Total Bilirubin 0.3 - 1.2 mg/dL - - -  Alkaline Phos 38 - 126 U/L - - -  AST 15 - 41 U/L - - -  ALT 0 - 44 U/L - - -   CBC Latest Ref Rng & Units 10/24/2019 10/23/2019 10/22/2019  WBC  4.0 - 10.5 K/uL 13.0(H) 12.3(H) 12.2(H)  Hemoglobin 13.0 - 17.0 g/dL 7.4(L) 7.9(L) 7.7(L)  Hematocrit 39.0 - 52.0 % 24.3(L) 26.6(L) 25.8(L)  Platelets 150 - 400 K/uL 220 264 250   ABG    Component Value Date/Time   PHART 7.408 10/25/2019 1743   PCO2ART 37.3 10/25/2019 1743   PO2ART 102.0 11/07/2019 1743   HCO3 23.2 10/29/2019 1743   TCO2 24 10/22/2019 1743   ACIDBASEDEF 1.0 11/04/2019 1743   O2SAT 98.0 11/07/2019 1743   CBG (last 3)  Recent Labs    10/23/19 2308 10/24/19 0321 10/24/19 0737  GLUCAP 107* 125* 126*   This patient is critically ill with multiple organ system failure which requires frequent high complexity decision making, assessment, support, evaluation, and titration of therapies. This was completed through the application of advanced monitoring technologies and extensive interpretation of multiple databases. During this encounter critical care time was devoted to patient care services described in this note for 30 minutes.  Julian Hy, DO 10/29/2019 10:43 AM [facility]

## 2019-11-14 NOTE — Procedures (Signed)
Extubation Procedure Note  Patient Details:   Name: William [name] DOB: 13-Jun-1986 MRN: [id number]   Airway Documentation:    Vent end date: 11/24/19 Vent end time: 1224   Evaluation  O2 sats: stable throughout Complications: No apparent complications Patient did tolerate procedure well. Bilateral Breath Sounds: Clear, Diminished   No, pt could not speak post extubation.  Pt extubated to room air without difficulty per physician's order and family's wishes.  Earney Navy 11/24/19, 12:25 PM

## 2019-11-14 NOTE — Progress Notes (Signed)
Chaplain visited with family as they plan to withdraw care.  The chaplain informed the family that when they decide to go forward that they would be there with the family.  Brion Aliment Chaplain Resident For questions concerning this note please contact me by pager [phone]

## 2019-11-14 NOTE — Progress Notes (Signed)
Wasted 115mL of fentanyl in stericycle with Terrall Laity, RN

## 2019-11-14 DEATH — deceased

## 2019-12-18 ENCOUNTER — Ambulatory Visit: Payer: Medicaid Other | Admitting: Adult Health

## 2020-01-21 IMAGING — DX CHEST - 2 VIEW
2 series · 2 of 2 positions shown · non-contrast
Comparison: 06/21/2019

CLINICAL DATA: Shortness of breath

EXAM:
CHEST - 2 VIEW

[chest pa]
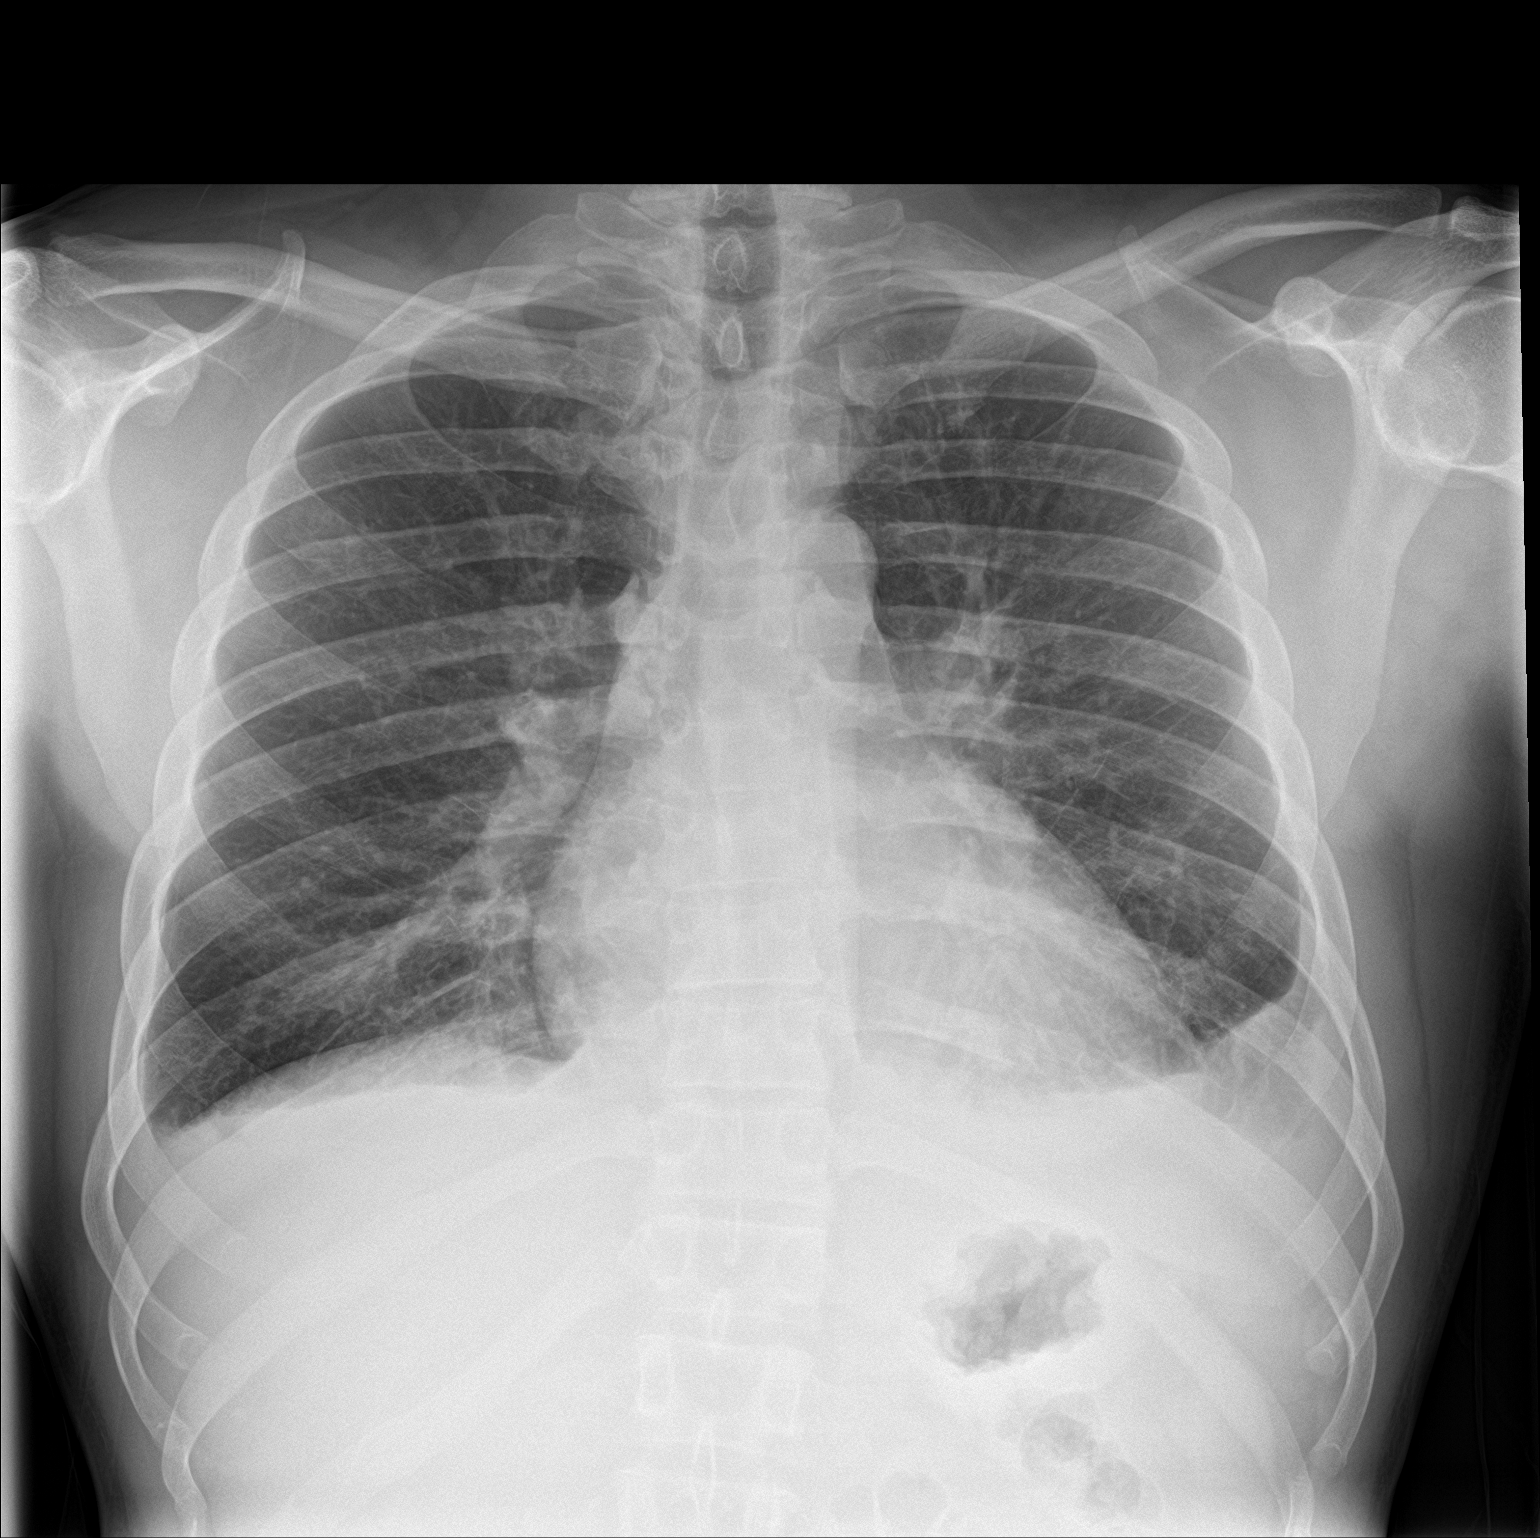

[chest lat]
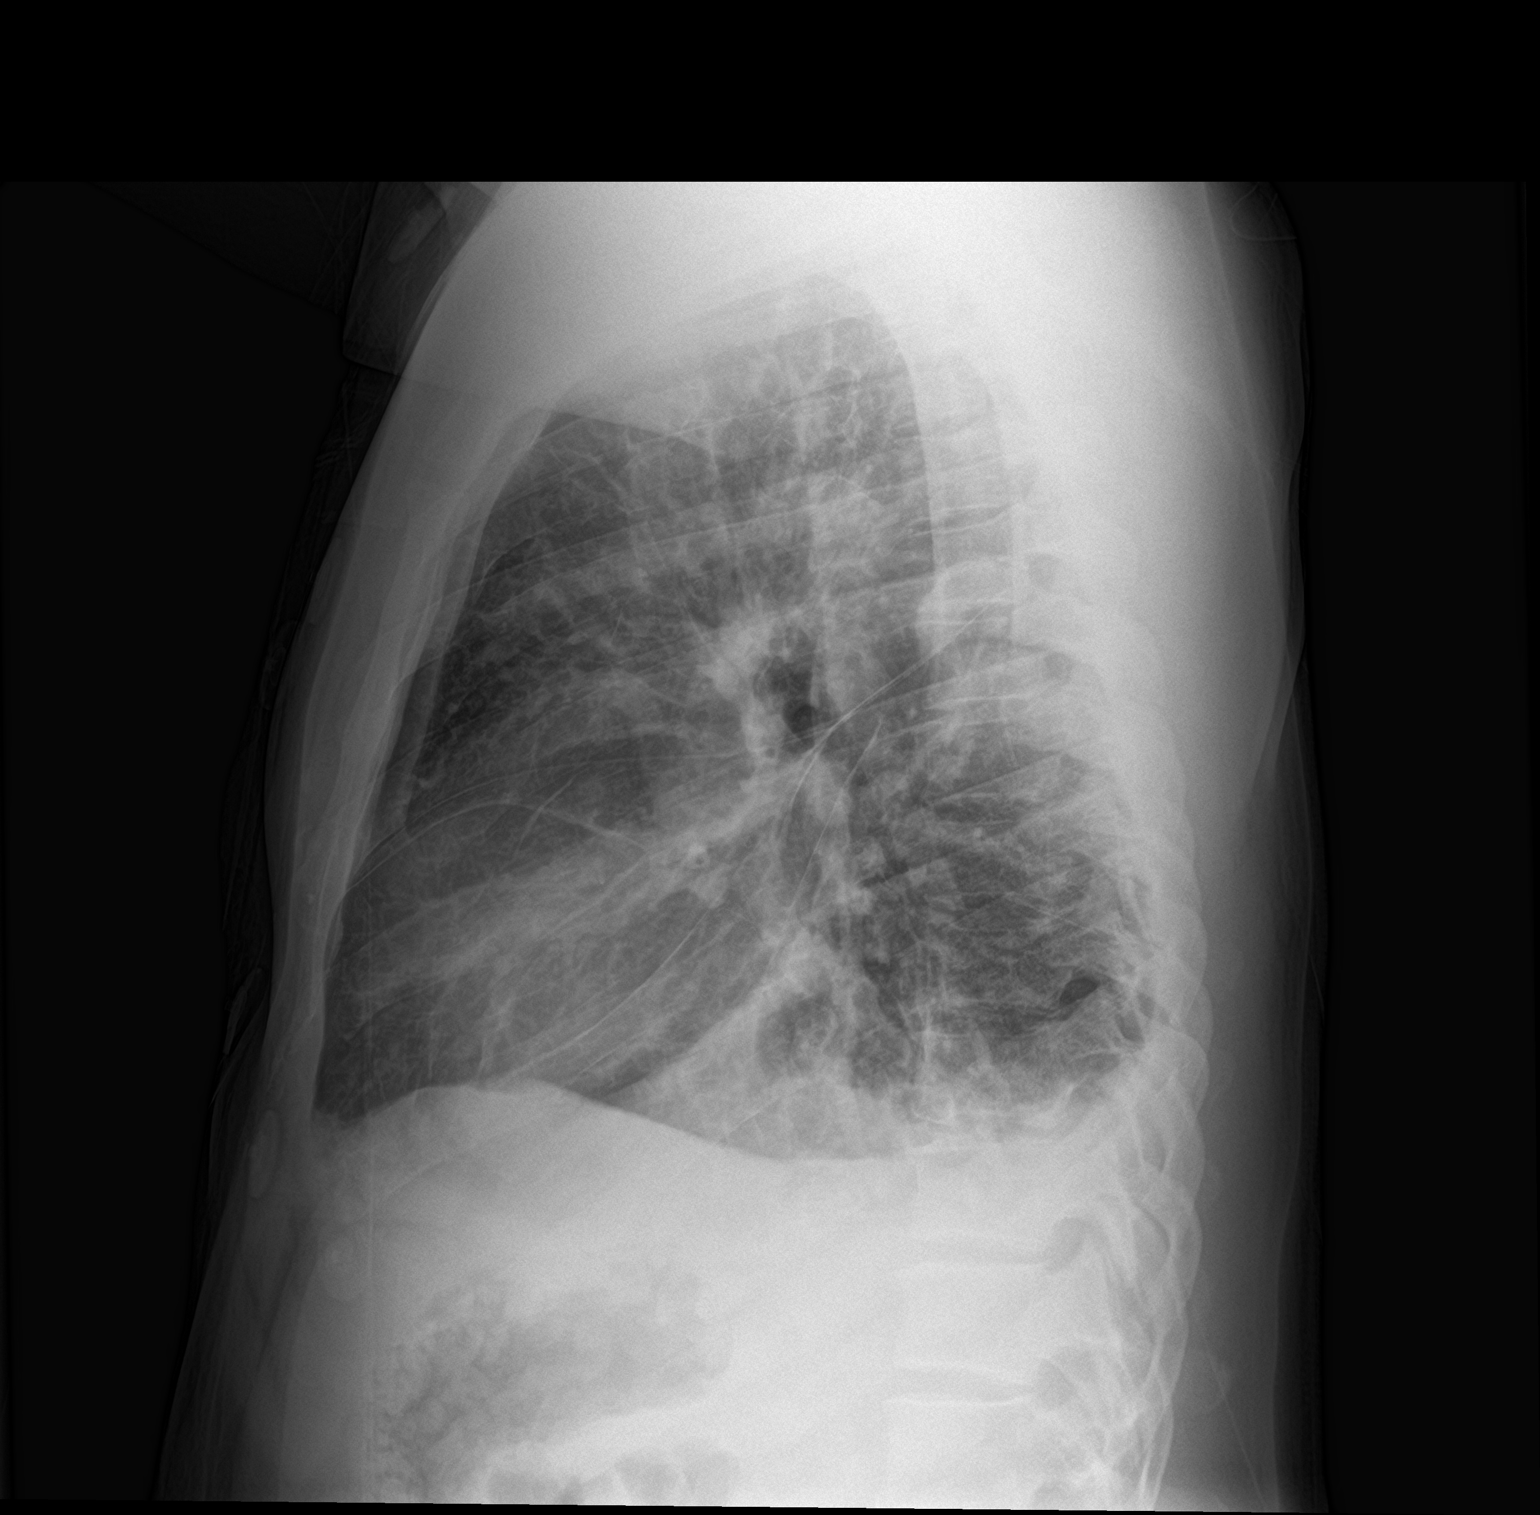

[2 of 2 positions shown; findings below may reference images not displayed]

FINDINGS: Small left pleural effusion. No focal airspace consolidation or
pulmonary edema. No pneumothorax. Normal cardiomediastinal contours.
IMPRESSION: Small left pleural effusion

## 2020-03-18 LAB — ALDOSTERONE + RENIN ACTIVITY W/ RATIO
ALDO / PRA Ratio: 0.5 (ref 0.0–30.0)
Aldosterone: 6.7 ng/dL (ref 0.0–30.0)
PRA LC/MS/MS: 13.813 ng/mL/hr — ABNORMAL HIGH (ref 0.167–5.380)

## 2020-03-27 IMAGING — CT CT HEAD W/O CM
4 series · 16 of 47 positions shown, 18 images · non-contrast
Comparison: Two days ago

CLINICAL DATA: Stroke follow-up

EXAM:
CT HEAD WITHOUT CONTRAST
TECHNIQUE: Contiguous axial images were obtained from the base of the skull
through the vertex without intravenous contrast.

[Series 3: head without · axial · non-contrast · 0.45mm/px · z∈[-140,-14]mm · 7 of 35 slices shown, 9 images]
[im 5/35  brain]
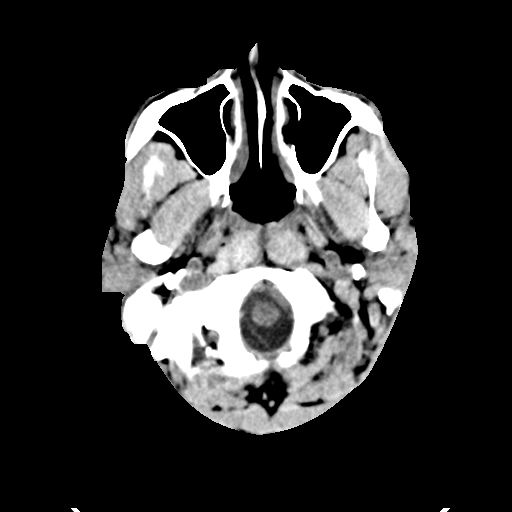
[im 5/35  bone]
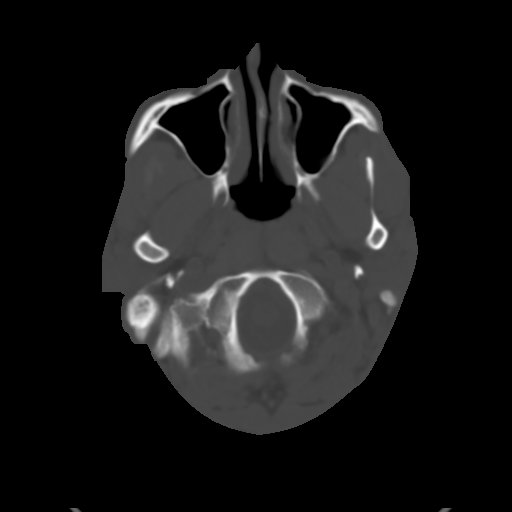
[im 9/35  brain]
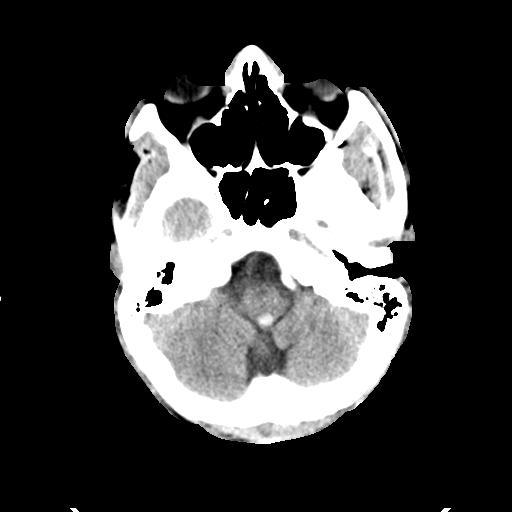
[im 13/35  brain]
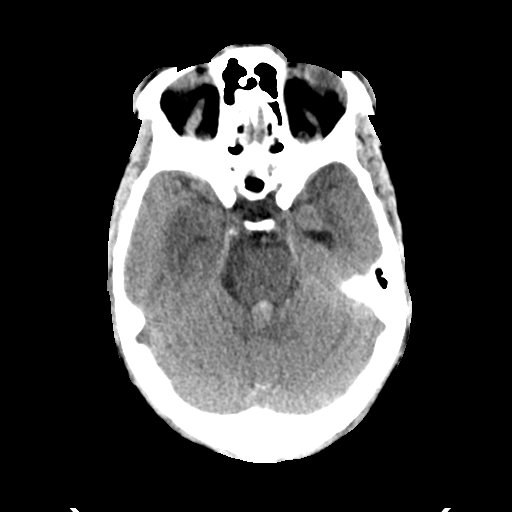
[im 18/35  brain]
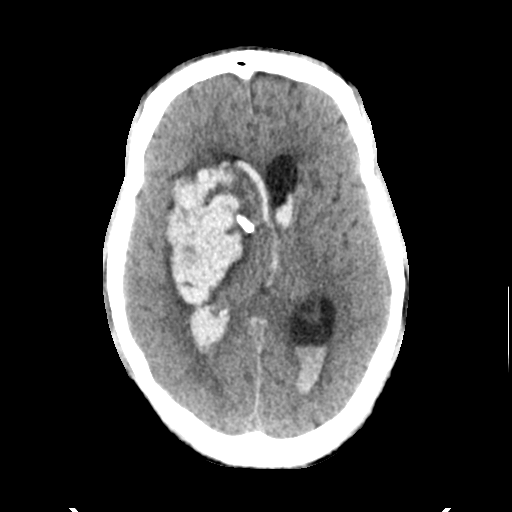
[im 22/35  brain]
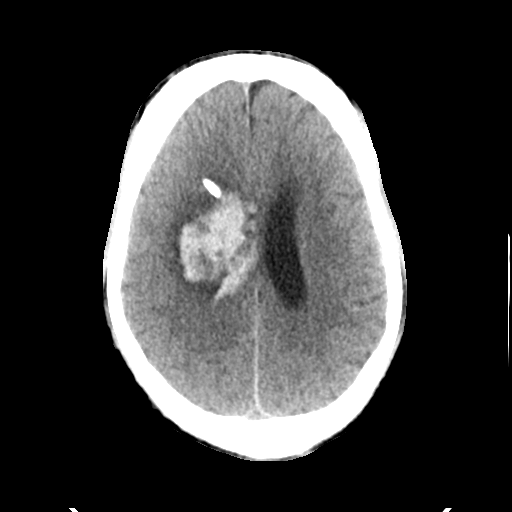
[im 22/35  bone]
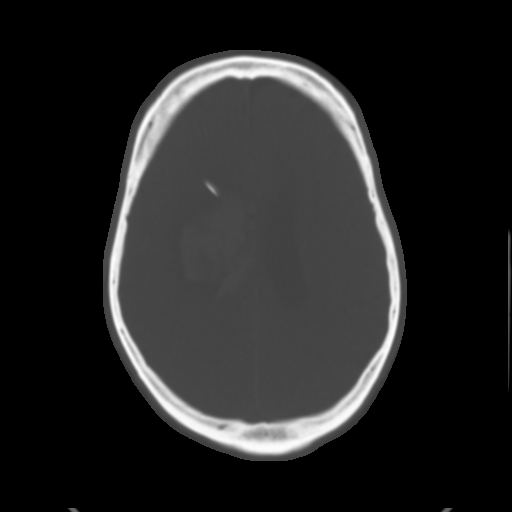
[im 26/35  brain]
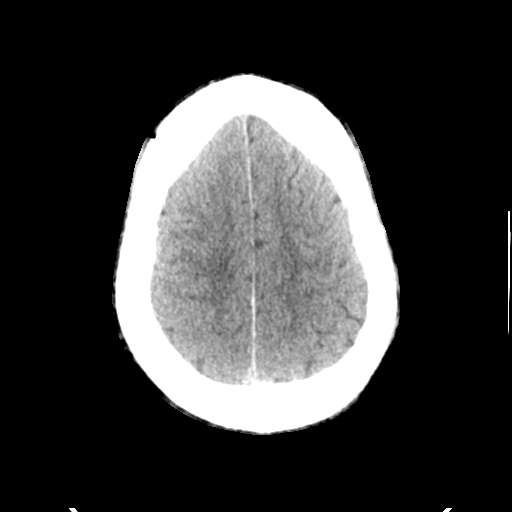
[im 30/35  brain]
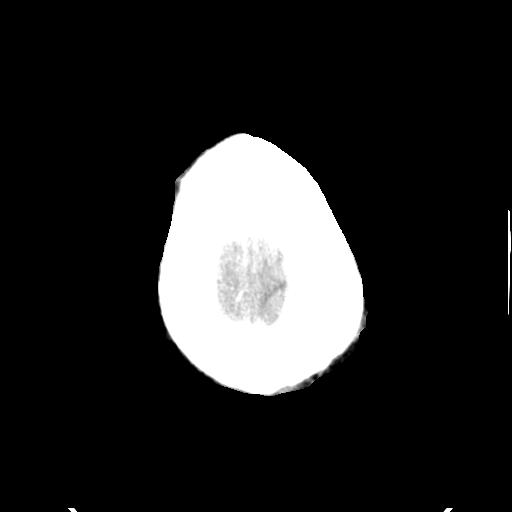

[Series 4: head bone · axial · 0.45mm/px · z∈[-144,-108]mm · 3 of 88 slices shown]
[im 9/88  bone]
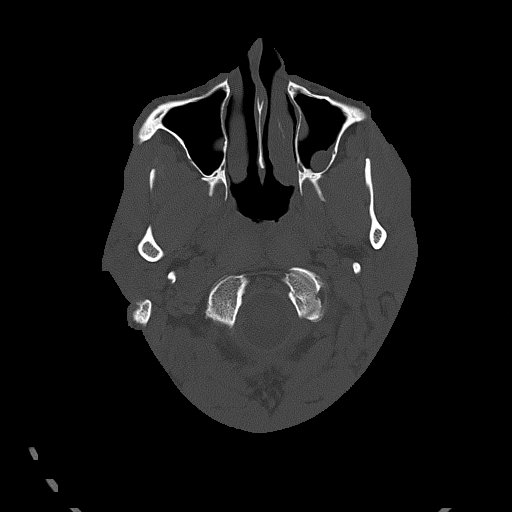
[im 18/88  bone]
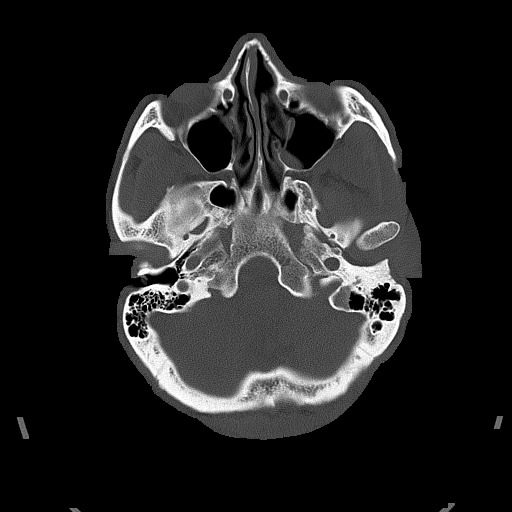
[im 27/88  bone]
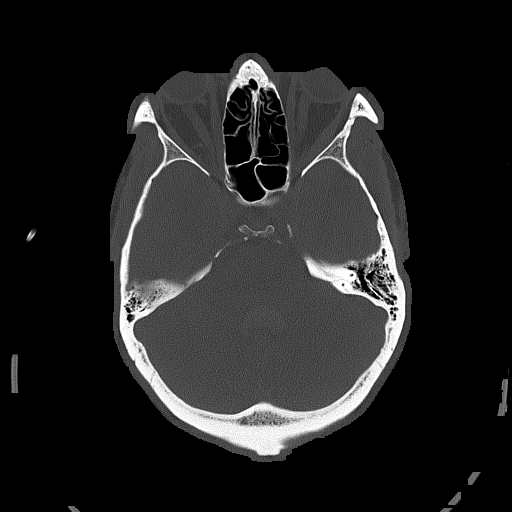

[Series 5: head without cor · coronal · non-contrast · 0.34mm/px · 3 of 74 slices shown]
[im 25/74  brain]
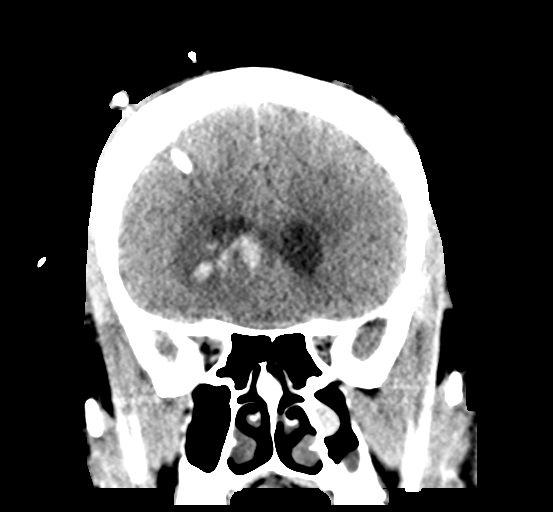
[im 33/74  brain]
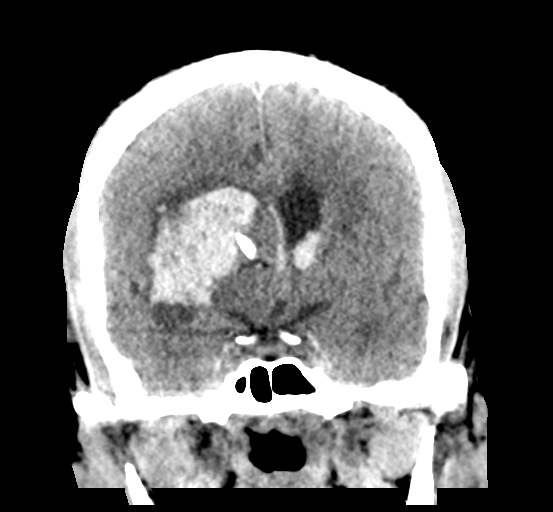
[im 41/74  brain]
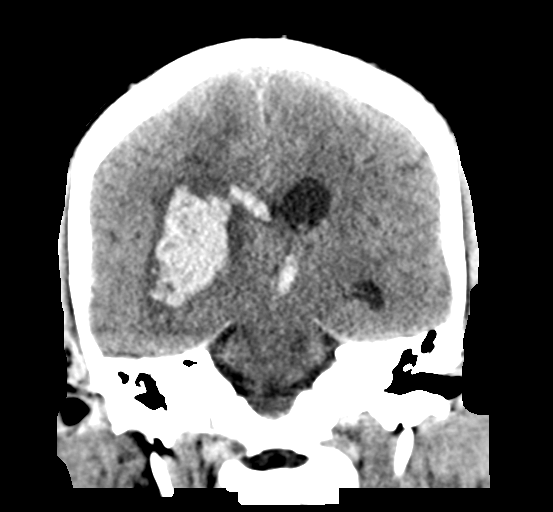

[Series 6: head without sag · sagittal · non-contrast · 0.34mm/px · 3 of 58 slices shown]
[im 20/58  brain]
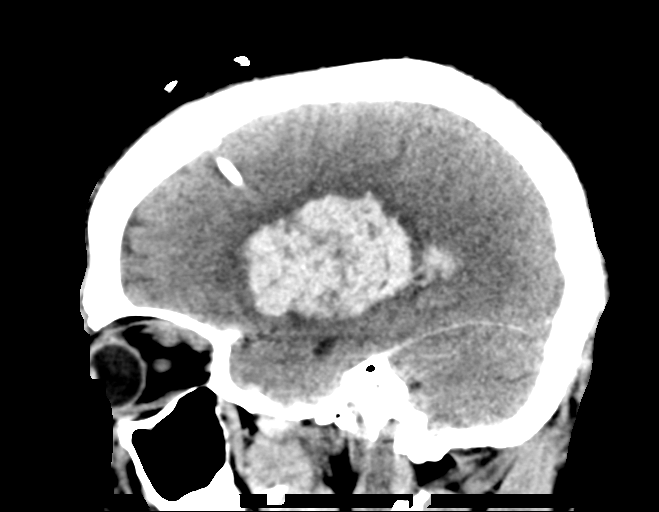
[im 29/58  brain]
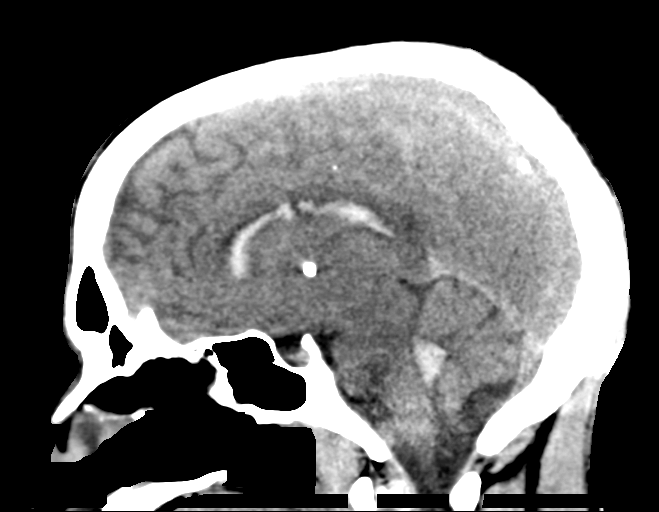
[im 39/58  brain]
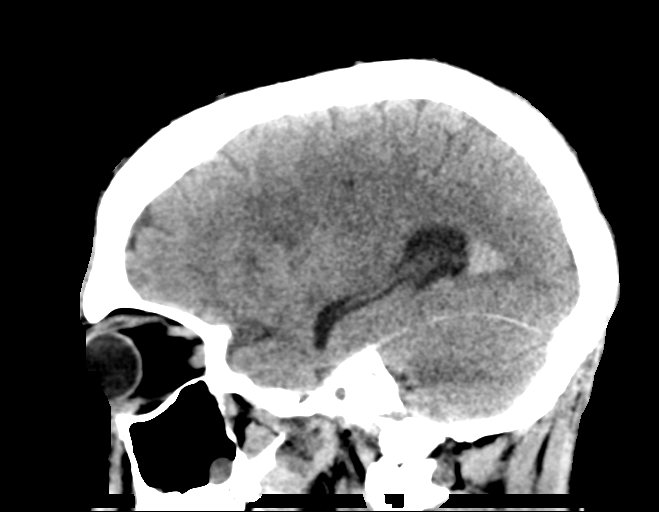

[16 of 47 positions shown; findings below may reference images not displayed]

FINDINGS: Brain: Unchanged large and heterogeneous hematoma in the right basal
ganglia with rim of edema. Unchanged intraventricular clot extending
to the level of the fourth ventricle. Right frontal ventriculostomy
in stable position with tip ending at the level of the right
anterior thalamus. Pneumocephalus was seen previously suggesting
that there is some intraventricular communication. Ventriculomegaly
is improved. Midline shift measures 1 cm, stable. Negative for
interval infarct.

Vascular: Negative

Skull: Negative

Sinuses/Orbits: Negative
IMPRESSION: 1. Unchanged right basal ganglia and intraventricular clot.
2. Improved ventriculomegaly.
3. Stable 1 cm leftward midline shift.
